# Patient Record
Sex: Male | Born: 1940
Health system: Southern US, Community
[De-identification: ages and names within clinical notes are randomized; demographics above are authoritative.]

## PROBLEM LIST (undated history)

## (undated) DIAGNOSIS — C679 Malignant neoplasm of bladder, unspecified: Secondary | ICD-10-CM

## (undated) DIAGNOSIS — I10 Essential (primary) hypertension: Secondary | ICD-10-CM

## (undated) HISTORY — PX: CATARACT EXTRACTION: SUR2

---

## 2020-07-24 DIAGNOSIS — Z23 Encounter for immunization: Secondary | ICD-10-CM | POA: Diagnosis not present

## 2020-12-02 ENCOUNTER — Ambulatory Visit (INDEPENDENT_AMBULATORY_CARE_PROVIDER_SITE_OTHER): Payer: Medicare Other | Admitting: Physician Assistant

## 2020-12-02 ENCOUNTER — Other Ambulatory Visit: Payer: Self-pay

## 2020-12-02 ENCOUNTER — Encounter: Payer: Self-pay | Admitting: Physician Assistant

## 2020-12-02 VITALS — BP 140/83 | HR 91 | Temp 97.5°F | Ht 67.0 in | Wt 155.8 lb

## 2020-12-02 DIAGNOSIS — R5383 Other fatigue: Secondary | ICD-10-CM | POA: Diagnosis not present

## 2020-12-02 DIAGNOSIS — K219 Gastro-esophageal reflux disease without esophagitis: Secondary | ICD-10-CM

## 2020-12-02 NOTE — Progress Notes (Signed)
  Subjective:     Patient ID: Dean Randolph, male   DOB: 06/01/1941, 80 y.o.   MRN: [id number]  HPI Pt here today after not seeing a provider in 20+ years Sates that around 1 month ago pt noted some stomach sensitivity Certain foods would cause him abd pain and discomfort This got to the point one night that he had sig worsening with assoc fever/chills with N/V/D Those sx resolved the next day and have improved since Now not having any GI sx at all Able to eat all of his foods without any of the prev GI sx at all Only concern is that he is still having some fatigue issues No smoking hx Caff use is 2 cups per day No OTC med use  Review of Systems  Constitutional: Positive for fatigue. Negative for activity change, appetite change and fever.  HENT: Negative.   Respiratory: Negative.   Cardiovascular: Negative.   Gastrointestinal: Negative.   Genitourinary: Negative.        Objective:   Physical Exam Vitals and nursing note reviewed.  Constitutional:      General: He is not in acute distress.    Appearance: He is normal weight. He is not ill-appearing or toxic-appearing.  HENT:     Mouth/Throat:     Mouth: Mucous membranes are moist.     Pharynx: Oropharynx is clear.  Cardiovascular:     Rate and Rhythm: Normal rate and regular rhythm.     Pulses: Normal pulses.     Heart sounds: Normal heart sounds.  Pulmonary:     Effort: Pulmonary effort is normal.     Breath sounds: Normal breath sounds.  Abdominal:     General: Abdomen is flat. There is no distension.     Palpations: Abdomen is soft. There is no mass.     Tenderness: There is no abdominal tenderness. There is no guarding or rebound.  Neurological:     Mental Status: He is alert.    CBC/CMP pending    Assessment:     1. GERD without esophagitis   2. Other fatigue        Plan:     Avoid GI irritants Since still with some fatigue labs ordered today Hydrate well F/U pending labs

## 2020-12-02 NOTE — Patient Instructions (Signed)
Gastroesophageal Reflux Disease, Adult  Gastroesophageal reflux (GER) happens when acid from the stomach flows up into the tube that connects the mouth and the stomach (esophagus). Normally, food travels down the esophagus and stays in the stomach to be digested. With GER, food and stomach acid sometimes move back up into the esophagus. You may have a disease called gastroesophageal reflux disease (GERD) if the reflux:  Happens often.  Causes frequent or very bad symptoms.  Causes problems such as damage to the esophagus. When this happens, the esophagus becomes sore and swollen. Over time, GERD can make small holes (ulcers) in the lining of the esophagus. What are the causes? This condition is caused by a problem with the muscle between the esophagus and the stomach. When this muscle is weak or not normal, it does not close properly to keep food and acid from coming back up from the stomach. The muscle can be weak because of:  Tobacco use.  Pregnancy.  Having a certain type of hernia (hiatal hernia).  Alcohol use.  Certain foods and drinks, such as coffee, chocolate, onions, and peppermint. What increases the risk?  Being overweight.  Having a disease that affects your connective tissue.  Taking NSAIDs, such a ibuprofen. What are the signs or symptoms?  Heartburn.  Difficult or painful swallowing.  The feeling of having a lump in the throat.  A bitter taste in the mouth.  Bad breath.  Having a lot of saliva.  Having an upset or bloated stomach.  Burping.  Chest pain. Different conditions can cause chest pain. Make sure you see your doctor if you have chest pain.  Shortness of breath or wheezing.  A long-term cough or a cough at night.  Wearing away of the surface of teeth (tooth enamel).  Weight loss. How is this treated?  Making changes to your diet.  Taking medicine.  Having surgery. Treatment will depend on how bad your symptoms are. Follow these  instructions at home: Eating and drinking  Follow a diet as told by your doctor. You may need to avoid foods and drinks such as: ? Coffee and tea, with or without caffeine. ? Drinks that contain alcohol. ? Energy drinks and sports drinks. ? Bubbly (carbonated) drinks or sodas. ? Chocolate and cocoa. ? Peppermint and mint flavorings. ? Garlic and onions. ? Horseradish. ? Spicy and acidic foods. These include peppers, chili powder, curry powder, vinegar, hot sauces, and BBQ sauce. ? Citrus fruit juices and citrus fruits, such as oranges, lemons, and limes. ? Tomato-based foods. These include red sauce, chili, salsa, and pizza with red sauce. ? Fried and fatty foods. These include donuts, french fries, potato chips, and high-fat dressings. ? High-fat meats. These include hot dogs, rib eye steak, sausage, ham, and bacon. ? High-fat dairy items, such as whole milk, butter, and cream cheese.  Eat small meals often. Avoid eating large meals.  Avoid drinking large amounts of liquid with your meals.  Avoid eating meals during the 2-3 hours before bedtime.  Avoid lying down right after you eat.  Do not exercise right after you eat.   Lifestyle  Do not smoke or use any products that contain nicotine or tobacco. If you need help quitting, ask your doctor.  Try to lower your stress. If you need help doing this, ask your doctor.  If you are overweight, lose an amount of weight that is healthy for you. Ask your doctor about a safe weight loss goal.   General instructions    Pay attention to any changes in your symptoms.  Take over-the-counter and prescription medicines only as told by your doctor.  Do not take aspirin, ibuprofen, or other NSAIDs unless your doctor says it is okay.  Wear loose clothes. Do not wear anything tight around your waist.  Raise (elevate) the head of your bed about 6 inches (15 cm). You may need to use a wedge to do this.  Avoid bending over if this makes your  symptoms worse.  Keep all follow-up visits. Contact a doctor if:  You have new symptoms.  You lose weight and you do not know why.  You have trouble swallowing or it hurts to swallow.  You have wheezing or a cough that keeps happening.  You have a hoarse voice.  Your symptoms do not get better with treatment. Get help right away if:  You have sudden pain in your arms, neck, jaw, teeth, or back.  You suddenly feel sweaty, dizzy, or light-headed.  You have chest pain or shortness of breath.  You vomit and the vomit is green, yellow, or black, or it looks like blood or coffee grounds.  You faint.  Your poop (stool) is red, bloody, or black.  You cannot swallow, drink, or eat. These symptoms may represent a serious problem that is an emergency. Do not wait to see if the symptoms will go away. Get medical help right away. Call your local emergency services (911 in the [place] Do not drive yourself to the hospital. Summary  If a person has gastroesophageal reflux disease (GERD), food and stomach acid move back up into the esophagus and cause symptoms or problems such as damage to the esophagus.  Treatment will depend on how bad your symptoms are.  Follow a diet as told by your doctor.  Take all medicines only as told by your doctor. This information is not intended to replace advice given to you by your health care provider. Make sure you discuss any questions you have with your health care provider. Document Revised: 12/25/2019 Document Reviewed: 12/25/2019 Elsevier Patient Education  2021 [employer]  

## 2020-12-03 LAB — CBC WITH DIFFERENTIAL/PLATELET
Basophils Absolute: 0.1 10*3/uL (ref 0.0–0.2)
Basos: 1 %
EOS (ABSOLUTE): 0 10*3/uL (ref 0.0–0.4)
Eos: 1 %
Hematocrit: 31 % — ABNORMAL LOW (ref 37.5–51.0)
Hemoglobin: 10.6 g/dL — ABNORMAL LOW (ref 13.0–17.7)
Immature Grans (Abs): 0 10*3/uL (ref 0.0–0.1)
Immature Granulocytes: 0 %
Lymphocytes Absolute: 1.1 10*3/uL (ref 0.7–3.1)
Lymphs: 17 %
MCH: 30.1 pg (ref 26.6–33.0)
MCHC: 34.2 g/dL (ref 31.5–35.7)
MCV: 88 fL (ref 79–97)
Monocytes Absolute: 0.5 10*3/uL (ref 0.1–0.9)
Monocytes: 7 %
NRBC: 2 % — ABNORMAL HIGH (ref 0–0)
Neutrophils Absolute: 4.8 10*3/uL (ref 1.4–7.0)
Neutrophils: 74 %
Platelets: 356 10*3/uL (ref 150–450)
RBC: 3.52 x10E6/uL — ABNORMAL LOW (ref 4.14–5.80)
RDW: 12 % (ref 11.6–15.4)
WBC: 6.5 10*3/uL (ref 3.4–10.8)

## 2020-12-03 LAB — CMP14+EGFR
ALT: 117 IU/L — ABNORMAL HIGH (ref 0–44)
AST: 82 IU/L — ABNORMAL HIGH (ref 0–40)
Albumin/Globulin Ratio: 0.7 — ABNORMAL LOW (ref 1.2–2.2)
Albumin: 3 g/dL — ABNORMAL LOW (ref 3.7–4.7)
Alkaline Phosphatase: 885 IU/L — ABNORMAL HIGH (ref 44–121)
BUN/Creatinine Ratio: 19 (ref 10–24)
BUN: 20 mg/dL (ref 8–27)
Bilirubin Total: 3.7 mg/dL — ABNORMAL HIGH (ref 0.0–1.2)
CO2: 20 mmol/L (ref 20–29)
Calcium: 8.8 mg/dL (ref 8.6–10.2)
Chloride: 99 mmol/L (ref 96–106)
Creatinine, Ser: 1.03 mg/dL (ref 0.76–1.27)
Globulin, Total: 4.6 g/dL — ABNORMAL HIGH (ref 1.5–4.5)
Glucose: 143 mg/dL — ABNORMAL HIGH (ref 65–99)
Potassium: 3.9 mmol/L (ref 3.5–5.2)
Sodium: 135 mmol/L (ref 134–144)
Total Protein: 7.6 g/dL (ref 6.0–8.5)
eGFR: 74 mL/min/{1.73_m2} (ref >59)

## 2020-12-04 ENCOUNTER — Telehealth: Payer: Self-pay | Admitting: Physician Assistant

## 2020-12-04 ENCOUNTER — Other Ambulatory Visit: Payer: Self-pay | Admitting: Family Medicine

## 2020-12-04 DIAGNOSIS — D649 Anemia, unspecified: Secondary | ICD-10-CM

## 2020-12-04 DIAGNOSIS — R7989 Other specified abnormal findings of blood chemistry: Secondary | ICD-10-CM

## 2020-12-04 DIAGNOSIS — R739 Hyperglycemia, unspecified: Secondary | ICD-10-CM

## 2020-12-04 NOTE — Progress Notes (Signed)
Patient's blood sugar is elevated at 143, his liver function is also elevated across the board, I do not know if this is chronic or new because I do not have any labs to compare it to.  Patient's hemoglobin is also slightly down at 10.6 but not significantly, again I do not have any labs to compare to previously.  Placed orders for patient to come back in sometime either at the end of this week or early next week to recheck liver function A1c and hemoglobin Arville Care, MD [facility] 12/04/2020, 12:53 PM

## 2020-12-04 NOTE — Telephone Encounter (Signed)
Please review lab results for patient.  Patient seen Dean Randolph.

## 2020-12-11 ENCOUNTER — Other Ambulatory Visit: Payer: Self-pay

## 2020-12-11 ENCOUNTER — Other Ambulatory Visit: Payer: Medicare Other

## 2020-12-11 DIAGNOSIS — R739 Hyperglycemia, unspecified: Secondary | ICD-10-CM

## 2020-12-11 DIAGNOSIS — D649 Anemia, unspecified: Secondary | ICD-10-CM

## 2020-12-11 DIAGNOSIS — R7989 Other specified abnormal findings of blood chemistry: Secondary | ICD-10-CM | POA: Diagnosis not present

## 2020-12-11 LAB — BAYER DCA HB A1C WAIVED: HB A1C (BAYER DCA - WAIVED): 5.3 % (ref <7.0)

## 2020-12-12 LAB — HEPATIC FUNCTION PANEL
ALT: 101 IU/L — ABNORMAL HIGH (ref 0–44)
AST: 107 IU/L — ABNORMAL HIGH (ref 0–40)
Albumin: 3.7 g/dL (ref 3.7–4.7)
Alkaline Phosphatase: 674 IU/L — ABNORMAL HIGH (ref 44–121)
Bilirubin Total: 1.6 mg/dL — ABNORMAL HIGH (ref 0.0–1.2)
Bilirubin, Direct: 1.1 mg/dL — ABNORMAL HIGH (ref 0.00–0.40)
Total Protein: 7.7 g/dL (ref 6.0–8.5)

## 2020-12-12 LAB — CBC WITH DIFFERENTIAL/PLATELET
Basophils Absolute: 0.1 10*3/uL (ref 0.0–0.2)
Basos: 1 %
EOS (ABSOLUTE): 0.2 10*3/uL (ref 0.0–0.4)
Eos: 2 %
Hematocrit: 36.7 % — ABNORMAL LOW (ref 37.5–51.0)
Hemoglobin: 11.9 g/dL — ABNORMAL LOW (ref 13.0–17.7)
Immature Grans (Abs): 0 10*3/uL (ref 0.0–0.1)
Immature Granulocytes: 0 %
Lymphocytes Absolute: 1.8 10*3/uL (ref 0.7–3.1)
Lymphs: 26 %
MCH: 30.5 pg (ref 26.6–33.0)
MCHC: 32.4 g/dL (ref 31.5–35.7)
MCV: 94 fL (ref 79–97)
Monocytes Absolute: 0.5 10*3/uL (ref 0.1–0.9)
Monocytes: 7 %
Neutrophils Absolute: 4.4 10*3/uL (ref 1.4–7.0)
Neutrophils: 64 %
Platelets: 335 10*3/uL (ref 150–450)
RBC: 3.9 x10E6/uL — ABNORMAL LOW (ref 4.14–5.80)
RDW: 13.6 % (ref 11.6–15.4)
WBC: 7 10*3/uL (ref 3.4–10.8)

## 2020-12-19 ENCOUNTER — Other Ambulatory Visit: Payer: Self-pay

## 2020-12-19 DIAGNOSIS — R7989 Other specified abnormal findings of blood chemistry: Secondary | ICD-10-CM

## 2020-12-31 ENCOUNTER — Ambulatory Visit (HOSPITAL_COMMUNITY)
Admission: RE | Admit: 2020-12-31 | Discharge: 2020-12-31 | Disposition: A | Discharge status: Home / Self Care | Payer: Medicare Other | Source: Ambulatory Visit | Attending: Family Medicine | Admitting: Family Medicine

## 2020-12-31 DIAGNOSIS — K760 Fatty (change of) liver, not elsewhere classified: Secondary | ICD-10-CM | POA: Diagnosis not present

## 2020-12-31 DIAGNOSIS — R7989 Other specified abnormal findings of blood chemistry: Secondary | ICD-10-CM | POA: Insufficient documentation

## 2021-01-27 ENCOUNTER — Telehealth: Payer: Self-pay | Admitting: Family Medicine

## 2021-01-27 NOTE — Telephone Encounter (Signed)
Spoke with patient's daughter and explained to her that I could not go over any medical details of her dad's chart because she was not on his HIPPA form.  Daughter explains that she has spoken with her father and he is wanting to go to GI doctor or whatever he needs to do.  She feels that he is getting worse and needs to be seen as quickly as possible.  Daughter would like referral placed to whatever he needs STAT.

## 2021-01-28 ENCOUNTER — Other Ambulatory Visit: Payer: Self-pay | Admitting: Family Medicine

## 2021-01-28 DIAGNOSIS — R7989 Other specified abnormal findings of blood chemistry: Secondary | ICD-10-CM

## 2021-01-28 NOTE — Telephone Encounter (Signed)
Patient came in to the office to discuss his referral with gastroenterologist.  I explained to patient that he would be contacted by the gastroenterologist office with an appointment.  Patient states he would like to go to [place].    Patient also updated his DPR to include his daughter, Dorian Heckle.

## 2021-01-29 ENCOUNTER — Encounter: Payer: Self-pay | Admitting: Internal Medicine

## 2021-01-29 NOTE — Telephone Encounter (Signed)
Referral placed, as requested WS 

## 2021-02-02 NOTE — Addendum Note (Signed)
Encounter addended by: Novella Olive on: 02/02/2021 12:26 PM  Actions taken: Letter saved

## 2021-02-04 ENCOUNTER — Encounter (HOSPITAL_COMMUNITY): Payer: Self-pay

## 2021-02-04 ENCOUNTER — Emergency Department (HOSPITAL_COMMUNITY): Payer: Medicare Other

## 2021-02-04 ENCOUNTER — Other Ambulatory Visit: Payer: Self-pay

## 2021-02-04 ENCOUNTER — Inpatient Hospital Stay (HOSPITAL_COMMUNITY)
Admission: EM | Admit: 2021-02-04 | Discharge: 2021-02-07 | DRG: 445 | Disposition: A | Discharge status: Home / Self Care | Payer: Medicare Other | Attending: Internal Medicine | Admitting: Internal Medicine

## 2021-02-04 DIAGNOSIS — K8031 Calculus of bile duct with cholangitis, unspecified, with obstruction: Secondary | ICD-10-CM | POA: Diagnosis not present

## 2021-02-04 DIAGNOSIS — R634 Abnormal weight loss: Secondary | ICD-10-CM | POA: Diagnosis not present

## 2021-02-04 DIAGNOSIS — R531 Weakness: Secondary | ICD-10-CM | POA: Diagnosis not present

## 2021-02-04 DIAGNOSIS — K760 Fatty (change of) liver, not elsewhere classified: Secondary | ICD-10-CM | POA: Diagnosis not present

## 2021-02-04 DIAGNOSIS — K838 Other specified diseases of biliary tract: Secondary | ICD-10-CM | POA: Diagnosis not present

## 2021-02-04 DIAGNOSIS — D539 Nutritional anemia, unspecified: Secondary | ICD-10-CM | POA: Diagnosis present

## 2021-02-04 DIAGNOSIS — N281 Cyst of kidney, acquired: Secondary | ICD-10-CM | POA: Diagnosis not present

## 2021-02-04 DIAGNOSIS — K8309 Other cholangitis: Secondary | ICD-10-CM

## 2021-02-04 DIAGNOSIS — K8051 Calculus of bile duct without cholangitis or cholecystitis with obstruction: Secondary | ICD-10-CM | POA: Diagnosis not present

## 2021-02-04 DIAGNOSIS — K8071 Calculus of gallbladder and bile duct without cholecystitis with obstruction: Principal | ICD-10-CM | POA: Diagnosis present

## 2021-02-04 DIAGNOSIS — R109 Unspecified abdominal pain: Secondary | ICD-10-CM | POA: Diagnosis not present

## 2021-02-04 DIAGNOSIS — K920 Hematemesis: Secondary | ICD-10-CM | POA: Diagnosis not present

## 2021-02-04 DIAGNOSIS — R748 Abnormal levels of other serum enzymes: Secondary | ICD-10-CM | POA: Diagnosis not present

## 2021-02-04 DIAGNOSIS — R6881 Early satiety: Secondary | ICD-10-CM | POA: Diagnosis present

## 2021-02-04 DIAGNOSIS — Z20822 Contact with and (suspected) exposure to covid-19: Secondary | ICD-10-CM | POA: Diagnosis not present

## 2021-02-04 DIAGNOSIS — D509 Iron deficiency anemia, unspecified: Secondary | ICD-10-CM | POA: Diagnosis not present

## 2021-02-04 DIAGNOSIS — R7401 Elevation of levels of liver transaminase levels: Secondary | ICD-10-CM | POA: Insufficient documentation

## 2021-02-04 DIAGNOSIS — I10 Essential (primary) hypertension: Secondary | ICD-10-CM

## 2021-02-04 DIAGNOSIS — K8050 Calculus of bile duct without cholangitis or cholecystitis without obstruction: Secondary | ICD-10-CM | POA: Diagnosis not present

## 2021-02-04 DIAGNOSIS — R739 Hyperglycemia, unspecified: Secondary | ICD-10-CM | POA: Diagnosis not present

## 2021-02-04 DIAGNOSIS — K8020 Calculus of gallbladder without cholecystitis without obstruction: Secondary | ICD-10-CM

## 2021-02-04 DIAGNOSIS — R52 Pain, unspecified: Secondary | ICD-10-CM

## 2021-02-04 DIAGNOSIS — R17 Unspecified jaundice: Secondary | ICD-10-CM | POA: Diagnosis not present

## 2021-02-04 LAB — CBC WITH DIFFERENTIAL/PLATELET
Abs Immature Granulocytes: 0.03 10*3/uL (ref 0.00–0.07)
Basophils Absolute: 0 10*3/uL (ref 0.0–0.1)
Basophils Relative: 0 %
Eosinophils Absolute: 0 10*3/uL (ref 0.0–0.5)
Eosinophils Relative: 0 %
HCT: 33.6 % — ABNORMAL LOW (ref 39.0–52.0)
Hemoglobin: 12.4 g/dL — ABNORMAL LOW (ref 13.0–17.0)
Immature Granulocytes: 0 %
Lymphocytes Relative: 8 %
Lymphs Abs: 0.8 10*3/uL (ref 0.7–4.0)
MCH: 39.5 pg — ABNORMAL HIGH (ref 26.0–34.0)
MCHC: 36.9 g/dL — ABNORMAL HIGH (ref 30.0–36.0)
MCV: 107 fL — ABNORMAL HIGH (ref 80.0–100.0)
Monocytes Absolute: 0.7 10*3/uL (ref 0.1–1.0)
Monocytes Relative: 7 %
Neutro Abs: 8.2 10*3/uL — ABNORMAL HIGH (ref 1.7–7.7)
Neutrophils Relative %: 85 %
Platelets: 223 10*3/uL (ref 150–400)
RBC: 3.14 MIL/uL — ABNORMAL LOW (ref 4.22–5.81)
RDW: 18.6 % — ABNORMAL HIGH (ref 11.5–15.5)
WBC: 9.7 10*3/uL (ref 4.0–10.5)
nRBC: 0 % (ref 0.0–0.2)

## 2021-02-04 LAB — IRON AND TIBC
Iron: 15 ug/dL — ABNORMAL LOW (ref 45–182)
Saturation Ratios: 6 % — ABNORMAL LOW (ref 17.9–39.5)
TIBC: 264 ug/dL (ref 250–450)
UIBC: 249 ug/dL

## 2021-02-04 LAB — COMPREHENSIVE METABOLIC PANEL
ALT: 192 U/L — ABNORMAL HIGH (ref 0–44)
AST: 273 U/L — ABNORMAL HIGH (ref 15–41)
Albumin: 2.9 g/dL — ABNORMAL LOW (ref 3.5–5.0)
Alkaline Phosphatase: 740 U/L — ABNORMAL HIGH (ref 38–126)
Anion gap: 7 (ref 5–15)
BUN: 20 mg/dL (ref 8–23)
CO2: 27 mmol/L (ref 22–32)
Calcium: 8.8 mg/dL — ABNORMAL LOW (ref 8.9–10.3)
Chloride: 101 mmol/L (ref 98–111)
Creatinine, Ser: 0.82 mg/dL (ref 0.61–1.24)
GFR, Estimated: >60 mL/min (ref >60)
Glucose, Bld: 138 mg/dL — ABNORMAL HIGH (ref 70–99)
Potassium: 3.6 mmol/L (ref 3.5–5.1)
Sodium: 135 mmol/L (ref 135–145)
Total Bilirubin: 7.4 mg/dL — ABNORMAL HIGH (ref 0.3–1.2)
Total Protein: 7.9 g/dL (ref 6.5–8.1)

## 2021-02-04 LAB — RESP PANEL BY RT-PCR (FLU A&B, COVID) ARPGX2
Influenza A by PCR: NEGATIVE
Influenza B by PCR: NEGATIVE
SARS Coronavirus 2 by RT PCR: NEGATIVE

## 2021-02-04 LAB — FERRITIN: Ferritin: 275 ng/mL (ref 24–336)

## 2021-02-04 LAB — PROTIME-INR
INR: 1 (ref 0.8–1.2)
Prothrombin Time: 13.5 seconds (ref 11.4–15.2)

## 2021-02-04 LAB — FOLATE: Folate: 17.4 ng/mL (ref >5.9)

## 2021-02-04 LAB — LIPASE, BLOOD: Lipase: 24 U/L (ref 11–51)

## 2021-02-04 LAB — RETICULOCYTES
Immature Retic Fract: 10.6 % (ref 2.3–15.9)
RBC.: 3.73 MIL/uL — ABNORMAL LOW (ref 4.22–5.81)
Retic Count, Absolute: 71.6 10*3/uL (ref 19.0–186.0)
Retic Ct Pct: 1.9 % (ref 0.4–3.1)

## 2021-02-04 LAB — VITAMIN B12: Vitamin B-12: 356 pg/mL (ref 180–914)

## 2021-02-04 MED ORDER — ONDANSETRON HCL 4 MG/2ML IJ SOLN: 4.0000 mg | Freq: Four times a day (QID) | INTRAMUSCULAR | Status: DC | PRN

## 2021-02-04 MED ORDER — GADOBUTROL 1 MMOL/ML IV SOLN
7.0000 mL | Freq: Once | INTRAVENOUS | Status: AC | PRN
  Administered 2021-02-04: 7 mL via INTRAVENOUS

## 2021-02-04 MED ORDER — PANTOPRAZOLE SODIUM 40 MG IV SOLR
40.0000 mg | INTRAVENOUS | Status: DC
  Administered 2021-02-04: 40 mg via INTRAVENOUS
  Filled 2021-02-04: qty 40

## 2021-02-04 MED ORDER — ONDANSETRON HCL 4 MG PO TABS: 4.0000 mg | ORAL_TABLET | Freq: Four times a day (QID) | ORAL | Status: DC | PRN

## 2021-02-04 MED ORDER — BOOST / RESOURCE BREEZE PO LIQD CUSTOM
1.0000 | Freq: Three times a day (TID) | ORAL | Status: DC
  Administered 2021-02-04 – 2021-02-07 (×5): 1 via ORAL

## 2021-02-04 MED ORDER — ACETAMINOPHEN 325 MG PO TABS: 650.0000 mg | ORAL_TABLET | Freq: Four times a day (QID) | ORAL | Status: DC | PRN

## 2021-02-04 MED ORDER — SODIUM CHLORIDE 0.9 % IV SOLN: INTRAVENOUS | Status: DC

## 2021-02-04 MED ORDER — FENTANYL CITRATE PF 50 MCG/ML IJ SOSY: 12.5000 ug | PREFILLED_SYRINGE | INTRAMUSCULAR | Status: DC | PRN

## 2021-02-04 MED ORDER — ACETAMINOPHEN 650 MG RE SUPP: 650.0000 mg | Freq: Four times a day (QID) | RECTAL | Status: DC | PRN

## 2021-02-04 NOTE — ED Provider Notes (Signed)
[facility] EMERGENCY DEPARTMENT Provider Note  CSN: [id number] Arrival date & time: 02/04/21 1884    History Chief Complaint  Patient presents with   Hematemesis    FARAH BENISH is a 80 y.o. male with no known medical problems went to see a PCP for the first time in 20+ years on June 6 this year for intermittent abdominal pain and general weakness. He had labs done showing mild anemia and elevated liver enzymes. Labs were rechecked a few weeks later with minimal change and he was sent for an [place] on July 5 showing dilated CBD. He was referred to GI but his appointment is not until November. He presents today for evaluation because he vomited again once last night with small amount of blood mixed with food. He reports poor appetite and weight loss. He denies any hematochezia or melena. Does not take medications. Never used EtOH or tobacco.    History reviewed. No pertinent past medical history.  History reviewed. No pertinent surgical history.  History reviewed. No pertinent family history.  Social History   Tobacco Use   Smoking status: Never    Passive exposure: Past   Smokeless tobacco: Never  Substance Use Topics   Alcohol use: Never   Drug use: Never     Home Medications Prior to Admission medications   Medication Sig Start Date End Date Taking? Authorizing Provider  acetaminophen (TYLENOL) 325 MG tablet Take 650 mg by mouth every 6 (six) hours as needed for headache or mild pain.   Yes [provider]     Allergies    Patient has no known allergies.   Review of Systems   Review of Systems A comprehensive review of systems was completed and negative except as noted in HPI.    Physical Exam BP 116/66   Pulse 76   Temp 97.7 F (36.5 C) (Oral)   Resp 18   Ht 5\' 7"  (1.702 m)   Wt 74.8 kg   SpO2 98%   BMI 25.84 kg/m   Physical Exam Vitals and nursing note reviewed.  Constitutional:      Appearance: Normal appearance.  HENT:     Head:  Normocephalic and atraumatic.     Nose: Nose normal.     Mouth/Throat:     Mouth: Mucous membranes are moist.  Eyes:     General: Scleral icterus (mild) present.     Extraocular Movements: Extraocular movements intact.     Conjunctiva/sclera: Conjunctivae normal.  Cardiovascular:     Rate and Rhythm: Normal rate.  Pulmonary:     Effort: Pulmonary effort is normal.     Breath sounds: Normal breath sounds.  Abdominal:     General: Abdomen is flat.     Palpations: Abdomen is soft.     Tenderness: There is no abdominal tenderness.  Musculoskeletal:        General: No swelling. Normal range of motion.     Cervical back: Neck supple.  Skin:    General: Skin is warm and dry.  Neurological:     General: No focal deficit present.     Mental Status: He is alert.  Psychiatric:        Mood and Affect: Mood normal.     ED Results / Procedures / Treatments   Labs (all labs ordered are listed, but only abnormal results are displayed) Labs Reviewed  COMPREHENSIVE METABOLIC PANEL - Abnormal; Notable for the following components:      Result Value  Glucose, Bld 138 (*)    Calcium 8.8 (*)    Albumin 2.9 (*)    AST 273 (*)    ALT 192 (*)    Alkaline Phosphatase 740 (*)    Total Bilirubin 7.4 (*)    All other components within normal limits  CBC WITH DIFFERENTIAL/PLATELET - Abnormal; Notable for the following components:   RBC 3.14 (*)    Hemoglobin 12.4 (*)    HCT 33.6 (*)    MCV 107.0 (*)    MCH 39.5 (*)    MCHC 36.9 (*)    RDW 18.6 (*)    Neutro Abs 8.2 (*)    All other components within normal limits  IRON AND TIBC - Abnormal; Notable for the following components:   Iron 15 (*)    Saturation Ratios 6 (*)    All other components within normal limits  RETICULOCYTES - Abnormal; Notable for the following components:   RBC. 3.73 (*)    All other components within normal limits  RESP PANEL BY RT-PCR (FLU A&B, COVID) ARPGX2  LIPASE, BLOOD  PROTIME-INR  VITAMIN B12  FOLATE   FERRITIN  TYPE AND SCREEN    EKG None  Radiology MR 3D Recon At Scanner  Result Date: 02/04/2021 CLINICAL DATA:  Jaundice, upper abdominal pain, abnormal ultrasound, malignancy suspected EXAM: MRI ABDOMEN WITHOUT AND WITH CONTRAST (INCLUDING MRCP) TECHNIQUE: Multiplanar multisequence MR imaging of the abdomen was performed both before and after the administration of intravenous contrast. Heavily T2-weighted images of the biliary and pancreatic ducts were obtained, and three-dimensional MRCP images were rendered by post processing. CONTRAST:  7mL GADAVIST GADOBUTROL 1 MMOL/ML IV SOLN COMPARISON:  Right upper quadrant ultrasound, 12/31/2020 FINDINGS: Examination is generally somewhat limited by breath motion artifact, particularly on contrast enhanced sequences. Lower chest: No acute findings. Hepatobiliary: No mass or other parenchymal abnormality identified. Relatively contracted gallbladder containing multiple gallstones. There is gross intra and extrahepatic biliary ductal dilatation, the common bile duct measuring up to 2.1 cm centrally (series 3, image 16) with numerous gallstones (>10) of varying sizes present in the pancreatic portion of the common bile duct, the largest near the ampulla measuring at least 1.3 cm (series 3, image 16). Pancreas: No mass, inflammatory changes, or other parenchymal abnormality identified. No pancreatic ductal dilatation. Spleen:  Within normal limits in size and appearance. Adrenals/Urinary Tract: No masses identified. Simple, benign bilateral renal cortical and parapelvic cysts. No evidence of hydronephrosis. Stomach/Bowel: Visualized portions within the abdomen are unremarkable. Vascular/Lymphatic: No pathologically enlarged lymph nodes identified. No abdominal aortic aneurysm demonstrated. Other:  None. Musculoskeletal: No suspicious bone lesions identified. IMPRESSION: 1. There is gross intra and extrahepatic biliary ductal dilatation, the common bile duct  measuring up to 2.1 cm centrally with numerous gallstones (>10) present in the pancreatic portion of the common bile duct, the largest near the ampulla measuring at least 1.3 cm. Findings are consistent with choledocholithiasis. 2. Relatively contracted gallbladder containing multiple additional gallstones. Electronically Signed   By: Lauralyn PrimesAlex  Bibbey M.D.   On: 02/04/2021 11:35   MR ABDOMEN MRCP W WO CONTAST  Result Date: 02/04/2021 CLINICAL DATA:  Jaundice, upper abdominal pain, abnormal ultrasound, malignancy suspected EXAM: MRI ABDOMEN WITHOUT AND WITH CONTRAST (INCLUDING MRCP) TECHNIQUE: Multiplanar multisequence MR imaging of the abdomen was performed both before and after the administration of intravenous contrast. Heavily T2-weighted images of the biliary and pancreatic ducts were obtained, and three-dimensional MRCP images were rendered by post processing. CONTRAST:  7mL GADAVIST GADOBUTROL 1 MMOL/ML IV  SOLN COMPARISON:  Right upper quadrant ultrasound, 12/31/2020 FINDINGS: Examination is generally somewhat limited by breath motion artifact, particularly on contrast enhanced sequences. Lower chest: No acute findings. Hepatobiliary: No mass or other parenchymal abnormality identified. Relatively contracted gallbladder containing multiple gallstones. There is gross intra and extrahepatic biliary ductal dilatation, the common bile duct measuring up to 2.1 cm centrally (series 3, image 16) with numerous gallstones (>10) of varying sizes present in the pancreatic portion of the common bile duct, the largest near the ampulla measuring at least 1.3 cm (series 3, image 16). Pancreas: No mass, inflammatory changes, or other parenchymal abnormality identified. No pancreatic ductal dilatation. Spleen:  Within normal limits in size and appearance. Adrenals/Urinary Tract: No masses identified. Simple, benign bilateral renal cortical and parapelvic cysts. No evidence of hydronephrosis. Stomach/Bowel: Visualized portions  within the abdomen are unremarkable. Vascular/Lymphatic: No pathologically enlarged lymph nodes identified. No abdominal aortic aneurysm demonstrated. Other:  None. Musculoskeletal: No suspicious bone lesions identified. IMPRESSION: 1. There is gross intra and extrahepatic biliary ductal dilatation, the common bile duct measuring up to 2.1 cm centrally with numerous gallstones (>10) present in the pancreatic portion of the common bile duct, the largest near the ampulla measuring at least 1.3 cm. Findings are consistent with choledocholithiasis. 2. Relatively contracted gallbladder containing multiple additional gallstones. Electronically Signed   By: Lauralyn Primes M.D.   On: 02/04/2021 11:35    Procedures Procedures  Medications Ordered in the ED Medications  0.9 %  sodium chloride infusion (has no administration in time range)  acetaminophen (TYLENOL) tablet 650 mg (has no administration in time range)    Or  acetaminophen (TYLENOL) suppository 650 mg (has no administration in time range)  fentaNYL (SUBLIMAZE) injection 12.5 mcg (has no administration in time range)  ondansetron (ZOFRAN) tablet 4 mg (has no administration in time range)    Or  ondansetron (ZOFRAN) injection 4 mg (has no administration in time range)  pantoprazole (PROTONIX) injection 40 mg (has no administration in time range)  gadobutrol (GADAVIST) 1 MMOL/ML injection 7 mL (7 mLs Intravenous Contrast Given 02/04/21 1107)     MDM Rules/Calculators/A&P MDM Patient with occasional vomiting over the last 2 months, has had abnormal [place] but not yet been able to see GI. Will check labs today to compare to previous. Exam is benign at present.   ED Course  I have reviewed the triage vital signs and the nursing notes.  Pertinent labs & imaging results that were available during my care of the patient were reviewed by me and considered in my medical decision making (see chart for details).  Clinical Course as of 02/04/21 1441  Tue  Feb 04, 2021  0923 CBC with mild anemia, improved from previous.  [CS]  0936 INR is normal, lipase is normal. CMP with worsening liver function tests including transaminases, ALP and TBili. Will discuss with GI.  [CS]  1014 Spoke with Dr. Levon Hedger, GI, who requests MRCP and disposition will be based on those results.  [CS]  1140 MRCP results reviewed, several large CBD stones. Will discuss with Dr. Levon Hedger.  [CS]  1216 Spoke again with Dr. Levon Hedger, who recommends the patient be admitted for ERCP. Hospitalist paged.  [CS]  1231 Spoke with Dr. Maryln Manuel, Hospitalist, who will evaluate for admission.  [CS]    Clinical Course User Index [CS] Pollyann Savoy, MD    Final Clinical Impression(s) / ED Diagnoses Final diagnoses:  Choledocholithiasis    Rx / DC Orders ED Discharge Orders  None        Pollyann Savoy, MD 02/04/21 [phone]

## 2021-02-04 NOTE — Consult Note (Signed)
_0 @   Referring Provider: Calvert Cantor, MD Primary Care Physician:  Claretta Fraise, MD Primary Gastroenterologist: None. Pending first office visit with [facility] gastroenterology.   Date of Admission: 02/04/2021 Date of Consultation: 02/04/2021  Reason for Consultation: Elevated LFTs, elevated bilirubin, choledocholithiasis  HPI:  Dean Randolph is a 80 y.o. year old male with no significant past medical history who presented to the emergency room this morning due to recurrent nausea with vomiting with emesis containing a couple streaks of blood last night.  ED course: Hemodynamically stable and afebrile. Labs remarkable for hemoglobin 12.4 with microcytic indices, WBC 9.7, platelets 233, sodium and potassium within normal limits, glucose elevated at 138, albumin 2.9, AST 273, ALT 192, alk phos 740, total bilirubin 7.4.  Lipase within normal limits.  INR normal. Vitamin B-12 356, folate 17.4, ferritin 275, iron low at 15, saturation low at 6%. COVID-negative.  MRI/MRCP with gross intra and extrahepatic biliary ductal dilation, CBD measuring up to 2.1 cm with numerous gallstones (>10) in the pancreatic portion of CBD, largest near ampulla measuring 1.3 cm.  Relatively contracted gallbladder containing multiple additional gallstones.  Abdominal ultrasound 12/31/2020: CBD dilated at 12.5 mm, nodular asymmetric wall thickening of gallbladder with scattered echogenic foci throughout the wall and some areas of possible comet tail artifact, findings nonspecific and could reflect adenomyomatosis or neoplasm.  Diffusely increased parenchymal echogenicity of the liver.  Today:  Patient reports 4-6 week history of intermittent postprandial nausea and vomiting, commonly triggered by fatty/greasy meals.  No longer able to eat ice cream.  Yesterday, he had pork chopping corn which resulted in recurrent nausea and vomiting.  Emesis contained a few streaks of red blood which was new for him.  This is  what prompted him to come to the emergency room.  Denies nausea or vomiting today.  He is tolerating clear liquids well at this time.  Denies any associated abdominal pain aside from mild abdominal discomfort after vomiting.  Denies BRBPR or melena.  Occasional reflux symptoms, but nothing significant.  Denies dysphagia.  Denies constipation or diarrhea.  Thought he may have had a fever last night, but he did not check his temperature.  This resolved on its own without Tylenol or ibuprofen.  He has had some associated weakness and possibly some weight loss, but unable to quantify.  No prior EGD or colonoscopy.  States he is not interested in ever having a colonoscopy as he has had to close friends who had very poor outcomes following colonoscopies due to complications.  Denies NSAID use, alcohol, drug use, or tobacco use.  Denies any outpatient medications aside from eyedrops.  History reviewed. No pertinent past medical history.  History reviewed. No pertinent surgical history.  Prior to Admission medications   Medication Sig Start Date End Date Taking? Authorizing Provider  acetaminophen (TYLENOL) 325 MG tablet Take 650 mg by mouth every 6 (six) hours as needed for headache or mild pain.   Yes [provider]    Current Facility-Administered Medications  Medication Dose Route Frequency Provider Last Rate Last Admin   0.9 %  sodium chloride infusion   Intravenous Continuous Johnson, Clanford L, MD       acetaminophen (TYLENOL) tablet 650 mg  650 mg Oral Q6H PRN Johnson, Clanford L, MD       Or   acetaminophen (TYLENOL) suppository 650 mg  650 mg Rectal Q6H PRN Johnson, Clanford L, MD       fentaNYL (SUBLIMAZE) injection 12.5 mcg  12.5 mcg  Intravenous Q2H PRN Johnson, Clanford L, MD       ondansetron (ZOFRAN) tablet 4 mg  4 mg Oral Q6H PRN Johnson, Clanford L, MD       Or   ondansetron (ZOFRAN) injection 4 mg  4 mg Intravenous Q6H PRN Johnson, Clanford L, MD       pantoprazole  (PROTONIX) injection 40 mg  40 mg Intravenous Q24H Johnson, Clanford L, MD       Current Outpatient Medications  Medication Sig Dispense Refill   acetaminophen (TYLENOL) 325 MG tablet Take 650 mg by mouth every 6 (six) hours as needed for headache or mild pain.      Allergies as of 02/04/2021   (No Known Allergies)    History reviewed. No pertinent family history.  Social History   Socioeconomic History   Marital status: Married    Spouse name: Not on file   Number of children: Not on file   Years of education: Not on file   Highest education level: Not on file  Occupational History   Occupation: electrician    Comment: retired  Tobacco Use   Smoking status: Never    Passive exposure: Past   Smokeless tobacco: Never  Substance and Sexual Activity   Alcohol use: Never   Drug use: Never   Sexual activity: Not on file  Other Topics Concern   Not on file  Social History Narrative   Not on file   Social Determinants of Health   Financial Resource Strain: Not on file  Food Insecurity: Not on file  Transportation Needs: Not on file  Physical Activity: Not on file  Stress: Not on file  Social Connections: Not on file  Intimate Partner Violence: Not on file    Review of Systems: Gen: Denies cold or flulike symptoms, presyncope, syncope. CV: Denies chest pain or heart palpitations. Resp: Denies shortness of breath or cough. GI: See HPI GU : Denies urinary burning, urinary frequency, urinary incontinence.  MS: Denies joint pain Derm: Denies rash Psych: Denies depression, anxiety Heme: See HPI  Physical Exam: Vital signs in last 24 hours: Temp:  [97.7 F (36.5 C)] 97.7 F (36.5 C) (08/09 0851) Pulse Rate:  [72-80] 76 (08/09 1100) Resp:  [16-23] 18 (08/09 1100) BP: (113-142)/(63-98) 116/66 (08/09 1100) SpO2:  [96 %-100 %] 98 % (08/09 1100) Weight:  [74.8 kg] 74.8 kg (08/09 0851)   General:   Alert,  Well-developed, well-nourished, pleasant and cooperative in  NAD Head:  Normocephalic and atraumatic. Eyes:  + Scleral icterus. Conjunctiva pink. Ears:  Normal auditory acuity. Lungs:  Clear throughout to auscultation.   Minimal wheeze in the left upper lung field, no crackles, or rhonchi. No acute distress. Heart:  Regular rate and rhythm; no murmurs, clicks, rubs,  or gallops. Abdomen:  Soft, nontender and nondistended. No masses, hepatosplenomegaly or hernias noted. Normal bowel sounds, without guarding, and without rebound.   Rectal:  Deferred Msk:  Symmetrical without gross deformities. Normal posture. Extremities:  Without edema. Neurologic:  Alert and  oriented x4;  grossly normal neurologically. Skin:  Intact without significant lesions or rashes. Psych: Normal mood and affect.  Intake/Output from previous day: No intake/output data recorded. Intake/Output this shift: No intake/output data recorded.  Lab Results: Recent Labs    02/04/21 0912  WBC 9.7  HGB 12.4*  HCT 33.6*  PLT 223   BMET Recent Labs    02/04/21 0912  NA 135  K 3.6  CL 101  CO2 27  GLUCOSE  138*  BUN 20  CREATININE 0.82  CALCIUM 8.8*   LFT Recent Labs    02/04/21 0912  PROT 7.9  ALBUMIN 2.9*  AST 273*  ALT 192*  ALKPHOS 740*  BILITOT 7.4*   PT/INR Recent Labs    02/04/21 0912  LABPROT 13.5  INR 1.0    Studies/Results: MR 3D Recon At Scanner  Result Date: 02/04/2021 CLINICAL DATA:  Jaundice, upper abdominal pain, abnormal ultrasound, malignancy suspected EXAM: MRI ABDOMEN WITHOUT AND WITH CONTRAST (INCLUDING MRCP) TECHNIQUE: Multiplanar multisequence MR imaging of the abdomen was performed both before and after the administration of intravenous contrast. Heavily T2-weighted images of the biliary and pancreatic ducts were obtained, and three-dimensional MRCP images were rendered by post processing. CONTRAST:  33m GADAVIST GADOBUTROL 1 MMOL/ML IV SOLN COMPARISON:  Right upper quadrant ultrasound, 12/31/2020 FINDINGS: Examination is generally  somewhat limited by breath motion artifact, particularly on contrast enhanced sequences. Lower chest: No acute findings. Hepatobiliary: No mass or other parenchymal abnormality identified. Relatively contracted gallbladder containing multiple gallstones. There is gross intra and extrahepatic biliary ductal dilatation, the common bile duct measuring up to 2.1 cm centrally (series 3, image 16) with numerous gallstones (>10) of varying sizes present in the pancreatic portion of the common bile duct, the largest near the ampulla measuring at least 1.3 cm (series 3, image 16). Pancreas: No mass, inflammatory changes, or other parenchymal abnormality identified. No pancreatic ductal dilatation. Spleen:  Within normal limits in size and appearance. Adrenals/Urinary Tract: No masses identified. Simple, benign bilateral renal cortical and parapelvic cysts. No evidence of hydronephrosis. Stomach/Bowel: Visualized portions within the abdomen are unremarkable. Vascular/Lymphatic: No pathologically enlarged lymph nodes identified. No abdominal aortic aneurysm demonstrated. Other:  None. Musculoskeletal: No suspicious bone lesions identified. IMPRESSION: 1. There is gross intra and extrahepatic biliary ductal dilatation, the common bile duct measuring up to 2.1 cm centrally with numerous gallstones (>10) present in the pancreatic portion of the common bile duct, the largest near the ampulla measuring at least 1.3 cm. Findings are consistent with choledocholithiasis. 2. Relatively contracted gallbladder containing multiple additional gallstones. Electronically Signed   By: AEddie CandleM.D.   On: 02/04/2021 11:35   MR ABDOMEN MRCP W WO CONTAST  Result Date: 02/04/2021 CLINICAL DATA:  Jaundice, upper abdominal pain, abnormal ultrasound, malignancy suspected EXAM: MRI ABDOMEN WITHOUT AND WITH CONTRAST (INCLUDING MRCP) TECHNIQUE: Multiplanar multisequence MR imaging of the abdomen was performed both before and after the  administration of intravenous contrast. Heavily T2-weighted images of the biliary and pancreatic ducts were obtained, and three-dimensional MRCP images were rendered by post processing. CONTRAST:  761mGADAVIST GADOBUTROL 1 MMOL/ML IV SOLN COMPARISON:  Right upper quadrant ultrasound, 12/31/2020 FINDINGS: Examination is generally somewhat limited by breath motion artifact, particularly on contrast enhanced sequences. Lower chest: No acute findings. Hepatobiliary: No mass or other parenchymal abnormality identified. Relatively contracted gallbladder containing multiple gallstones. There is gross intra and extrahepatic biliary ductal dilatation, the common bile duct measuring up to 2.1 cm centrally (series 3, image 16) with numerous gallstones (>10) of varying sizes present in the pancreatic portion of the common bile duct, the largest near the ampulla measuring at least 1.3 cm (series 3, image 16). Pancreas: No mass, inflammatory changes, or other parenchymal abnormality identified. No pancreatic ductal dilatation. Spleen:  Within normal limits in size and appearance. Adrenals/Urinary Tract: No masses identified. Simple, benign bilateral renal cortical and parapelvic cysts. No evidence of hydronephrosis. Stomach/Bowel: Visualized portions within the abdomen are unremarkable. Vascular/Lymphatic: No pathologically enlarged  lymph nodes identified. No abdominal aortic aneurysm demonstrated. Other:  None. Musculoskeletal: No suspicious bone lesions identified. IMPRESSION: 1. There is gross intra and extrahepatic biliary ductal dilatation, the common bile duct measuring up to 2.1 cm centrally with numerous gallstones (>10) present in the pancreatic portion of the common bile duct, the largest near the ampulla measuring at least 1.3 cm. Findings are consistent with choledocholithiasis. 2. Relatively contracted gallbladder containing multiple additional gallstones. Electronically Signed   By: Eddie Candle M.D.   On:  02/04/2021 11:35    Impression: 80 year old male with no significant past medical history who presented to the emergency room with reports of 4 to 6 weeks of intermittent postprandial nausea and vomiting without associated abdominal pain, single episode of low-volume hematemesis yesterday.  Associated un-quantified weight loss. He denies any other significant GI symptoms. No BRBPR or melena.  He was previously evaluated by PCP in June and found to have elevated LFTs/bilirubin with AST 82, ALT 117, alk phos 885, total bilirubin 3.7.  Ultrasound in July with dilated CBD, nodular asymmetric wall thickening of gallbladder with scattered echogenic foci throughout the wall that was nonspecific, but could reflect adenomyomatosis or neoplasm.  Also fatty liver.  On evaluation today, AST 273, ALT 192, alk phos 740, T bili 7.4.  Lipase normal.  MRI/MRCP with intra and extrahepatic biliary ductal dilation, CBD up to 2.1 cm with numerous gallstones (>10) in the pancreatic portion of CBD, largest near ampulla measuring 1.3 cm.  Relatively contracted gallbladder containing multiple additional gallstones.  Clinical picture consistent with choledocholithiasis.  No symptoms concerning for cholangitis.  White count is normal.  He is afebrile.  No mental status changes.  Though there was some question of possible neoplasm on ultrasound, MRI did not suggest this.  He will need ERCP tomorrow.  Discussed this with the patient along with risk and benefits and he is agreeable to proceed.  Anemia: Hemoglobin 12.4 on admission which is actually improved from 10.6, 2 months ago.  No prior baseline hemoglobin to compare to.  Ferritin 275, iron 15 (L), saturation 6% (L), folate and B12 within normal limits.  Patient denies BRBPR or melena.  Single episode of low-volume hematemesis yesterday.  Aside from 4 to 6 weeks of intermittent nausea/vomiting discussed above likely secondary to choledocholithiasis, he denies any other significant GI  symptoms.  Denies NSAIDs.  No prior EGD or colonoscopy.  States he is not interested in ever having a colonoscopy due to 2 friends having poor outcomes.  We are planning to proceed with ERCP tomorrow.  This will also help to evaluate for upper GI source contributing to anemia.  We have recommended at least 1 colonoscopy for patient which can be completed outpatient if he becomes agreeable.  Plan: ERCP with Dr. Laural Golden tomorrow. The risks, benefits, and alternatives have been discussed with the patient in detail. The patient states understanding and desires to proceed.  Clear liquid diet today.  N.p.o. at midnight. Monitor for increasing white count, fever, or change in mental status.  If this occurs, would need to start antibiotics. Would recommend patient having at least 1 colonoscopy to be completed outpatient if he becomes agreeable. Continue to follow H&H and for overt GI bleeding. Follow HFP. Continue PPI daily for now.    LOS: 0 days    02/04/2021, 3:19 PM   Aliene Altes, [facility]

## 2021-02-04 NOTE — H&P (Signed)
History and Physical  Dean Randolph  [name] IEP:[id number] DOB: 1940/09/03 DOA: 02/04/2021  PCP: Mechele Claude, MD  Patient coming from: Home  Level of care: Med-Surg  I have personally briefly reviewed patient's old medical records in [facility]  Chief Complaint: abdominal pain   HPI: Dean Randolph is a 80 y.o. male with no significant known past medical history who had not seen a physician in over 20 years up until 2 months ago when he presented to a primary care physician with new complaints of abdominal pain that was noted to be intermittent and associated with eating meals.  He was sent for labs that revealed elevated liver enzymes and mildly elevated bilirubin.  He was subsequently sent for abdominal ultrasound.  He was being referred to GI however the appointment was due for November 2022.  He continued to have intermittent bouts of severe abdominal pain and early satiety associated with nausea and occasional emesis.  He reports that in the last 24 hours she started having severe epigastric abdominal pain and vomiting with tinges of blood seen.  That made him come to the emergency department for further evaluation.  He denied having any fever or chills.  He denies having diarrhea.  He denied chest pain and shortness of breath constipation. ED Course: Labs in the ED revealed markedly elevated LFTs with an AST of 273 and ALT of 192.  His total bilirubin was 7.4.  His glucose was 138.  His WBC was 9.7 and hemoglobin 12.4.  He was sent for an MRCP with findings of choledocholithiasis and CBD dilatation.  GI was consulted and recommended admission for ERCP consideration and supportive measures.  Review of Systems: Review of Systems  Constitutional:  Positive for weight loss. Negative for chills, diaphoresis, fever and malaise/fatigue.  HENT: Negative.    Eyes: Negative.   Respiratory: Negative.    Cardiovascular: Negative.   Gastrointestinal:  Positive for abdominal pain,  nausea and vomiting. Negative for blood in stool, constipation, diarrhea and melena.  Genitourinary: Negative.   Musculoskeletal: Negative.   Skin:  Positive for itching.  Neurological: Negative.   Endo/Heme/Allergies: Negative.   Psychiatric/Behavioral: Negative.    All other systems reviewed and are negative.   History reviewed. No pertinent past medical history.  History reviewed. No pertinent surgical history.   reports that he has never smoked. He has been exposed to tobacco smoke. He has never used smokeless tobacco. He reports that he does not drink alcohol and does not use drugs.  No Known Allergies  History reviewed. No pertinent family history.  Prior to Admission medications   Medication Sig Start Date End Date Taking? Authorizing Provider  acetaminophen (TYLENOL) 325 MG tablet Take 650 mg by mouth every 6 (six) hours as needed for headache or mild pain.   Yes [provider]    Physical Exam: Vitals:   02/04/21 0900 02/04/21 0930 02/04/21 1000 02/04/21 1100  BP: (!) 114/98 120/69 113/63 116/66  Pulse: 80 72 72 76  Resp: 20 16 16 18   Temp:      TempSrc:      SpO2: 96% 98% 99% 98%  Weight:      Height:        Constitutional: elderly male, lying in bed, NAD, calm, comfortable Eyes: PERRL, lids and conjunctivae normal ENMT: Mucous membranes are moist. Posterior pharynx clear of any exudate or lesions.Normal dentition.  Neck: normal, supple, no masses, no thyromegaly Respiratory: clear to auscultation bilaterally, no wheezing,  no crackles. Normal respiratory effort. No accessory muscle use.  Cardiovascular: normal s1, s2 sounds, no murmurs / rubs / gallops. No extremity edema. 2+ pedal pulses. No carotid bruits.  Abdomen: very mild RUQ tenderness, no masses palpated. No hepatosplenomegaly. Bowel sounds positive.  Musculoskeletal: no clubbing / cyanosis. No joint deformity upper and lower extremities. Good ROM, no contractures. Normal muscle tone.  Skin: no  rashes, lesions, ulcers. No induration Neurologic: CN 2-12 grossly intact. Sensation intact, DTR normal. Strength 5/5 in all 4.  Psychiatric: Normal judgment and insight. Alert and oriented x 3. Normal mood.   Labs on Admission: I have personally reviewed following labs and imaging studies  CBC: Recent Labs  Lab 02/04/21 0912  WBC 9.7  NEUTROABS 8.2*  HGB 12.4*  HCT 33.6*  MCV 107.0*  PLT 223   Basic Metabolic Panel: Recent Labs  Lab 02/04/21 0912  NA 135  K 3.6  CL 101  CO2 27  GLUCOSE 138*  BUN 20  CREATININE 0.82  CALCIUM 8.8*   GFR: Estimated Creatinine Clearance: 69.4 mL/min (by C-G formula based on SCr of 0.82 mg/dL). Liver Function Tests: Recent Labs  Lab 02/04/21 0912  AST 273*  ALT 192*  ALKPHOS 740*  BILITOT 7.4*  PROT 7.9  ALBUMIN 2.9*   Recent Labs  Lab 02/04/21 0912  LIPASE 24   No results for input(s): AMMONIA in the last 168 hours. Coagulation Profile: Recent Labs  Lab 02/04/21 0912  INR 1.0   Cardiac Enzymes: No results for input(s): CKTOTAL, CKMB, CKMBINDEX, TROPONINI in the last 168 hours. BNP (last 3 results) No results for input(s): PROBNP in the last 8760 hours. HbA1C: No results for input(s): HGBA1C in the last 72 hours. CBG: No results for input(s): GLUCAP in the last 168 hours. Lipid Profile: No results for input(s): CHOL, HDL, LDLCALC, TRIG, CHOLHDL, LDLDIRECT in the last 72 hours. Thyroid Function Tests: No results for input(s): TSH, T4TOTAL, FREET4, T3FREE, THYROIDAB in the last 72 hours. Anemia Panel: No results for input(s): VITAMINB12, FOLATE, FERRITIN, TIBC, IRON, RETICCTPCT in the last 72 hours. Urine analysis: No results found for: COLORURINE, APPEARANCEUR, LABSPEC, PHURINE, GLUCOSEU, HGBUR, BILIRUBINUR, KETONESUR, PROTEINUR, UROBILINOGEN, NITRITE, LEUKOCYTESUR  Radiological Exams on Admission: MR 3D Recon At Scanner  Result Date: 02/04/2021 CLINICAL DATA:  Jaundice, upper abdominal pain, abnormal ultrasound,  malignancy suspected EXAM: MRI ABDOMEN WITHOUT AND WITH CONTRAST (INCLUDING MRCP) TECHNIQUE: Multiplanar multisequence MR imaging of the abdomen was performed both before and after the administration of intravenous contrast. Heavily T2-weighted images of the biliary and pancreatic ducts were obtained, and three-dimensional MRCP images were rendered by post processing. CONTRAST:  7mL GADAVIST GADOBUTROL 1 MMOL/ML IV SOLN COMPARISON:  Right upper quadrant ultrasound, 12/31/2020 FINDINGS: Examination is generally somewhat limited by breath motion artifact, particularly on contrast enhanced sequences. Lower chest: No acute findings. Hepatobiliary: No mass or other parenchymal abnormality identified. Relatively contracted gallbladder containing multiple gallstones. There is gross intra and extrahepatic biliary ductal dilatation, the common bile duct measuring up to 2.1 cm centrally (series 3, image 16) with numerous gallstones (>10) of varying sizes present in the pancreatic portion of the common bile duct, the largest near the ampulla measuring at least 1.3 cm (series 3, image 16). Pancreas: No mass, inflammatory changes, or other parenchymal abnormality identified. No pancreatic ductal dilatation. Spleen:  Within normal limits in size and appearance. Adrenals/Urinary Tract: No masses identified. Simple, benign bilateral renal cortical and parapelvic cysts. No evidence of hydronephrosis. Stomach/Bowel: Visualized portions within the abdomen are  unremarkable. Vascular/Lymphatic: No pathologically enlarged lymph nodes identified. No abdominal aortic aneurysm demonstrated. Other:  None. Musculoskeletal: No suspicious bone lesions identified. IMPRESSION: 1. There is gross intra and extrahepatic biliary ductal dilatation, the common bile duct measuring up to 2.1 cm centrally with numerous gallstones (>10) present in the pancreatic portion of the common bile duct, the largest near the ampulla measuring at least 1.3 cm.  Findings are consistent with choledocholithiasis. 2. Relatively contracted gallbladder containing multiple additional gallstones. Electronically Signed   By: Lauralyn Primes M.D.   On: 02/04/2021 11:35   MR ABDOMEN MRCP W WO CONTAST  Result Date: 02/04/2021 CLINICAL DATA:  Jaundice, upper abdominal pain, abnormal ultrasound, malignancy suspected EXAM: MRI ABDOMEN WITHOUT AND WITH CONTRAST (INCLUDING MRCP) TECHNIQUE: Multiplanar multisequence MR imaging of the abdomen was performed both before and after the administration of intravenous contrast. Heavily T2-weighted images of the biliary and pancreatic ducts were obtained, and three-dimensional MRCP images were rendered by post processing. CONTRAST:  35mL GADAVIST GADOBUTROL 1 MMOL/ML IV SOLN COMPARISON:  Right upper quadrant ultrasound, 12/31/2020 FINDINGS: Examination is generally somewhat limited by breath motion artifact, particularly on contrast enhanced sequences. Lower chest: No acute findings. Hepatobiliary: No mass or other parenchymal abnormality identified. Relatively contracted gallbladder containing multiple gallstones. There is gross intra and extrahepatic biliary ductal dilatation, the common bile duct measuring up to 2.1 cm centrally (series 3, image 16) with numerous gallstones (>10) of varying sizes present in the pancreatic portion of the common bile duct, the largest near the ampulla measuring at least 1.3 cm (series 3, image 16). Pancreas: No mass, inflammatory changes, or other parenchymal abnormality identified. No pancreatic ductal dilatation. Spleen:  Within normal limits in size and appearance. Adrenals/Urinary Tract: No masses identified. Simple, benign bilateral renal cortical and parapelvic cysts. No evidence of hydronephrosis. Stomach/Bowel: Visualized portions within the abdomen are unremarkable. Vascular/Lymphatic: No pathologically enlarged lymph nodes identified. No abdominal aortic aneurysm demonstrated. Other:  None.  Musculoskeletal: No suspicious bone lesions identified. IMPRESSION: 1. There is gross intra and extrahepatic biliary ductal dilatation, the common bile duct measuring up to 2.1 cm centrally with numerous gallstones (>10) present in the pancreatic portion of the common bile duct, the largest near the ampulla measuring at least 1.3 cm. Findings are consistent with choledocholithiasis. 2. Relatively contracted gallbladder containing multiple additional gallstones. Electronically Signed   By: Lauralyn Primes M.D.   On: 02/04/2021 11:35    EKG: Independently reviewed.   Assessment/Plan Principal Problem:   Choledocholithiasis Active Problems:   Hyperglycemia   Intermittent abdominal pain   Early satiety   Abnormal weight loss   Hyperbilirubinemia   Elevated liver enzymes   Hematemesis   Choledocholithiasis - Pt presented with dilated CBD.  He has signs of cholestasis.  He will need ERCP. GI consultation requested and will follow their recommendations.  Clear liquid diet for now.  IV fluids ordered and IV pain medication ordered. No S/S of infection at this time.   Hyperglycemia - check A1c, likely reactive.    Hyperbilirubinemia - fortunately patient is asymptomatic, management per GI.    Elevated LFTs - secondary to above, follow CMP.    Nausea and vomiting - IV meds as needed.   Hematemesis - mild, IV protonix ordered.      DVT prophylaxis: SCDs  Code Status: Full   Family Communication: daughter at bedside   Disposition Plan: Home   Consults called: GI   Admission status: INP   Level of care: Med-Surg Standley Dakins MD [facility] How to contact the [facility] Attending or Consulting provider 7A - 7P or covering provider during after hours 7P -7A, for this patient?  Check the care team in [facility] and look for a) attending/consulting [facility] provider listed and b) the [facility] team listed Log into [email or web address] and use [facility]'s universal password to access. If you do not have the  password, please contact the [facility] operator. Locate the [facility] provider you are looking for under [facility] and page to a number that you can be directly reached. If you still have difficulty reaching the provider, please page the [facility] (Director on Call) for the Hospitalists listed on amion for assistance.   If 7PM-7AM, please contact night-coverage [email or web address] Password [facility]  02/04/2021, 12:36 PM

## 2021-02-04 NOTE — ED Triage Notes (Signed)
Pt arrived to ED c/o some blood noted with vomiting after  he ate dinner. No clumps of blood noted. No more episodes of blood noted.

## 2021-02-05 DIAGNOSIS — R17 Unspecified jaundice: Secondary | ICD-10-CM

## 2021-02-05 LAB — COMPREHENSIVE METABOLIC PANEL
ALT: 174 U/L — ABNORMAL HIGH (ref 0–44)
AST: 218 U/L — ABNORMAL HIGH (ref 15–41)
Albumin: 2.6 g/dL — ABNORMAL LOW (ref 3.5–5.0)
Alkaline Phosphatase: 633 U/L — ABNORMAL HIGH (ref 38–126)
Anion gap: 6 (ref 5–15)
BUN: 14 mg/dL (ref 8–23)
CO2: 27 mmol/L (ref 22–32)
Calcium: 8.8 mg/dL — ABNORMAL LOW (ref 8.9–10.3)
Chloride: 104 mmol/L (ref 98–111)
Creatinine, Ser: 0.64 mg/dL (ref 0.61–1.24)
GFR, Estimated: >60 mL/min (ref >60)
Glucose, Bld: 87 mg/dL (ref 70–99)
Potassium: 3.6 mmol/L (ref 3.5–5.1)
Sodium: 137 mmol/L (ref 135–145)
Total Bilirubin: 5.8 mg/dL — ABNORMAL HIGH (ref 0.3–1.2)
Total Protein: 7 g/dL (ref 6.5–8.1)

## 2021-02-05 LAB — CBC WITH DIFFERENTIAL/PLATELET
Abs Immature Granulocytes: 0.02 10*3/uL (ref 0.00–0.07)
Basophils Absolute: 0 10*3/uL (ref 0.0–0.1)
Basophils Relative: 1 %
Eosinophils Absolute: 0.1 10*3/uL (ref 0.0–0.5)
Eosinophils Relative: 2 %
HCT: 37 % — ABNORMAL LOW (ref 39.0–52.0)
Hemoglobin: 11.8 g/dL — ABNORMAL LOW (ref 13.0–17.0)
Immature Granulocytes: 0 %
Lymphocytes Relative: 13 %
Lymphs Abs: 1.1 10*3/uL (ref 0.7–4.0)
MCH: 32.8 pg (ref 26.0–34.0)
MCHC: 31.9 g/dL (ref 30.0–36.0)
MCV: 102.8 fL — ABNORMAL HIGH (ref 80.0–100.0)
Monocytes Absolute: 0.8 10*3/uL (ref 0.1–1.0)
Monocytes Relative: 10 %
Neutro Abs: 6 10*3/uL (ref 1.7–7.7)
Neutrophils Relative %: 74 %
Platelets: 192 10*3/uL (ref 150–400)
RBC: 3.6 MIL/uL — ABNORMAL LOW (ref 4.22–5.81)
RDW: 15.1 % (ref 11.5–15.5)
WBC: 8 10*3/uL (ref 4.0–10.5)
nRBC: 0 % (ref 0.0–0.2)

## 2021-02-05 LAB — TYPE AND SCREEN
ABO/RH(D): A POS
Antibody Screen: POSITIVE
DAT, IgG: NEGATIVE
DAT, complement: NEGATIVE

## 2021-02-05 LAB — MAGNESIUM: Magnesium: 1.9 mg/dL (ref 1.7–2.4)

## 2021-02-05 MED ORDER — SODIUM CHLORIDE 0.9 % IV SOLN: INTRAVENOUS | Status: DC

## 2021-02-05 MED ORDER — SODIUM CHLORIDE 0.9 % IV SOLN
2.0000 g | INTRAVENOUS | Status: DC
  Administered 2021-02-05 – 2021-02-06 (×2): 2 g via INTRAVENOUS
  Filled 2021-02-05 (×2): qty 20

## 2021-02-05 NOTE — Plan of Care (Signed)
  Problem: Education: Goal: Knowledge of General Education information will improve Description Including pain rating scale, medication(s)/side effects and non-pharmacologic comfort measures Outcome: Progressing   Problem: Health Behavior/Discharge Planning: Goal: Ability to manage health-related needs will improve Outcome: Progressing   

## 2021-02-05 NOTE — Consult Note (Signed)
Aware of consult.  Awaiting results of ERCP.

## 2021-02-05 NOTE — Progress Notes (Signed)
Initial Nutrition Assessment  DOCUMENTATION CODES:      INTERVENTION:  Boost Breeze po TID, each supplement provides 250 kcal and 9 grams of protein    NUTRITION DIAGNOSIS:     related to   as evidenced by  .    GOAL:        MONITOR:      REASON FOR ASSESSMENT:   Malnutrition Screening Tool    ASSESSMENT: Patient is a 80 yo male who presents with severe epigastric abdominal pain and intermittent nausea and vomiting the past 4-6 weeks per GI. ERCP scheduled today.   MRI/MRCP- choledocholithiasis and common bile duct dilation.   Patient NPO for procedure but that has been delayed until tomorrow. Patient expressing frustration about having to wait. His diet advanced to full liquids and he is hungry. RD will follow for tolerance of diet advancement and adequacy.   Patient active when feeling well and usually has a good appetite and eats 3 meals daily.   Patient reports weight loss of around 15 lb the past month due to poor oral intake. Based on hospital records pt weight is down 5% x 2 months. Not significant for time.  Medications reviewed and include: Boost and Protonix   Labs: BMP Latest Ref Rng & Units 02/05/2021 02/04/2021 12/02/2020  Glucose 70 - 99 mg/dL 87 144(Q) 584(K)  BUN 8 - 23 mg/dL 14 20 20   Creatinine 0.61 - 1.24 mg/dL 3.50 7.57  BUN/Creat Ratio 10 - 24 - - 19  Sodium 135 - 145 mmol/L 137 135 135  Potassium 3.5 - 5.1 mmol/L 3.6 3.6 3.9  Chloride 98 - 111 mmol/L 104 101 99  CO2 22 - 32 mmol/L 27 27 20   Calcium 8.9 - 10.3 mg/dL 3.22) ) 8.8     NUTRITION - FOCUSED PHYSICAL EXAM: Nutrition-Focused physical exam completed. Findings are moderate buccal, mild orbital fat depletion, mild clavicle, acromion/deltoid muscle depletion, and no edema.     Diet Order:   Diet Order             Diet NPO time specified  Diet effective midnight           Diet full liquid Room service appropriate? Yes; Fluid consistency: Thin  Diet effective now                    EDUCATION NEEDS:     Skin:  Skin Assessment: Reviewed RN Assessment (jaundice)  Last BM:  8/9  Height:   Ht Readings from Last 1 Encounters:  02/04/21 5\' 6"  (1.676 m)    Weight:   Wt Readings from Last 1 Encounters:  02/04/21 67.4 kg    Ideal Body Weight:   65 kg  BMI:  Body mass index is 23.98 kg/m.  Estimated Nutritional Needs:   Kcal:  2022-2144  Protein:  87-93 gr  Fluid:  2 liters daily   MS,RD,CSG,LDN Contact: 04/06/21

## 2021-02-05 NOTE — Progress Notes (Signed)
Subjective:  Patient states he has had a good day today.  He does not have abdominal pain nausea or vomiting.  He is hungry.  He says he has been having intermittent symptoms for about 3 months.  His daughter Malachy Mood who is at bedside tells me that his liver tests were abnormal 2 months ago but there was no follow-up.  Patient denies melena or rectal bleeding or hematuria.  He denies pruritus.  Current Medications:  Current Facility-Administered Medications:    0.9 %  sodium chloride infusion, , Intravenous, Continuous, Johnson, Clanford L, MD, Last Rate: 65 mL/hr at 02/05/21 1103, New Bag at 02/05/21 1103   0.9 %  sodium chloride infusion, , Intravenous, Continuous, Harper, Kristen S, PA-C   acetaminophen (TYLENOL) tablet 650 mg, 650 mg, Oral, Q6H PRN **OR** acetaminophen (TYLENOL) suppository 650 mg, 650 mg, Rectal, Q6H PRN, Johnson, Clanford L, MD   feeding supplement (BOOST / RESOURCE BREEZE) liquid 1 Container, 1 Container, Oral, TID BM, Johnson, Clanford L, MD, 1 Container at 02/04/21 2238   fentaNYL (SUBLIMAZE) injection 12.5 mcg, 12.5 mcg, Intravenous, Q2H PRN, Johnson, Clanford L, MD   ondansetron (ZOFRAN) tablet 4 mg, 4 mg, Oral, Q6H PRN **OR** ondansetron (ZOFRAN) injection 4 mg, 4 mg, Intravenous, Q6H PRN, Johnson, Clanford L, MD   pantoprazole (PROTONIX) injection 40 mg, 40 mg, Intravenous, Q24H, Johnson, Clanford L, MD, 40 mg at 02/04/21 1741   Objective: Blood pressure (!) 144/56, pulse 64, temperature 98.2 F (36.8 C), temperature source Oral, resp. rate 16, height 5' 6"  (1.676 m), weight 67.4 kg, SpO2 100 %. Patient is alert and in no acute distress. Conjunctiva is pink. Sclera is icteric Oropharyngeal mucosa is normal. No neck masses or thyromegaly noted. Cardiac exam with regular rhythm normal S1 and S2. No murmur or gallop noted. Lungs are clear to auscultation. Abdomen is soft and nontender with organomegaly or masses. No LE edema or clubbing noted.  Labs/studies  Results:   CBC Latest Ref Rng & Units 02/05/2021 02/04/2021 12/11/2020  WBC 4.0 - 10.5 K/uL 8.0 9.7 7.0  Hemoglobin 13.0 - 17.0 g/dL 11.8(L) 12.4(L) 11.9(L)  Hematocrit 39.0 - 52.0 % 37.0(L) 33.6(L) 36.7(L)  Platelets 150 - 400 K/uL 192 223 335    CMP Latest Ref Rng & Units 02/05/2021 02/04/2021 12/11/2020  Glucose 70 - 99 mg/dL 87 138(H) -  BUN 8 - 23 mg/dL 14 20 -  Creatinine 0.61 - 1.24 mg/dL 0.64 0.82 -  Sodium 135 - 145 mmol/L 137 135 -  Potassium 3.5 - 5.1 mmol/L 3.6 3.6 -  Chloride 98 - 111 mmol/L 104 101 -  CO2 22 - 32 mmol/L 27 27 -  Calcium 8.9 - 10.3 mg/dL 8.8(L) 8.8(L) -  Total Protein 6.5 - 8.1 g/dL 7.0 7.9 7.7  Total Bilirubin 0.3 - 1.2 mg/dL 5.8(H) 7.4(H) 1.6(H)  Alkaline Phos 38 - 126 U/L 633(H) 740(H) 674(H)  AST 15 - 41 U/L 218(H) 273(H) 107(H)  ALT 0 - 44 U/L 174(H) 192(H) 101(H)    Hepatic Function Latest Ref Rng & Units 02/05/2021 02/04/2021 12/11/2020  Total Protein 6.5 - 8.1 g/dL 7.0 7.9 7.7  Albumin 3.5 - 5.0 g/dL 2.6(L) 2.9(L) 3.7  AST 15 - 41 U/L 218(H) 273(H) 107(H)  ALT 0 - 44 U/L 174(H) 192(H) 101(H)  Alk Phosphatase 38 - 126 U/L 633(H) 740(H) 674(H)  Total Bilirubin 0.3 - 1.2 mg/dL 5.8(H) 7.4(H) 1.6(H)  Bilirubin, Direct 0.00 - 0.40 mg/dL - - 1.10(H)    MRCP images reviewed with patient's  daughter.  Serum iron 15, TIBC 264 and saturation 4%. Ferritin 275. B12 356 Serum folate 17.4.  Assessment:  #1.  Choledocholithiasis.  Patient does not appear to be acutely ill but he could have cholangitis and therefore needs to be on an antibiotic.  Bilirubin and transaminases are trending down.  It remains to be seen if he has distal bile duct stricture in addition to stones.  #2.  Iron deficiency anemia.  Serum ferritin is normal at and it may be function of acute phase reaction and therefore not accurate.  He should consider colonoscopy at a later date.  Plan:  Patient begun on full liquids. N.p.o. after midnight. ERCP with sphincterotomy and stone extraction  and possible biliary stenting. Ceftriaxone 2 g IV every 24 hours.

## 2021-02-05 NOTE — Progress Notes (Signed)
PROGRESS NOTE  Dean Randolph ZOX:[id number] DOB: 27-Jun-1941 DOA: 02/04/2021 PCP: Mechele ClaudeStacks, [place], MD  Brief History:  80 y.o. male with no significant known past medical history who had not seen a physician in over 20 years up until 2 months ago when he presented to a primary care physician with new complaints of abdominal pain that was noted to be intermittent and associated with eating meals.  He was sent for labs that revealed elevated liver enzymes and mildly elevated bilirubin.  He was subsequently sent for abdominal ultrasound.  He was being referred to GI however the appointment was due for November 2022.  He continued to have intermittent bouts of severe abdominal pain and early satiety associated with nausea and occasional emesis.  He reports that in the last 24 hours she started having severe epigastric abdominal pain and vomiting with tinges of blood seen.  That made him come to the emergency department for further evaluation.  He denied having any fever or chills.  He denies having diarrhea.  He denied chest pain and shortness of breath constipation. ED Course: Labs in the ED revealed markedly elevated LFTs with an AST of 273 and ALT of 192.  His total bilirubin was 7.4.  His glucose was 138.  His WBC was 9.7 and hemoglobin 12.4.  He was sent for an MRCP with findings of choledocholithiasis and CBD dilatation.  GI was consulted and recommended admission for ERCP consideration and supportive measures  Assessment/Plan: Choledocholithiasis -  -Pt presented with dilated CBD.   -GI consultation requested and will follow their recommendations.  Clear liquid diet for now.  IV fluids ordered and IV pain medication ordered. No S/S of infection at this time. -8/9 MRCP--gross intra and extrahepatic biliary ductal dilatation with CBD up to 2.1cm with numerous gallstones; contracted GB with numerous gall stones -general surgery consult -ERCP planned 02/06/21   Hyperglycemia -  -12/11/20  A1C--5.3 -likely reactive   Hyperbilirubinemia/Transaminasemia -fortunately patient is asymptomatic, management per GI.     Hematemesis  -mild, IV protonix ordered.       Status is: Inpatient  Remains inpatient appropriate because:Inpatient level of care appropriate due to severity of illness  Dispo: The patient is from: Home              Anticipated d/c is to: Home              Patient currently is not medically stable to d/c.   Difficult to place patient No        Family Communication:   Daughter at bedside 8/10  Consultants:  GI, general surgery  Code Status:  FULL  DVT Prophylaxis:  SCDs   Procedures: As Listed in Progress Note Above  Antibiotics: None       Subjective: Patient denies fevers, chills, headache, chest pain, dyspnea, nausea, vomiting, diarrhea, abdominal pain, dysuria, hematuria, hematochezia, and melena.   Objective: Vitals:   02/04/21 1834 02/04/21 1959 02/04/21 2356 02/05/21 0401  BP: (!) 176/89 139/63 (!) 123/97 (!) 144/56  Pulse: 69 67 70 64  Resp: 18 16 16 16   Temp: 98 F (36.7 C) 98.3 F (36.8 C) 98.3 F (36.8 C) 98.2 F (36.8 C)  TempSrc: Oral Oral Oral Oral  SpO2: 100% 100% 100% 100%  Weight: 67.4 kg     Height: 5\' 6"  (1.676 m)       Intake/Output Summary (Last 24 hours) at 02/05/2021 1816 Last data filed at 02/05/2021  0800 Gross per 24 hour  Intake --  Output 1350 ml  Net -1350 ml   Weight change:  Exam:  General:  Pt is alert, follows commands appropriately, not in acute distress HEENT: No icterus, No thrush, No neck mass, [place]/AT Cardiovascular: RRR, S1/S2, no rubs, no gallops Respiratory: CTA bilaterally, no wheezing, no crackles, no rhonchi Abdomen: Soft/+BS, non tender, non distended, no guarding Extremities: No edema, No lymphangitis, No petechiae, No rashes, no synovitis   Data Reviewed: I have personally reviewed following labs and imaging studies Basic Metabolic Panel: Recent Labs  Lab  02/04/21 0912 02/05/21 0340  NA 135 137  K 3.6 3.6  CL 101 104  CO2 27 27  GLUCOSE 138* 87  BUN 20 14  CREATININE 0.82 0.64  CALCIUM 8.8* 8.8*  MG  --  1.9   Liver Function Tests: Recent Labs  Lab 02/04/21 0912 02/05/21 0340  AST 273* 218*  ALT 192* 174*  ALKPHOS 740* 633*  BILITOT 7.4* 5.8*  PROT 7.9 7.0  ALBUMIN 2.9* 2.6*   Recent Labs  Lab 02/04/21 0912  LIPASE 24   No results for input(s): AMMONIA in the last 168 hours. Coagulation Profile: Recent Labs  Lab 02/04/21 0912  INR 1.0   CBC: Recent Labs  Lab 02/04/21 0912 02/05/21 0340  WBC 9.7 8.0  NEUTROABS 8.2* 6.0  HGB 12.4* 11.8*  HCT 33.6* 37.0*  MCV 107.0* 102.8*  PLT 223 192   Cardiac Enzymes: No results for input(s): CKTOTAL, CKMB, CKMBINDEX, TROPONINI in the last 168 hours. BNP: Invalid input(s): POCBNP CBG: No results for input(s): GLUCAP in the last 168 hours. HbA1C: No results for input(s): HGBA1C in the last 72 hours. Urine analysis: No results found for: COLORURINE, APPEARANCEUR, LABSPEC, PHURINE, GLUCOSEU, HGBUR, BILIRUBINUR, KETONESUR, PROTEINUR, UROBILINOGEN, NITRITE, LEUKOCYTESUR Sepsis Labs: @LABRCNTIP (procalcitonin:4,lacticidven:4) ) Recent Results (from the past 240 hour(s))  Resp Panel by RT-PCR (Flu A&B, Covid) Nasopharyngeal Swab     Status: None   Collection Time: 02/04/21  1:10 PM   Specimen: Nasopharyngeal Swab; Nasopharyngeal(NP) swabs in vial transport medium  Result Value Ref Range Status   SARS Coronavirus 2 by RT PCR NEGATIVE NEGATIVE Final    Comment: (NOTE) SARS-CoV-2 target nucleic acids are NOT DETECTED.  The SARS-CoV-2 RNA is generally detectable in upper respiratory specimens during the acute phase of infection. The lowest concentration of SARS-CoV-2 viral copies this assay can detect is 138 copies/mL. A negative result does not preclude SARS-Cov-2 infection and should not be used as the sole basis for treatment or other patient management decisions. A  negative result may occur with  improper specimen collection/handling, submission of specimen other than nasopharyngeal swab, presence of viral mutation(s) within the areas targeted by this assay, and inadequate number of viral copies(<138 copies/mL). A negative result must be combined with clinical observations, patient history, and epidemiological information. The expected result is Negative.  Fact Sheet for Patients:  04/06/21  Fact Sheet for Healthcare Providers:  BloggerCourse.com  This test is no t yet approved or cleared by the [place] FDA and  has been authorized for detection and/or diagnosis of SARS-CoV-2 by FDA under an Emergency Use Authorization (EUA). This EUA will remain  in effect (meaning this test can be used) for the duration of the COVID-19 declaration under Section 564(b)(1) of the Act, 21 U.S.C.section 360bbb-3(b)(1), unless the authorization is terminated  or revoked sooner.       Influenza A by PCR NEGATIVE NEGATIVE Final   Influenza B by PCR  NEGATIVE NEGATIVE Final    Comment: (NOTE) The Xpert Xpress SARS-CoV-2/FLU/RSV plus assay is intended as an aid in the diagnosis of influenza from Nasopharyngeal swab specimens and should not be used as a sole basis for treatment. Nasal washings and aspirates are unacceptable for Xpert Xpress SARS-CoV-2/FLU/RSV testing.  Fact Sheet for Patients: BloggerCourse.com  Fact Sheet for Healthcare Providers: [employer]ious[employer]ker[employer]it  This test is not yet approved or cleared by the [place] FDA and has been authorized for detection and/or diagnosis of SARS-CoV-2 by FDA under an Emergency Use Authorization (EUA). This EUA will remain in effect (meaning this test can be used) for the duration of the COVID-19 declaration under Section 564(b)(1) of the Act, 21 U.S.C. section 360bbb-3(b)(1), unless the authorization  is terminated or revoked.  Performed at [facility], [address], [place], [place] [address]      Scheduled Meds:  feeding supplement  1 Container Oral TID BM   pantoprazole (PROTONIX) IV  40 mg Intravenous Q24H   Continuous Infusions:  sodium chloride 65 mL/hr at 02/05/21 1103   sodium chloride      Procedures/Studies: MR 3D Recon At Scanner  Result Date: 02/04/2021 CLINICAL DATA:  Jaundice, upper abdominal pain, abnormal ultrasound, malignancy suspected EXAM: MRI ABDOMEN WITHOUT AND WITH CONTRAST (INCLUDING MRCP) TECHNIQUE: Multiplanar multisequence MR imaging of the abdomen was performed both before and after the administration of intravenous contrast. Heavily T2-weighted images of the biliary and pancreatic ducts were obtained, and three-dimensional MRCP images were rendered by post processing. CONTRAST:  48mL GADAVIST GADOBUTROL 1 MMOL/ML IV SOLN COMPARISON:  Right upper quadrant ultrasound, 12/31/2020 FINDINGS: Examination is generally somewhat limited by breath motion artifact, particularly on contrast enhanced sequences. Lower chest: No acute findings. Hepatobiliary: No mass or other parenchymal abnormality identified. Relatively contracted gallbladder containing multiple gallstones. There is gross intra and extrahepatic biliary ductal dilatation, the common bile duct measuring up to 2.1 cm centrally (series 3, image 16) with numerous gallstones (>10) of varying sizes present in the pancreatic portion of the common bile duct, the largest near the ampulla measuring at least 1.3 cm (series 3, image 16). Pancreas: No mass, inflammatory changes, or other parenchymal abnormality identified. No pancreatic ductal dilatation. Spleen:  Within normal limits in size and appearance. Adrenals/Urinary Tract: No masses identified. Simple, benign bilateral renal cortical and parapelvic cysts. No evidence of hydronephrosis. Stomach/Bowel: Visualized portions within the abdomen are unremarkable.  Vascular/Lymphatic: No pathologically enlarged lymph nodes identified. No abdominal aortic aneurysm demonstrated. Other:  None. Musculoskeletal: No suspicious bone lesions identified. IMPRESSION: 1. There is gross intra and extrahepatic biliary ductal dilatation, the common bile duct measuring up to 2.1 cm centrally with numerous gallstones (>10) present in the pancreatic portion of the common bile duct, the largest near the ampulla measuring at least 1.3 cm. Findings are consistent with choledocholithiasis. 2. Relatively contracted gallbladder containing multiple additional gallstones. Electronically Signed   By: Lauralyn Primes M.D.   On: 02/04/2021 11:35   MR ABDOMEN MRCP W WO CONTAST  Result Date: 02/04/2021 CLINICAL DATA:  Jaundice, upper abdominal pain, abnormal ultrasound, malignancy suspected EXAM: MRI ABDOMEN WITHOUT AND WITH CONTRAST (INCLUDING MRCP) TECHNIQUE: Multiplanar multisequence MR imaging of the abdomen was performed both before and after the administration of intravenous contrast. Heavily T2-weighted images of the biliary and pancreatic ducts were obtained, and three-dimensional MRCP images were rendered by post processing. CONTRAST:  58mL GADAVIST GADOBUTROL 1 MMOL/ML IV SOLN COMPARISON:  Right upper quadrant ultrasound, 12/31/2020 FINDINGS: Examination is generally somewhat limited by  breath motion artifact, particularly on contrast enhanced sequences. Lower chest: No acute findings. Hepatobiliary: No mass or other parenchymal abnormality identified. Relatively contracted gallbladder containing multiple gallstones. There is gross intra and extrahepatic biliary ductal dilatation, the common bile duct measuring up to 2.1 cm centrally (series 3, image 16) with numerous gallstones (>10) of varying sizes present in the pancreatic portion of the common bile duct, the largest near the ampulla measuring at least 1.3 cm (series 3, image 16). Pancreas: No mass, inflammatory changes, or other parenchymal  abnormality identified. No pancreatic ductal dilatation. Spleen:  Within normal limits in size and appearance. Adrenals/Urinary Tract: No masses identified. Simple, benign bilateral renal cortical and parapelvic cysts. No evidence of hydronephrosis. Stomach/Bowel: Visualized portions within the abdomen are unremarkable. Vascular/Lymphatic: No pathologically enlarged lymph nodes identified. No abdominal aortic aneurysm demonstrated. Other:  None. Musculoskeletal: No suspicious bone lesions identified. IMPRESSION: 1. There is gross intra and extrahepatic biliary ductal dilatation, the common bile duct measuring up to 2.1 cm centrally with numerous gallstones (>10) present in the pancreatic portion of the common bile duct, the largest near the ampulla measuring at least 1.3 cm. Findings are consistent with choledocholithiasis. 2. Relatively contracted gallbladder containing multiple additional gallstones. Electronically Signed   By: Lauralyn Primes M.D.   On: 02/04/2021 11:35    Catarina Hartshorn, DO  [facility]  If 7PM-7AM, please contact night-coverage [email or web address] Password [email or web address] 02/05/2021, 6:16 PM   LOS: 1 day

## 2021-02-06 ENCOUNTER — Inpatient Hospital Stay (HOSPITAL_COMMUNITY): Payer: Medicare Other | Admitting: Certified Registered"

## 2021-02-06 ENCOUNTER — Encounter (HOSPITAL_COMMUNITY): Payer: Self-pay | Admitting: Family Medicine

## 2021-02-06 ENCOUNTER — Encounter (HOSPITAL_COMMUNITY)
Admission: EM | Disposition: A | Discharge status: Home / Self Care | Payer: Self-pay | Source: Home / Self Care | Attending: Internal Medicine

## 2021-02-06 ENCOUNTER — Inpatient Hospital Stay (HOSPITAL_COMMUNITY): Payer: Medicare Other

## 2021-02-06 ENCOUNTER — Other Ambulatory Visit: Payer: Self-pay

## 2021-02-06 DIAGNOSIS — K8050 Calculus of bile duct without cholangitis or cholecystitis without obstruction: Secondary | ICD-10-CM

## 2021-02-06 DIAGNOSIS — K8309 Other cholangitis: Secondary | ICD-10-CM

## 2021-02-06 DIAGNOSIS — K8031 Calculus of bile duct with cholangitis, unspecified, with obstruction: Secondary | ICD-10-CM

## 2021-02-06 DIAGNOSIS — K8051 Calculus of bile duct without cholangitis or cholecystitis with obstruction: Secondary | ICD-10-CM

## 2021-02-06 HISTORY — PX: SPHINCTEROTOMY: SHX5279

## 2021-02-06 HISTORY — PX: BILIARY STENT PLACEMENT: SHX5538

## 2021-02-06 HISTORY — PX: ERCP: SHX5425

## 2021-02-06 LAB — COMPREHENSIVE METABOLIC PANEL
ALT: 165 U/L — ABNORMAL HIGH (ref 0–44)
AST: 188 U/L — ABNORMAL HIGH (ref 15–41)
Albumin: 2.6 g/dL — ABNORMAL LOW (ref 3.5–5.0)
Alkaline Phosphatase: 644 U/L — ABNORMAL HIGH (ref 38–126)
Anion gap: 7 (ref 5–15)
BUN: 13 mg/dL (ref 8–23)
CO2: 25 mmol/L (ref 22–32)
Calcium: 8.9 mg/dL (ref 8.9–10.3)
Chloride: 104 mmol/L (ref 98–111)
Creatinine, Ser: 0.72 mg/dL (ref 0.61–1.24)
GFR, Estimated: >60 mL/min (ref >60)
Glucose, Bld: 90 mg/dL (ref 70–99)
Potassium: 3.4 mmol/L — ABNORMAL LOW (ref 3.5–5.1)
Sodium: 136 mmol/L (ref 135–145)
Total Bilirubin: 4.6 mg/dL — ABNORMAL HIGH (ref 0.3–1.2)
Total Protein: 7.1 g/dL (ref 6.5–8.1)

## 2021-02-06 LAB — CBC WITH DIFFERENTIAL/PLATELET
Abs Immature Granulocytes: 0.02 10*3/uL (ref 0.00–0.07)
Basophils Absolute: 0 10*3/uL (ref 0.0–0.1)
Basophils Relative: 0 %
Eosinophils Absolute: 0.1 10*3/uL (ref 0.0–0.5)
Eosinophils Relative: 2 %
HCT: 37.8 % — ABNORMAL LOW (ref 39.0–52.0)
Hemoglobin: 12 g/dL — ABNORMAL LOW (ref 13.0–17.0)
Immature Granulocytes: 0 %
Lymphocytes Relative: 18 %
Lymphs Abs: 1.4 10*3/uL (ref 0.7–4.0)
MCH: 33 pg (ref 26.0–34.0)
MCHC: 31.7 g/dL (ref 30.0–36.0)
MCV: 103.8 fL — ABNORMAL HIGH (ref 80.0–100.0)
Monocytes Absolute: 0.6 10*3/uL (ref 0.1–1.0)
Monocytes Relative: 8 %
Neutro Abs: 5.4 10*3/uL (ref 1.7–7.7)
Neutrophils Relative %: 72 %
Platelets: 214 10*3/uL (ref 150–400)
RBC: 3.64 MIL/uL — ABNORMAL LOW (ref 4.22–5.81)
RDW: 15.4 % (ref 11.5–15.5)
WBC: 7.6 10*3/uL (ref 4.0–10.5)
nRBC: 0 % (ref 0.0–0.2)

## 2021-02-06 LAB — MAGNESIUM: Magnesium: 1.9 mg/dL (ref 1.7–2.4)

## 2021-02-06 SURGERY — ERCP, WITH INTERVENTION IF INDICATED
Anesthesia: General

## 2021-02-06 MED ORDER — MEPERIDINE HCL 50 MG/ML IJ SOLN: 6.2500 mg | INTRAMUSCULAR | Status: DC | PRN

## 2021-02-06 MED ORDER — STERILE WATER FOR IRRIGATION IR SOLN
Status: DC | PRN
  Administered 2021-02-06: 1000 mL

## 2021-02-06 MED ORDER — ONDANSETRON HCL 4 MG/2ML IJ SOLN
INTRAMUSCULAR | Status: AC
  Filled 2021-02-06: qty 2

## 2021-02-06 MED ORDER — SUCCINYLCHOLINE CHLORIDE 200 MG/10ML IV SOSY
PREFILLED_SYRINGE | INTRAVENOUS | Status: AC
  Filled 2021-02-06: qty 10

## 2021-02-06 MED ORDER — HYDROMORPHONE HCL 1 MG/ML IJ SOLN: 0.2500 mg | INTRAMUSCULAR | Status: DC | PRN

## 2021-02-06 MED ORDER — LACTATED RINGERS IV SOLN: INTRAVENOUS | Status: DC

## 2021-02-06 MED ORDER — ONDANSETRON HCL 4 MG/2ML IJ SOLN
INTRAMUSCULAR | Status: DC | PRN
  Administered 2021-02-06: 4 mg via INTRAVENOUS

## 2021-02-06 MED ORDER — PHENYLEPHRINE 40 MCG/ML (10ML) SYRINGE FOR IV PUSH (FOR BLOOD PRESSURE SUPPORT)
PREFILLED_SYRINGE | INTRAVENOUS | Status: AC
  Filled 2021-02-06: qty 10

## 2021-02-06 MED ORDER — ONDANSETRON HCL 4 MG/2ML IJ SOLN: 4.0000 mg | Freq: Once | INTRAMUSCULAR | Status: DC | PRN

## 2021-02-06 MED ORDER — GLUCAGON HCL RDNA (DIAGNOSTIC) 1 MG IJ SOLR
INTRAMUSCULAR | Status: DC | PRN
  Administered 2021-02-06 (×3): .25 mg via INTRAVENOUS

## 2021-02-06 MED ORDER — LIDOCAINE HCL (PF) 2 % IJ SOLN
INTRAMUSCULAR | Status: AC
  Filled 2021-02-06: qty 5

## 2021-02-06 MED ORDER — PHENYLEPHRINE HCL (PRESSORS) 10 MG/ML IV SOLN
INTRAVENOUS | Status: DC | PRN
  Administered 2021-02-06 (×3): 80 ug via INTRAVENOUS
  Administered 2021-02-06: 40 ug via INTRAVENOUS

## 2021-02-06 MED ORDER — PROPOFOL 10 MG/ML IV BOLUS
INTRAVENOUS | Status: DC | PRN
  Administered 2021-02-06: 150 mg via INTRAVENOUS
  Administered 2021-02-06: 30 mg via INTRAVENOUS

## 2021-02-06 MED ORDER — SEVOFLURANE IN SOLN
RESPIRATORY_TRACT | Status: AC
  Filled 2021-02-06: qty 250

## 2021-02-06 MED ORDER — DEXAMETHASONE SODIUM PHOSPHATE 4 MG/ML IJ SOLN
INTRAMUSCULAR | Status: DC | PRN
  Administered 2021-02-06: 4 mg via INTRAVENOUS

## 2021-02-06 MED ORDER — SUCCINYLCHOLINE CHLORIDE 200 MG/10ML IV SOSY
PREFILLED_SYRINGE | INTRAVENOUS | Status: DC | PRN
  Administered 2021-02-06: 120 mg via INTRAVENOUS

## 2021-02-06 MED ORDER — SODIUM CHLORIDE 0.9 % IV SOLN
INTRAVENOUS | Status: DC | PRN
  Administered 2021-02-06: 20 mL

## 2021-02-06 MED ORDER — HYDROCODONE-ACETAMINOPHEN 7.5-325 MG PO TABS: 1.0000 | ORAL_TABLET | Freq: Once | ORAL | Status: DC | PRN

## 2021-02-06 MED ORDER — FENTANYL CITRATE (PF) 100 MCG/2ML IJ SOLN
INTRAMUSCULAR | Status: AC
  Filled 2021-02-06: qty 2

## 2021-02-06 MED ORDER — ROCURONIUM BROMIDE 10 MG/ML (PF) SYRINGE
PREFILLED_SYRINGE | INTRAVENOUS | Status: AC
  Filled 2021-02-06: qty 10

## 2021-02-06 MED ORDER — FENTANYL CITRATE (PF) 100 MCG/2ML IJ SOLN
INTRAMUSCULAR | Status: DC | PRN
  Administered 2021-02-06 (×2): 50 ug via INTRAVENOUS

## 2021-02-06 MED ORDER — PROPOFOL 10 MG/ML IV BOLUS
INTRAVENOUS | Status: AC
  Filled 2021-02-06: qty 20

## 2021-02-06 MED ORDER — KETOROLAC TROMETHAMINE 30 MG/ML IJ SOLN: 30.0000 mg | Freq: Once | INTRAMUSCULAR | Status: DC | PRN

## 2021-02-06 MED ORDER — EPHEDRINE SULFATE 50 MG/ML IJ SOLN
INTRAMUSCULAR | Status: DC | PRN
  Administered 2021-02-06: 5 mg via INTRAVENOUS

## 2021-02-06 MED ORDER — EPHEDRINE 5 MG/ML INJ
INTRAVENOUS | Status: AC
  Filled 2021-02-06: qty 5

## 2021-02-06 MED ORDER — ROCURONIUM BROMIDE 10 MG/ML (PF) SYRINGE
PREFILLED_SYRINGE | INTRAVENOUS | Status: DC | PRN
  Administered 2021-02-06: 40 mg via INTRAVENOUS

## 2021-02-06 MED ORDER — SODIUM CHLORIDE 0.9 % IV SOLN
INTRAVENOUS | Status: AC
  Filled 2021-02-06: qty 100

## 2021-02-06 SURGICAL SUPPLY — 25 items
BALLN RETRIEVAL 12X15 (BALLOONS) IMPLANT
BALN RTRVL 200 6-7FR 12-15 (BALLOONS)
BASKET TRAPEZOID 3X6 (MISCELLANEOUS) IMPLANT
BASKET TRAPEZOID LITHO 2.0X5 (MISCELLANEOUS) ×1 IMPLANT
BSKT STON RTRVL TRAPEZOID 2X5 (MISCELLANEOUS)
BSKT STON RTRVL TRAPEZOID 3X6 (MISCELLANEOUS)
DEVICE INFLATION ENCORE 26 (MISCELLANEOUS) IMPLANT
DEVICE LOCKING W-BIOPSY CAP (MISCELLANEOUS) ×2 IMPLANT
GUIDEWIRE HYDRA JAGWIRE .35 (WIRE) IMPLANT
GUIDEWIRE JAG HINI 025X260CM (WIRE) IMPLANT
KIT ENDO PROCEDURE PEN (KITS) ×2 IMPLANT
KIT TURNOVER KIT A (KITS) ×2 IMPLANT
LUBRICANT JELLY 4.5OZ STERILE (MISCELLANEOUS) IMPLANT
PAD ARMBOARD 7.5X6 YLW CONV (MISCELLANEOUS) ×2 IMPLANT
POSITIONER HEAD 8X9X4 ADT (SOFTGOODS) IMPLANT
SCOPE SPY DS DISPOSABLE (MISCELLANEOUS) ×1 IMPLANT
SNARE ROTATE MED OVAL 20MM (MISCELLANEOUS) IMPLANT
SNARE SHORT THROW 13M SML OVAL (MISCELLANEOUS) IMPLANT
SPHINCTEROTOME AUTOTOME .25 (MISCELLANEOUS) IMPLANT
SPHINCTEROTOME HYDRATOME 44 (MISCELLANEOUS) ×1 IMPLANT
SYSTEM CONTINUOUS INJECTION (MISCELLANEOUS) ×1 IMPLANT
TUBING INSUFFLATOR CO2MPACT (TUBING) ×2 IMPLANT
WALLSTENT METAL COVERED 10X60 (STENTS) IMPLANT
WALLSTENT METAL COVERED 10X80 (STENTS) IMPLANT
WATER STERILE IRR 1000ML POUR (IV SOLUTION) ×2 IMPLANT

## 2021-02-06 NOTE — Plan of Care (Signed)
  Problem: Education: Goal: Knowledge of General Education information will improve Description Including pain rating scale, medication(s)/side effects and non-pharmacologic comfort measures Outcome: Progressing   Problem: Health Behavior/Discharge Planning: Goal: Ability to manage health-related needs will improve Outcome: Progressing   

## 2021-02-06 NOTE — Consult Note (Signed)
Reason for Consult: Cholelithiasis, choledocholithiasis Referring Physician: Dr. Harrell Randolph is an 80 y.o. male.  HPI: Patient is a 80 year old white male who presented to the emergency room with worsening abdominal pain, nausea, and generalized malaise.  He was found to have significant transaminitis with jaundice.  He does have choledocholithiasis as well as cholelithiasis.  He otherwise feels fine.  He is undergoing an ERCP today by Dr. Karilyn Randolph.  History reviewed. No pertinent past medical history.  History reviewed. No pertinent surgical history.  History reviewed. No pertinent family history.  Social History:  reports that he has never smoked. He has been exposed to tobacco smoke. He has never used smokeless tobacco. He reports that he does not drink alcohol and does not use drugs.  Allergies: No Known Allergies  Medications: I have reviewed the patient's current medications. Prior to Admission:  Medications Prior to Admission  Medication Sig Dispense Refill Last Dose   acetaminophen (TYLENOL) 325 MG tablet Take 650 mg by mouth every 6 (six) hours as needed for headache or mild pain.   Past Month    Results for orders placed or performed during the hospital encounter of 02/04/21 (from the past 48 hour(s))  Type and screen Dean Randolph     Status: None   Collection Time: 02/04/21  9:09 AM  Result Value Ref Range   ABO/RH(D) A POS    Antibody Screen POS    Sample Expiration 02/07/2021,2359    Antibody Identification NO CLINICALLY SIGNIFICANT ANTIBODY IDENTIFIED    DAT, IgG NEG    DAT, complement      NEG Performed at [facility], [address], [place], [place] [address]   Comprehensive metabolic panel     Status: Abnormal   Collection Time: 02/04/21  9:12 AM  Result Value Ref Range   Sodium 135 135 - 145 mmol/L   Potassium 3.6 3.5 - 5.1 mmol/L   Chloride 101 98 - 111 mmol/L   CO2 27 22 - 32 mmol/L   Glucose, Bld 138 (H) 70 - 99 mg/dL    Comment: Glucose  reference range applies only to samples taken after fasting for at least 8 hours.   BUN 20 8 - 23 mg/dL   Creatinine, Ser 0.96 0.61 - 1.24 mg/dL   Calcium 8.8 (L) 8.9 - 10.3 mg/dL   Total Protein 7.9 6.5 - 8.1 g/dL   Albumin 2.9 (L) 3.5 - 5.0 g/dL   AST 283 (H) 15 - 41 U/L   ALT 192 (H) 0 - 44 U/L   Alkaline Phosphatase 740 (H) 38 - 126 U/L   Total Bilirubin 7.4 (H) 0.3 - 1.2 mg/dL   GFR, Estimated >66 >29 mL/min    Comment: (NOTE) Calculated using the CKD-EPI Creatinine Equation (2021)    Anion gap 7 5 - 15    Comment: Performed at [facility], [address] [place], [place] [address]  Lipase, blood     Status: None   Collection Time: 02/04/21  9:12 AM  Result Value Ref Range   Lipase 24 11 - 51 U/L    Comment: Performed at [facility], [address] [place], [place] [address]  CBC with Differential     Status: Abnormal   Collection Time: 02/04/21  9:12 AM  Result Value Ref Range   WBC 9.7 4.0 - 10.5 K/uL   RBC 3.14 (L) 4.22 - 5.81 MIL/uL   Hemoglobin 12.4 (L) 13.0 - 17.0 g/dL   HCT 46.5 (L) 68.1 -  52.0 %   MCV 107.0 (H) 80.0 - 100.0 fL   MCH 39.5 (H) 26.0 - 34.0 pg   MCHC 36.9 (H) 30.0 - 36.0 g/dL   RDW 40.918.6 (H) 81.111.5 - 91.415.5 %   Platelets 223 150 - 400 K/uL   nRBC 0.0 0.0 - 0.2 %   Neutrophils Relative % 85 %   Neutro Abs 8.2 (H) 1.7 - 7.7 K/uL   Lymphocytes Relative 8 %   Lymphs Abs 0.8 0.7 - 4.0 K/uL   Monocytes Relative 7 %   Monocytes Absolute 0.7 0.1 - 1.0 K/uL   Eosinophils Relative 0 %   Eosinophils Absolute 0.0 0.0 - 0.5 K/uL   Basophils Relative 0 %   Basophils Absolute 0.0 0.0 - 0.1 K/uL   Immature Granulocytes 0 %   Abs Immature Granulocytes 0.03 0.00 - 0.07 K/uL    Comment: Performed at [facility], [address] [place], [place] [address]  Protime-INR     Status: None   Collection Time: 02/04/21  9:12 AM  Result Value Ref Range   Prothrombin Time 13.5 11.4 - 15.2 seconds   INR 1.0 0.8 - 1.2    Comment: (NOTE) INR goal varies based on device and  disease states. Performed at [facility], [address] [place], [place] [address]   Reticulocytes     Status: Abnormal   Collection Time: 02/04/21  9:12 AM  Result Value Ref Range   Retic Ct Pct 1.9 0.4 - 3.1 %   RBC. 3.73 (L) 4.22 - 5.81 MIL/uL   Retic Count, Absolute 71.6 19.0 - 186.0 K/uL   Immature Retic Fract 10.6 2.3 - 15.9 %    Comment: Performed at [facility], [address] [place], [place] [address]  Vitamin B12     Status: None   Collection Time: 02/04/21 11:45 AM  Result Value Ref Range   Vitamin B-12 356 180 - 914 pg/mL    Comment: (NOTE) This assay is not validated for testing neonatal or myeloproliferative syndrome specimens for Vitamin B12 levels. Performed at [facility], [address] [place], [place] [address]   Folate     Status: None   Collection Time: 02/04/21 11:45 AM  Result Value Ref Range   Folate 17.4 >5.9 ng/mL    Comment: Performed at [facility], [address] [place], [place] [address]  Iron and TIBC     Status: Abnormal   Collection Time: 02/04/21 11:45 AM  Result Value Ref Range   Iron 15 (L) 45 - 182 ug/dL   TIBC 132264 440250 - 102450 ug/dL   Saturation Ratios 6 (L) 17.9 - 39.5 %   UIBC 249 ug/dL    Comment: Performed at [facility], [address] [place], [place] [address]  Ferritin     Status: None   Collection Time: 02/04/21 11:45 AM  Result Value Ref Range   Ferritin 275 24 - 336 ng/mL    Comment: Performed at [facility], [address] [place], [place] [address]  Resp Panel by RT-PCR (Flu A&B, Covid) Nasopharyngeal Swab     Status: None   Collection Time: 02/04/21  1:10 PM   Specimen: Nasopharyngeal Swab; Nasopharyngeal(NP) swabs in vial transport medium  Result Value Ref Range   SARS Coronavirus 2 by RT PCR NEGATIVE NEGATIVE    Comment: (NOTE) SARS-CoV-2 target nucleic acids are NOT DETECTED.  The SARS-CoV-2 RNA is generally detectable in upper respiratory specimens during the acute phase of infection. The  lowest concentration  of SARS-CoV-2 viral copies this assay can detect is 138 copies/mL. A negative result does not preclude SARS-Cov-2 infection and should not be used as the sole basis for treatment or other patient management decisions. A negative result may occur with  improper specimen collection/handling, submission of specimen other than nasopharyngeal swab, presence of viral mutation(s) within the areas targeted by this assay, and inadequate number of viral copies(<138 copies/mL). A negative result must be combined with clinical observations, patient history, and epidemiological information. The expected result is Negative.  Fact Sheet for Patients:  BloggerCourse.com  Fact Sheet for Healthcare Providers:  SeriousBroker.it  This test is no t yet approved or cleared by the [place] FDA and  has been authorized for detection and/or diagnosis of SARS-CoV-2 by FDA under an Emergency Use Authorization (EUA). This EUA will remain  in effect (meaning this test can be used) for the duration of the COVID-19 declaration under Section 564(b)(1) of the Act, 21 U.S.C.section 360bbb-3(b)(1), unless the authorization is terminated  or revoked sooner.       Influenza A by PCR NEGATIVE NEGATIVE   Influenza B by PCR NEGATIVE NEGATIVE    Comment: (NOTE) The Xpert Xpress SARS-CoV-2/FLU/RSV plus assay is intended as an aid in the diagnosis of influenza from Nasopharyngeal swab specimens and should not be used as a sole basis for treatment. Nasal washings and aspirates are unacceptable for Xpert Xpress SARS-CoV-2/FLU/RSV testing.  Fact Sheet for Patients: BloggerCourse.com  Fact Sheet for Healthcare Providers: SeriousBroker.it  This test is not yet approved or cleared by the [place] FDA and has been authorized for detection and/or diagnosis of SARS-CoV-2 by FDA under an Emergency  Use Authorization (EUA). This EUA will remain in effect (meaning this test can be used) for the duration of the COVID-19 declaration under Section 564(b)(1) of the Act, 21 U.S.C. section 360bbb-3(b)(1), unless the authorization is terminated or revoked.  Performed at [facility], [address] [place], [place] [address]   Comprehensive metabolic panel     Status: Abnormal   Collection Time: 02/05/21  3:40 AM  Result Value Ref Range   Sodium 137 135 - 145 mmol/L   Potassium 3.6 3.5 - 5.1 mmol/L   Chloride 104 98 - 111 mmol/L   CO2 27 22 - 32 mmol/L   Glucose, Bld 87 70 - 99 mg/dL    Comment: Glucose reference range applies only to samples taken after fasting for at least 8 hours.   BUN 14 8 - 23 mg/dL   Creatinine, Ser 6.04 0.61 - 1.24 mg/dL   Calcium 8.8 (L) 8.9 - 10.3 mg/dL   Total Protein 7.0 6.5 - 8.1 g/dL   Albumin 2.6 (L) 3.5 - 5.0 g/dL   AST 540 (H) 15 - 41 U/L   ALT 174 (H) 0 - 44 U/L   Alkaline Phosphatase 633 (H) 38 - 126 U/L   Total Bilirubin 5.8 (H) 0.3 - 1.2 mg/dL   GFR, Estimated >98 >11 mL/min    Comment: (NOTE) Calculated using the CKD-EPI Creatinine Equation (2021)    Anion gap 6 5 - 15    Comment: Performed at [facility], [address] [place], [place] [address]  Magnesium     Status: None   Collection Time: 02/05/21  3:40 AM  Result Value Ref Range   Magnesium 1.9 1.7 - 2.4 mg/dL    Comment: Performed at [facility], [address], [place], [place] [address]  CBC WITH DIFFERENTIAL     Status: Abnormal  Collection Time: 02/05/21  3:40 AM  Result Value Ref Range   WBC 8.0 4.0 - 10.5 K/uL   RBC 3.60 (L) 4.22 - 5.81 MIL/uL   Hemoglobin 11.8 (L) 13.0 - 17.0 g/dL   HCT 96.2 (L) 95.2 - 84.1 %   MCV 102.8 (H) 80.0 - 100.0 fL   MCH 32.8 26.0 - 34.0 pg   MCHC 31.9 30.0 - 36.0 g/dL   RDW 32.4 40.1 - 02.7 %   Platelets 192 150 - 400 K/uL   nRBC 0.0 0.0 - 0.2 %   Neutrophils Relative % 74 %   Neutro Abs 6.0 1.7 - 7.7 K/uL   Lymphocytes Relative 13 %    Lymphs Abs 1.1 0.7 - 4.0 K/uL   Monocytes Relative 10 %   Monocytes Absolute 0.8 0.1 - 1.0 K/uL   Eosinophils Relative 2 %   Eosinophils Absolute 0.1 0.0 - 0.5 K/uL   Basophils Relative 1 %   Basophils Absolute 0.0 0.0 - 0.1 K/uL   Immature Granulocytes 0 %   Abs Immature Granulocytes 0.02 0.00 - 0.07 K/uL    Comment: Performed at [facility], [address] [place], [place] [address]  Comprehensive metabolic panel     Status: Abnormal   Collection Time: 02/06/21  3:12 AM  Result Value Ref Range   Sodium 136 135 - 145 mmol/L   Potassium 3.4 (L) 3.5 - 5.1 mmol/L   Chloride 104 98 - 111 mmol/L   CO2 25 22 - 32 mmol/L   Glucose, Bld 90 70 - 99 mg/dL    Comment: Glucose reference range applies only to samples taken after fasting for at least 8 hours.   BUN 13 8 - 23 mg/dL   Creatinine, Ser 4.40 0.61 - 1.24 mg/dL   Calcium 8.9 8.9 - 34.7 mg/dL   Total Protein 7.1 6.5 - 8.1 g/dL   Albumin 2.6 (L) 3.5 - 5.0 g/dL   AST 425 (H) 15 - 41 U/L   ALT 165 (H) 0 - 44 U/L   Alkaline Phosphatase 644 (H) 38 - 126 U/L   Total Bilirubin 4.6 (H) 0.3 - 1.2 mg/dL   GFR, Estimated >95 >63 mL/min    Comment: (NOTE) Calculated using the CKD-EPI Creatinine Equation (2021)    Anion gap 7 5 - 15    Comment: Performed at [facility], [address], [place], [place] [address]  Magnesium     Status: None   Collection Time: 02/06/21  3:12 AM  Result Value Ref Range   Magnesium 1.9 1.7 - 2.4 mg/dL    Comment: Performed at [facility], [address], [place], [place] [address]  CBC WITH DIFFERENTIAL     Status: Abnormal   Collection Time: 02/06/21  3:12 AM  Result Value Ref Range   WBC 7.6 4.0 - 10.5 K/uL   RBC 3.64 (L) 4.22 - 5.81 MIL/uL   Hemoglobin 12.0 (L) 13.0 - 17.0 g/dL   HCT 18.8 (L) 41.6 - 60.6 %   MCV 103.8 (H) 80.0 - 100.0 fL   MCH 33.0 26.0 - 34.0 pg   MCHC 31.7 30.0 - 36.0 g/dL   RDW 30.1 60.1 - 09.3 %   Platelets 214 150 - 400 K/uL   nRBC 0.0 0.0 - 0.2 %   Neutrophils Relative % 72 %    Neutro Abs 5.4 1.7 - 7.7 K/uL   Lymphocytes Relative 18 %   Lymphs Abs 1.4 0.7 - 4.0 K/uL   Monocytes Relative 8 %  Monocytes Absolute 0.6 0.1 - 1.0 K/uL   Eosinophils Relative 2 %   Eosinophils Absolute 0.1 0.0 - 0.5 K/uL   Basophils Relative 0 %   Basophils Absolute 0.0 0.0 - 0.1 K/uL   Immature Granulocytes 0 %   Abs Immature Granulocytes 0.02 0.00 - 0.07 K/uL    Comment: Performed at [facility], [address] [place], [place] [address]    MR 3D Recon At Scanner  Result Date: 02/04/2021 CLINICAL DATA:  Jaundice, upper abdominal pain, abnormal ultrasound, malignancy suspected EXAM: MRI ABDOMEN WITHOUT AND WITH CONTRAST (INCLUDING MRCP) TECHNIQUE: Multiplanar multisequence MR imaging of the abdomen was performed both before and after the administration of intravenous contrast. Heavily T2-weighted images of the biliary and pancreatic ducts were obtained, and three-dimensional MRCP images were rendered by post processing. CONTRAST:  7mL GADAVIST GADOBUTROL 1 MMOL/ML IV SOLN COMPARISON:  Right upper quadrant ultrasound, 12/31/2020 FINDINGS: Examination is generally somewhat limited by breath motion artifact, particularly on contrast enhanced sequences. Lower chest: No acute findings. Hepatobiliary: No mass or other parenchymal abnormality identified. Relatively contracted gallbladder containing multiple gallstones. There is gross intra and extrahepatic biliary ductal dilatation, the common bile duct measuring up to 2.1 cm centrally (series 3, image 16) with numerous gallstones (>10) of varying sizes present in the pancreatic portion of the common bile duct, the largest near the ampulla measuring at least 1.3 cm (series 3, image 16). Pancreas: No mass, inflammatory changes, or other parenchymal abnormality identified. No pancreatic ductal dilatation. Spleen:  Within normal limits in size and appearance. Adrenals/Urinary Tract: No masses identified. Simple, benign bilateral renal cortical and  parapelvic cysts. No evidence of hydronephrosis. Stomach/Bowel: Visualized portions within the abdomen are unremarkable. Vascular/Lymphatic: No pathologically enlarged lymph nodes identified. No abdominal aortic aneurysm demonstrated. Other:  None. Musculoskeletal: No suspicious bone lesions identified. IMPRESSION: 1. There is gross intra and extrahepatic biliary ductal dilatation, the common bile duct measuring up to 2.1 cm centrally with numerous gallstones (>10) present in the pancreatic portion of the common bile duct, the largest near the ampulla measuring at least 1.3 cm. Findings are consistent with choledocholithiasis. 2. Relatively contracted gallbladder containing multiple additional gallstones. Electronically Signed   By: Lauralyn Primes M.D.   On: 02/04/2021 11:35   MR ABDOMEN MRCP W WO CONTAST  Result Date: 02/04/2021 CLINICAL DATA:  Jaundice, upper abdominal pain, abnormal ultrasound, malignancy suspected EXAM: MRI ABDOMEN WITHOUT AND WITH CONTRAST (INCLUDING MRCP) TECHNIQUE: Multiplanar multisequence MR imaging of the abdomen was performed both before and after the administration of intravenous contrast. Heavily T2-weighted images of the biliary and pancreatic ducts were obtained, and three-dimensional MRCP images were rendered by post processing. CONTRAST:  7mL GADAVIST GADOBUTROL 1 MMOL/ML IV SOLN COMPARISON:  Right upper quadrant ultrasound, 12/31/2020 FINDINGS: Examination is generally somewhat limited by breath motion artifact, particularly on contrast enhanced sequences. Lower chest: No acute findings. Hepatobiliary: No mass or other parenchymal abnormality identified. Relatively contracted gallbladder containing multiple gallstones. There is gross intra and extrahepatic biliary ductal dilatation, the common bile duct measuring up to 2.1 cm centrally (series 3, image 16) with numerous gallstones (>10) of varying sizes present in the pancreatic portion of the common bile duct, the largest near  the ampulla measuring at least 1.3 cm (series 3, image 16). Pancreas: No mass, inflammatory changes, or other parenchymal abnormality identified. No pancreatic ductal dilatation. Spleen:  Within normal limits in size and appearance. Adrenals/Urinary Tract: No masses identified. Simple, benign bilateral renal cortical and parapelvic cysts. No evidence of  hydronephrosis. Stomach/Bowel: Visualized portions within the abdomen are unremarkable. Vascular/Lymphatic: No pathologically enlarged lymph nodes identified. No abdominal aortic aneurysm demonstrated. Other:  None. Musculoskeletal: No suspicious bone lesions identified. IMPRESSION: 1. There is gross intra and extrahepatic biliary ductal dilatation, the common bile duct measuring up to 2.1 cm centrally with numerous gallstones (>10) present in the pancreatic portion of the common bile duct, the largest near the ampulla measuring at least 1.3 cm. Findings are consistent with choledocholithiasis. 2. Relatively contracted gallbladder containing multiple additional gallstones. Electronically Signed   By: Lauralyn Primes M.D.   On: 02/04/2021 11:35    ROS:  Pertinent items are noted in HPI.  Blood pressure 137/63, pulse 65, temperature 98.1 F (36.7 C), temperature source Oral, resp. rate 18, height 5\' 6"  (1.676 m), weight 67.4 kg, SpO2 100 %. Physical Exam: Pleasant white male no acute distress Eyes are with mild scleral icterus Head is normocephalic, atraumatic Lungs clear to auscultation with your breath sounds bilaterally Heart examination reveals a regular rate and rhythm without S3, S4, murmurs Abdomen is soft and flat.  Nontender, nondistended.  Labs and ultrasound reports reviewed  Assessment/Plan: Impression: Cholelithiasis, choledocholithiasis. Plan: Timing of laparoscopic cholecystectomy does depend on what is found at the time of ERCP.  Given his significant transaminitis, it may be better to do a cholecystectomy as an outpatient.  This was  discussed with the patient and family.  Will discuss with Dr. .  Dean Randolph 02/06/2021, 8:30 AM

## 2021-02-06 NOTE — Anesthesia Postprocedure Evaluation (Signed)
Anesthesia Post Note  Patient: Dean Randolph  Procedure(s) Performed: ENDOSCOPIC RETROGRADE CHOLANGIOPANCREATOGRAPHY (ERCP) BILIARY STENT PLACEMENT SPHINCTEROTOMY  Patient location during evaluation: PACU Anesthesia Type: General Level of consciousness: awake and alert Pain management: pain level controlled Vital Signs Assessment: post-procedure vital signs reviewed and stable Respiratory status: spontaneous breathing, nonlabored ventilation, respiratory function stable and patient connected to nasal cannula oxygen Cardiovascular status: blood pressure returned to baseline and stable Postop Assessment: no apparent nausea or vomiting Anesthetic complications: no   No notable events documented.   Last Vitals:  Vitals:   02/06/21 1115 02/06/21 1130  BP: (!) 145/77 (!) 156/74  Pulse: 62 63  Resp: 18 18  Temp:  (!) 36.4 C  SpO2: 98% 100%    Last Pain:  Vitals:   02/06/21 1115  TempSrc:   PainSc: 0-No pain                 Shona Needles

## 2021-02-06 NOTE — Progress Notes (Signed)
Brief ERCP note.  Normal limited view of esophagus and stomach. Normal ampulla of Vater; periampullary diverticulum. Cholangiogram revealed multiple filling defects in CBD and distal half of CHD. Prominent intrahepatic radicles. Biliary sphincterotomy performed. Unable to advance balloon dilator or Dormate basket deep into the bile duct. This difficulty most likely due to impacted stones in CBD. Able to place 10 French 9 cm long plastic stent for decompression. Patient tolerated the procedure well.

## 2021-02-06 NOTE — Progress Notes (Signed)
Subjective:  Patient has no complaints.  He denies nausea vomiting or abdominal pain.  He also denies chest pain or shortness of breath.  He states only medication that he has taken once in a while is Tylenol but he is not been any prescription medications.    Current Medications:  Current Facility-Administered Medications:    0.9 %  sodium chloride infusion, , Intravenous, Continuous, Johnson, Clanford L, MD, Last Rate: 65 mL/hr at 02/06/21 0456, Infusion Verify at 02/06/21 0456   0.9 %  sodium chloride infusion, , Intravenous, Continuous, Jodi Mourning Kristen S, PA-C   [MAR Hold] acetaminophen (TYLENOL) tablet 650 mg, 650 mg, Oral, Q6H PRN **OR** [MAR Hold] acetaminophen (TYLENOL) suppository 650 mg, 650 mg, Rectal, Q6H PRN, Johnson, Clanford L, MD   [MAR Hold] cefTRIAXone (ROCEPHIN) 2 g in sodium chloride 0.9 % 100 mL IVPB, 2 g, Intravenous, Q24H, Luther Newhouse U, MD, Stopped at 02/05/21 2015   [MAR Hold] feeding supplement (BOOST / RESOURCE BREEZE) liquid 1 Container, 1 Container, Oral, TID BM, Johnson, Clanford L, MD, 1 Container at 02/05/21 1945   [MAR Hold] fentaNYL (SUBLIMAZE) injection 12.5 mcg, 12.5 mcg, Intravenous, Q2H PRN, Wynetta Emery, Clanford L, MD   lactated ringers infusion, , Intravenous, Continuous, Nicanor Alcon, MD, Last Rate: 10 mL/hr at 02/06/21 0918, New Bag at 02/06/21 0918   [MAR Hold] ondansetron (ZOFRAN) tablet 4 mg, 4 mg, Oral, Q6H PRN **OR** [MAR Hold] ondansetron (ZOFRAN) injection 4 mg, 4 mg, Intravenous, Q6H PRN, Johnson, Clanford L, MD   [MAR Hold] pantoprazole (PROTONIX) injection 40 mg, 40 mg, Intravenous, Q24H, Johnson, Clanford L, MD, 40 mg at 02/04/21 1741   sodium chloride 0.9 % infusion, , , ,    Objective: Blood pressure (!) 174/76, pulse 68, temperature 99.9 F (37.7 C), temperature source Oral, resp. rate 20, height 5' 6"  (1.676 m), weight 67.4 kg, SpO2 100 %. Patient is alert and in no acute distress. Conjunctiva is pink. Sclera is icteric.    Oropharyngeal mucosa is normal. No neck masses or thyromegaly noted. Cardiac exam with regular rhythm normal S1 and S2. No murmur or gallop noted. Lungs are clear to auscultation. Abdomen soft and nontender with organomegaly or masses. No LE edema or clubbing noted.  Labs/studies Results:   CBC Latest Ref Rng & Units 02/06/2021 02/05/2021 02/04/2021  WBC 4.0 - 10.5 K/uL 7.6 8.0 9.7  Hemoglobin 13.0 - 17.0 g/dL 12.0(L) 11.8(L) 12.4(L)  Hematocrit 39.0 - 52.0 % 37.8(L) 37.0(L) 33.6(L)  Platelets 150 - 400 K/uL 214 192 223    CMP Latest Ref Rng & Units 02/06/2021 02/05/2021 02/04/2021  Glucose 70 - 99 mg/dL 90 87 138(H)  BUN 8 - 23 mg/dL 13 14 20   Creatinine 0.61 - 1.24 mg/dL 0.72 0.64 0.82  Sodium 135 - 145 mmol/L 136 137 135  Potassium 3.5 - 5.1 mmol/L 3.4(L) 3.6 3.6  Chloride 98 - 111 mmol/L 104 104 101  CO2 22 - 32 mmol/L 25 27 27   Calcium 8.9 - 10.3 mg/dL 8.9 8.8(L) 8.8(L)  Total Protein 6.5 - 8.1 g/dL 7.1 7.0 7.9  Total Bilirubin 0.3 - 1.2 mg/dL 4.6(H) 5.8(H) 7.4(H)  Alkaline Phos 38 - 126 U/L 644(H) 633(H) 740(H)  AST 15 - 41 U/L 188(H) 218(H) 273(H)  ALT 0 - 44 U/L 165(H) 174(H) 192(H)    Hepatic Function Latest Ref Rng & Units 02/06/2021 02/05/2021 02/04/2021  Total Protein 6.5 - 8.1 g/dL 7.1 7.0 7.9  Albumin 3.5 - 5.0 g/dL 2.6(L) 2.6(L) 2.9(L)  AST 15 -  41 U/L 188(H) 218(H) 273(H)  ALT 0 - 44 U/L 165(H) 174(H) 192(H)  Alk Phosphatase 38 - 126 U/L 644(H) 633(H) 740(H)  Total Bilirubin 0.3 - 1.2 mg/dL 4.6(H) 5.8(H) 7.4(H)  Bilirubin, Direct 0.00 - 0.40 mg/dL - - -     Assessment:  #1.  Choledocholithiasis.  MRCP revealed multiple stones in CBD.  Hopefully we not dealing with additional process i.e. distal stricture or neoplasm.  Bilirubin alkaline phosphatase and transaminases are trending down.  Clinically patient does not have cholangitis.  He was begun on ceftriaxone yesterday.  #3.  Cholelithiasis.  Dr. Arnoldo Morale note appreciated.  I agree he will need cholecystectomy once  bile duct is cleared of stones.  #2.  Anemia.  Lab studies suggest iron deficiency anemia.  Plan:  Proceed with ERCP with sphincterotomy and stone extraction and/or stenting. Procedure and risks reviewed with patient and his daughter yesterday in detail. He is agreeable.

## 2021-02-06 NOTE — Anesthesia Procedure Notes (Signed)
Procedure Name: Intubation Date/Time: 02/06/2021 9:45 AM Performed by: Brynda Peon, CRNA Pre-anesthesia Checklist: Patient identified, Emergency Drugs available, Suction available, Patient being monitored and Timeout performed Patient Re-evaluated:Patient Re-evaluated prior to induction Oxygen Delivery Method: Circle system utilized Preoxygenation: Pre-oxygenation with 100% oxygen Induction Type: IV induction Laryngoscope Size: Miller and 2 Grade View: Grade I Tube type: Oral Tube size: 7.5 mm Number of attempts: 1 Airway Equipment and Method: Stylet Placement Confirmation: ETT inserted through vocal cords under direct vision, positive ETCO2, CO2 detector and breath sounds checked- equal and bilateral Secured at: 22 cm Tube secured with: Tape Dental Injury: Teeth and Oropharynx as per pre-operative assessment

## 2021-02-06 NOTE — Progress Notes (Signed)
PROGRESS NOTE  Dean Randolph:[id number] DOB: 06-Apr-1941 DOA: 02/04/2021 PCP: Mechele Claude, MD   Brief History:  80 y.o. male with no significant known past medical history who had not seen a physician in over 20 years up until 2 months ago when he presented to a primary care physician with new complaints of abdominal pain that was noted to be intermittent and associated with eating meals.  He was sent for labs that revealed elevated liver enzymes and mildly elevated bilirubin.  He was subsequently sent for abdominal ultrasound.  He was being referred to GI however the appointment was due for November 2022.  He continued to have intermittent bouts of severe abdominal pain and early satiety associated with nausea and occasional emesis.  He reports that in the last 24 hours she started having severe epigastric abdominal pain and vomiting with tinges of blood seen.  That made him come to the emergency department for further evaluation.  He denied having any fever or chills.  He denies having diarrhea.  He denied chest pain and shortness of breath constipation. ED Course: Labs in the ED revealed markedly elevated LFTs with an AST of 273 and ALT of 192.  His total bilirubin was 7.4.  His glucose was 138.  His WBC was 9.7 and hemoglobin 12.4.  He was sent for an MRCP with findings of choledocholithiasis and CBD dilatation.  GI was consulted and recommended admission for ERCP consideration and supportive measures   Assessment/Plan: Choledocholithiasis -  -Pt presented with dilated CBD.   -GI consultation requested  -IV fluids ordered and IV pain medication ordered. No S/S of infection at this time. -8/9 MRCP--gross intra and extrahepatic biliary ductal dilatation with CBD up to 2.1cm with numerous gallstones; contracted GB with numerous gall stones -general surgery consult -02/06/21 ERCP--common bile duct and common hepatic duct were moderately dilated containing multiple stones. Stones  appeared to be all stacked up and immobile;  sphincterotomy performed and plastic stent placed in CBD -plan repeat ERCP in 4-6 weeks to remove stent and remaining stones -discussed with GI, Dr. Karilyn Cota -lap chole after repeat ERCP   Hyperglycemia -  -12/11/20 A1C--5.3 -likely reactive   Hyperbilirubinemia/Transaminasemia -management per GI.   -due to biliary obstruction -improving   Hematemesis  -mild, IV protonix ordered.   -Hgb stable  Elevated BP -may needs meds at time of d/c         Status is: Inpatient   Remains inpatient appropriate because:Inpatient level of care appropriate due to severity of illness   Dispo: The patient is from: Home              Anticipated d/c is to: Home              Patient currently is not medically stable to d/c.              Difficult to place patient No               Family Communication:   Daughter at bedside 8/11   Consultants:  GI, general surgery   Code Status:  FULL   DVT Prophylaxis:  SCDs     Procedures: As Listed in Progress Note Above   Antibiotics: Ceftriaxone 8/10>>     Subjective: Patient denies fevers, chills, headache, chest pain, dyspnea, nausea, vomiting, diarrhea, abdominal pain, dysuria, hematuria, hematochezia, and melena.   Objective: Vitals:   02/06/21 1100 02/06/21 1115 02/06/21 1130 02/06/21  1300  BP: 131/69 (!) 145/77 (!) 156/74 (!) 160/76  Pulse: 72 62 63 65  Resp: 18 18 18 18   Temp:   (!) 97.5 F (36.4 C) 97.6 F (36.4 C)  TempSrc:    Oral  SpO2: 98% 98% 100% 99%  Weight:      Height:        Intake/Output Summary (Last 24 hours) at 02/06/2021 1554 Last data filed at 02/06/2021 1100 Gross per 24 hour  Intake 2300 ml  Output --  Net 2300 ml   Weight change:  Exam:  General:  Pt is alert, follows commands appropriately, not in acute distress HEENT: No icterus, No thrush, No neck mass, [place]/AT Cardiovascular: RRR, S1/S2, no rubs, no gallops Respiratory: CTA bilaterally, no  wheezing, no crackles, no rhonchi Abdomen: Soft/+BS, non tender, non distended, no guarding Extremities: No edema, No lymphangitis, No petechiae, No rashes, no synovitis   Data Reviewed: I have personally reviewed following labs and imaging studies Basic Metabolic Panel: Recent Labs  Lab 02/04/21 0912 02/05/21 0340 02/06/21 0312  NA 135 137 136  K 3.6 3.6 3.4*  CL 101 104 104  CO2 27 27 25   GLUCOSE 138* 87 90  BUN 20 14 13   CREATININE 0.82 0.64 0.72  CALCIUM 8.8* 8.8* 8.9  MG  --  1.9 1.9   Liver Function Tests: Recent Labs  Lab 02/04/21 0912 02/05/21 0340 02/06/21 0312  AST 273* 218* 188*  ALT 192* 174* 165*  ALKPHOS 740* 633* 644*  BILITOT 7.4* 5.8* 4.6*  PROT 7.9 7.0 7.1  ALBUMIN 2.9* 2.6* 2.6*   Recent Labs  Lab 02/04/21 0912  LIPASE 24   No results for input(s): AMMONIA in the last 168 hours. Coagulation Profile: Recent Labs  Lab 02/04/21 0912  INR 1.0   CBC: Recent Labs  Lab 02/04/21 0912 02/05/21 0340 02/06/21 0312  WBC 9.7 8.0 7.6  NEUTROABS 8.2* 6.0 5.4  HGB 12.4* 11.8* 12.0*  HCT 33.6* 37.0* 37.8*  MCV 107.0* 102.8* 103.8*  PLT 223 192 214   Cardiac Enzymes: No results for input(s): CKTOTAL, CKMB, CKMBINDEX, TROPONINI in the last 168 hours. BNP: Invalid input(s): POCBNP CBG: No results for input(s): GLUCAP in the last 168 hours. HbA1C: No results for input(s): HGBA1C in the last 72 hours. Urine analysis: No results found for: COLORURINE, APPEARANCEUR, LABSPEC, PHURINE, GLUCOSEU, HGBUR, BILIRUBINUR, KETONESUR, PROTEINUR, UROBILINOGEN, NITRITE, LEUKOCYTESUR Sepsis Labs: @LABRCNTIP (procalcitonin:4,lacticidven:4) ) Recent Results (from the past 240 hour(s))  Resp Panel by RT-PCR (Flu A&B, Covid) Nasopharyngeal Swab     Status: None   Collection Time: 02/04/21  1:10 PM   Specimen: Nasopharyngeal Swab; Nasopharyngeal(NP) swabs in vial transport medium  Result Value Ref Range Status   SARS Coronavirus 2 by RT PCR NEGATIVE NEGATIVE  Final    Comment: (NOTE) SARS-CoV-2 target nucleic acids are NOT DETECTED.  The SARS-CoV-2 RNA is generally detectable in upper respiratory specimens during the acute phase of infection. The lowest concentration of SARS-CoV-2 viral copies this assay can detect is 138 copies/mL. A negative result does not preclude SARS-Cov-2 infection and should not be used as the sole basis for treatment or other patient management decisions. A negative result may occur with  improper specimen collection/handling, submission of specimen other than nasopharyngeal swab, presence of viral mutation(s) within the areas targeted by this assay, and inadequate number of viral copies(<138 copies/mL). A negative result must be combined with clinical observations, patient history, and epidemiological information. The expected result is Negative.  Fact Sheet for  Patients:  BloggerCourse.com  Fact Sheet for Healthcare Providers:  SeriousBroker.it  This test is no t yet approved or cleared by the [place] FDA and  has been authorized for detection and/or diagnosis of SARS-CoV-2 by FDA under an Emergency Use Authorization (EUA). This EUA will remain  in effect (meaning this test can be used) for the duration of the COVID-19 declaration under Section 564(b)(1) of the Act, 21 U.S.C.section 360bbb-3(b)(1), unless the authorization is terminated  or revoked sooner.       Influenza A by PCR NEGATIVE NEGATIVE Final   Influenza B by PCR NEGATIVE NEGATIVE Final    Comment: (NOTE) The Xpert Xpress SARS-CoV-2/FLU/RSV plus assay is intended as an aid in the diagnosis of influenza from Nasopharyngeal swab specimens and should not be used as a sole basis for treatment. Nasal washings and aspirates are unacceptable for Xpert Xpress SARS-CoV-2/FLU/RSV testing.  Fact Sheet for Patients: BloggerCourse.com  Fact Sheet for Healthcare  Providers: SeriousBroker.it  This test is not yet approved or cleared by the [place] FDA and has been authorized for detection and/or diagnosis of SARS-CoV-2 by FDA under an Emergency Use Authorization (EUA). This EUA will remain in effect (meaning this test can be used) for the duration of the COVID-19 declaration under Section 564(b)(1) of the Act, 21 U.S.C. section 360bbb-3(b)(1), unless the authorization is terminated or revoked.  Performed at [facility], [address] [place], [place] [address]      Scheduled Meds:  feeding supplement  1 Container Oral TID BM   pantoprazole (PROTONIX) IV  40 mg Intravenous Q24H   Continuous Infusions:  sodium chloride 65 mL/hr at 02/06/21 0456   cefTRIAXone (ROCEPHIN)  IV Stopped (02/05/21 2015)   sodium chloride      Procedures/Studies: MR 3D Recon At Scanner  Result Date: 02/04/2021 CLINICAL DATA:  Jaundice, upper abdominal pain, abnormal ultrasound, malignancy suspected EXAM: MRI ABDOMEN WITHOUT AND WITH CONTRAST (INCLUDING MRCP) TECHNIQUE: Multiplanar multisequence MR imaging of the abdomen was performed both before and after the administration of intravenous contrast. Heavily T2-weighted images of the biliary and pancreatic ducts were obtained, and three-dimensional MRCP images were rendered by post processing. CONTRAST:  58mL GADAVIST GADOBUTROL 1 MMOL/ML IV SOLN COMPARISON:  Right upper quadrant ultrasound, 12/31/2020 FINDINGS: Examination is generally somewhat limited by breath motion artifact, particularly on contrast enhanced sequences. Lower chest: No acute findings. Hepatobiliary: No mass or other parenchymal abnormality identified. Relatively contracted gallbladder containing multiple gallstones. There is gross intra and extrahepatic biliary ductal dilatation, the common bile duct measuring up to 2.1 cm centrally (series 3, image 16) with numerous gallstones (>10) of varying sizes present in the pancreatic  portion of the common bile duct, the largest near the ampulla measuring at least 1.3 cm (series 3, image 16). Pancreas: No mass, inflammatory changes, or other parenchymal abnormality identified. No pancreatic ductal dilatation. Spleen:  Within normal limits in size and appearance. Adrenals/Urinary Tract: No masses identified. Simple, benign bilateral renal cortical and parapelvic cysts. No evidence of hydronephrosis. Stomach/Bowel: Visualized portions within the abdomen are unremarkable. Vascular/Lymphatic: No pathologically enlarged lymph nodes identified. No abdominal aortic aneurysm demonstrated. Other:  None. Musculoskeletal: No suspicious bone lesions identified. IMPRESSION: 1. There is gross intra and extrahepatic biliary ductal dilatation, the common bile duct measuring up to 2.1 cm centrally with numerous gallstones (>10) present in the pancreatic portion of the common bile duct, the largest near the ampulla measuring at least 1.3 cm. Findings are consistent with choledocholithiasis. 2. Relatively contracted gallbladder containing  multiple additional gallstones. Electronically Signed   By: Lauralyn PrimesAlex  Bibbey M.D.   On: 02/04/2021 11:35   DG ERCP  Result Date: 02/06/2021 CLINICAL DATA:  Choledocholithiasis. EXAM: ERCP TECHNIQUE: Multiple spot images obtained with the fluoroscopic device and submitted for interpretation post-procedure. COMPARISON:  MRI/MRCP on 02/04/2021 FINDINGS: Imaging with a C-arm during the procedure demonstrates cannulation of the common bile duct with contrast injection demonstrating a diffusely dilated common bile duct packed with a multitude calculi. Unclear by submitted imaging whether stone extraction was performed. A biliary stent was placed extending from the confluence of right and left intrahepatic ducts into the duodenum. IMPRESSION: Diffusely dilated common bile duct packed with multiple calculi. During the procedure a biliary stent was placed. These images were submitted for  radiologic interpretation only. Please see the procedural report for the amount of contrast and the fluoroscopy time utilized. Electronically Signed   By: Irish LackGlenn  Yamagata M.D.   On: 02/06/2021 11:14   MR ABDOMEN MRCP W WO CONTAST  Result Date: 02/04/2021 CLINICAL DATA:  Jaundice, upper abdominal pain, abnormal ultrasound, malignancy suspected EXAM: MRI ABDOMEN WITHOUT AND WITH CONTRAST (INCLUDING MRCP) TECHNIQUE: Multiplanar multisequence MR imaging of the abdomen was performed both before and after the administration of intravenous contrast. Heavily T2-weighted images of the biliary and pancreatic ducts were obtained, and three-dimensional MRCP images were rendered by post processing. CONTRAST:  7mL GADAVIST GADOBUTROL 1 MMOL/ML IV SOLN COMPARISON:  Right upper quadrant ultrasound, 12/31/2020 FINDINGS: Examination is generally somewhat limited by breath motion artifact, particularly on contrast enhanced sequences. Lower chest: No acute findings. Hepatobiliary: No mass or other parenchymal abnormality identified. Relatively contracted gallbladder containing multiple gallstones. There is gross intra and extrahepatic biliary ductal dilatation, the common bile duct measuring up to 2.1 cm centrally (series 3, image 16) with numerous gallstones (>10) of varying sizes present in the pancreatic portion of the common bile duct, the largest near the ampulla measuring at least 1.3 cm (series 3, image 16). Pancreas: No mass, inflammatory changes, or other parenchymal abnormality identified. No pancreatic ductal dilatation. Spleen:  Within normal limits in size and appearance. Adrenals/Urinary Tract: No masses identified. Simple, benign bilateral renal cortical and parapelvic cysts. No evidence of hydronephrosis. Stomach/Bowel: Visualized portions within the abdomen are unremarkable. Vascular/Lymphatic: No pathologically enlarged lymph nodes identified. No abdominal aortic aneurysm demonstrated. Other:  None.  Musculoskeletal: No suspicious bone lesions identified. IMPRESSION: 1. There is gross intra and extrahepatic biliary ductal dilatation, the common bile duct measuring up to 2.1 cm centrally with numerous gallstones (>10) present in the pancreatic portion of the common bile duct, the largest near the ampulla measuring at least 1.3 cm. Findings are consistent with choledocholithiasis. 2. Relatively contracted gallbladder containing multiple additional gallstones. Electronically Signed   By: Lauralyn PrimesAlex  Bibbey M.D.   On: 02/04/2021 11:35    Catarina Hartshornavid Kema Santaella, DO  [facility]  If 7PM-7AM, please contact night-coverage [email or web address] Password [email or web address] 02/06/2021, 3:54 PM   LOS: 2 days

## 2021-02-06 NOTE — Op Note (Signed)
[facility] Patient Name: Dean KnappGeorge Carlino Procedure Date: 02/06/2021 7:48 AM MRN: [id number] Date of Birth: 1941/04/27 Attending MD: Lionel DecemberNajeeb Cornellius Kropp , MD CSN: [id number] Age: 80 Admit Type: Inpatient Procedure:                ERCP Indications:              Evaluation and possible treatment of bile duct                            stone(s), Jaundice Providers:                Lionel DecemberNajeeb Matea Stanard, MD, [place] Scrapehristine C. Strader RN, RN,                            Burke Keelsrisann Tilley, Technician Referring MD:             Catarina Hartshornavid Tat, DO Medicines:                General Anesthesia Complications:            No immediate complications. Estimated Blood Loss:     Estimated blood loss: none. Procedure:                Pre-Anesthesia Assessment:                           - Prior to the procedure, a History and Physical                            was performed, and patient medications and                            allergies were reviewed. The patient's tolerance of                            previous anesthesia was also reviewed. The risks                            and benefits of the procedure and the sedation                            options and risks were discussed with the patient.                            All questions were answered, and informed consent                            was obtained. Prior Anticoagulants: The patient has                            taken no previous anticoagulant or antiplatelet                            agents. ASA Grade Assessment: II - A patient with  mild systemic disease. After reviewing the risks                            and benefits, the patient was deemed in                            satisfactory condition to undergo the procedure.                           After obtaining informed consent, the scope was                            passed under direct vision. Throughout the                            procedure, the patient's blood pressure,  pulse, and                            oxygen saturations were monitored continuously. The                            [employer] D single use                            duodenoscope was introduced through the mouth, and                            used to inject contrast into and used to inject                            contrast into the bile duct. The ERCP was                            accomplished without difficulty. The patient                            tolerated the procedure well. Scope In: 9:57:43 AM Scope Out: 10:40:44 AM Total Procedure Duration: 0 hours 43 minutes 1 second  Findings:      The scout film was normal. The esophagus was successfully intubated       under direct vision. The scope was advanced to a normal major papilla in       the descending duodenum without detailed examination of the pharynx,       larynx and associated structures, and upper GI tract. The upper GI tract       was grossly normal. The bile duct was deeply cannulated without any       difficulty with the Hydratome sphincterotome. Contrast was injected. I       personally interpreted the bile duct images. Ductal flow of contrast was       adequate. Image quality was adequate. Contrast extended to the entire       biliary tree. The common bile duct and common hepatic duct were       moderately dilated containing multiple stones of varying sizes.. An 8 mm  biliary sphincterotomy was made with a braided Hydratome sphincterotome       using ERBE electrocautery. There was no post-sphincterotomy bleeding.       Unable to advance Damia basket or TTS balloon dilator. Therefore one 10       Fr by 9 cm plastic stent with a single external flap and a single       internal flap was placed 8.5 cm into the common bile duct. Bile and       clear fluid flowed through the stent. The stent was in good position. Impression:               - The common bile duct and common hepatic duct were                             moderately dilated containing multiple stones.                            Stones appeared to be all stacked up and immobile.                           - A biliary sphincterotomy was performed.                           - Unable to advance Domini basket to remove the                            stones.                           - One plastic stent was placed into the common bile                            duct. Moderate Sedation:      Per Anesthesia Care Recommendation:           - Avoid aspirin and anticoagulation medicines for 3                            days.                           - Clear liquid diet today and heart healthy diet                            starting tomorrow.                           - Continue present medications.                           - Repeat ERCP in 4 to 6 weeks to remove stent and                            stones. Procedure Code(s):        --- Professional ---  61950, Endoscopic retrograde                            cholangiopancreatography (ERCP); with placement of                            endoscopic stent into biliary or pancreatic duct,                            including pre- and post-dilation and guide wire                            passage, when performed, including sphincterotomy,                            when performed, each stent Diagnosis Code(s):        --- Professional ---                           K80.51, Calculus of bile duct without cholangitis                            or cholecystitis with obstruction                           R17, Unspecified jaundice CPT copyright 2019 American Medical Association. All rights reserved. The codes documented in this report are preliminary and upon coder review may  be revised to meet current compliance requirements. [name], MD [name], MD 02/06/2021 11:25:29 AM This report has been signed electronically. Number of Addenda: 0

## 2021-02-06 NOTE — Anesthesia Preprocedure Evaluation (Addendum)
Anesthesia Evaluation  Patient identified by MRN, date of birth, ID band Patient awake    Reviewed: Allergy & Precautions, NPO status , Patient's Chart, lab work & pertinent test results  Airway Mallampati: II  TM Distance: >3 FB Neck ROM: Full    Dental no notable dental hx.    Pulmonary neg pulmonary ROS,    Pulmonary exam normal breath sounds clear to auscultation       Cardiovascular negative cardio ROS Normal cardiovascular exam Rhythm:Regular Rate:Normal     Neuro/Psych negative neurological ROS  negative psych ROS   GI/Hepatic negative GI ROS, Neg liver ROS,   Endo/Other  negative endocrine ROS  Renal/GU negative Renal ROS  negative genitourinary   Musculoskeletal negative musculoskeletal ROS (+)   Abdominal   Peds negative pediatric ROS (+)  Hematology negative hematology ROS (+)   Anesthesia Other Findings Choledocholithiasis - Pt presented with dilated CBD.  He has signs of cholestasis Elevated liver enzymes  Reproductive/Obstetrics negative OB ROS                            Anesthesia Physical Anesthesia Plan  ASA: 2  Anesthesia Plan: General   Post-op Pain Management:    Induction: Intravenous  PONV Risk Score and Plan:   Airway Management Planned: Oral ETT  Additional Equipment:   Intra-op Plan:   Post-operative Plan: Extubation in OR  Informed Consent: I have reviewed the patients History and Physical, chart, labs and discussed the procedure including the risks, benefits and alternatives for the proposed anesthesia with the patient or authorized representative who has indicated his/her understanding and acceptance.       Plan Discussed with: CRNA  Anesthesia Plan Comments:         Anesthesia Quick Evaluation

## 2021-02-06 NOTE — Transfer of Care (Signed)
Immediate Anesthesia Transfer of Care Note  Patient: Dean Randolph  Procedure(s) Performed: ENDOSCOPIC RETROGRADE CHOLANGIOPANCREATOGRAPHY (ERCP) BILIARY STENT PLACEMENT SPHINCTEROTOMY  Patient Location: PACU  Anesthesia Type:General  Level of Consciousness: awake, alert , oriented and patient cooperative  Airway & Oxygen Therapy: Patient Spontanous Breathing  Post-op Assessment: Report given to RN, Post -op Vital signs reviewed and stable and Patient moving all extremities X 4  Post vital signs: Reviewed and stable  Last Vitals:  Vitals Value Taken Time  BP    Temp    Pulse    Resp    SpO2      Last Pain:  Vitals:   02/06/21 0905  TempSrc: Oral  PainSc: 0-No pain         Complications: No notable events documented.

## 2021-02-07 ENCOUNTER — Telehealth: Payer: Self-pay | Admitting: *Deleted

## 2021-02-07 ENCOUNTER — Encounter (HOSPITAL_COMMUNITY): Payer: Self-pay | Admitting: Internal Medicine

## 2021-02-07 DIAGNOSIS — I10 Essential (primary) hypertension: Secondary | ICD-10-CM

## 2021-02-07 LAB — COMPREHENSIVE METABOLIC PANEL
ALT: 128 U/L — ABNORMAL HIGH (ref 0–44)
AST: 111 U/L — ABNORMAL HIGH (ref 15–41)
Albumin: 2.5 g/dL — ABNORMAL LOW (ref 3.5–5.0)
Alkaline Phosphatase: 596 U/L — ABNORMAL HIGH (ref 38–126)
Anion gap: 6 (ref 5–15)
BUN: 15 mg/dL (ref 8–23)
CO2: 24 mmol/L (ref 22–32)
Calcium: 9.1 mg/dL (ref 8.9–10.3)
Chloride: 105 mmol/L (ref 98–111)
Creatinine, Ser: 0.68 mg/dL (ref 0.61–1.24)
GFR, Estimated: >60 mL/min (ref >60)
Glucose, Bld: 131 mg/dL — ABNORMAL HIGH (ref 70–99)
Potassium: 4 mmol/L (ref 3.5–5.1)
Sodium: 135 mmol/L (ref 135–145)
Total Bilirubin: 2.8 mg/dL — ABNORMAL HIGH (ref 0.3–1.2)
Total Protein: 7 g/dL (ref 6.5–8.1)

## 2021-02-07 LAB — CBC WITH DIFFERENTIAL/PLATELET
Abs Immature Granulocytes: 0.04 10*3/uL (ref 0.00–0.07)
Basophils Absolute: 0 10*3/uL (ref 0.0–0.1)
Basophils Relative: 0 %
Eosinophils Absolute: 0 10*3/uL (ref 0.0–0.5)
Eosinophils Relative: 0 %
HCT: 37.7 % — ABNORMAL LOW (ref 39.0–52.0)
Hemoglobin: 12.1 g/dL — ABNORMAL LOW (ref 13.0–17.0)
Immature Granulocytes: 1 %
Lymphocytes Relative: 10 %
Lymphs Abs: 1 10*3/uL (ref 0.7–4.0)
MCH: 32.4 pg (ref 26.0–34.0)
MCHC: 32.1 g/dL (ref 30.0–36.0)
MCV: 101.1 fL — ABNORMAL HIGH (ref 80.0–100.0)
Monocytes Absolute: 0.4 10*3/uL (ref 0.1–1.0)
Monocytes Relative: 4 %
Neutro Abs: 8 10*3/uL — ABNORMAL HIGH (ref 1.7–7.7)
Neutrophils Relative %: 85 %
Platelets: 243 10*3/uL (ref 150–400)
RBC: 3.73 MIL/uL — ABNORMAL LOW (ref 4.22–5.81)
RDW: 15.2 % (ref 11.5–15.5)
WBC: 9.4 10*3/uL (ref 4.0–10.5)
nRBC: 0 % (ref 0.0–0.2)

## 2021-02-07 LAB — MAGNESIUM: Magnesium: 1.9 mg/dL (ref 1.7–2.4)

## 2021-02-07 MED ORDER — AMLODIPINE BESYLATE 5 MG PO TABS
5.0000 mg | ORAL_TABLET | Freq: Every day | ORAL | 1 refills | Status: DC
Start: 2021-02-07 — End: 2021-04-15

## 2021-02-07 MED ORDER — AMLODIPINE BESYLATE 5 MG PO TABS
5.0000 mg | ORAL_TABLET | Freq: Every day | ORAL | Status: DC
  Administered 2021-02-07: 5 mg via ORAL
  Filled 2021-02-07: qty 1

## 2021-02-07 NOTE — Discharge Summary (Signed)
Physician Discharge Summary  Dean Randolph BZJ:[id number] DOB: June 09, 1941 DOA: 02/04/2021  PCP: Mechele Claude, MD  Admit date: 02/04/2021 Discharge date: 02/07/2021  Admitted From: Home Disposition:  Home  Recommendations for Outpatient Follow-up:  Follow up with PCP in 1-2 weeks Please obtain BMP/CBC in one week     Discharge Condition: Stable CODE STATUS:FULL Diet recommendation: Heart Healthy    Brief/Interim Summary: 80 y.o. male with no significant known past medical history who had not seen a physician in over 20 years up until 2 months ago when he presented to a primary care physician with new complaints of abdominal pain that was noted to be intermittent and associated with eating meals.  He was sent for labs that revealed elevated liver enzymes and mildly elevated bilirubin.  He was subsequently sent for abdominal ultrasound.  He was being referred to GI however the appointment was due for November 2022.  He continued to have intermittent bouts of severe abdominal pain and early satiety associated with nausea and occasional emesis.  He reports that in the last 24 hours she started having severe epigastric abdominal pain and vomiting with tinges of blood seen.  That made him come to the emergency department for further evaluation.  He denied having any fever or chills.  He denies having diarrhea.  He denied chest pain and shortness of breath constipation. ED Course: Labs in the ED revealed markedly elevated LFTs with an AST of 273 and ALT of 192.  His total bilirubin was 7.4.  His glucose was 138.  His WBC was 9.7 and hemoglobin 12.4.  He was sent for an MRCP with findings of choledocholithiasis and CBD dilatation.  GI was consulted and recommended admission for ERCP consideration and supportive measures.  He underwent ERCP with sphincterotomy on 02/06/21 with continued clinical improvement.  His diet was advanced which he tolerated.  Discharge Diagnoses:   Choledocholithiasis -   -Pt presented with dilated CBD.   -GI consultation requested  -IV fluids ordered and IV pain medication ordered. No S/S of infection at this time. -8/9 MRCP--gross intra and extrahepatic biliary ductal dilatation with CBD up to 2.1cm with numerous gallstones; contracted GB with numerous gall stones -general surgery consult -02/06/21 ERCP--common bile duct and common hepatic duct were moderately dilated containing multiple stones. Stones appeared to be all stacked up and immobile;  sphincterotomy performed and plastic stent placed in CBD -plan repeat ERCP in 4-6 weeks to remove stent and remaining stones -discussed with GI, Dr. Karilyn Cota -lap chole after repeat ERCP in 4-6 weeks -diet advanced on 8/12 which he tolerated   Hyperglycemia -  -12/11/20 A1C--5.3 -likely reactive   Hyperbilirubinemia/Transaminasemia -due to biliary obstruction -improving   Hematemesis  -mild, IV protonix ordered.   -Hgb stable   Hypertension -SBP consistent 160-170 range with DBP 90-100 -amlodipine started  Macrocytic Anemia -B12 356 -folate 17.4 -Hgb stable     Discharge Instructions   Allergies as of 02/07/2021   No Known Allergies      Medication List     TAKE these medications    acetaminophen 325 MG tablet Commonly known as: TYLENOL Take 650 mg by mouth every 6 (six) hours as needed for headache or mild pain.   amLODipine 5 MG tablet Commonly known as: NORVASC Take 1 tablet (5 mg total) by mouth daily.        No Known Allergies  Consultations: GI   Procedures/Studies: MR 3D Recon At Scanner  Result Date: 02/04/2021 CLINICAL DATA:  Jaundice, upper  abdominal pain, abnormal ultrasound, malignancy suspected EXAM: MRI ABDOMEN WITHOUT AND WITH CONTRAST (INCLUDING MRCP) TECHNIQUE: Multiplanar multisequence MR imaging of the abdomen was performed both before and after the administration of intravenous contrast. Heavily T2-weighted images of the biliary and pancreatic ducts were  obtained, and three-dimensional MRCP images were rendered by post processing. CONTRAST:  69mL GADAVIST GADOBUTROL 1 MMOL/ML IV SOLN COMPARISON:  Right upper quadrant ultrasound, 12/31/2020 FINDINGS: Examination is generally somewhat limited by breath motion artifact, particularly on contrast enhanced sequences. Lower chest: No acute findings. Hepatobiliary: No mass or other parenchymal abnormality identified. Relatively contracted gallbladder containing multiple gallstones. There is gross intra and extrahepatic biliary ductal dilatation, the common bile duct measuring up to 2.1 cm centrally (series 3, image 16) with numerous gallstones (>10) of varying sizes present in the pancreatic portion of the common bile duct, the largest near the ampulla measuring at least 1.3 cm (series 3, image 16). Pancreas: No mass, inflammatory changes, or other parenchymal abnormality identified. No pancreatic ductal dilatation. Spleen:  Within normal limits in size and appearance. Adrenals/Urinary Tract: No masses identified. Simple, benign bilateral renal cortical and parapelvic cysts. No evidence of hydronephrosis. Stomach/Bowel: Visualized portions within the abdomen are unremarkable. Vascular/Lymphatic: No pathologically enlarged lymph nodes identified. No abdominal aortic aneurysm demonstrated. Other:  None. Musculoskeletal: No suspicious bone lesions identified. IMPRESSION: 1. There is gross intra and extrahepatic biliary ductal dilatation, the common bile duct measuring up to 2.1 cm centrally with numerous gallstones (>10) present in the pancreatic portion of the common bile duct, the largest near the ampulla measuring at least 1.3 cm. Findings are consistent with choledocholithiasis. 2. Relatively contracted gallbladder containing multiple additional gallstones. Electronically Signed   By: Lauralyn Primes M.D.   On: 02/04/2021 11:35   DG ERCP  Result Date: 02/06/2021 CLINICAL DATA:  Choledocholithiasis. EXAM: ERCP TECHNIQUE:  Multiple spot images obtained with the fluoroscopic device and submitted for interpretation post-procedure. COMPARISON:  MRI/MRCP on 02/04/2021 FINDINGS: Imaging with a C-arm during the procedure demonstrates cannulation of the common bile duct with contrast injection demonstrating a diffusely dilated common bile duct packed with a multitude calculi. Unclear by submitted imaging whether stone extraction was performed. A biliary stent was placed extending from the confluence of right and left intrahepatic ducts into the duodenum. IMPRESSION: Diffusely dilated common bile duct packed with multiple calculi. During the procedure a biliary stent was placed. These images were submitted for radiologic interpretation only. Please see the procedural report for the amount of contrast and the fluoroscopy time utilized. Electronically Signed   By: Irish Lack M.D.   On: 02/06/2021 11:14   MR ABDOMEN MRCP W WO CONTAST  Result Date: 02/04/2021 CLINICAL DATA:  Jaundice, upper abdominal pain, abnormal ultrasound, malignancy suspected EXAM: MRI ABDOMEN WITHOUT AND WITH CONTRAST (INCLUDING MRCP) TECHNIQUE: Multiplanar multisequence MR imaging of the abdomen was performed both before and after the administration of intravenous contrast. Heavily T2-weighted images of the biliary and pancreatic ducts were obtained, and three-dimensional MRCP images were rendered by post processing. CONTRAST:  34mL GADAVIST GADOBUTROL 1 MMOL/ML IV SOLN COMPARISON:  Right upper quadrant ultrasound, 12/31/2020 FINDINGS: Examination is generally somewhat limited by breath motion artifact, particularly on contrast enhanced sequences. Lower chest: No acute findings. Hepatobiliary: No mass or other parenchymal abnormality identified. Relatively contracted gallbladder containing multiple gallstones. There is gross intra and extrahepatic biliary ductal dilatation, the common bile duct measuring up to 2.1 cm centrally (series 3, image 16) with numerous  gallstones (>10) of varying sizes present in  the pancreatic portion of the common bile duct, the largest near the ampulla measuring at least 1.3 cm (series 3, image 16). Pancreas: No mass, inflammatory changes, or other parenchymal abnormality identified. No pancreatic ductal dilatation. Spleen:  Within normal limits in size and appearance. Adrenals/Urinary Tract: No masses identified. Simple, benign bilateral renal cortical and parapelvic cysts. No evidence of hydronephrosis. Stomach/Bowel: Visualized portions within the abdomen are unremarkable. Vascular/Lymphatic: No pathologically enlarged lymph nodes identified. No abdominal aortic aneurysm demonstrated. Other:  None. Musculoskeletal: No suspicious bone lesions identified. IMPRESSION: 1. There is gross intra and extrahepatic biliary ductal dilatation, the common bile duct measuring up to 2.1 cm centrally with numerous gallstones (>10) present in the pancreatic portion of the common bile duct, the largest near the ampulla measuring at least 1.3 cm. Findings are consistent with choledocholithiasis. 2. Relatively contracted gallbladder containing multiple additional gallstones. Electronically Signed   By: Lauralyn Primes M.D.   On: 02/04/2021 11:35        Discharge Exam: Vitals:   02/06/21 1942 02/07/21 0508  BP: (!) 159/96 (!) 166/82  Pulse: 76 61  Resp: 18 16  Temp: 98.1 F (36.7 C) 97.7 F (36.5 C)  SpO2: 100% 100%   Vitals:   02/06/21 1130 02/06/21 1300 02/06/21 1942 02/07/21 0508  BP: (!) 156/74 (!) 160/76 (!) 159/96 (!) 166/82  Pulse: 63 65 76 61  Resp: Temp: (!) 97.5 F (36.4 C) 97.6 F (36.4 C) 98.1 F (36.7 C) 97.7 F (36.5 C)  TempSrc:  Oral Oral Oral  SpO2: 100% 99% 100% 100%  Weight:    69.8 kg  Height:        General: Pt is alert, awake, not in acute distress Cardiovascular: RRR, S1/S2 +, no rubs, no gallops Respiratory: CTA bilaterally, no wheezing, no rhonchi Abdominal: Soft, NT, ND, bowel sounds  + Extremities: no edema, no cyanosis   The results of significant diagnostics from this hospitalization (including imaging, microbiology, ancillary and laboratory) are listed below for reference.    Significant Diagnostic Studies: MR 3D Recon At Scanner  Result Date: 02/04/2021 CLINICAL DATA:  Jaundice, upper abdominal pain, abnormal ultrasound, malignancy suspected EXAM: MRI ABDOMEN WITHOUT AND WITH CONTRAST (INCLUDING MRCP) TECHNIQUE: Multiplanar multisequence MR imaging of the abdomen was performed both before and after the administration of intravenous contrast. Heavily T2-weighted images of the biliary and pancreatic ducts were obtained, and three-dimensional MRCP images were rendered by post processing. CONTRAST:  7mL GADAVIST GADOBUTROL 1 MMOL/ML IV SOLN COMPARISON:  Right upper quadrant ultrasound, 12/31/2020 FINDINGS: Examination is generally somewhat limited by breath motion artifact, particularly on contrast enhanced sequences. Lower chest: No acute findings. Hepatobiliary: No mass or other parenchymal abnormality identified. Relatively contracted gallbladder containing multiple gallstones. There is gross intra and extrahepatic biliary ductal dilatation, the common bile duct measuring up to 2.1 cm centrally (series 3, image 16) with numerous gallstones (>10) of varying sizes present in the pancreatic portion of the common bile duct, the largest near the ampulla measuring at least 1.3 cm (series 3, image 16). Pancreas: No mass, inflammatory changes, or other parenchymal abnormality identified. No pancreatic ductal dilatation. Spleen:  Within normal limits in size and appearance. Adrenals/Urinary Tract: No masses identified. Simple, benign bilateral renal cortical and parapelvic cysts. No evidence of hydronephrosis. Stomach/Bowel: Visualized portions within the abdomen are unremarkable. Vascular/Lymphatic: No pathologically enlarged lymph nodes identified. No abdominal aortic aneurysm demonstrated.  Other:  None. Musculoskeletal: No suspicious bone lesions identified. IMPRESSION: 1. There is gross  intra and extrahepatic biliary ductal dilatation, the common bile duct measuring up to 2.1 cm centrally with numerous gallstones (>10) present in the pancreatic portion of the common bile duct, the largest near the ampulla measuring at least 1.3 cm. Findings are consistent with choledocholithiasis. 2. Relatively contracted gallbladder containing multiple additional gallstones. Electronically Signed   By: Lauralyn Primes M.D.   On: 02/04/2021 11:35   DG ERCP  Result Date: 02/06/2021 CLINICAL DATA:  Choledocholithiasis. EXAM: ERCP TECHNIQUE: Multiple spot images obtained with the fluoroscopic device and submitted for interpretation post-procedure. COMPARISON:  MRI/MRCP on 02/04/2021 FINDINGS: Imaging with a C-arm during the procedure demonstrates cannulation of the common bile duct with contrast injection demonstrating a diffusely dilated common bile duct packed with a multitude calculi. Unclear by submitted imaging whether stone extraction was performed. A biliary stent was placed extending from the confluence of right and left intrahepatic ducts into the duodenum. IMPRESSION: Diffusely dilated common bile duct packed with multiple calculi. During the procedure a biliary stent was placed. These images were submitted for radiologic interpretation only. Please see the procedural report for the amount of contrast and the fluoroscopy time utilized. Electronically Signed   By: Irish Lack M.D.   On: 02/06/2021 11:14   MR ABDOMEN MRCP W WO CONTAST  Result Date: 02/04/2021 CLINICAL DATA:  Jaundice, upper abdominal pain, abnormal ultrasound, malignancy suspected EXAM: MRI ABDOMEN WITHOUT AND WITH CONTRAST (INCLUDING MRCP) TECHNIQUE: Multiplanar multisequence MR imaging of the abdomen was performed both before and after the administration of intravenous contrast. Heavily T2-weighted images of the biliary and pancreatic  ducts were obtained, and three-dimensional MRCP images were rendered by post processing. CONTRAST:  59mL GADAVIST GADOBUTROL 1 MMOL/ML IV SOLN COMPARISON:  Right upper quadrant ultrasound, 12/31/2020 FINDINGS: Examination is generally somewhat limited by breath motion artifact, particularly on contrast enhanced sequences. Lower chest: No acute findings. Hepatobiliary: No mass or other parenchymal abnormality identified. Relatively contracted gallbladder containing multiple gallstones. There is gross intra and extrahepatic biliary ductal dilatation, the common bile duct measuring up to 2.1 cm centrally (series 3, image 16) with numerous gallstones (>10) of varying sizes present in the pancreatic portion of the common bile duct, the largest near the ampulla measuring at least 1.3 cm (series 3, image 16). Pancreas: No mass, inflammatory changes, or other parenchymal abnormality identified. No pancreatic ductal dilatation. Spleen:  Within normal limits in size and appearance. Adrenals/Urinary Tract: No masses identified. Simple, benign bilateral renal cortical and parapelvic cysts. No evidence of hydronephrosis. Stomach/Bowel: Visualized portions within the abdomen are unremarkable. Vascular/Lymphatic: No pathologically enlarged lymph nodes identified. No abdominal aortic aneurysm demonstrated. Other:  None. Musculoskeletal: No suspicious bone lesions identified. IMPRESSION: 1. There is gross intra and extrahepatic biliary ductal dilatation, the common bile duct measuring up to 2.1 cm centrally with numerous gallstones (>10) present in the pancreatic portion of the common bile duct, the largest near the ampulla measuring at least 1.3 cm. Findings are consistent with choledocholithiasis. 2. Relatively contracted gallbladder containing multiple additional gallstones. Electronically Signed   By: Lauralyn Primes M.D.   On: 02/04/2021 11:35    Microbiology: Recent Results (from the past 240 hour(s))  Resp Panel by RT-PCR  (Flu A&B, Covid) Nasopharyngeal Swab     Status: None   Collection Time: 02/04/21  1:10 PM   Specimen: Nasopharyngeal Swab; Nasopharyngeal(NP) swabs in vial transport medium  Result Value Ref Range Status   SARS Coronavirus 2 by RT PCR NEGATIVE NEGATIVE Final    Comment: (NOTE) SARS-CoV-2 target  nucleic acids are NOT DETECTED.  The SARS-CoV-2 RNA is generally detectable in upper respiratory specimens during the acute phase of infection. The lowest concentration of SARS-CoV-2 viral copies this assay can detect is 138 copies/mL. A negative result does not preclude SARS-Cov-2 infection and should not be used as the sole basis for treatment or other patient management decisions. A negative result may occur with  improper specimen collection/handling, submission of specimen other than nasopharyngeal swab, presence of viral mutation(s) within the areas targeted by this assay, and inadequate number of viral copies(<138 copies/mL). A negative result must be combined with clinical observations, patient history, and epidemiological information. The expected result is Negative.  Fact Sheet for Patients:  BloggerCourse.comhttps://www.fda.gov/media/152166/download  Fact Sheet for Healthcare Providers:  SeriousBroker.ithttps://www.fda.gov/media/152162/download  This test is no t yet approved or cleared by the [place] FDA and  has been authorized for detection and/or diagnosis of SARS-CoV-2 by FDA under an Emergency Use Authorization (EUA). This EUA will remain  in effect (meaning this test can be used) for the duration of the COVID-19 declaration under Section 564(b)(1) of the Act, 21 U.S.C.section 360bbb-3(b)(1), unless the authorization is terminated  or revoked sooner.       Influenza A by PCR NEGATIVE NEGATIVE Final   Influenza B by PCR NEGATIVE NEGATIVE Final    Comment: (NOTE) The Xpert Xpress SARS-CoV-2/FLU/RSV plus assay is intended as an aid in the diagnosis of influenza from Nasopharyngeal swab specimens  and should not be used as a sole basis for treatment. Nasal washings and aspirates are unacceptable for Xpert Xpress SARS-CoV-2/FLU/RSV testing.  Fact Sheet for Patients: BloggerCourse.comhttps://www.fda.gov/media/152166/download  Fact Sheet for Healthcare Providers: SeriousBroker.ithttps://www.fda.gov/media/152162/download  This test is not yet approved or cleared by the [place] FDA and has been authorized for detection and/or diagnosis of SARS-CoV-2 by FDA under an Emergency Use Authorization (EUA). This EUA will remain in effect (meaning this test can be used) for the duration of the COVID-19 declaration under Section 564(b)(1) of the Act, 21 U.S.C. section 360bbb-3(b)(1), unless the authorization is terminated or revoked.  Performed at [facility], [address] [place], [place] [id number]      Labs: Basic Metabolic Panel: Recent Labs  Lab 02/04/21 0912 02/05/21 0340 02/06/21 0312 02/07/21 0402  NA 135 137 136 135  K 3.6 3.6 3.4* 4.0  CL 101 104 104 105  CO2 27 27 25 24   GLUCOSE 138* 87 90 131*  BUN 20 14 13 15   CREATININE 0.82 0.64 0.72 0.68  CALCIUM 8.8* 8.8* 8.9 9.1  MG  --  1.9 1.9 1.9   Liver Function Tests: Recent Labs  Lab 02/04/21 0912 02/05/21 0340 02/06/21 0312 02/07/21 0402  AST 273* 218* 188* 111*  ALT 192* 174* 165* 128*  ALKPHOS 740* 633* 644* 596*  BILITOT 7.4* 5.8* 4.6* 2.8*  PROT 7.9 7.0 7.1 7.0  ALBUMIN 2.9* 2.6* 2.6* 2.5*   Recent Labs  Lab 02/04/21 0912  LIPASE 24   No results for input(s): AMMONIA in the last 168 hours. CBC: Recent Labs  Lab 02/04/21 0912 02/05/21 0340 02/06/21 0312 02/07/21 0402  WBC 9.7 8.0 7.6 9.4  NEUTROABS 8.2* 6.0 5.4 8.0*  HGB 12.4* 11.8* 12.0* 12.1*  HCT 33.6* 37.0* 37.8* 37.7*  MCV 107.0* 102.8* 103.8* 101.1*  PLT 223 192 214 243   Cardiac Enzymes: No results for input(s): CKTOTAL, CKMB, CKMBINDEX, TROPONINI in the last 168 hours. BNP: Invalid input(s): POCBNP CBG: No results for input(s): GLUCAP in the last 168  hours.  Time coordinating discharge:  36 minutes  Signed:  Catarina Hartshorn, DO [facility] Pager: [phone] 02/07/2021, 7:28 AM

## 2021-02-07 NOTE — Telephone Encounter (Signed)
Received call from [facility]. Dr. Samuel Jester wants patient to follow up with is in 3 weeks. Had ERCP/sten placement yesterday. Patient scheduled for new patient appt 05/02/2021 with Tana Coast but requests it be moved up. Nothing available. Please add patient to cancellation list. FYI to Ermalinda Memos as well as she saw him while inpatient

## 2021-02-07 NOTE — Telephone Encounter (Addendum)
He will now technically be an established patient as we have seen him in the hospital. Can be seen by APP or Physician. We need to get him moved up to be seen in 3-4 weeks as he will need repeat ERCP.   Dr. Karilyn Cota, does patient need follow-up in the office with [place] prior to arranging repeat ERCP. I am not sure we will have any openings to get this arranged.

## 2021-02-09 ENCOUNTER — Inpatient Hospital Stay (HOSPITAL_COMMUNITY)
Admission: EM | Admit: 2021-02-09 | Discharge: 2021-02-12 | DRG: 871 | Disposition: A | Discharge status: Home / Self Care | Payer: Medicare Other | Attending: Family Medicine | Admitting: Family Medicine

## 2021-02-09 ENCOUNTER — Emergency Department (HOSPITAL_COMMUNITY): Payer: Medicare Other

## 2021-02-09 ENCOUNTER — Encounter (HOSPITAL_COMMUNITY): Payer: Self-pay

## 2021-02-09 ENCOUNTER — Other Ambulatory Visit: Payer: Self-pay

## 2021-02-09 DIAGNOSIS — R1110 Vomiting, unspecified: Secondary | ICD-10-CM | POA: Diagnosis not present

## 2021-02-09 DIAGNOSIS — Z79899 Other long term (current) drug therapy: Secondary | ICD-10-CM | POA: Diagnosis not present

## 2021-02-09 DIAGNOSIS — K851 Biliary acute pancreatitis without necrosis or infection: Secondary | ICD-10-CM | POA: Diagnosis not present

## 2021-02-09 DIAGNOSIS — K8309 Other cholangitis: Secondary | ICD-10-CM | POA: Diagnosis not present

## 2021-02-09 DIAGNOSIS — D509 Iron deficiency anemia, unspecified: Secondary | ICD-10-CM | POA: Diagnosis not present

## 2021-02-09 DIAGNOSIS — Z8249 Family history of ischemic heart disease and other diseases of the circulatory system: Secondary | ICD-10-CM

## 2021-02-09 DIAGNOSIS — K8050 Calculus of bile duct without cholangitis or cholecystitis without obstruction: Secondary | ICD-10-CM | POA: Diagnosis present

## 2021-02-09 DIAGNOSIS — K831 Obstruction of bile duct: Secondary | ICD-10-CM | POA: Diagnosis present

## 2021-02-09 DIAGNOSIS — Z20822 Contact with and (suspected) exposure to covid-19: Secondary | ICD-10-CM | POA: Diagnosis present

## 2021-02-09 DIAGNOSIS — K8031 Calculus of bile duct with cholangitis, unspecified, with obstruction: Secondary | ICD-10-CM

## 2021-02-09 DIAGNOSIS — A419 Sepsis, unspecified organism: Principal | ICD-10-CM | POA: Diagnosis present

## 2021-02-09 DIAGNOSIS — R7401 Elevation of levels of liver transaminase levels: Secondary | ICD-10-CM | POA: Diagnosis not present

## 2021-02-09 DIAGNOSIS — I10 Essential (primary) hypertension: Secondary | ICD-10-CM | POA: Diagnosis present

## 2021-02-09 DIAGNOSIS — E871 Hypo-osmolality and hyponatremia: Secondary | ICD-10-CM | POA: Diagnosis present

## 2021-02-09 HISTORY — DX: Essential (primary) hypertension: I10

## 2021-02-09 LAB — CBC WITH DIFFERENTIAL/PLATELET
Abs Immature Granulocytes: 0.16 10*3/uL — ABNORMAL HIGH (ref 0.00–0.07)
Basophils Absolute: 0.1 10*3/uL (ref 0.0–0.1)
Basophils Relative: 0 %
Eosinophils Absolute: 0 10*3/uL (ref 0.0–0.5)
Eosinophils Relative: 0 %
HCT: 37.3 % — ABNORMAL LOW (ref 39.0–52.0)
Hemoglobin: 12.4 g/dL — ABNORMAL LOW (ref 13.0–17.0)
Immature Granulocytes: 1 %
Lymphocytes Relative: 5 %
Lymphs Abs: 1 10*3/uL (ref 0.7–4.0)
MCH: 34.1 pg — ABNORMAL HIGH (ref 26.0–34.0)
MCHC: 33.2 g/dL (ref 30.0–36.0)
MCV: 102.5 fL — ABNORMAL HIGH (ref 80.0–100.0)
Monocytes Absolute: 1.5 10*3/uL — ABNORMAL HIGH (ref 0.1–1.0)
Monocytes Relative: 8 %
Neutro Abs: 16.2 10*3/uL — ABNORMAL HIGH (ref 1.7–7.7)
Neutrophils Relative %: 86 %
Platelets: 199 10*3/uL (ref 150–400)
RBC: 3.64 MIL/uL — ABNORMAL LOW (ref 4.22–5.81)
RDW: 14.8 % (ref 11.5–15.5)
WBC: 18.8 10*3/uL — ABNORMAL HIGH (ref 4.0–10.5)
nRBC: 0 % (ref 0.0–0.2)

## 2021-02-09 LAB — COMPREHENSIVE METABOLIC PANEL
ALT: 83 U/L — ABNORMAL HIGH (ref 0–44)
AST: 53 U/L — ABNORMAL HIGH (ref 15–41)
Albumin: 2.6 g/dL — ABNORMAL LOW (ref 3.5–5.0)
Alkaline Phosphatase: 470 U/L — ABNORMAL HIGH (ref 38–126)
Anion gap: 6 (ref 5–15)
BUN: 21 mg/dL (ref 8–23)
CO2: 24 mmol/L (ref 22–32)
Calcium: 8.8 mg/dL — ABNORMAL LOW (ref 8.9–10.3)
Chloride: 101 mmol/L (ref 98–111)
Creatinine, Ser: 0.72 mg/dL (ref 0.61–1.24)
GFR, Estimated: >60 mL/min (ref >60)
Glucose, Bld: 94 mg/dL (ref 70–99)
Potassium: 3.6 mmol/L (ref 3.5–5.1)
Sodium: 131 mmol/L — ABNORMAL LOW (ref 135–145)
Total Bilirubin: 3.3 mg/dL — ABNORMAL HIGH (ref 0.3–1.2)
Total Protein: 7.5 g/dL (ref 6.5–8.1)

## 2021-02-09 LAB — LACTIC ACID, PLASMA
Lactic Acid, Venous: 1 mmol/L (ref 0.5–1.9)
Lactic Acid, Venous: 0.9 mmol/L (ref 0.5–1.9)

## 2021-02-09 LAB — URINALYSIS, ROUTINE W REFLEX MICROSCOPIC
Bacteria, UA: NONE SEEN
Bilirubin Urine: NEGATIVE
Glucose, UA: NEGATIVE mg/dL
Ketones, ur: NEGATIVE mg/dL
Leukocytes,Ua: NEGATIVE
Nitrite: NEGATIVE
Protein, ur: 30 mg/dL — AB
RBC / HPF: >50 RBC/hpf — ABNORMAL HIGH (ref 0–5)
Specific Gravity, Urine: 1.021 (ref 1.005–1.030)
pH: 5 (ref 5.0–8.0)

## 2021-02-09 LAB — LIPASE, BLOOD: Lipase: 63 U/L — ABNORMAL HIGH (ref 11–51)

## 2021-02-09 MED ORDER — SODIUM CHLORIDE 0.9% FLUSH: 3.0000 mL | INTRAVENOUS | Status: DC | PRN

## 2021-02-09 MED ORDER — ONDANSETRON HCL 4 MG/2ML IJ SOLN: 4.0000 mg | Freq: Four times a day (QID) | INTRAMUSCULAR | Status: DC | PRN

## 2021-02-09 MED ORDER — TRAZODONE HCL 50 MG PO TABS
50.0000 mg | ORAL_TABLET | Freq: Every evening | ORAL | Status: DC | PRN
  Administered 2021-02-10 – 2021-02-11 (×2): 50 mg via ORAL
  Filled 2021-02-09 (×3): qty 1

## 2021-02-09 MED ORDER — SODIUM CHLORIDE 0.9 % IV SOLN
2.0000 g | Freq: Once | INTRAVENOUS | Status: AC
  Administered 2021-02-09: 2 g via INTRAVENOUS
  Filled 2021-02-09: qty 2

## 2021-02-09 MED ORDER — SODIUM CHLORIDE 0.9% FLUSH
3.0000 mL | Freq: Two times a day (BID) | INTRAVENOUS | Status: DC
  Administered 2021-02-11 – 2021-02-12 (×2): 3 mL via INTRAVENOUS

## 2021-02-09 MED ORDER — METRONIDAZOLE 500 MG/100ML IV SOLN
500.0000 mg | Freq: Three times a day (TID) | INTRAVENOUS | Status: DC
  Administered 2021-02-09 – 2021-02-12 (×8): 500 mg via INTRAVENOUS
  Filled 2021-02-09 (×8): qty 100

## 2021-02-09 MED ORDER — SODIUM CHLORIDE 0.9 % IV SOLN: 250.0000 mL | INTRAVENOUS | Status: DC | PRN

## 2021-02-09 MED ORDER — AMLODIPINE BESYLATE 5 MG PO TABS
5.0000 mg | ORAL_TABLET | Freq: Every day | ORAL | Status: DC
  Administered 2021-02-09 – 2021-02-12 (×4): 5 mg via ORAL
  Filled 2021-02-09 (×4): qty 1

## 2021-02-09 MED ORDER — DEXTROSE-NACL 5-0.9 % IV SOLN: INTRAVENOUS | Status: DC

## 2021-02-09 MED ORDER — OXYCODONE HCL 5 MG PO TABS: 5.0000 mg | ORAL_TABLET | ORAL | Status: DC | PRN

## 2021-02-09 MED ORDER — METRONIDAZOLE 500 MG/100ML IV SOLN
500.0000 mg | Freq: Once | INTRAVENOUS | Status: AC
  Administered 2021-02-09: 500 mg via INTRAVENOUS
  Filled 2021-02-09: qty 100

## 2021-02-09 MED ORDER — BISACODYL 10 MG RE SUPP: 10.0000 mg | Freq: Every day | RECTAL | Status: DC | PRN

## 2021-02-09 MED ORDER — SODIUM CHLORIDE 0.9% FLUSH
3.0000 mL | Freq: Two times a day (BID) | INTRAVENOUS | Status: DC
  Administered 2021-02-09 – 2021-02-12 (×3): 3 mL via INTRAVENOUS

## 2021-02-09 MED ORDER — ACETAMINOPHEN 650 MG RE SUPP: 650.0000 mg | Freq: Four times a day (QID) | RECTAL | Status: DC | PRN

## 2021-02-09 MED ORDER — POLYETHYLENE GLYCOL 3350 17 G PO PACK
17.0000 g | PACK | Freq: Every day | ORAL | Status: DC | PRN
  Administered 2021-02-12: 17 g via ORAL
  Filled 2021-02-09: qty 1

## 2021-02-09 MED ORDER — CEFEPIME HCL 2 G IJ SOLR
2.0000 g | Freq: Three times a day (TID) | INTRAMUSCULAR | Status: DC
  Administered 2021-02-09 – 2021-02-12 (×9): 2 g via INTRAVENOUS
  Filled 2021-02-09 (×8): qty 2

## 2021-02-09 MED ORDER — HEPARIN SODIUM (PORCINE) 5000 UNIT/ML IJ SOLN
5000.0000 [IU] | Freq: Three times a day (TID) | INTRAMUSCULAR | Status: DC
  Administered 2021-02-09 – 2021-02-12 (×8): 5000 [IU] via SUBCUTANEOUS
  Filled 2021-02-09 (×9): qty 1

## 2021-02-09 MED ORDER — IOHEXOL 350 MG/ML SOLN
80.0000 mL | Freq: Once | INTRAVENOUS | Status: AC | PRN
  Administered 2021-02-09: 80 mL via INTRAVENOUS

## 2021-02-09 MED ORDER — SODIUM CHLORIDE 0.9 % IV BOLUS (SEPSIS)
1000.0000 mL | Freq: Once | INTRAVENOUS | Status: AC
  Administered 2021-02-09: 1000 mL via INTRAVENOUS

## 2021-02-09 MED ORDER — LACTATED RINGERS IV SOLN: INTRAVENOUS | Status: DC

## 2021-02-09 MED ORDER — ONDANSETRON HCL 4 MG PO TABS: 4.0000 mg | ORAL_TABLET | Freq: Four times a day (QID) | ORAL | Status: DC | PRN

## 2021-02-09 MED ORDER — ACETAMINOPHEN 325 MG PO TABS: 650.0000 mg | ORAL_TABLET | Freq: Four times a day (QID) | ORAL | Status: DC | PRN

## 2021-02-09 MED ORDER — FENTANYL CITRATE PF 50 MCG/ML IJ SOSY: 25.0000 ug | PREFILLED_SYRINGE | INTRAMUSCULAR | Status: DC | PRN

## 2021-02-09 MED ORDER — SODIUM CHLORIDE 0.9 % IV SOLN: INTRAVENOUS | Status: DC

## 2021-02-09 NOTE — Consult Note (Addendum)
Consulting  Provider: Dr. Rhunette Croft Primary Care Physician:  Mechele Claude, MD Primary Gastroenterologist:  Dr.Rehman  Reason for Consultation:  Post ERCP pancreatitis  HPI:  Dean Randolph is a 80 y.o. male with minimal past medical history besides hypertension, recent admission to our facility for choledocholithiasis status post ERCP 02/06/2021.  ERCP revealed numerous stones in the common bile duct some of which were unable to be removed with balloon sweep after sphincterotomy.  Plastic stent was subsequently placed.    Presented back to [facility], ER early this a.m. with worsening abdominal pain, reported fevers and night sweats, nausea without vomiting. Abdominal pain moderate, does not radiate. Work-up in the ER revealed leukocytosis of 18.8, tachycardia.  LFTs are actually slightly improved compared to discharge.  Lipase very mildly elevated at 63.  Underwent CT abdomen pelvis which showed fat stranding around the pancreas concerning for acute pancreatitis.  Patient has been started on IV fluids and empiric antibiotics.  No alcohol use or drug use.  No previous history of pancreatitis.  No tobacco use.  No new medications.  Past Medical History:  Diagnosis Date   Hypertension     Past Surgical History:  Procedure Laterality Date   BILIARY STENT PLACEMENT N/A 02/06/2021   Procedure: BILIARY STENT PLACEMENT;  Surgeon: Malissa Hippo, MD;  Location: [facility];  Service: Gastroenterology;  Laterality: N/A;   ERCP N/A 02/06/2021   Procedure: ENDOSCOPIC RETROGRADE CHOLANGIOPANCREATOGRAPHY (ERCP);  Surgeon: Malissa Hippo, MD;  Location: [facility];  Service: Gastroenterology;  Laterality: N/A;   SPHINCTEROTOMY N/A 02/06/2021   Procedure: SPHINCTEROTOMY;  Surgeon: Malissa Hippo, MD;  Location: [facility];  Service: Gastroenterology;  Laterality: N/A;    Prior to Admission medications   Medication Sig Start Date End Date Taking? Authorizing Provider  acetaminophen (TYLENOL) 325 MG tablet  Take 650 mg by mouth every 6 (six) hours as needed for headache or mild pain.   Yes [provider]  amLODipine (NORVASC) 5 MG tablet Take 1 tablet (5 mg total) by mouth daily. 02/07/21  Yes TatOnalee Hua, MD    Current Facility-Administered Medications  Medication Dose Route Frequency Provider Last Rate Last Admin   0.9 %  sodium chloride infusion  250 mL Intravenous PRN Mariea Clonts, Courage, MD       acetaminophen (TYLENOL) tablet 650 mg  650 mg Oral Q6H PRN Emokpae, Courage, MD       Or   acetaminophen (TYLENOL) suppository 650 mg  650 mg Rectal Q6H PRN Emokpae, Courage, MD       amLODipine (NORVASC) tablet 5 mg  5 mg Oral Daily Emokpae, Courage, MD   5 mg at 02/09/21 1804   bisacodyl (DULCOLAX) suppository 10 mg  10 mg Rectal Daily PRN Emokpae, Courage, MD       ceFEPIme (MAXIPIME) 2 g in sodium chloride 0.9 % 100 mL IVPB  2 g Intravenous Q8H Emokpae, Courage, MD   Stopped at 02/09/21 1841   dextrose 5 %-0.9 % sodium chloride infusion   Intravenous Continuous Emokpae, Courage, MD 100 mL/hr at 02/09/21 1804 New Bag at 02/09/21 1804   fentaNYL (SUBLIMAZE) injection 25 mcg  25 mcg Intravenous Q3H PRN Emokpae, Courage, MD       heparin injection 5,000 Units  5,000 Units Subcutaneous Q8H Emokpae, Courage, MD   5,000 Units at 02/09/21 1805   metroNIDAZOLE (FLAGYL) IVPB 500 mg  500 mg Intravenous Q8H Shon Hale, MD   Stopped at 02/09/21 1933   ondansetron (ZOFRAN) tablet 4 mg  4 mg Oral Q6H PRN Emokpae, Courage, MD       Or   ondansetron (ZOFRAN) injection 4 mg  4 mg Intravenous Q6H PRN Emokpae, Courage, MD       oxyCODONE (Oxy IR/ROXICODONE) immediate release tablet 5 mg  5 mg Oral Q4H PRN Emokpae, Courage, MD       polyethylene glycol (MIRALAX / GLYCOLAX) packet 17 g  17 g Oral Daily PRN Emokpae, Courage, MD       sodium chloride flush (NS) 0.9 % injection 3 mL  3 mL Intravenous Q12H Emokpae, Courage, MD       sodium chloride flush (NS) 0.9 % injection 3 mL  3 mL Intravenous Q12H  Emokpae, Courage, MD       sodium chloride flush (NS) 0.9 % injection 3 mL  3 mL Intravenous PRN Emokpae, Courage, MD       traZODone (DESYREL) tablet 50 mg  50 mg Oral QHS PRN Emokpae, Courage, MD       Current Outpatient Medications  Medication Sig Dispense Refill   acetaminophen (TYLENOL) 325 MG tablet Take 650 mg by mouth every 6 (six) hours as needed for headache or mild pain.     amLODipine (NORVASC) 5 MG tablet Take 1 tablet (5 mg total) by mouth daily. 30 tablet 1    Allergies as of 02/09/2021   (No Known Allergies)    No family history on file.  Social History   Socioeconomic History   Marital status: Married    Spouse name: Not on file   Number of children: Not on file   Years of education: Not on file   Highest education level: Not on file  Occupational History   Occupation: electrician    Comment: retired  Tobacco Use   Smoking status: Never    Passive exposure: Past   Smokeless tobacco: Never  Substance and Sexual Activity   Alcohol use: Never   Drug use: Never   Sexual activity: Not on file  Other Topics Concern   Not on file  Social History Narrative   Not on file   Social Determinants of Health   Financial Resource Strain: Not on file  Food Insecurity: Not on file  Transportation Needs: Not on file  Physical Activity: Not on file  Stress: Not on file  Social Connections: Not on file  Intimate Partner Violence: Not on file    Review of Systems: General: Negative for anorexia, weight loss, fever, chills, fatigue, weakness. Eyes: Negative for vision changes.  ENT: Negative for hoarseness, difficulty swallowing , nasal congestion. CV: Negative for chest pain, angina, palpitations, dyspnea on exertion, peripheral edema.  Respiratory: Negative for dyspnea at rest, dyspnea on exertion, cough, sputum, wheezing.  GI: See history of present illness. GU:  Negative for dysuria, hematuria, urinary incontinence, urinary frequency, nocturnal urination.  MS:  Negative for joint pain, low back pain.  Derm: Negative for rash or itching.  Neuro: Negative for weakness, abnormal sensation, seizure, frequent headaches, memory loss, confusion.  Psych: Negative for anxiety, depression Endo: Negative for unusual weight change.  Heme: Negative for bruising or bleeding. Allergy: Negative for rash or hives.  Physical Exam: Vital signs in last 24 hours: Temp:  [97.7 F (36.5 C)-98.2 F (36.8 C)] 98.2 F (36.8 C) (08/14 1931) Pulse Rate:  [78-101] 85 (08/14 1930) Resp:  [17-23] 21 (08/14 1933) BP: (113-146)/(61-75) 138/71 (08/14 1933) SpO2:  [97 %-100 %] 100 % (08/14 1933) Weight:  [69.8 kg] 69.8 kg (08/14 0634)  General:   Alert,  Well-developed, well-nourished, pleasant and cooperative in NAD Head:  Normocephalic and atraumatic. Eyes:  Sclera clear, no icterus.   Conjunctiva pink. Ears:  Normal auditory acuity. Nose:  No deformity, discharge,  or lesions. Mouth:  No deformity or lesions, dentition normal. Neck:  Supple; no masses or thyromegaly. Lungs:  Clear throughout to auscultation.   No wheezes, crackles, or rhonchi. No acute distress. Heart:  Regular rate and rhythm; no murmurs, clicks, rubs,  or gallops. Abdomen:  Soft, tender to palpation epigastric and RUQ, nondistended. No masses, hepatosplenomegaly or hernias noted. Normal bowel sounds, without guarding, and without rebound.   Msk:  Symmetrical without gross deformities. Normal posture. Pulses:  Normal pulses noted. Extremities:  Without clubbing or edema. Neurologic:  Alert and  oriented x4;  grossly normal neurologically. Skin:  Intact without significant lesions or rashes. Cervical Nodes:  No significant cervical adenopathy. Psych:  Alert and cooperative. Normal mood and affect.  Intake/Output from previous day: No intake/output data recorded. Intake/Output this shift: Total I/O In: 100 [IV Piggyback:100] Out: 250 [Urine:250]  Lab Results: Recent Labs    02/07/21 0402  02/09/21 0743  WBC 9.4 18.8*  HGB 12.1* 12.4*  HCT 37.7* 37.3*  PLT 243 199   BMET Recent Labs    02/07/21 0402 02/09/21 0743  NA 135 131*  K 4.0 3.6  CL 105 101  CO2 24 24  GLUCOSE 131* 94  BUN 15 21  CREATININE 0.68 0.72  CALCIUM 9.1 8.8*   LFT Recent Labs    02/07/21 0402 02/09/21 0743  PROT 7.0 7.5  ALBUMIN 2.5* 2.6*  AST 111* 53*  ALT 128* 83*  ALKPHOS 596* 470*  BILITOT 2.8* 3.3*   PT/INR No results for input(s): LABPROT, INR in the last 72 hours. Hepatitis Panel No results for input(s): HEPBSAG, HCVAB, HEPAIGM, HEPBIGM in the last 72 hours. C-Diff No results for input(s): CDIFFTOX in the last 72 hours.  Studies/Results: CT ABDOMEN PELVIS W CONTRAST  Result Date: 02/09/2021 CLINICAL DATA:  Nausea and vomiting. Rising bilirubin. Known choledocholithiasis. EXAM: CT ABDOMEN AND PELVIS WITH CONTRAST TECHNIQUE: Multidetector CT imaging of the abdomen and pelvis was performed using the standard protocol following bolus administration of intravenous contrast. CONTRAST:  41mL OMNIPAQUE IOHEXOL 350 MG/ML SOLN COMPARISON:  MRI February 04, 2021.  Ultrasound December 31, 2020. FINDINGS: Lower chest: There is a small hiatal hernia. No other abnormalities in the lower lungs or chest. Hepatobiliary: The gallbladder is relatively contracted with mucosal enhancement towards the fundus. There is a focus of air in the gallbladder. A biliary stent is seen in the common bile duct. Pneumobilia is identified consistent with the stent and recent ERCP. Mild intrahepatic ductal dilatation remains. No choledocholithiasis identified on this study. A few scattered low-attenuation lesions in the liver too small to characterize but likely tiny cysts. The largest cyst is seen in the right hepatic lobe on axial image 27. No suspicious liver masses. Pancreas: There is fat stranding surrounding the length of the pancreas without fluid collection or pancreatic necrosis identified. Spleen: Normal in size  without focal abnormality. Adrenals/Urinary Tract: Adrenal glands are normal. Bilateral renal cysts are identified. Parapelvic cysts are associated with both kidneys. No hydronephrosis. No ureteral stones are noted. The bladder is unremarkable. Stomach/Bowel: Other than the hiatal hernia, the stomach is normal. The small bowel is normal. The colon and appendix are unremarkable. Vascular/Lymphatic: Calcified atherosclerosis is seen in the nonaneurysmal abdominal aorta, extending into the iliac and femoral vessels. A  few reactive nodes are seen in the porta hepatis. No other abnormal nodes identified. Reproductive: Prostate is unremarkable. Other: No abdominal wall hernia or abnormality. No abdominopelvic ascites. Musculoskeletal: No acute or significant osseous findings. IMPRESSION: 1. Diffuse fat stranding around the pancreas most consistent with pancreatitis. Recommend correlation with lipase. 2. A common bile duct stent is identified without identified choledocholithiasis. Mild intrahepatic ductal dilatation remains. Air in the gallbladder and pneumobilia are consistent with recent ERCP and the common bile duct stent. 3. Mild enhancement in the gallbladder mucosa towards the fundus suggests hyperemia and probably some inflammation. 4. Small hiatal hernia. 5. Calcified atherosclerosis in the abdominal aorta, iliac vessels, and femoral vessels. Electronically Signed   By: Gerome Sam III M.D.   On: 02/09/2021 12:04    Impression: *Acute pancreatitis *Sepsis *Abdominal pain due to above *Abnormal liver function tests  Plan: Patient presenting with what appears to be acute pancreatitis.  Possible gallstone pancreatitis versus post ERCP induced.    Agree with IV fluid resuscitation.    Continue on antiemetics and analgesia as needed.  Leukocytosis could be explained by pancreatitis itself though agree with antibiotics for now until infectious work-up completed.  Continue to monitor daily CMP.  GI  to continue to follow.  Hennie Duos. Marletta Lor, D.O. Gastroenterology and Hepatology [facility]    LOS: 0 days     02/09/2021, 8:15 PM

## 2021-02-09 NOTE — ED Provider Notes (Signed)
[facility] EMERGENCY DEPARTMENT Provider Note   CSN: [id number] Arrival date & time: 02/09/21  9163     History Chief Complaint  Patient presents with   Abdominal Pain    Dean Randolph is a 80 y.o. male.  HPI     80 year old male comes in with chief complaint of abdominal pain and sweats.  He has recent history of cholangitis, choledocholithiasis status post ERCP.  Patient woke up this morning with some sweats, malaise and chills.  He was advised to come to the ER if there is any changes like this therefore he is here.  No fevers noted in the ER.  Patient reports that he has no significant abdominal pain.  Past Medical History:  Diagnosis Date   Hypertension     Patient Active Problem List   Diagnosis Date Noted   Acute cholangitis 02/09/2021   Essential hypertension 02/07/2021   Calculus of bile duct with cholangitis and obstruction    Choledocholithiasis 02/04/2021   Hyperglycemia 02/04/2021   Intermittent abdominal pain 02/04/2021   Early satiety 02/04/2021   Abnormal weight loss 02/04/2021   Hyperbilirubinemia 02/04/2021   Elevated liver enzymes 02/04/2021   Hematemesis 02/04/2021   Iron deficiency anemia    Transaminitis     Past Surgical History:  Procedure Laterality Date   BILIARY STENT PLACEMENT N/A 02/06/2021   Procedure: BILIARY STENT PLACEMENT;  Surgeon: Malissa Hippo, MD;  Location: [facility];  Service: Gastroenterology;  Laterality: N/A;   ERCP N/A 02/06/2021   Procedure: ENDOSCOPIC RETROGRADE CHOLANGIOPANCREATOGRAPHY (ERCP);  Surgeon: Malissa Hippo, MD;  Location: [facility];  Service: Gastroenterology;  Laterality: N/A;   SPHINCTEROTOMY N/A 02/06/2021   Procedure: SPHINCTEROTOMY;  Surgeon: Malissa Hippo, MD;  Location: [facility];  Service: Gastroenterology;  Laterality: N/A;       No family history on file.  Social History   Tobacco Use   Smoking status: Never    Passive exposure: Past   Smokeless tobacco: Never  Substance Use Topics    Alcohol use: Never   Drug use: Never    Home Medications Prior to Admission medications   Medication Sig Start Date End Date Taking? Authorizing Provider  acetaminophen (TYLENOL) 325 MG tablet Take 650 mg by mouth every 6 (six) hours as needed for headache or mild pain.   Yes [provider]  amLODipine (NORVASC) 5 MG tablet Take 1 tablet (5 mg total) by mouth daily. 02/07/21  Yes TatOnalee Hua, MD    Allergies    Patient has no known allergies.  Review of Systems   Review of Systems  Constitutional:  Positive for activity change, chills, diaphoresis and fatigue.  Respiratory:  Negative for shortness of breath.   Cardiovascular:  Negative for chest pain.  Gastrointestinal:  Negative for nausea and vomiting.  Genitourinary:  Negative for dysuria.  Allergic/Immunologic: Negative for immunocompromised state.  All other systems reviewed and are negative.  Physical Exam Updated Vital Signs BP 133/63   Pulse 80   Temp 98.2 F (36.8 C) (Oral)   Resp (!) 23   Ht 5\' 6"  (1.676 m)   Wt 69.8 kg   SpO2 97%   BMI 24.84 kg/m   Physical Exam Vitals and nursing note reviewed.  Constitutional:      Appearance: He is well-developed.  HENT:     Head: Atraumatic.  Cardiovascular:     Rate and Rhythm: Normal rate.  Pulmonary:     Effort: Pulmonary effort is normal.  Abdominal:  Tenderness: There is abdominal tenderness in the right upper quadrant and epigastric area. There is no guarding or rebound.  Musculoskeletal:     Cervical back: Neck supple.  Skin:    General: Skin is warm.  Neurological:     Mental Status: He is alert and oriented to person, place, and time.    ED Results / Procedures / Treatments   Labs (all labs ordered are listed, but only abnormal results are displayed) Labs Reviewed  COMPREHENSIVE METABOLIC PANEL - Abnormal; Notable for the following components:      Result Value   Sodium 131 (*)    Calcium 8.8 (*)    Albumin 2.6 (*)    AST 53 (*)     ALT 83 (*)    Alkaline Phosphatase 470 (*)    Total Bilirubin 3.3 (*)    All other components within normal limits  CBC WITH DIFFERENTIAL/PLATELET - Abnormal; Notable for the following components:   WBC 18.8 (*)    RBC 3.64 (*)    Hemoglobin 12.4 (*)    HCT 37.3 (*)    MCV 102.5 (*)    MCH 34.1 (*)    Neutro Abs 16.2 (*)    Monocytes Absolute 1.5 (*)    Abs Immature Granulocytes 0.16 (*)    All other components within normal limits  URINALYSIS, ROUTINE W REFLEX MICROSCOPIC - Abnormal; Notable for the following components:   Color, Urine AMBER (*)    APPearance HAZY (*)    Hgb urine dipstick MODERATE (*)    Protein, ur 30 (*)    RBC / HPF >50 (*)    All other components within normal limits  LIPASE, BLOOD - Abnormal; Notable for the following components:   Lipase 63 (*)    All other components within normal limits  CULTURE, BLOOD (ROUTINE X 2)  CULTURE, BLOOD (ROUTINE X 2)  SARS CORONAVIRUS 2 (TAT 6-24 HRS)  LACTIC ACID, PLASMA  LACTIC ACID, PLASMA    EKG None  Radiology CT ABDOMEN PELVIS W CONTRAST  Result Date: 02/09/2021 CLINICAL DATA:  Nausea and vomiting. Rising bilirubin. Known choledocholithiasis. EXAM: CT ABDOMEN AND PELVIS WITH CONTRAST TECHNIQUE: Multidetector CT imaging of the abdomen and pelvis was performed using the standard protocol following bolus administration of intravenous contrast. CONTRAST:  80mL OMNIPAQUE IOHEXOL 350 MG/ML SOLN COMPARISON:  MRI February 04, 2021.  Ultrasound December 31, 2020. FINDINGS: Lower chest: There is a small hiatal hernia. No other abnormalities in the lower lungs or chest. Hepatobiliary: The gallbladder is relatively contracted with mucosal enhancement towards the fundus. There is a focus of air in the gallbladder. A biliary stent is seen in the common bile duct. Pneumobilia is identified consistent with the stent and recent ERCP. Mild intrahepatic ductal dilatation remains. No choledocholithiasis identified on this study. A few scattered  low-attenuation lesions in the liver too small to characterize but likely tiny cysts. The largest cyst is seen in the right hepatic lobe on axial image 27. No suspicious liver masses. Pancreas: There is fat stranding surrounding the length of the pancreas without fluid collection or pancreatic necrosis identified. Spleen: Normal in size without focal abnormality. Adrenals/Urinary Tract: Adrenal glands are normal. Bilateral renal cysts are identified. Parapelvic cysts are associated with both kidneys. No hydronephrosis. No ureteral stones are noted. The bladder is unremarkable. Stomach/Bowel: Other than the hiatal hernia, the stomach is normal. The small bowel is normal. The colon and appendix are unremarkable. Vascular/Lymphatic: Calcified atherosclerosis is seen in the nonaneurysmal abdominal aorta,  extending into the iliac and femoral vessels. A few reactive nodes are seen in the porta hepatis. No other abnormal nodes identified. Reproductive: Prostate is unremarkable. Other: No abdominal wall hernia or abnormality. No abdominopelvic ascites. Musculoskeletal: No acute or significant osseous findings. IMPRESSION: 1. Diffuse fat stranding around the pancreas most consistent with pancreatitis. Recommend correlation with lipase. 2. A common bile duct stent is identified without identified choledocholithiasis. Mild intrahepatic ductal dilatation remains. Air in the gallbladder and pneumobilia are consistent with recent ERCP and the common bile duct stent. 3. Mild enhancement in the gallbladder mucosa towards the fundus suggests hyperemia and probably some inflammation. 4. Small hiatal hernia. 5. Calcified atherosclerosis in the abdominal aorta, iliac vessels, and femoral vessels. Electronically Signed   By: Gerome Sam III M.D.   On: 02/09/2021 12:04    Procedures .Critical Care  Date/Time: 02/09/2021 3:50 PM Performed by: Derwood Kaplan, MD Authorized by: Derwood Kaplan, MD   Critical care provider  statement:    Critical care time (minutes):  52   Critical care was necessary to treat or prevent imminent or life-threatening deterioration of the following conditions:  Hepatic failure   Critical care was time spent personally by me on the following activities:  Discussions with consultants, evaluation of patient's response to treatment, examination of patient, ordering and performing treatments and interventions, ordering and review of laboratory studies, ordering and review of radiographic studies, pulse oximetry, re-evaluation of patient's condition, obtaining history from patient or surrogate and review of old charts   Medications Ordered in ED Medications  ceFEPIme (MAXIPIME) 2 g in sodium chloride 0.9 % 100 mL IVPB (has no administration in time range)  metroNIDAZOLE (FLAGYL) IVPB 500 mg (has no administration in time range)  0.9 %  sodium chloride infusion ( Intravenous New Bag/Given 02/09/21 1355)  sodium chloride 0.9 % bolus 1,000 mL (0 mLs Intravenous Stopped 02/09/21 1032)  ceFEPIme (MAXIPIME) 2 g in sodium chloride 0.9 % 100 mL IVPB (0 g Intravenous Stopped 02/09/21 1032)  metroNIDAZOLE (FLAGYL) IVPB 500 mg (0 mg Intravenous Stopped 02/09/21 1139)  iohexol (OMNIPAQUE) 350 MG/ML injection 80 mL (80 mLs Intravenous Contrast Given 02/09/21 1110)    ED Course  I have reviewed the triage vital signs and the nursing notes.  Pertinent labs & imaging results that were available during my care of the patient were reviewed by me and considered in my medical decision making (see chart for details).    MDM Rules/Calculators/A&P                           80 year old male comes in with chief complaint of sweats, chills and weakness. Status post ERCP from 2 or 3 days ago.  It appears that he still has gallstones and common bile duct obstruction.  On exam patient has no significant abdominal tenderness or peritoneal findings.  Differential diagnosis includes migrated stent,  cholangitis.  Appropriate labs ordered.  Reassessment: Spoke with Dr. Dionicia Abler after the lab results came back.  Bilirubin is slightly elevated, LFTs are stable and elevated.  White count is higher than at the time of discharge.  Dr. Dionicia Abler recommends getting a CT scan.  Given the elevated white count, we have started patient on antibiotics with the presumption that he might have cholangitis.  Lipase is also been added.  Reassessment: Evidence of pancreatitis on the CT scan.  Otherwise it appears stable.  Stable for admission as well.   Final Clinical Impression(s) / ED  Diagnoses Final diagnoses:  Cholangitis  Acute gallstone pancreatitis    Rx / DC Orders ED Discharge Orders     None        Derwood Kaplan, MD 02/09/21 1553

## 2021-02-09 NOTE — ED Triage Notes (Signed)
Pt presents to ED from home. Reports he was discharged 2 days ago for gallstones. Pt says they took some of the stones out, but couldn't get them all and the Dr told him to come back if he started running a fever. Pt says he woke up sweating last night and felt as if he had a fever, so he came back. Pt did not check temp at home. Pt afebrile in triage. Pt reports mild abd pain.

## 2021-02-09 NOTE — ED Notes (Signed)
Marletta Lor (on call GI) paged to [phone]

## 2021-02-09 NOTE — H&P (Signed)
Patient Demographics:    Dean Randolph, is a 80 y.o. male  MRN: [id number]   DOB - 13-May-1941  Admit Date - 02/09/2021  Outpatient Primary MD for the patient is Claretta Fraise, MD   Assessment & Plan:    Principal Problem:   Pancreatitis due to biliary obstruction/S/p ERCP with stent Placement on 02/06/21 Active Problems:   Calculus of bile duct with cholangitis and obstruction   Acute cholangitis   Choledocholithiasis/Cholelithiasis--S/p ERCP with Plastic Stent Placement on 02/06/21   Transaminitis   Hyperbilirubinemia   Iron deficiency anemia   Essential hypertension    1)Acute Pancreatitis due to Biliary Obstruction--- cannot rule out possible post ERCP pancreatitis -But patient still has choledocholithiasis stones were not retrieved during recent ERCP on 02/06/21 -Lipase is up to 63 it was 24 on 02/04/2021 -CT abdomen and pelvis on admission consistent with acute pancreatitis -Bowel rest, -IV fluids -IV antiemetics and pain medications  2)Choledocholithiasis/Cholelithiasis--S/p ERCP with Plastic Stent Placement on 02/06/21----clinical presentation this admission is suspicious for possible ascending cholangitis--patient with chills, night sweats, abdominal pain and nausea as well as new leukocytosis with WBC of 18.8, WBC was 9.4 on 02/07/2021 -Discussed with GI service -We will treat empirically with IV cefepime and Flagyl pending culture data  3)Transaminitis--- secondary to biliary obstruction as above --LFTs are actually trending down compared to recent values -AST is down to 53 from a recent peak of 273 on 02/04/2021 -ALT is down to 83 from a recent peak of 192 on 02/04/2021 -Alk phos is down to 470 from a recent peak of 740 on 02/04/2021 -T bili is down to 3.3 from a recent peak of 7.4 on  02/04/2021  4)Sepsis secondary to presumed ascending cholangitis-- POA -Lactic acid not elevated -Patient met sepsis criteria on admission due to cholangitis with tachycardia, tachypnea and leukocytosis -Continue IV fluids -Cefepime and Flagyl as above pending further culture data  5) iron deficiency anemia anemia--- hemoglobin is 12.4, no ongoing bleeding concerns, anticipate drop in Hgb with IV fluids  6) hyponatremia--sodium is 131, suspect due to dehydration in setting of poor oral intake and persistent nausea IV fluids as ordered  7)HTN--- amlodipine 5 mg as prescribed  Disposition/Need for in-Hospital Stay- patient unable to be discharged at this time due to -acute pancreatitis and possible ascending cholangitis requiring IV antibiotics and IV fluids  Status is: Inpatient  Remains inpatient appropriate because: Please see disposition above  Dispo: The patient is from: Home              Anticipated d/c is to: Home              Anticipated d/c date is: 2 days              Patient currently is not medically stable to d/c. Barriers: Not Clinically Stable-    With History of - Reviewed by me  Past Medical History:  Diagnosis Date   Hypertension  Past Surgical History:  Procedure Laterality Date   BILIARY STENT PLACEMENT N/A 02/06/2021   Procedure: BILIARY STENT PLACEMENT;  Surgeon: Rogene Houston, MD;  Location: [facility];  Service: Gastroenterology;  Laterality: N/A;   ERCP N/A 02/06/2021   Procedure: ENDOSCOPIC RETROGRADE CHOLANGIOPANCREATOGRAPHY (ERCP);  Surgeon: Rogene Houston, MD;  Location: [facility];  Service: Gastroenterology;  Laterality: N/A;   SPHINCTEROTOMY N/A 02/06/2021   Procedure: SPHINCTEROTOMY;  Surgeon: Rogene Houston, MD;  Location: [facility];  Service: Gastroenterology;  Laterality: N/A;      Chief Complaint  Patient presents with   Abdominal Pain      HPI:    Dean Randolph  is a 80 y.o. male with past medical history relevant for HTN who was  recently admitted on 02/04/2021 through 02/07/2021 for choledocholithiasis and cholelithiasis--underwent  ERCP  on 02/06/21--common bile duct and common hepatic duct were moderately dilated containing multiple stones. Stones appeared to be all stacked up and immobile;  sphincterotomy performed and plastic stent placed in CBD - -Patient returns on 02/09/2021 with 24-hour history of nausea ,abdominal pain chills and night sweats -In the ED is found to have new leukocytosis with WBC of 18.8, WBC was 9.4 on 02/07/2021 -He is also tachycardic and tachypneic -LFTs appears to be trending down compared to recent values -Sodium is down to 131 --Lipase is up to 63 it was 24 on 02/04/2021 -CT abdomen and pelvis consistent with acute pancreatitis -EDP discussed case with on-call GI attending who recommends hospitalization for possible IV pancreatitis in the setting of biliary obstruction and status post ERCP, cannot rule out ascending cholangitis -Empiric cefepime and Flagyl were given pending further culture data -UA noted with WBC but no bacteria no leukocyte esterase and nitrite negative - CT abdomen and pelvis on 02/09/2021 shows:- IMPRESSION: 1. Diffuse fat stranding around the pancreas most consistent with pancreatitis. Recommend correlation with lipase. 2. A common bile duct stent is identified without identified choledocholithiasis. Mild intrahepatic ductal dilatation remains. Air in the gallbladder and pneumobilia are consistent with recent ERCP and the common bile duct stent. 3. Mild enhancement in the gallbladder mucosa towards the fundus suggests hyperemia and probably some inflammation. 4. Small hiatal hernia. 5. Calcified atherosclerosis in the abdominal aorta, iliac vessels, and femoral vessels.   Additional history obtained from daughter at bedside Ms [place] of systems:    In addition to the HPI above,   A full Review of  Systems was done, all other systems reviewed are  negative except as noted above in HPI , .    Social History:  Reviewed by me    Social History   Tobacco Use   Smoking status: Never    Passive exposure: Past   Smokeless tobacco: Never  Substance Use Topics   Alcohol use: Never       Family History :  Reviewed by me HTN   Home Medications:   Prior to Admission medications   Medication Sig Start Date End Date Taking? Authorizing Provider  acetaminophen (TYLENOL) 325 MG tablet Take 650 mg by mouth every 6 (six) hours as needed for headache or mild pain.   Yes [provider]  amLODipine (NORVASC) 5 MG tablet Take 1 tablet (5 mg total) by mouth daily. 02/07/21  Yes TatShanon Brow, MD     Allergies:    No Known Allergies   Physical Exam:   Vitals  Blood pressure 134/65, pulse 81, temperature 98.2 F (36.8 C), temperature source  Oral, resp. rate (!) 23, height 5' 6" (1.676 m), weight 69.8 kg, SpO2 97 %.  Physical Examination: General appearance - alert, well appearing, and in no distress  Mental status - alert, oriented to person, place, and time,  Eyes - sclera icterus Neck - supple, no JVD elevation , Chest - clear  to auscultation bilaterally, symmetrical air movement,  Heart - S1 and S2 normal, regular  Abdomen - soft, epigastric left upper quadrant and right upper quadrant area discomfort on palpation without rebound or guarding  neurological - screening mental status exam normal, neck supple without rigidity, cranial nerves II through XII intact, DTR's normal and symmetric Extremities - no pedal edema noted, intact peripheral pulses  Skin - warm, dry     Data Review:    CBC Recent Labs  Lab 02/04/21 0912 02/05/21 0340 02/06/21 0312 02/07/21 0402 02/09/21 0743  WBC 9.7 8.0 7.6 9.4 18.8*  HGB 12.4* 11.8* 12.0* 12.1* 12.4*  HCT 33.6* 37.0* 37.8* 37.7* 37.3*  PLT 223 192 214 243 199  MCV 107.0* 102.8* 103.8* 101.1* 102.5*  MCH 39.5* 32.8 33.0 32.4 34.1*  MCHC 36.9* 31.9 31.7 32.1 33.2  RDW  18.6* 15.1 15.4 15.2 14.8  LYMPHSABS 0.8 1.1 1.4 1.0 1.0  MONOABS 0.7 0.8 0.6 0.4 1.5*  EOSABS 0.0 0.1 0.1 0.0 0.0  BASOSABS 0.0 0.0 0.0 0.0 0.1   ------------------------------------------------------------------------------------------------------------------  Chemistries  Recent Labs  Lab 02/04/21 0912 02/05/21 0340 02/06/21 0312 02/07/21 0402 02/09/21 0743  NA 135 137 136 135 131*  K 3.6 3.6 3.4* 4.0 3.6  CL 101 104 104 105 101  CO2 _0 GLUCOSE 138* 87 90 131* 94  BUN _1 CREATININE 0.82 0.64 0.72 0.68 0.72  CALCIUM 8.8* 8.8* 8.9 9.1 8.8*  MG  --  1.9 1.9 1.9  --   AST 273* 218* 188* 111* 53*  ALT 192* 174* 165* 128* 83*  ALKPHOS 740* 633* 644* 596* 470*  BILITOT 7.4* 5.8* 4.6* 2.8* 3.3*   ------------------------------------------------------------------------------------------------------------------ estimated creatinine clearance is 68.7 mL/min (by C-G formula based on SCr of 0.72 mg/dL). ------------------------------------------------------------------------------------------------------------------ No results for input(s): TSH, T4TOTAL, T3FREE, THYROIDAB in the last 72 hours.  Invalid input(s): FREET3   Coagulation profile Recent Labs  Lab 02/04/21 0912  INR 1.0   ------------------------------------------------------------------------------------------------------------------- No results for input(s): DDIMER in the last 72 hours. -------------------------------------------------------------------------------------------------------------------  Cardiac Enzymes No results for input(s): CKMB, TROPONINI, MYOGLOBIN in the last 168 hours.  Invalid input(s): CK ------------------------------------------------------------------------------------------------------------------ No results found for: BNP   ---------------------------------------------------------------------------------------------------------------  Urinalysis     Component Value Date/Time   COLORURINE AMBER (A) 02/09/2021 0821   APPEARANCEUR HAZY (A) 02/09/2021 0821   LABSPEC 1.021 02/09/2021 0821   PHURINE 5.0 02/09/2021 0821   GLUCOSEU NEGATIVE 02/09/2021 0821   HGBUR MODERATE (A) 02/09/2021 0821   BILIRUBINUR NEGATIVE 02/09/2021 0821   KETONESUR NEGATIVE 02/09/2021 0821   PROTEINUR 30 (A) 02/09/2021 0821   NITRITE NEGATIVE 02/09/2021 0821   LEUKOCYTESUR NEGATIVE 02/09/2021 0821    ----------------------------------------------------------------------------------------------------------------   Imaging Results:    CT ABDOMEN PELVIS W CONTRAST  Result Date: 02/09/2021 CLINICAL DATA:  Nausea and vomiting. Rising bilirubin. Known choledocholithiasis. EXAM: CT ABDOMEN AND PELVIS WITH CONTRAST TECHNIQUE: Multidetector CT imaging of the abdomen and pelvis was performed using the standard protocol following bolus administration of intravenous contrast. CONTRAST:  33m OMNIPAQUE IOHEXOL 350 MG/ML SOLN COMPARISON:  MRI February 04, 2021.  Ultrasound December 31, 2020. FINDINGS: Lower chest:  There is a small hiatal hernia. No other abnormalities in the lower lungs or chest. Hepatobiliary: The gallbladder is relatively contracted with mucosal enhancement towards the fundus. There is a focus of air in the gallbladder. A biliary stent is seen in the common bile duct. Pneumobilia is identified consistent with the stent and recent ERCP. Mild intrahepatic ductal dilatation remains. No choledocholithiasis identified on this study. A few scattered low-attenuation lesions in the liver too small to characterize but likely tiny cysts. The largest cyst is seen in the right hepatic lobe on axial image 27. No suspicious liver masses. Pancreas: There is fat stranding surrounding the length of the pancreas without fluid collection or pancreatic necrosis identified. Spleen: Normal in size without focal abnormality. Adrenals/Urinary Tract: Adrenal glands are normal. Bilateral renal  cysts are identified. Parapelvic cysts are associated with both kidneys. No hydronephrosis. No ureteral stones are noted. The bladder is unremarkable. Stomach/Bowel: Other than the hiatal hernia, the stomach is normal. The small bowel is normal. The colon and appendix are unremarkable. Vascular/Lymphatic: Calcified atherosclerosis is seen in the nonaneurysmal abdominal aorta, extending into the iliac and femoral vessels. A few reactive nodes are seen in the porta hepatis. No other abnormal nodes identified. Reproductive: Prostate is unremarkable. Other: No abdominal wall hernia or abnormality. No abdominopelvic ascites. Musculoskeletal: No acute or significant osseous findings. IMPRESSION: 1. Diffuse fat stranding around the pancreas most consistent with pancreatitis. Recommend correlation with lipase. 2. A common bile duct stent is identified without identified choledocholithiasis. Mild intrahepatic ductal dilatation remains. Air in the gallbladder and pneumobilia are consistent with recent ERCP and the common bile duct stent. 3. Mild enhancement in the gallbladder mucosa towards the fundus suggests hyperemia and probably some inflammation. 4. Small hiatal hernia. 5. Calcified atherosclerosis in the abdominal aorta, iliac vessels, and femoral vessels. Electronically Signed   By: Dorise Bullion III M.D.   On: 02/09/2021 12:04    Radiological Exams on Admission: CT ABDOMEN PELVIS W CONTRAST  Result Date: 02/09/2021 CLINICAL DATA:  Nausea and vomiting. Rising bilirubin. Known choledocholithiasis. EXAM: CT ABDOMEN AND PELVIS WITH CONTRAST TECHNIQUE: Multidetector CT imaging of the abdomen and pelvis was performed using the standard protocol following bolus administration of intravenous contrast. CONTRAST:  49m OMNIPAQUE IOHEXOL 350 MG/ML SOLN COMPARISON:  MRI February 04, 2021.  Ultrasound December 31, 2020. FINDINGS: Lower chest: There is a small hiatal hernia. No other abnormalities in the lower lungs or chest.  Hepatobiliary: The gallbladder is relatively contracted with mucosal enhancement towards the fundus. There is a focus of air in the gallbladder. A biliary stent is seen in the common bile duct. Pneumobilia is identified consistent with the stent and recent ERCP. Mild intrahepatic ductal dilatation remains. No choledocholithiasis identified on this study. A few scattered low-attenuation lesions in the liver too small to characterize but likely tiny cysts. The largest cyst is seen in the right hepatic lobe on axial image 27. No suspicious liver masses. Pancreas: There is fat stranding surrounding the length of the pancreas without fluid collection or pancreatic necrosis identified. Spleen: Normal in size without focal abnormality. Adrenals/Urinary Tract: Adrenal glands are normal. Bilateral renal cysts are identified. Parapelvic cysts are associated with both kidneys. No hydronephrosis. No ureteral stones are noted. The bladder is unremarkable. Stomach/Bowel: Other than the hiatal hernia, the stomach is normal. The small bowel is normal. The colon and appendix are unremarkable. Vascular/Lymphatic: Calcified atherosclerosis is seen in the nonaneurysmal abdominal aorta, extending into the iliac and femoral vessels. A few reactive nodes  are seen in the porta hepatis. No other abnormal nodes identified. Reproductive: Prostate is unremarkable. Other: No abdominal wall hernia or abnormality. No abdominopelvic ascites. Musculoskeletal: No acute or significant osseous findings. IMPRESSION: 1. Diffuse fat stranding around the pancreas most consistent with pancreatitis. Recommend correlation with lipase. 2. A common bile duct stent is identified without identified choledocholithiasis. Mild intrahepatic ductal dilatation remains. Air in the gallbladder and pneumobilia are consistent with recent ERCP and the common bile duct stent. 3. Mild enhancement in the gallbladder mucosa towards the fundus suggests hyperemia and probably  some inflammation. 4. Small hiatal hernia. 5. Calcified atherosclerosis in the abdominal aorta, iliac vessels, and femoral vessels. Electronically Signed   By: Dorise Bullion III M.D.   On: 02/09/2021 12:04    DVT Prophylaxis -SCD/Heparin AM Labs Ordered, also please review Full Orders  Family Communication: Admission, patients condition and plan of care including tests being ordered have been discussed with the patient and daughter-daughter at bedside Ms Christian Mate who indicate understanding and agree with the plan   Code Status - Full Code  Likely DC to  home  Condition   -stable  Roxan Hockey M.D on 02/09/2021 at 5:30 PM Go to [email or web address] -  for contact info  [facility] - Office  [phone]

## 2021-02-09 NOTE — Progress Notes (Signed)
[facility] following sepsis 

## 2021-02-09 NOTE — Progress Notes (Signed)
Pharmacy Antibiotic Note  Dean Randolph is a 80 y.o. male admitted on 02/09/2021 with sepsis.  Pharmacy has been consulted for Cefepime dosing.  Pt had recent gall bladder surgery  Plan: Cefepime 2gm IV q8hrs  Height: 5\' 6"  (167.6 cm) Weight: 69.8 kg (153 lb 14.1 oz) IBW/kg (Calculated) : 63.8  Temp (24hrs), Avg:98 F (36.7 C), Min:97.7 F (36.5 C), Max:98.2 F (36.8 C)  Recent Labs  Lab 02/04/21 0912 02/05/21 0340 02/06/21 0312 02/07/21 0402 02/09/21 0743  WBC 9.7 8.0 7.6 9.4 18.8*  CREATININE 0.82 0.64 0.72 0.68 0.72  LATICACIDVEN  --   --   --   --  1.0    Estimated Creatinine Clearance: 68.7 mL/min (by C-G formula based on SCr of 0.72 mg/dL).    No Known Allergies  Antimicrobials this admission: Cefepime 8/14 >>    >>   Dose adjustments this admission:   Microbiology results:  BCx: pending  UCx: pending   Sputum: pending   MRSA PCR: pending  Thank you for allowing pharmacy to be a part of this patient's care.  9/14 A 02/09/2021 9:27 AM

## 2021-02-09 NOTE — ED Notes (Signed)
Dr C. Emokpae at bedside.   

## 2021-02-10 DIAGNOSIS — K851 Biliary acute pancreatitis without necrosis or infection: Secondary | ICD-10-CM | POA: Diagnosis not present

## 2021-02-10 DIAGNOSIS — K8309 Other cholangitis: Secondary | ICD-10-CM | POA: Diagnosis not present

## 2021-02-10 LAB — COMPREHENSIVE METABOLIC PANEL
ALT: 57 U/L — ABNORMAL HIGH (ref 0–44)
AST: 52 U/L — ABNORMAL HIGH (ref 15–41)
Albumin: 2.2 g/dL — ABNORMAL LOW (ref 3.5–5.0)
Alkaline Phosphatase: 396 U/L — ABNORMAL HIGH (ref 38–126)
Anion gap: 9 (ref 5–15)
BUN: 16 mg/dL (ref 8–23)
CO2: 24 mmol/L (ref 22–32)
Calcium: 8.6 mg/dL — ABNORMAL LOW (ref 8.9–10.3)
Chloride: 104 mmol/L (ref 98–111)
Creatinine, Ser: 0.68 mg/dL (ref 0.61–1.24)
GFR, Estimated: >60 mL/min (ref >60)
Glucose, Bld: 109 mg/dL — ABNORMAL HIGH (ref 70–99)
Potassium: 4.2 mmol/L (ref 3.5–5.1)
Sodium: 137 mmol/L (ref 135–145)
Total Bilirubin: 2.5 mg/dL — ABNORMAL HIGH (ref 0.3–1.2)
Total Protein: 6.5 g/dL (ref 6.5–8.1)

## 2021-02-10 LAB — GLUCOSE, CAPILLARY
Glucose-Capillary: 110 mg/dL — ABNORMAL HIGH (ref 70–99)
Glucose-Capillary: 112 mg/dL — ABNORMAL HIGH (ref 70–99)
Glucose-Capillary: 115 mg/dL — ABNORMAL HIGH (ref 70–99)
Glucose-Capillary: 119 mg/dL — ABNORMAL HIGH (ref 70–99)
Glucose-Capillary: 135 mg/dL — ABNORMAL HIGH (ref 70–99)

## 2021-02-10 LAB — CBC
HCT: 32.8 % — ABNORMAL LOW (ref 39.0–52.0)
Hemoglobin: 10.8 g/dL — ABNORMAL LOW (ref 13.0–17.0)
MCH: 34.2 pg — ABNORMAL HIGH (ref 26.0–34.0)
MCHC: 32.9 g/dL (ref 30.0–36.0)
MCV: 103.8 fL — ABNORMAL HIGH (ref 80.0–100.0)
Platelets: 202 10*3/uL (ref 150–400)
RBC: 3.16 MIL/uL — ABNORMAL LOW (ref 4.22–5.81)
RDW: 15 % (ref 11.5–15.5)
WBC: 12.8 10*3/uL — ABNORMAL HIGH (ref 4.0–10.5)
nRBC: 0 % (ref 0.0–0.2)

## 2021-02-10 LAB — LIPASE, BLOOD: Lipase: 43 U/L (ref 11–51)

## 2021-02-10 LAB — PROTIME-INR
INR: 1.2 (ref 0.8–1.2)
Prothrombin Time: 14.7 seconds (ref 11.4–15.2)

## 2021-02-10 LAB — SARS CORONAVIRUS 2 (TAT 6-24 HRS): SARS Coronavirus 2: NEGATIVE

## 2021-02-10 NOTE — Progress Notes (Signed)
Subjective: No pain, N/V. Feels tired and "crummy". Eager to try clear liquids. Family member at bedside.   Objective: Vital signs in last 24 hours: Temp:  [98 F (36.7 C)-99.1 F (37.3 C)] 98.3 F (36.8 C) (08/15 0536) Pulse Rate:  [73-85] 73 (08/15 0536) Resp:  [16-27] 16 (08/15 0536) BP: (125-146)/(60-77) 125/60 (08/15 0915) SpO2:  [97 %-100 %] 98 % (08/15 0536) Weight:  [68.2 kg] 68.2 kg (08/14 2030) Last BM Date: 02/08/21 General:   Alert and oriented, pleasant, chronically ill-appearing Abdomen:  Bowel sounds present, soft, non-tender, non-distended. No HSM or hernias noted. No rebound or guarding. No masses appreciated  Neurologic:  Alert and  oriented x4 Psych:  Alert and cooperative. Normal mood and affect.  Intake/Output from previous day: 08/14 0701 - 08/15 0700 In: 3537.4 [I.V.:1937.4; IV Piggyback:1600] Out: 1100 [Urine:1100] Intake/Output this shift: No intake/output data recorded.  Lab Results: Recent Labs    02/09/21 0743 02/10/21 0711  WBC 18.8* 12.8*  HGB 12.4* 10.8*  HCT 37.3* 32.8*  PLT 199 202   BMET Recent Labs    02/09/21 0743 02/10/21 0711  NA 131* 137  K 3.6 4.2  CL 101 104  CO2 24 24  GLUCOSE 94 109*  BUN 21 16  CREATININE 0.72 0.68  CALCIUM 8.8* 8.6*   LFT Recent Labs    02/09/21 0743 02/10/21 0711  PROT 7.5 6.5  ALBUMIN 2.6* 2.2*  AST 53* 52*  ALT 83* 57*  ALKPHOS 470* 396*  BILITOT 3.3* 2.5*   PT/INR Recent Labs    02/10/21 0711  LABPROT 14.7  INR 1.2     Studies/Results: CT ABDOMEN PELVIS W CONTRAST  Result Date: 02/09/2021 CLINICAL DATA:  Nausea and vomiting. Rising bilirubin. Known choledocholithiasis. EXAM: CT ABDOMEN AND PELVIS WITH CONTRAST TECHNIQUE: Multidetector CT imaging of the abdomen and pelvis was performed using the standard protocol following bolus administration of intravenous contrast. CONTRAST:  45mL OMNIPAQUE IOHEXOL 350 MG/ML SOLN COMPARISON:  MRI February 04, 2021.  Ultrasound December 31, 2020. FINDINGS: Lower chest: There is a small hiatal hernia. No other abnormalities in the lower lungs or chest. Hepatobiliary: The gallbladder is relatively contracted with mucosal enhancement towards the fundus. There is a focus of air in the gallbladder. A biliary stent is seen in the common bile duct. Pneumobilia is identified consistent with the stent and recent ERCP. Mild intrahepatic ductal dilatation remains. No choledocholithiasis identified on this study. A few scattered low-attenuation lesions in the liver too small to characterize but likely tiny cysts. The largest cyst is seen in the right hepatic lobe on axial image 27. No suspicious liver masses. Pancreas: There is fat stranding surrounding the length of the pancreas without fluid collection or pancreatic necrosis identified. Spleen: Normal in size without focal abnormality. Adrenals/Urinary Tract: Adrenal glands are normal. Bilateral renal cysts are identified. Parapelvic cysts are associated with both kidneys. No hydronephrosis. No ureteral stones are noted. The bladder is unremarkable. Stomach/Bowel: Other than the hiatal hernia, the stomach is normal. The small bowel is normal. The colon and appendix are unremarkable. Vascular/Lymphatic: Calcified atherosclerosis is seen in the nonaneurysmal abdominal aorta, extending into the iliac and femoral vessels. A few reactive nodes are seen in the porta hepatis. No other abnormal nodes identified. Reproductive: Prostate is unremarkable. Other: No abdominal wall hernia or abnormality. No abdominopelvic ascites. Musculoskeletal: No acute or significant osseous findings. IMPRESSION: 1. Diffuse fat stranding around the pancreas most consistent with pancreatitis. Recommend correlation with lipase. 2. A common  bile duct stent is identified without identified choledocholithiasis. Mild intrahepatic ductal dilatation remains. Air in the gallbladder and pneumobilia are consistent with recent ERCP and the common  bile duct stent. 3. Mild enhancement in the gallbladder mucosa towards the fundus suggests hyperemia and probably some inflammation. 4. Small hiatal hernia. 5. Calcified atherosclerosis in the abdominal aorta, iliac vessels, and femoral vessels. Electronically Signed   By: Gerome Sam III M.D.   On: 02/09/2021 12:04    Assessment: 80 year old male with history of choledocholithiasis last week undergoing ERCP with sphincterotomy and plastic stent placement by Dr. Karilyn Cota on 8/11; at that time, stones were immobile and stacked. Presenting yesterday with acute abdominal pain, sweats, chills, and found to have leukocytosis with WBC count 18.8, elevated LFTs although improved from recent discharge, and lipase mildly elevated at 63 but with CT findings of pancreatitis. CBD stent in place without identified choledocholithiasis. Admitted with acute pancreatitis likely secondary to gallstones and unable to exclude post-ERCP pancreatitis.   Clinically, he has improved with resolution of abdominal pain. Desires to start clear liquid diet. Leukocytosis improved from 18.8 to 12.8 today. Agree with empiric antibiotics. May want to consider surgical consult while here; plans had been for outpatient cholecystectomy initially when inpatient last week.   Will start on clear liquid diet today.     Plan: Clear liquids Follow HFP, lipase Consider Surgical consult while inpatient Continue empiric antibiotics Outpatient ERCP for stent removal in 4-6 weeks   Gelene Mink, PhD, ANP-BC [facility]    LOS: 1 day    02/10/2021, 10:38 AM

## 2021-02-10 NOTE — Progress Notes (Signed)
Patient Demographics:    Dean Randolph, is a 80 y.o. male, DOB - 24-Jul-1940, OAC:[id number]  Admit date - 02/09/2021   Admitting Physician Demont Linford Denton Brick, MD  Outpatient Primary MD for the patient is Claretta Fraise, MD  LOS - 1   Chief Complaint  Patient presents with   Abdominal Pain        Subjective:    Dean Randolph today has no further fevers, no emesis,  No chest pain,   -No further chills, no further night sweats -Requesting to be fed   Assessment  & Plan :    Principal Problem:   Pancreatitis due to biliary obstruction/S/p ERCP with stent Placement on 02/06/21 Active Problems:   Calculus of bile duct with cholangitis and obstruction   Acute cholangitis   Choledocholithiasis/Cholelithiasis--S/p ERCP with Plastic Stent Placement on 02/06/21   Transaminitis   Hyperbilirubinemia   Iron deficiency anemia   Essential hypertension  Brief Summary:- 80 y.o. male with past medical history relevant for HTN who was recently admitted on 02/04/2021 through 02/07/2021 for choledocholithiasis and cholelithiasis--underwent  ERCP  on 02/06/21--common bile duct and common hepatic duct were moderately dilated containing multiple stones. Stones appeared to be all stacked up and immobile;  sphincterotomy performed and plastic stent placed in CBD readmitted on 02/10/2020 with concerns for acute pancreatitis and possible ascending cholangitis in view of recent ERCP  A/p 1)Acute Pancreatitis due to Biliary Obstruction--- cannot rule out possible post ERCP pancreatitis -But patient still has choledocholithiasis as stones were not retrieved during recent ERCP on 02/06/21 -Lipase is up to 63 it was 24 on 02/04/2021 -Lipase down to 43 -CT abdomen and pelvis on admission consistent with acute pancreatitis - GI consult appreciated recommends clear liquid diet -IV fluids -IV antiemetics and pain medications    2)Choledocholithiasis/Cholelithiasis--S/p ERCP with Plastic Stent Placement on 02/06/21----clinical presentation this admission is suspicious for possible ascending cholangitis--patient was admitted with chills, night sweats, abdominal pain and nausea as well as new leukocytosis with WBC of 18.8, WBC was 9.4 on 02/07/2021 -WBC down to 12.8 from 18.8 on admission -Continue empiric IV cefepime and Flagyl pending culture data   3)Transaminitis--- secondary to biliary obstruction as above --LFTs are actually trending down compared to recent values Hepatic Function Latest Ref Rng & Units 02/10/2021 02/09/2021 02/07/2021  Total Protein 6.5 - 8.1 g/dL 6.5 7.5 7.0  Albumin 3.5 - 5.0 g/dL 2.2(L) 2.6(L) 2.5(L)  AST 15 - 41 U/L 52(H) 53(H) 111(H)  ALT 0 - 44 U/L 57(H) 83(H) 128(H)  Alk Phosphatase 38 - 126 U/L 396(H) 470(H) 596(H)  Total Bilirubin 0.3 - 1.2 mg/dL 2.5(H) 3.3(H) 2.8(H)  Bilirubin, Direct 0.00 - 0.40 mg/dL - - -     4)Sepsis secondary to presumed ascending cholangitis-- POA -Lactic acid not elevated -Patient met sepsis criteria on admission due to cholangitis with tachycardia, tachypnea and leukocytosis -Continue IV fluids -Cefepime and Flagyl as above pending further culture data   5) iron deficiency anemia anemia--- hemoglobin is 12.4, no ongoing bleeding concerns, anticipate drop in Hgb with IV fluids   6) hyponatremia--sodium is 131, suspect due to dehydration in setting of poor oral intake and persistent nausea IV fluids as ordered -Sodium has normalized with IV fluids   7)HTN--- amlodipine 5 mg  as prescribed   Disposition/Need for in-Hospital Stay- patient unable to be discharged at this time due to -acute pancreatitis and possible ascending cholangitis requiring IV antibiotics and IV fluids   Status is: Inpatient   Remains inpatient appropriate because: Please see disposition above   Dispo: The patient is from: Home              Anticipated d/c is to: Home               Anticipated d/c date is: 2 days              Patient currently is not medically stable to d/c. Barriers: Not Clinically Stable-   Code Status :  -  Code Status: Full Code   Family Communication:    (patient is alert, awake and coherent)  Discussed with Daughter at bedside Consults  :  Gi   DVT Prophylaxis  :   - SCDs   heparin injection 5,000 Units Start: 02/09/21 1730 SCDs Start: 02/09/21 1721 Place TED hose Start: 02/09/21 1721    Lab Results  Component Value Date   PLT 202 02/10/2021    Inpatient Medications  Scheduled Meds:  amLODipine  5 mg Oral Daily   heparin  5,000 Units Subcutaneous Q8H   sodium chloride flush  3 mL Intravenous Q12H   sodium chloride flush  3 mL Intravenous Q12H   Continuous Infusions:  sodium chloride     ceFEPime (MAXIPIME) IV 2 g (02/10/21 1749)   dextrose 5 % and 0.9% NaCl 100 mL/hr at 02/10/21 1730   metronidazole 500 mg (02/10/21 1832)   PRN Meds:.sodium chloride, acetaminophen **OR** acetaminophen, bisacodyl, fentaNYL (SUBLIMAZE) injection, ondansetron **OR** ondansetron (ZOFRAN) IV, oxyCODONE, polyethylene glycol, sodium chloride flush, traZODone    Anti-infectives (From admission, onward)    Start     Dose/Rate Route Frequency Ordered Stop   02/09/21 1800  ceFEPIme (MAXIPIME) 2 g in sodium chloride 0.9 % 100 mL IVPB        2 g 200 mL/hr over 30 Minutes Intravenous Every 8 hours 02/09/21 0926     02/09/21 1800  metroNIDAZOLE (FLAGYL) IVPB 500 mg        500 mg 100 mL/hr over 60 Minutes Intravenous Every 8 hours 02/09/21 1215     02/09/21 0915  ceFEPIme (MAXIPIME) 2 g in sodium chloride 0.9 % 100 mL IVPB        2 g 200 mL/hr over 30 Minutes Intravenous  Once 02/09/21 0913 02/09/21 1032   02/09/21 0915  metroNIDAZOLE (FLAGYL) IVPB 500 mg        500 mg 100 mL/hr over 60 Minutes Intravenous  Once 02/09/21 0913 02/09/21 1139         Objective:   Vitals:   02/10/21 0433 02/10/21 0536 02/10/21 0915 02/10/21 1427  BP: 126/68  135/67 125/60 (!) 130/59  Pulse: 76 73  73  Resp: _0 Temp: 98.1 F (36.7 C) 98.3 F (36.8 C)  98.4 F (36.9 C)  TempSrc: Oral Oral  Oral  SpO2: 99% 98%  100%  Weight:      Height:        Wt Readings from Last 3 Encounters:  02/09/21 68.2 kg  02/07/21 69.8 kg  12/02/20 70.7 kg     Intake/Output Summary (Last 24 hours) at 02/10/2021 1937 Last data filed at 02/10/2021 1700 Gross per 24 hour  Intake 3076.6 ml  Output 550 ml  Net 2526.6 ml  Physical Exam  Gen:- Awake Alert,  in no apparent distress  HEENT:- Tangent.AT,  +ve sclera icterus Neck-Supple Neck,No JVD,.  Lungs-  CTAB , fair symmetrical air movement CV- S1, S2 normal, regular  Abd-  +ve B.Sounds, Abd Soft, No tenderness,    Extremity/Skin:- No  edema, pedal pulses present  Psych-affect is appropriate, oriented x3 Neuro-no new focal deficits, no tremors   Data Review:   Micro Results Recent Results (from the past 240 hour(s))  Resp Panel by RT-PCR (Flu A&B, Covid) Nasopharyngeal Swab     Status: None   Collection Time: 02/04/21  1:10 PM   Specimen: Nasopharyngeal Swab; Nasopharyngeal(NP) swabs in vial transport medium  Result Value Ref Range Status   SARS Coronavirus 2 by RT PCR NEGATIVE NEGATIVE Final    Comment: (NOTE) SARS-CoV-2 target nucleic acids are NOT DETECTED.  The SARS-CoV-2 RNA is generally detectable in upper respiratory specimens during the acute phase of infection. The lowest concentration of SARS-CoV-2 viral copies this assay can detect is 138 copies/mL. A negative result does not preclude SARS-Cov-2 infection and should not be used as the sole basis for treatment or other patient management decisions. A negative result may occur with  improper specimen collection/handling, submission of specimen other than nasopharyngeal swab, presence of viral mutation(s) within the areas targeted by this assay, and inadequate number of viral copies(<138 copies/mL). A negative result must be  combined with clinical observations, patient history, and epidemiological information. The expected result is Negative.  Fact Sheet for Patients:  EntrepreneurPulse.com.au  Fact Sheet for Healthcare Providers:  IncredibleEmployment.be  This test is no t yet approved or cleared by the [place] FDA and  has been authorized for detection and/or diagnosis of SARS-CoV-2 by FDA under an Emergency Use Authorization (EUA). This EUA will remain  in effect (meaning this test can be used) for the duration of the COVID-19 declaration under Section 564(b)(1) of the Act, 21 U.S.C.section 360bbb-3(b)(1), unless the authorization is terminated  or revoked sooner.       Influenza A by PCR NEGATIVE NEGATIVE Final   Influenza B by PCR NEGATIVE NEGATIVE Final    Comment: (NOTE) The Xpert Xpress SARS-CoV-2/FLU/RSV plus assay is intended as an aid in the diagnosis of influenza from Nasopharyngeal swab specimens and should not be used as a sole basis for treatment. Nasal washings and aspirates are unacceptable for Xpert Xpress SARS-CoV-2/FLU/RSV testing.  Fact Sheet for Patients: EntrepreneurPulse.com.au  Fact Sheet for Healthcare Providers: IncredibleEmployment.be  This test is not yet approved or cleared by the [place] FDA and has been authorized for detection and/or diagnosis of SARS-CoV-2 by FDA under an Emergency Use Authorization (EUA). This EUA will remain in effect (meaning this test can be used) for the duration of the COVID-19 declaration under Section 564(b)(1) of the Act, 21 U.S.C. section 360bbb-3(b)(1), unless the authorization is terminated or revoked.  Performed at [facility], [address] [place], [place] [address]   Blood culture (routine x 2)     Status: None (Preliminary result)   Collection Time: 02/09/21  9:29 AM   Specimen: BLOOD  Result Value Ref Range Status   Specimen Description  BLOOD  Final   Special Requests NONE  Final   Culture   Final    NO GROWTH 1 DAY Performed at [facility], [address], [place], [place] [address]    Report Status PENDING  Incomplete  Blood culture (routine x 2)     Status: None (Preliminary result)  Collection Time: 02/09/21  9:45 AM   Specimen: BLOOD  Result Value Ref Range Status   Specimen Description BLOOD  Final   Special Requests NONE  Final   Culture   Final    NO GROWTH 1 DAY Performed at [facility] [address] [place], [place] [address]    Report Status PENDING  Incomplete  SARS CORONAVIRUS 2 (TAT 6-24 HRS) Nasopharyngeal Nasopharyngeal Swab     Status: None   Collection Time: 02/09/21  5:43 PM   Specimen: Nasopharyngeal Swab  Result Value Ref Range Status   SARS Coronavirus 2 NEGATIVE NEGATIVE Final    Comment: (NOTE) SARS-CoV-2 target nucleic acids are NOT DETECTED.  The SARS-CoV-2 RNA is generally detectable in upper and lower respiratory specimens during the acute phase of infection. Negative results do not preclude SARS-CoV-2 infection, do not rule out co-infections with other pathogens, and should not be used as the sole basis for treatment or other patient management decisions. Negative results must be combined with clinical observations, patient history, and epidemiological information. The expected result is Negative.  Fact Sheet for Patients: SugarRoll.be  Fact Sheet for Healthcare Providers: https://www.woods-mathews.com/  This test is not yet approved or cleared by the [place] FDA and  has been authorized for detection and/or diagnosis of SARS-CoV-2 by FDA under an Emergency Use Authorization (EUA). This EUA will remain  in effect (meaning this test can be used) for the duration of the COVID-19 declaration under Se ction 564(b)(1) of the Act, 21 U.S.C. section 360bbb-3(b)(1), unless the authorization is terminated or revoked  sooner.  Performed at [facility], [place] [address], [place], [place] [address]     Radiology Reports CT ABDOMEN PELVIS W CONTRAST  Result Date: 02/09/2021 CLINICAL DATA:  Nausea and vomiting. Rising bilirubin. Known choledocholithiasis. EXAM: CT ABDOMEN AND PELVIS WITH CONTRAST TECHNIQUE: Multidetector CT imaging of the abdomen and pelvis was performed using the standard protocol following bolus administration of intravenous contrast. CONTRAST:  4m OMNIPAQUE IOHEXOL 350 MG/ML SOLN COMPARISON:  MRI February 04, 2021.  Ultrasound December 31, 2020. FINDINGS: Lower chest: There is a small hiatal hernia. No other abnormalities in the lower lungs or chest. Hepatobiliary: The gallbladder is relatively contracted with mucosal enhancement towards the fundus. There is a focus of air in the gallbladder. A biliary stent is seen in the common bile duct. Pneumobilia is identified consistent with the stent and recent ERCP. Mild intrahepatic ductal dilatation remains. No choledocholithiasis identified on this study. A few scattered low-attenuation lesions in the liver too small to characterize but likely tiny cysts. The largest cyst is seen in the right hepatic lobe on axial image 27. No suspicious liver masses. Pancreas: There is fat stranding surrounding the length of the pancreas without fluid collection or pancreatic necrosis identified. Spleen: Normal in size without focal abnormality. Adrenals/Urinary Tract: Adrenal glands are normal. Bilateral renal cysts are identified. Parapelvic cysts are associated with both kidneys. No hydronephrosis. No ureteral stones are noted. The bladder is unremarkable. Stomach/Bowel: Other than the hiatal hernia, the stomach is normal. The small bowel is normal. The colon and appendix are unremarkable. Vascular/Lymphatic: Calcified atherosclerosis is seen in the nonaneurysmal abdominal aorta, extending into the iliac and femoral vessels. A few reactive nodes are seen in the porta  hepatis. No other abnormal nodes identified. Reproductive: Prostate is unremarkable. Other: No abdominal wall hernia or abnormality. No abdominopelvic ascites. Musculoskeletal: No acute or significant osseous findings. IMPRESSION: 1. Diffuse fat stranding around the pancreas most consistent  with pancreatitis. Recommend correlation with lipase. 2. A common bile duct stent is identified without identified choledocholithiasis. Mild intrahepatic ductal dilatation remains. Air in the gallbladder and pneumobilia are consistent with recent ERCP and the common bile duct stent. 3. Mild enhancement in the gallbladder mucosa towards the fundus suggests hyperemia and probably some inflammation. 4. Small hiatal hernia. 5. Calcified atherosclerosis in the abdominal aorta, iliac vessels, and femoral vessels. Electronically Signed   By: Dorise Bullion III M.D.   On: 02/09/2021 12:04   MR 3D Recon At Scanner  Result Date: 02/04/2021 CLINICAL DATA:  Jaundice, upper abdominal pain, abnormal ultrasound, malignancy suspected EXAM: MRI ABDOMEN WITHOUT AND WITH CONTRAST (INCLUDING MRCP) TECHNIQUE: Multiplanar multisequence MR imaging of the abdomen was performed both before and after the administration of intravenous contrast. Heavily T2-weighted images of the biliary and pancreatic ducts were obtained, and three-dimensional MRCP images were rendered by post processing. CONTRAST:  49m GADAVIST GADOBUTROL 1 MMOL/ML IV SOLN COMPARISON:  Right upper quadrant ultrasound, 12/31/2020 FINDINGS: Examination is generally somewhat limited by breath motion artifact, particularly on contrast enhanced sequences. Lower chest: No acute findings. Hepatobiliary: No mass or other parenchymal abnormality identified. Relatively contracted gallbladder containing multiple gallstones. There is gross intra and extrahepatic biliary ductal dilatation, the common bile duct measuring up to 2.1 cm centrally (series 3, image 16) with numerous gallstones (>10) of  varying sizes present in the pancreatic portion of the common bile duct, the largest near the ampulla measuring at least 1.3 cm (series 3, image 16). Pancreas: No mass, inflammatory changes, or other parenchymal abnormality identified. No pancreatic ductal dilatation. Spleen:  Within normal limits in size and appearance. Adrenals/Urinary Tract: No masses identified. Simple, benign bilateral renal cortical and parapelvic cysts. No evidence of hydronephrosis. Stomach/Bowel: Visualized portions within the abdomen are unremarkable. Vascular/Lymphatic: No pathologically enlarged lymph nodes identified. No abdominal aortic aneurysm demonstrated. Other:  None. Musculoskeletal: No suspicious bone lesions identified. IMPRESSION: 1. There is gross intra and extrahepatic biliary ductal dilatation, the common bile duct measuring up to 2.1 cm centrally with numerous gallstones (>10) present in the pancreatic portion of the common bile duct, the largest near the ampulla measuring at least 1.3 cm. Findings are consistent with choledocholithiasis. 2. Relatively contracted gallbladder containing multiple additional gallstones. Electronically Signed   By: AEddie CandleM.D.   On: 02/04/2021 11:35   DG ERCP  Result Date: 02/06/2021 CLINICAL DATA:  Choledocholithiasis. EXAM: ERCP TECHNIQUE: Multiple spot images obtained with the fluoroscopic device and submitted for interpretation post-procedure. COMPARISON:  MRI/MRCP on 02/04/2021 FINDINGS: Imaging with a C-arm during the procedure demonstrates cannulation of the common bile duct with contrast injection demonstrating a diffusely dilated common bile duct packed with a multitude calculi. Unclear by submitted imaging whether stone extraction was performed. A biliary stent was placed extending from the confluence of right and left intrahepatic ducts into the duodenum. IMPRESSION: Diffusely dilated common bile duct packed with multiple calculi. During the procedure a biliary stent was  placed. These images were submitted for radiologic interpretation only. Please see the procedural report for the amount of contrast and the fluoroscopy time utilized. Electronically Signed   By: GAletta EdouardM.D.   On: 02/06/2021 11:14   MR ABDOMEN MRCP W WO CONTAST  Result Date: 02/04/2021 CLINICAL DATA:  Jaundice, upper abdominal pain, abnormal ultrasound, malignancy suspected EXAM: MRI ABDOMEN WITHOUT AND WITH CONTRAST (INCLUDING MRCP) TECHNIQUE: Multiplanar multisequence MR imaging of the abdomen was performed both before and after the administration of intravenous contrast. Heavily  T2-weighted images of the biliary and pancreatic ducts were obtained, and three-dimensional MRCP images were rendered by post processing. CONTRAST:  70m GADAVIST GADOBUTROL 1 MMOL/ML IV SOLN COMPARISON:  Right upper quadrant ultrasound, 12/31/2020 FINDINGS: Examination is generally somewhat limited by breath motion artifact, particularly on contrast enhanced sequences. Lower chest: No acute findings. Hepatobiliary: No mass or other parenchymal abnormality identified. Relatively contracted gallbladder containing multiple gallstones. There is gross intra and extrahepatic biliary ductal dilatation, the common bile duct measuring up to 2.1 cm centrally (series 3, image 16) with numerous gallstones (>10) of varying sizes present in the pancreatic portion of the common bile duct, the largest near the ampulla measuring at least 1.3 cm (series 3, image 16). Pancreas: No mass, inflammatory changes, or other parenchymal abnormality identified. No pancreatic ductal dilatation. Spleen:  Within normal limits in size and appearance. Adrenals/Urinary Tract: No masses identified. Simple, benign bilateral renal cortical and parapelvic cysts. No evidence of hydronephrosis. Stomach/Bowel: Visualized portions within the abdomen are unremarkable. Vascular/Lymphatic: No pathologically enlarged lymph nodes identified. No abdominal aortic aneurysm  demonstrated. Other:  None. Musculoskeletal: No suspicious bone lesions identified. IMPRESSION: 1. There is gross intra and extrahepatic biliary ductal dilatation, the common bile duct measuring up to 2.1 cm centrally with numerous gallstones (>10) present in the pancreatic portion of the common bile duct, the largest near the ampulla measuring at least 1.3 cm. Findings are consistent with choledocholithiasis. 2. Relatively contracted gallbladder containing multiple additional gallstones. Electronically Signed   By: AEddie CandleM.D.   On: 02/04/2021 11:35     CBC Recent Labs  Lab 02/04/21 0912 02/05/21 0340 02/06/21 0312 02/07/21 0402 02/09/21 0743 02/10/21 0711  WBC 9.7 8.0 7.6 9.4 18.8* 12.8*  HGB 12.4* 11.8* 12.0* 12.1* 12.4* 10.8*  HCT 33.6* 37.0* 37.8* 37.7* 37.3* 32.8*  PLT 223 192 214 243 199 202  MCV 107.0* 102.8* 103.8* 101.1* 102.5* 103.8*  MCH 39.5* 32.8 33.0 32.4 34.1* 34.2*  MCHC 36.9* 31.9 31.7 32.1 33.2 32.9  RDW 18.6* 15.1 15.4 15.2 14.8 15.0  LYMPHSABS 0.8 1.1 1.4 1.0 1.0  --   MONOABS 0.7 0.8 0.6 0.4 1.5*  --   EOSABS 0.0 0.1 0.1 0.0 0.0  --   BASOSABS 0.0 0.0 0.0 0.0 0.1  --     Chemistries  Recent Labs  Lab 02/05/21 0340 02/06/21 0312 02/07/21 0402 02/09/21 0743 02/10/21 0711  NA 137 136 135 131* 137  K 3.6 3.4* 4.0 3.6 4.2  CL 104 104 105 101 104  CO2 _0 GLUCOSE 87 90 131* 94 109*  BUN _1 CREATININE 0.64 0.72 0.68 0.72 0.68  CALCIUM 8.8* 8.9 9.1 8.8* 8.6*  MG 1.9 1.9 1.9  --   --   AST 218* 188* 111* 53* 52*  ALT 174* 165* 128* 83* 57*  ALKPHOS 633* 644* 596* 470* 396*  BILITOT 5.8* 4.6* 2.8* 3.3* 2.5*   ------------------------------------------------------------------------------------------------------------------ No results for input(s): CHOL, HDL, LDLCALC, TRIG, CHOLHDL, LDLDIRECT in the last 72 hours.  Lab Results  Component Value Date   HGBA1C 5.3 12/11/2020    ------------------------------------------------------------------------------------------------------------------ No results for input(s): TSH, T4TOTAL, T3FREE, THYROIDAB in the last 72 hours.  Invalid input(s): FREET3 ------------------------------------------------------------------------------------------------------------------ No results for input(s): VITAMINB12, FOLATE, FERRITIN, TIBC, IRON, RETICCTPCT in the last 72 hours.  Coagulation profile Recent Labs  Lab 02/04/21 0912 02/10/21 0711  INR 1.0 1.2    No results for input(s): DDIMER in the last 72 hours.  Cardiac Enzymes No results for input(s): CKMB, TROPONINI, MYOGLOBIN in the last 168 hours.  Invalid input(s): CK ------------------------------------------------------------------------------------------------------------------ No results found for: BNP   Roxan Hockey M.D on 02/10/2021 at 7:37 PM  Go to [email or web address] - for contact info  [facility] - Office  [phone]

## 2021-02-11 LAB — COMPREHENSIVE METABOLIC PANEL
ALT: 51 U/L — ABNORMAL HIGH (ref 0–44)
AST: 49 U/L — ABNORMAL HIGH (ref 15–41)
Albumin: 2.1 g/dL — ABNORMAL LOW (ref 3.5–5.0)
Alkaline Phosphatase: 430 U/L — ABNORMAL HIGH (ref 38–126)
Anion gap: 4 — ABNORMAL LOW (ref 5–15)
BUN: 12 mg/dL (ref 8–23)
CO2: 23 mmol/L (ref 22–32)
Calcium: 8 mg/dL — ABNORMAL LOW (ref 8.9–10.3)
Chloride: 108 mmol/L (ref 98–111)
Creatinine, Ser: 0.7 mg/dL (ref 0.61–1.24)
GFR, Estimated: >60 mL/min (ref >60)
Glucose, Bld: 112 mg/dL — ABNORMAL HIGH (ref 70–99)
Potassium: 3.4 mmol/L — ABNORMAL LOW (ref 3.5–5.1)
Sodium: 135 mmol/L (ref 135–145)
Total Bilirubin: 1.9 mg/dL — ABNORMAL HIGH (ref 0.3–1.2)
Total Protein: 6.1 g/dL — ABNORMAL LOW (ref 6.5–8.1)

## 2021-02-11 LAB — CBC WITH DIFFERENTIAL/PLATELET
Abs Immature Granulocytes: 0.05 10*3/uL (ref 0.00–0.07)
Basophils Absolute: 0.1 10*3/uL (ref 0.0–0.1)
Basophils Relative: 1 %
Eosinophils Absolute: 0.2 10*3/uL (ref 0.0–0.5)
Eosinophils Relative: 2 %
HCT: 32 % — ABNORMAL LOW (ref 39.0–52.0)
Hemoglobin: 10.3 g/dL — ABNORMAL LOW (ref 13.0–17.0)
Immature Granulocytes: 1 %
Lymphocytes Relative: 13 %
Lymphs Abs: 1.1 10*3/uL (ref 0.7–4.0)
MCH: 33.8 pg (ref 26.0–34.0)
MCHC: 32.2 g/dL (ref 30.0–36.0)
MCV: 104.9 fL — ABNORMAL HIGH (ref 80.0–100.0)
Monocytes Absolute: 0.6 10*3/uL (ref 0.1–1.0)
Monocytes Relative: 7 %
Neutro Abs: 6.6 10*3/uL (ref 1.7–7.7)
Neutrophils Relative %: 76 %
Platelets: 201 10*3/uL (ref 150–400)
RBC: 3.05 MIL/uL — ABNORMAL LOW (ref 4.22–5.81)
RDW: 14.6 % (ref 11.5–15.5)
WBC: 8.6 10*3/uL (ref 4.0–10.5)
nRBC: 0 % (ref 0.0–0.2)

## 2021-02-11 LAB — GLUCOSE, CAPILLARY
Glucose-Capillary: 110 mg/dL — ABNORMAL HIGH (ref 70–99)
Glucose-Capillary: 84 mg/dL (ref 70–99)
Glucose-Capillary: 85 mg/dL (ref 70–99)
Glucose-Capillary: 98 mg/dL (ref 70–99)

## 2021-02-11 NOTE — Progress Notes (Signed)
Patient Demographics:    Dean Randolph, is a 80 y.o. male, DOB - 1940-10-29, XLK:[id number]  Admit date - 02/09/2021   Admitting Physician Asheton Scheffler Denton Brick, MD  Outpatient Primary MD for the patient is Claretta Fraise, MD  LOS - 2   Chief Complaint  Patient presents with   Abdominal Pain        Subjective:    Dean Randolph today has no further fevers, no emesis,  No chest pain,   -Daughter at bedside, tolerating clear liquid diet well, -Requesting to have diet advanced   Assessment  & Plan :    Principal Problem:   Pancreatitis due to biliary obstruction/S/p ERCP with stent Placement on 02/06/21 Active Problems:   Cholangitis   Acute cholangitis   Choledocholithiasis/Cholelithiasis--S/p ERCP with Plastic Stent Placement on 02/06/21   Transaminitis   Hyperbilirubinemia   Iron deficiency anemia   Essential hypertension  Brief Summary:- 80 y.o. male with past medical history relevant for HTN who was recently admitted on 02/04/2021 through 02/07/2021 for choledocholithiasis and cholelithiasis--underwent  ERCP  on 02/06/21--common bile duct and common hepatic duct were moderately dilated containing multiple stones. Stones appeared to be all stacked up and immobile;  sphincterotomy performed and plastic stent placed in CBD readmitted on 02/10/2020 with concerns for acute pancreatitis and possible ascending cholangitis in view of recent ERCP  A/p 1)Acute Pancreatitis due to Biliary Obstruction--- cannot rule out possible post ERCP pancreatitis -But patient still has choledocholithiasis as stones were not retrieved during recent ERCP on 02/06/21 -Lipase is up to 63 it was 24 on 02/04/2021 -Lipase down to 43 -CT abdomen and pelvis on admission consistent with acute pancreatitis - GI consult appreciated  -Okay to advance diet to full liquids -Decrease IVF rate -c/n IV antiemetics and pain  medications -Possible discharge home on Wednesday, 02/12/2021 if tolerating oral intake well without recurrence of abdominal symptoms   2)Choledocholithiasis/Cholelithiasis/possible ascending cholangitis--S/p ERCP with Plastic Stent Placement on 02/06/21----clinical presentation this admission is suspicious for possible ascending cholangitis--patient was admitted with chills, night sweats, abdominal pain and nausea as well as new leukocytosis with WBC of 18.8, WBC was 9.4 on 02/07/2021 -WBC 18.8 >>12.8 >>8.6 -Continue empiric IV cefepime and Flagyl pending culture data- -If cultures remain negative on 02/12/2021 may switch to oral antibiotics to complete 7 days of antibiotic therapy   3)Transaminitis--- secondary to biliary obstruction as above --LFTs are actually trending down compared to recent values Hepatic Function Latest Ref Rng & Units 02/11/2021 02/10/2021 02/09/2021  Total Protein 6.5 - 8.1 g/dL 6.1(L) 6.5 7.5  Albumin 3.5 - 5.0 g/dL 2.1(L) 2.2(L) 2.6(L)  AST 15 - 41 U/L 49(H) 52(H) 53(H)  ALT 0 - 44 U/L 51(H) 57(H) 83(H)  Alk Phosphatase 38 - 126 U/L 430(H) 396(H) 470(H)  Total Bilirubin 0.3 - 1.2 mg/dL 1.9(H) 2.5(H) 3.3(H)  Bilirubin, Direct 0.00 - 0.40 mg/dL - - -     4)Sepsis secondary to presumed ascending cholangitis-- POA -Lactic acid not elevated -Patient met sepsis criteria on admission due to cholangitis with tachycardia, tachypnea and leukocytosis -IV antibiotics as above #2   5) iron deficiency anemia anemia---  no ongoing bleeding concerns,  Hgb dropping due to IV fluids/hemodilution -   6) hyponatremia--suspect due to dehydration in setting of  poor oral intake and persistent nausea IV fluids as ordered -Sodium has normalized with IV fluids   7)HTN--- amlodipine 5 mg as prescribed   Disposition/Need for in-Hospital Stay- patient unable to be discharged at this time due to -acute pancreatitis and possible ascending cholangitis requiring IV antibiotics and IV  fluids --Possible discharge home on Wednesday, 02/12/2021 if tolerating oral intake well without recurrence of abdominal symptoms   Status is: Inpatient   Remains inpatient appropriate because: Please see disposition above   Dispo: The patient is from: Home              Anticipated d/c is to: Home              Anticipated d/c date is: 1 day              Patient currently is not medically stable to d/c. Barriers: Not Clinically Stable-   Code Status :  -  Code Status: Full Code   Family Communication:    (patient is alert, awake and coherent)  Discussed with Daughter at bedside Consults  :  Gi   DVT Prophylaxis  :   - SCDs   heparin injection 5,000 Units Start: 02/09/21 1730 SCDs Start: 02/09/21 1721 Place TED hose Start: 02/09/21 1721    Lab Results  Component Value Date   PLT 201 02/11/2021    Inpatient Medications  Scheduled Meds:  amLODipine  5 mg Oral Daily   heparin  5,000 Units Subcutaneous Q8H   sodium chloride flush  3 mL Intravenous Q12H   sodium chloride flush  3 mL Intravenous Q12H   Continuous Infusions:  sodium chloride     ceFEPime (MAXIPIME) IV 2 g (02/11/21 0930)   dextrose 5 % and 0.9% NaCl 100 mL/hr at 02/10/21 1730   metronidazole 500 mg (02/11/21 1039)   PRN Meds:.sodium chloride, acetaminophen **OR** acetaminophen, bisacodyl, fentaNYL (SUBLIMAZE) injection, ondansetron **OR** ondansetron (ZOFRAN) IV, oxyCODONE, polyethylene glycol, sodium chloride flush, traZODone    Anti-infectives (From admission, onward)    Start     Dose/Rate Route Frequency Ordered Stop   02/09/21 1800  ceFEPIme (MAXIPIME) 2 g in sodium chloride 0.9 % 100 mL IVPB        2 g 200 mL/hr over 30 Minutes Intravenous Every 8 hours 02/09/21 0926     02/09/21 1800  metroNIDAZOLE (FLAGYL) IVPB 500 mg        500 mg 100 mL/hr over 60 Minutes Intravenous Every 8 hours 02/09/21 1215     02/09/21 0915  ceFEPIme (MAXIPIME) 2 g in sodium chloride 0.9 % 100 mL IVPB        2 g 200  mL/hr over 30 Minutes Intravenous  Once 02/09/21 0913 02/09/21 1032   02/09/21 0915  metroNIDAZOLE (FLAGYL) IVPB 500 mg        500 mg 100 mL/hr over 60 Minutes Intravenous  Once 02/09/21 0913 02/09/21 1139         Objective:   Vitals:   02/10/21 0915 02/10/21 1427 02/10/21 2058 02/11/21 0522  BP: 125/60 (!) 130/59 139/80 136/72  Pulse:  73 76 72  Resp:  _0 Temp:  98.4 F (36.9 C) 98.3 F (36.8 C) 97.8 F (36.6 C)  TempSrc:  Oral Oral Oral  SpO2:  100% 100% 100%  Weight:      Height:        Wt Readings from Last 3 Encounters:  02/09/21 68.2 kg  02/07/21 69.8 kg  12/02/20 70.7 kg  Intake/Output Summary (Last 24 hours) at 02/11/2021 1238 Last data filed at 02/11/2021 0900 Gross per 24 hour  Intake 2503.31 ml  Output 450 ml  Net 2053.31 ml     Physical Exam  Gen:- Awake Alert,  in no apparent distress  HEENT:- [place].AT,  +ve sclera icterus Neck-Supple Neck,No JVD,.  Lungs-  CTAB , fair symmetrical air movement CV- S1, S2 normal, regular  Abd-  +ve B.Sounds, Abd Soft, No significant tenderness,    Extremity/Skin:- No  edema, pedal pulses present  Psych-affect is appropriate, oriented x3 Neuro-no new focal deficits, no tremors   Data Review:   Micro Results Recent Results (from the past 240 hour(s))  Resp Panel by RT-PCR (Flu A&B, Covid) Nasopharyngeal Swab     Status: None   Collection Time: 02/04/21  1:10 PM   Specimen: Nasopharyngeal Swab; Nasopharyngeal(NP) swabs in vial transport medium  Result Value Ref Range Status   SARS Coronavirus 2 by RT PCR NEGATIVE NEGATIVE Final    Comment: (NOTE) SARS-CoV-2 target nucleic acids are NOT DETECTED.  The SARS-CoV-2 RNA is generally detectable in upper respiratory specimens during the acute phase of infection. The lowest concentration of SARS-CoV-2 viral copies this assay can detect is 138 copies/mL. A negative result does not preclude SARS-Cov-2 infection and should not be used as the sole basis for  treatment or other patient management decisions. A negative result may occur with  improper specimen collection/handling, submission of specimen other than nasopharyngeal swab, presence of viral mutation(s) within the areas targeted by this assay, and inadequate number of viral copies(<138 copies/mL). A negative result must be combined with clinical observations, patient history, and epidemiological information. The expected result is Negative.  Fact Sheet for Patients:  EntrepreneurPulse.com.au  Fact Sheet for Healthcare Providers:  IncredibleEmployment.be  This test is no t yet approved or cleared by the [place] FDA and  has been authorized for detection and/or diagnosis of SARS-CoV-2 by FDA under an Emergency Use Authorization (EUA). This EUA will remain  in effect (meaning this test can be used) for the duration of the COVID-19 declaration under Section 564(b)(1) of the Act, 21 U.S.C.section 360bbb-3(b)(1), unless the authorization is terminated  or revoked sooner.       Influenza A by PCR NEGATIVE NEGATIVE Final   Influenza B by PCR NEGATIVE NEGATIVE Final    Comment: (NOTE) The Xpert Xpress SARS-CoV-2/FLU/RSV plus assay is intended as an aid in the diagnosis of influenza from Nasopharyngeal swab specimens and should not be used as a sole basis for treatment. Nasal washings and aspirates are unacceptable for Xpert Xpress SARS-CoV-2/FLU/RSV testing.  Fact Sheet for Patients: EntrepreneurPulse.com.au  Fact Sheet for Healthcare Providers: IncredibleEmployment.be  This test is not yet approved or cleared by the [place] FDA and has been authorized for detection and/or diagnosis of SARS-CoV-2 by FDA under an Emergency Use Authorization (EUA). This EUA will remain in effect (meaning this test can be used) for the duration of the COVID-19 declaration under Section 564(b)(1) of the Act, 21  U.S.C. section 360bbb-3(b)(1), unless the authorization is terminated or revoked.  Performed at [facility], [address], [place], [place] [address]   Blood culture (routine x 2)     Status: None (Preliminary result)   Collection Time: 02/09/21  9:29 AM   Specimen: BLOOD  Result Value Ref Range Status   Specimen Description BLOOD  Final   Special Requests NONE  Final   Culture   Final    NO GROWTH 2 DAYS  Performed at [facility], [address], [place], [place] [address]    Report Status PENDING  Incomplete  Blood culture (routine x 2)     Status: None (Preliminary result)   Collection Time: 02/09/21  9:45 AM   Specimen: BLOOD  Result Value Ref Range Status   Specimen Description BLOOD  Final   Special Requests NONE  Final   Culture   Final    NO GROWTH 2 DAYS Performed at [facility], [address], [place], [place] [address]    Report Status PENDING  Incomplete  SARS CORONAVIRUS 2 (TAT 6-24 HRS) Nasopharyngeal Nasopharyngeal Swab     Status: None   Collection Time: 02/09/21  5:43 PM   Specimen: Nasopharyngeal Swab  Result Value Ref Range Status   SARS Coronavirus 2 NEGATIVE NEGATIVE Final    Comment: (NOTE) SARS-CoV-2 target nucleic acids are NOT DETECTED.  The SARS-CoV-2 RNA is generally detectable in upper and lower respiratory specimens during the acute phase of infection. Negative results do not preclude SARS-CoV-2 infection, do not rule out co-infections with other pathogens, and should not be used as the sole basis for treatment or other patient management decisions. Negative results must be combined with clinical observations, patient history, and epidemiological information. The expected result is Negative.  Fact Sheet for Patients: SugarRoll.be  Fact Sheet for Healthcare Providers: https://www.woods-mathews.com/  This test is not yet approved or cleared by the [place] FDA and  has been authorized for  detection and/or diagnosis of SARS-CoV-2 by FDA under an Emergency Use Authorization (EUA). This EUA will remain  in effect (meaning this test can be used) for the duration of the COVID-19 declaration under Se ction 564(b)(1) of the Act, 21 U.S.C. section 360bbb-3(b)(1), unless the authorization is terminated or revoked sooner.  Performed at [facility], [place] [address], [place], [place] [address]     Radiology Reports CT ABDOMEN PELVIS W CONTRAST  Result Date: 02/09/2021 CLINICAL DATA:  Nausea and vomiting. Rising bilirubin. Known choledocholithiasis. EXAM: CT ABDOMEN AND PELVIS WITH CONTRAST TECHNIQUE: Multidetector CT imaging of the abdomen and pelvis was performed using the standard protocol following bolus administration of intravenous contrast. CONTRAST:  38m OMNIPAQUE IOHEXOL 350 MG/ML SOLN COMPARISON:  MRI February 04, 2021.  Ultrasound December 31, 2020. FINDINGS: Lower chest: There is a small hiatal hernia. No other abnormalities in the lower lungs or chest. Hepatobiliary: The gallbladder is relatively contracted with mucosal enhancement towards the fundus. There is a focus of air in the gallbladder. A biliary stent is seen in the common bile duct. Pneumobilia is identified consistent with the stent and recent ERCP. Mild intrahepatic ductal dilatation remains. No choledocholithiasis identified on this study. A few scattered low-attenuation lesions in the liver too small to characterize but likely tiny cysts. The largest cyst is seen in the right hepatic lobe on axial image 27. No suspicious liver masses. Pancreas: There is fat stranding surrounding the length of the pancreas without fluid collection or pancreatic necrosis identified. Spleen: Normal in size without focal abnormality. Adrenals/Urinary Tract: Adrenal glands are normal. Bilateral renal cysts are identified. Parapelvic cysts are associated with both kidneys. No hydronephrosis. No ureteral stones are noted. The bladder is  unremarkable. Stomach/Bowel: Other than the hiatal hernia, the stomach is normal. The small bowel is normal. The colon and appendix are unremarkable. Vascular/Lymphatic: Calcified atherosclerosis is seen in the nonaneurysmal abdominal aorta, extending into the iliac and femoral vessels. A few reactive nodes are seen in the porta hepatis. No  other abnormal nodes identified. Reproductive: Prostate is unremarkable. Other: No abdominal wall hernia or abnormality. No abdominopelvic ascites. Musculoskeletal: No acute or significant osseous findings. IMPRESSION: 1. Diffuse fat stranding around the pancreas most consistent with pancreatitis. Recommend correlation with lipase. 2. A common bile duct stent is identified without identified choledocholithiasis. Mild intrahepatic ductal dilatation remains. Air in the gallbladder and pneumobilia are consistent with recent ERCP and the common bile duct stent. 3. Mild enhancement in the gallbladder mucosa towards the fundus suggests hyperemia and probably some inflammation. 4. Small hiatal hernia. 5. Calcified atherosclerosis in the abdominal aorta, iliac vessels, and femoral vessels. Electronically Signed   By: Dorise Bullion III M.D.   On: 02/09/2021 12:04   MR 3D Recon At Scanner  Result Date: 02/04/2021 CLINICAL DATA:  Jaundice, upper abdominal pain, abnormal ultrasound, malignancy suspected EXAM: MRI ABDOMEN WITHOUT AND WITH CONTRAST (INCLUDING MRCP) TECHNIQUE: Multiplanar multisequence MR imaging of the abdomen was performed both before and after the administration of intravenous contrast. Heavily T2-weighted images of the biliary and pancreatic ducts were obtained, and three-dimensional MRCP images were rendered by post processing. CONTRAST:  67m GADAVIST GADOBUTROL 1 MMOL/ML IV SOLN COMPARISON:  Right upper quadrant ultrasound, 12/31/2020 FINDINGS: Examination is generally somewhat limited by breath motion artifact, particularly on contrast enhanced sequences. Lower  chest: No acute findings. Hepatobiliary: No mass or other parenchymal abnormality identified. Relatively contracted gallbladder containing multiple gallstones. There is gross intra and extrahepatic biliary ductal dilatation, the common bile duct measuring up to 2.1 cm centrally (series 3, image 16) with numerous gallstones (>10) of varying sizes present in the pancreatic portion of the common bile duct, the largest near the ampulla measuring at least 1.3 cm (series 3, image 16). Pancreas: No mass, inflammatory changes, or other parenchymal abnormality identified. No pancreatic ductal dilatation. Spleen:  Within normal limits in size and appearance. Adrenals/Urinary Tract: No masses identified. Simple, benign bilateral renal cortical and parapelvic cysts. No evidence of hydronephrosis. Stomach/Bowel: Visualized portions within the abdomen are unremarkable. Vascular/Lymphatic: No pathologically enlarged lymph nodes identified. No abdominal aortic aneurysm demonstrated. Other:  None. Musculoskeletal: No suspicious bone lesions identified. IMPRESSION: 1. There is gross intra and extrahepatic biliary ductal dilatation, the common bile duct measuring up to 2.1 cm centrally with numerous gallstones (>10) present in the pancreatic portion of the common bile duct, the largest near the ampulla measuring at least 1.3 cm. Findings are consistent with choledocholithiasis. 2. Relatively contracted gallbladder containing multiple additional gallstones. Electronically Signed   By: AEddie CandleM.D.   On: 02/04/2021 11:35   DG ERCP  Result Date: 02/06/2021 CLINICAL DATA:  Choledocholithiasis. EXAM: ERCP TECHNIQUE: Multiple spot images obtained with the fluoroscopic device and submitted for interpretation post-procedure. COMPARISON:  MRI/MRCP on 02/04/2021 FINDINGS: Imaging with a C-arm during the procedure demonstrates cannulation of the common bile duct with contrast injection demonstrating a diffusely dilated common bile duct  packed with a multitude calculi. Unclear by submitted imaging whether stone extraction was performed. A biliary stent was placed extending from the confluence of right and left intrahepatic ducts into the duodenum. IMPRESSION: Diffusely dilated common bile duct packed with multiple calculi. During the procedure a biliary stent was placed. These images were submitted for radiologic interpretation only. Please see the procedural report for the amount of contrast and the fluoroscopy time utilized. Electronically Signed   By: GAletta EdouardM.D.   On: 02/06/2021 11:14   MR ABDOMEN MRCP W WO CONTAST  Result Date: 02/04/2021 CLINICAL DATA:  Jaundice, upper  abdominal pain, abnormal ultrasound, malignancy suspected EXAM: MRI ABDOMEN WITHOUT AND WITH CONTRAST (INCLUDING MRCP) TECHNIQUE: Multiplanar multisequence MR imaging of the abdomen was performed both before and after the administration of intravenous contrast. Heavily T2-weighted images of the biliary and pancreatic ducts were obtained, and three-dimensional MRCP images were rendered by post processing. CONTRAST:  33m GADAVIST GADOBUTROL 1 MMOL/ML IV SOLN COMPARISON:  Right upper quadrant ultrasound, 12/31/2020 FINDINGS: Examination is generally somewhat limited by breath motion artifact, particularly on contrast enhanced sequences. Lower chest: No acute findings. Hepatobiliary: No mass or other parenchymal abnormality identified. Relatively contracted gallbladder containing multiple gallstones. There is gross intra and extrahepatic biliary ductal dilatation, the common bile duct measuring up to 2.1 cm centrally (series 3, image 16) with numerous gallstones (>10) of varying sizes present in the pancreatic portion of the common bile duct, the largest near the ampulla measuring at least 1.3 cm (series 3, image 16). Pancreas: No mass, inflammatory changes, or other parenchymal abnormality identified. No pancreatic ductal dilatation. Spleen:  Within normal limits in  size and appearance. Adrenals/Urinary Tract: No masses identified. Simple, benign bilateral renal cortical and parapelvic cysts. No evidence of hydronephrosis. Stomach/Bowel: Visualized portions within the abdomen are unremarkable. Vascular/Lymphatic: No pathologically enlarged lymph nodes identified. No abdominal aortic aneurysm demonstrated. Other:  None. Musculoskeletal: No suspicious bone lesions identified. IMPRESSION: 1. There is gross intra and extrahepatic biliary ductal dilatation, the common bile duct measuring up to 2.1 cm centrally with numerous gallstones (>10) present in the pancreatic portion of the common bile duct, the largest near the ampulla measuring at least 1.3 cm. Findings are consistent with choledocholithiasis. 2. Relatively contracted gallbladder containing multiple additional gallstones. Electronically Signed   By: AEddie CandleM.D.   On: 02/04/2021 11:35     CBC Recent Labs  Lab 02/05/21 0340 02/06/21 0312 02/07/21 0402 02/09/21 0743 02/10/21 0711 02/11/21 0808  WBC 8.0 7.6 9.4 18.8* 12.8* 8.6  HGB 11.8* 12.0* 12.1* 12.4* 10.8* 10.3*  HCT 37.0* 37.8* 37.7* 37.3* 32.8* 32.0*  PLT 192 214 243 199 202 201  MCV 102.8* 103.8* 101.1* 102.5* 103.8* 104.9*  MCH 32.8 33.0 32.4 34.1* 34.2* 33.8  MCHC 31.9 31.7 32.1 33.2 32.9 32.2  RDW 15.1 15.4 15.2 14.8 15.0 14.6  LYMPHSABS 1.1 1.4 1.0 1.0  --  1.1  MONOABS 0.8 0.6 0.4 1.5*  --  0.6  EOSABS 0.1 0.1 0.0 0.0  --  0.2  BASOSABS 0.0 0.0 0.0 0.1  --  0.1    Chemistries  Recent Labs  Lab 02/05/21 0340 02/06/21 0312 02/07/21 0402 02/09/21 0743 02/10/21 0711 02/11/21 0808  NA 137 136 135 131* 137 135  K 3.6 3.4* 4.0 3.6 4.2 3.4*  CL 104 104 105 101 104 108  CO2 _0 GLUCOSE 87 90 131* 94 109* 112*  BUN _1 CREATININE 0.64 0.72 0.68 0.72 0.68 0.70  CALCIUM 8.8* 8.9 9.1 8.8* 8.6* 8.0*  MG 1.9 1.9 1.9  --   --   --   AST 218* 188* 111* 53* 52* 49*  ALT 174* 165* 128* 83* 57* 51*   ALKPHOS 633* 644* 596* 470* 396* 430*  BILITOT 5.8* 4.6* 2.8* 3.3* 2.5* 1.9*   ------------------------------------------------------------------------------------------------------------------ No results for input(s): CHOL, HDL, LDLCALC, TRIG, CHOLHDL, LDLDIRECT in the last 72 hours.  Lab Results  Component Value Date   HGBA1C 5.3 12/11/2020   ------------------------------------------------------------------------------------------------------------------ No results for input(s): TSH, T4TOTAL, T3FREE, THYROIDAB in the  last 72 hours.  Invalid input(s): FREET3 ------------------------------------------------------------------------------------------------------------------ No results for input(s): VITAMINB12, FOLATE, FERRITIN, TIBC, IRON, RETICCTPCT in the last 72 hours.  Coagulation profile Recent Labs  Lab 02/10/21 0711  INR 1.2    No results for input(s): DDIMER in the last 72 hours.  Cardiac Enzymes No results for input(s): CKMB, TROPONINI, MYOGLOBIN in the last 168 hours.  Invalid input(s): CK ------------------------------------------------------------------------------------------------------------------ No results found for: BNP   Roxan Hockey M.D on 02/11/2021 at 12:38 PM  Go to [email or web address] - for contact info  [facility] - Office  [phone]

## 2021-02-11 NOTE — Progress Notes (Signed)
Subjective: Doing well. Denies abdominal pain, nausea, vomiting.  Tolerated full liquid diet at lunch today.  No BM in a few days.  Passing plenty of gas.  Not interested in trying MiraLAX today.  Wants to see what happens with advancing his diet.  Objective: Vital signs in last 24 hours: Temp:  [97.8 F (36.6 C)-98.4 F (36.9 C)] 97.8 F (36.6 C) (08/16 0522) Pulse Rate:  [72-76] 72 (08/16 0522) Resp:  [16-20] 20 (08/16 0522) BP: (130-139)/(59-80) 136/72 (08/16 0522) SpO2:  [100 %] 100 % (08/16 0522) Last BM Date: 02/08/21 General:   Alert and oriented, pleasant, NAD. Head:  Normocephalic and atraumatic. Eyes:  No icterus, sclera clear. Conjuctiva pink.  Abdomen:  Bowel sounds present, soft, non-tender, non-distended. No HSM or hernias noted. No rebound or guarding. No masses appreciated  Msk:  Symmetrical without gross deformities. Normal posture. Extremities:  Without edema. Neurologic:  Alert and  oriented x4;  grossly normal neurologically. Skin:  Warm and dry, intact without significant lesions.  Psych:  Normal mood and affect.  Intake/Output from previous day: 08/15 0701 - 08/16 0700 In: 2263.3 [P.O.:840; I.V.:799.2; IV Piggyback:624.1] Out: 550 [Urine:550] Intake/Output this shift: Total I/O In: 240 [P.O.:240] Out: -   Lab Results: Recent Labs    02/09/21 0743 02/10/21 0711 02/11/21 0808  WBC 18.8* 12.8* 8.6  HGB 12.4* 10.8* 10.3*  HCT 37.3* 32.8* 32.0*  PLT 199 202 201   BMET Recent Labs    02/09/21 0743 02/10/21 0711 02/11/21 0808  NA 131* 137 135  K 3.6 4.2 3.4*  CL 101 104 108  CO2 24 24 23   GLUCOSE 94 109* 112*  BUN 21 16 12   CREATININE 0.72 0.68 0.70  CALCIUM 8.8* 8.6* 8.0*   LFT Recent Labs    02/09/21 0743 02/10/21 0711 02/11/21 0808  PROT 7.5 6.5 6.1*  ALBUMIN 2.6* 2.2* 2.1*  AST 53* 52* 49*  ALT 83* 57* 51*  ALKPHOS 470* 396* 430*  BILITOT 3.3* 2.5* 1.9*   PT/INR Recent Labs    02/10/21 0711  LABPROT 14.7  INR 1.2     Assessment: 80 year old male with history of choledocholithiasis last week undergoing ERCP with sphincterotomy and plastic stent placement by Dr. Laural Golden on 8/11; at that time, stones were immobile and stacked. Presenting 8/14 with acute abdominal pain, sweats, chills, and found to have leukocytosis with WBC count 18.8, elevated LFTs although improved from recent discharge, and lipase mildly elevated at 63 but with CT findings of pancreatitis. CBD stent in place without identified choledocholithiasis. Admitted with acute pancreatitis likely secondary to gallstones and unable to exclude post-ERCP pancreatitis as well as suspected cholangitis.   Clinically, he has improved with resolution of abdominal pain. Tolerating full liquid diet at lunch today. Leukocytosis improved from 18.8 to 8.6 today.  Blood cultures without growth x2 days. Slight bump in alk phos to 430 today from 396 yesterday, but AST, ALT, and total bilirubin continue to improve with AST 49, ALT 51, T bili 1.9.  Recommend completing a 7-day course of antibiotics.  He will need repeat ERCP in 4 to 6 weeks with stent removal.  He will also need cholecystectomy which will likely follow-up ERCP though timing will be deferred to surgical team.  Plan: Continue full liquids today.  If tolerated well, advance to soft diet tomorrow. Complete 7-day course of antibiotics. Continue to trend HFP. Outpatient ERCP for stent removal in 4 to 6 weeks. Patient needs cholecystectomy, likely following ERCP, but timing to  be determined per surgical team.   LOS: 2 days    02/11/2021, 2:02 PM   Aliene Altes, [facility]

## 2021-02-11 NOTE — Plan of Care (Signed)

## 2021-02-12 ENCOUNTER — Other Ambulatory Visit (INDEPENDENT_AMBULATORY_CARE_PROVIDER_SITE_OTHER): Payer: Self-pay

## 2021-02-12 ENCOUNTER — Encounter (INDEPENDENT_AMBULATORY_CARE_PROVIDER_SITE_OTHER): Payer: Self-pay

## 2021-02-12 DIAGNOSIS — K8050 Calculus of bile duct without cholangitis or cholecystitis without obstruction: Secondary | ICD-10-CM

## 2021-02-12 LAB — COMPREHENSIVE METABOLIC PANEL
ALT: 55 U/L — ABNORMAL HIGH (ref 0–44)
AST: 68 U/L — ABNORMAL HIGH (ref 15–41)
Albumin: 2.1 g/dL — ABNORMAL LOW (ref 3.5–5.0)
Alkaline Phosphatase: 562 U/L — ABNORMAL HIGH (ref 38–126)
Anion gap: 7 (ref 5–15)
BUN: 11 mg/dL (ref 8–23)
CO2: 26 mmol/L (ref 22–32)
Calcium: 8.5 mg/dL — ABNORMAL LOW (ref 8.9–10.3)
Chloride: 106 mmol/L (ref 98–111)
Creatinine, Ser: 0.73 mg/dL (ref 0.61–1.24)
GFR, Estimated: >60 mL/min (ref >60)
Glucose, Bld: 87 mg/dL (ref 70–99)
Potassium: 3.3 mmol/L — ABNORMAL LOW (ref 3.5–5.1)
Sodium: 139 mmol/L (ref 135–145)
Total Bilirubin: 1.7 mg/dL — ABNORMAL HIGH (ref 0.3–1.2)
Total Protein: 6.4 g/dL — ABNORMAL LOW (ref 6.5–8.1)

## 2021-02-12 LAB — CBC
HCT: 32.9 % — ABNORMAL LOW (ref 39.0–52.0)
Hemoglobin: 10.5 g/dL — ABNORMAL LOW (ref 13.0–17.0)
MCH: 32.8 pg (ref 26.0–34.0)
MCHC: 31.9 g/dL (ref 30.0–36.0)
MCV: 102.8 fL — ABNORMAL HIGH (ref 80.0–100.0)
Platelets: 213 10*3/uL (ref 150–400)
RBC: 3.2 MIL/uL — ABNORMAL LOW (ref 4.22–5.81)
RDW: 14.4 % (ref 11.5–15.5)
WBC: 7.4 10*3/uL (ref 4.0–10.5)
nRBC: 0 % (ref 0.0–0.2)

## 2021-02-12 LAB — GLUCOSE, CAPILLARY: Glucose-Capillary: 101 mg/dL — ABNORMAL HIGH (ref 70–99)

## 2021-02-12 MED ORDER — METRONIDAZOLE 500 MG PO TABS
500.0000 mg | ORAL_TABLET | Freq: Three times a day (TID) | ORAL | Status: DC
  Administered 2021-02-12: 500 mg via ORAL
  Filled 2021-02-12: qty 1

## 2021-02-12 MED ORDER — METRONIDAZOLE 500 MG PO TABS: 500.0000 mg | ORAL_TABLET | Freq: Three times a day (TID) | ORAL | 0 refills | Status: AC

## 2021-02-12 MED ORDER — OXYCODONE HCL 5 MG PO TABS: 5.0000 mg | ORAL_TABLET | ORAL | 0 refills | Status: AC | PRN

## 2021-02-12 MED ORDER — POLYETHYLENE GLYCOL 3350 17 G PO PACK: 17.0000 g | PACK | Freq: Every day | ORAL | 0 refills | Status: AC | PRN

## 2021-02-12 MED ORDER — CIPROFLOXACIN HCL 500 MG PO TABS: 500.0000 mg | ORAL_TABLET | Freq: Two times a day (BID) | ORAL | 0 refills | Status: AC

## 2021-02-12 MED ORDER — POTASSIUM CHLORIDE CRYS ER 20 MEQ PO TBCR
40.0000 meq | EXTENDED_RELEASE_TABLET | Freq: Once | ORAL | Status: AC
  Administered 2021-02-12: 40 meq via ORAL
  Filled 2021-02-12: qty 2

## 2021-02-12 NOTE — Discharge Summary (Signed)
Physician Discharge Summary [facility]    Patient: Dean Randolph                   Admit date: 02/09/2021   DOB: 1940-08-06             Discharge date:02/12/2021/9:19 AM [id number]                          PCP: Claretta Fraise, MD  Disposition: HOME  Recommendations for Outpatient Follow-up:   Follow up: GI in 4-6 weeks   Discharge Condition: Stable   Code Status:   Code Status: Full Code  Diet recommendation: Regular healthy diet advance as tolerated    Discharge Diagnoses:    Principal Problem:   Pancreatitis due to biliary obstruction/S/p ERCP with stent Placement on 02/06/21 Active Problems:   Choledocholithiasis/Cholelithiasis--S/p ERCP with Plastic Stent Placement on 02/06/21   Hyperbilirubinemia   Iron deficiency anemia   Transaminitis   Cholangitis   Essential hypertension   Acute cholangitis   History of Present Illness/ Hospital Course Kathleen Argue Summary:    Brief Summary:- 80 y.o. male with past medical history relevant for HTN who was recently admitted on 02/04/2021 through 02/07/2021 for choledocholithiasis and cholelithiasis--underwent  ERCP  on 02/06/21--common bile duct and common hepatic duct were moderately dilated containing multiple stones. Stones appeared to be all stacked up and immobile;  sphincterotomy performed and plastic stent placed in CBD readmitted on 02/10/2020 with concerns for acute pancreatitis and possible ascending cholangitis in view of recent ERCP    1)Acute Pancreatitis due to Biliary Obstruction--- cannot rule out possible post ERCP pancreatitis -But patient still has choledocholithiasis as stones were not retrieved during recent ERCP on 02/06/21 -Lipase is up to 63  >>> 43 today  -Lipase down to 43 -CT abdomen and pelvis on admission was consistent with acute pancreatitis - GI consulted was following  - tolerating OP now  -D/Ced IVF rate -c/n IV antiemetics and pain medications -discharge home tolerating oral intake   Improving  abdominal symptoms   2)Choledocholithiasis/Cholelithiasis/possible ascending cholangitis--S/p ERCP with Plastic Stent Placement on 02/06/21----clinical presentation this admission is suspicious for possible ascending cholangitis--patient was admitted with chills, night sweats, abdominal pain and nausea as well as new leukocytosis with WBC of 18.8, WBC was 9.4 on 02/07/2021 -WBC 18.8 >>12.8 >>8.6 >>7.4 - was on  IV cefepime and Flagyl pending culture data- NGT to date, changed to PO cipro and Flagyl   Per GI:  He will need to have repeat ERCP as outpatient to evaluate if the stone decreased in size and will be able to remove them.  This will need to be performed as outpatient, will repeat endoscopic retrograde cholangiopancreatography in 6 weeks.     3)Transaminitis--- secondary to biliary obstruction as above --LFTs are actually trending down compared to recent values Hepatic Function Latest Ref Rng & Units 02/11/2021 02/10/2021 02/09/2021  Total Protein 6.5 - 8.1 g/dL 6.1(L) 6.5 7.5  Albumin 3.5 - 5.0 g/dL 2.1(L) 2.2(L) 2.6(L)  AST 15 - 41 U/L 49(H) 52(H) 53(H)  ALT 0 - 44 U/L 51(H) 57(H) 83(H)  Alk Phosphatase 38 - 126 U/L 430(H) 396(H) 470(H)  Total Bilirubin 0.3 - 1.2 mg/dL 1.9(H) 2.5(H) 3.3(H)  Bilirubin, Direct 0.00 - 0.40 mg/dL - - -      4)Sepsis secondary to presumed ascending cholangitis-- POA -Lactic acid not elevated -Patient met sepsis criteria on admission due to cholangitis with tachycardia, tachypnea and leukocytosis -IV  antibiotics as above #2>>> changes to PO Cipro and Flagyl    5) iron deficiency anemia anemia---  no ongoing bleeding concerns,  Hgb dropping due to IV fluids/hemodilution... HH stable now  -   6) hyponatremia--suspect due to dehydration in setting of poor oral intake and persistent nausea IV fluids as ordered -Sodium has normalized with IV fluids... D/Ced now    7)HTN--- amlodipine 5 mg as prescribed      Status is: Inpatient    Remains inpatient appropriate because: Please see disposition above   Dispo: The patient is from: Home              Anticipated d/c is to: Home       Code Status :  -  Code Status: Full Code    Family Communication:    (patient is alert, awake and coherent)  Discussed with Daughter at bedside Consults  :  Gi       Discharge Instructions:   Discharge Instructions     Activity as tolerated - No restrictions   Complete by: As directed    Call MD for:  difficulty breathing, headache or visual disturbances   Complete by: As directed    Call MD for:  persistant dizziness or light-headedness   Complete by: As directed    Call MD for:  persistant nausea and vomiting   Complete by: As directed    Call MD for:  redness, tenderness, or signs of infection (pain, swelling, redness, odor or green/yellow discharge around incision site)   Complete by: As directed    Diet - low sodium heart healthy   Complete by: As directed    Discharge instructions   Complete by: As directed    F/up with GI in 4-6 wks   Increase activity slowly   Complete by: As directed         Medication List     TAKE these medications    acetaminophen 325 MG tablet Commonly known as: TYLENOL Take 650 mg by mouth every 6 (six) hours as needed for headache or mild pain.   amLODipine 5 MG tablet Commonly known as: NORVASC Take 1 tablet (5 mg total) by mouth daily.   ciprofloxacin 500 MG tablet Commonly known as: Cipro Take 1 tablet (500 mg total) by mouth 2 (two) times daily for 5 days.   metroNIDAZOLE 500 MG tablet Commonly known as: FLAGYL Take 1 tablet (500 mg total) by mouth every 8 (eight) hours for 5 days.   oxyCODONE 5 MG immediate release tablet Commonly known as: Oxy IR/ROXICODONE Take 1 tablet (5 mg total) by mouth every 4 (four) hours as needed for up to 3 days for moderate pain.   polyethylene glycol 17 g packet Commonly known as: MIRALAX / GLYCOLAX Take 17 g by mouth daily as  needed for up to 20 days for mild constipation.        No Known Allergies   Procedures /Studies:   CT ABDOMEN PELVIS W CONTRAST  Result Date: 02/09/2021 CLINICAL DATA:  Nausea and vomiting. Rising bilirubin. Known choledocholithiasis. EXAM: CT ABDOMEN AND PELVIS WITH CONTRAST TECHNIQUE: Multidetector CT imaging of the abdomen and pelvis was performed using the standard protocol following bolus administration of intravenous contrast. CONTRAST:  26m OMNIPAQUE IOHEXOL 350 MG/ML SOLN COMPARISON:  MRI February 04, 2021.  Ultrasound December 31, 2020. FINDINGS: Lower chest: There is a small hiatal hernia. No other abnormalities in the lower lungs or chest. Hepatobiliary: The gallbladder is relatively contracted with mucosal  enhancement towards the fundus. There is a focus of air in the gallbladder. A biliary stent is seen in the common bile duct. Pneumobilia is identified consistent with the stent and recent ERCP. Mild intrahepatic ductal dilatation remains. No choledocholithiasis identified on this study. A few scattered low-attenuation lesions in the liver too small to characterize but likely tiny cysts. The largest cyst is seen in the right hepatic lobe on axial image 27. No suspicious liver masses. Pancreas: There is fat stranding surrounding the length of the pancreas without fluid collection or pancreatic necrosis identified. Spleen: Normal in size without focal abnormality. Adrenals/Urinary Tract: Adrenal glands are normal. Bilateral renal cysts are identified. Parapelvic cysts are associated with both kidneys. No hydronephrosis. No ureteral stones are noted. The bladder is unremarkable. Stomach/Bowel: Other than the hiatal hernia, the stomach is normal. The small bowel is normal. The colon and appendix are unremarkable. Vascular/Lymphatic: Calcified atherosclerosis is seen in the nonaneurysmal abdominal aorta, extending into the iliac and femoral vessels. A few reactive nodes are seen in the porta hepatis.  No other abnormal nodes identified. Reproductive: Prostate is unremarkable. Other: No abdominal wall hernia or abnormality. No abdominopelvic ascites. Musculoskeletal: No acute or significant osseous findings. IMPRESSION: 1. Diffuse fat stranding around the pancreas most consistent with pancreatitis. Recommend correlation with lipase. 2. A common bile duct stent is identified without identified choledocholithiasis. Mild intrahepatic ductal dilatation remains. Air in the gallbladder and pneumobilia are consistent with recent ERCP and the common bile duct stent. 3. Mild enhancement in the gallbladder mucosa towards the fundus suggests hyperemia and probably some inflammation. 4. Small hiatal hernia. 5. Calcified atherosclerosis in the abdominal aorta, iliac vessels, and femoral vessels. Electronically Signed   By: Dorise Bullion III M.D.   On: 02/09/2021 12:04   MR 3D Recon At Scanner  Result Date: 02/04/2021 CLINICAL DATA:  Jaundice, upper abdominal pain, abnormal ultrasound, malignancy suspected EXAM: MRI ABDOMEN WITHOUT AND WITH CONTRAST (INCLUDING MRCP) TECHNIQUE: Multiplanar multisequence MR imaging of the abdomen was performed both before and after the administration of intravenous contrast. Heavily T2-weighted images of the biliary and pancreatic ducts were obtained, and three-dimensional MRCP images were rendered by post processing. CONTRAST:  83m GADAVIST GADOBUTROL 1 MMOL/ML IV SOLN COMPARISON:  Right upper quadrant ultrasound, 12/31/2020 FINDINGS: Examination is generally somewhat limited by breath motion artifact, particularly on contrast enhanced sequences. Lower chest: No acute findings. Hepatobiliary: No mass or other parenchymal abnormality identified. Relatively contracted gallbladder containing multiple gallstones. There is gross intra and extrahepatic biliary ductal dilatation, the common bile duct measuring up to 2.1 cm centrally (series 3, image 16) with numerous gallstones (>10) of varying  sizes present in the pancreatic portion of the common bile duct, the largest near the ampulla measuring at least 1.3 cm (series 3, image 16). Pancreas: No mass, inflammatory changes, or other parenchymal abnormality identified. No pancreatic ductal dilatation. Spleen:  Within normal limits in size and appearance. Adrenals/Urinary Tract: No masses identified. Simple, benign bilateral renal cortical and parapelvic cysts. No evidence of hydronephrosis. Stomach/Bowel: Visualized portions within the abdomen are unremarkable. Vascular/Lymphatic: No pathologically enlarged lymph nodes identified. No abdominal aortic aneurysm demonstrated. Other:  None. Musculoskeletal: No suspicious bone lesions identified. IMPRESSION: 1. There is gross intra and extrahepatic biliary ductal dilatation, the common bile duct measuring up to 2.1 cm centrally with numerous gallstones (>10) present in the pancreatic portion of the common bile duct, the largest near the ampulla measuring at least 1.3 cm. Findings are consistent with choledocholithiasis. 2. Relatively contracted  gallbladder containing multiple additional gallstones. Electronically Signed   By: Eddie Candle M.D.   On: 02/04/2021 11:35   DG ERCP  Result Date: 02/06/2021 CLINICAL DATA:  Choledocholithiasis. EXAM: ERCP TECHNIQUE: Multiple spot images obtained with the fluoroscopic device and submitted for interpretation post-procedure. COMPARISON:  MRI/MRCP on 02/04/2021 FINDINGS: Imaging with a C-arm during the procedure demonstrates cannulation of the common bile duct with contrast injection demonstrating a diffusely dilated common bile duct packed with a multitude calculi. Unclear by submitted imaging whether stone extraction was performed. A biliary stent was placed extending from the confluence of right and left intrahepatic ducts into the duodenum. IMPRESSION: Diffusely dilated common bile duct packed with multiple calculi. During the procedure a biliary stent was placed.  These images were submitted for radiologic interpretation only. Please see the procedural report for the amount of contrast and the fluoroscopy time utilized. Electronically Signed   By: Aletta Edouard M.D.   On: 02/06/2021 11:14   MR ABDOMEN MRCP W WO CONTAST  Result Date: 02/04/2021 CLINICAL DATA:  Jaundice, upper abdominal pain, abnormal ultrasound, malignancy suspected EXAM: MRI ABDOMEN WITHOUT AND WITH CONTRAST (INCLUDING MRCP) TECHNIQUE: Multiplanar multisequence MR imaging of the abdomen was performed both before and after the administration of intravenous contrast. Heavily T2-weighted images of the biliary and pancreatic ducts were obtained, and three-dimensional MRCP images were rendered by post processing. CONTRAST:  61m GADAVIST GADOBUTROL 1 MMOL/ML IV SOLN COMPARISON:  Right upper quadrant ultrasound, 12/31/2020 FINDINGS: Examination is generally somewhat limited by breath motion artifact, particularly on contrast enhanced sequences. Lower chest: No acute findings. Hepatobiliary: No mass or other parenchymal abnormality identified. Relatively contracted gallbladder containing multiple gallstones. There is gross intra and extrahepatic biliary ductal dilatation, the common bile duct measuring up to 2.1 cm centrally (series 3, image 16) with numerous gallstones (>10) of varying sizes present in the pancreatic portion of the common bile duct, the largest near the ampulla measuring at least 1.3 cm (series 3, image 16). Pancreas: No mass, inflammatory changes, or other parenchymal abnormality identified. No pancreatic ductal dilatation. Spleen:  Within normal limits in size and appearance. Adrenals/Urinary Tract: No masses identified. Simple, benign bilateral renal cortical and parapelvic cysts. No evidence of hydronephrosis. Stomach/Bowel: Visualized portions within the abdomen are unremarkable. Vascular/Lymphatic: No pathologically enlarged lymph nodes identified. No abdominal aortic aneurysm  demonstrated. Other:  None. Musculoskeletal: No suspicious bone lesions identified. IMPRESSION: 1. There is gross intra and extrahepatic biliary ductal dilatation, the common bile duct measuring up to 2.1 cm centrally with numerous gallstones (>10) present in the pancreatic portion of the common bile duct, the largest near the ampulla measuring at least 1.3 cm. Findings are consistent with choledocholithiasis. 2. Relatively contracted gallbladder containing multiple additional gallstones. Electronically Signed   By: AEddie CandleM.D.   On: 02/04/2021 11:35    Subjective:   Patient was seen and examined 02/12/2021, 9:19 AM Patient stable today. No acute distress.  No issues overnight Stable for discharge.  Discharge Exam:    Vitals:   02/11/21 0522 02/11/21 2144 02/12/21 0544 02/12/21 0816  BP: 136/72 136/73 123/73 122/72  Pulse: 72 72 63 63  Resp: 20 20 14    Temp: 97.8 F (36.6 C) 98.3 F (36.8 C) 98.3 F (36.8 C)   TempSrc: Oral Oral Oral   SpO2: 100% 100% 100% 100%  Weight:      Height:        General: Pt lying comfortably in bed & appears in no obvious distress. Cardiovascular: S1 &  S2 heard, RRR, S1/S2 +. No murmurs, rubs, gallops or clicks. No JVD or pedal edema. Respiratory: Clear to auscultation without wheezing, rhonchi or crackles. No increased work of breathing. Abdominal:  Non-distended, non-tender & soft. No organomegaly or masses appreciated. Normal bowel sounds heard. CNS: Alert and oriented. No focal deficits. Extremities: no edema, no cyanosis      The results of significant diagnostics from this hospitalization (including imaging, microbiology, ancillary and laboratory) are listed below for reference.      Microbiology:   Recent Results (from the past 240 hour(s))  Resp Panel by RT-PCR (Flu A&B, Covid) Nasopharyngeal Swab     Status: None   Collection Time: 02/04/21  1:10 PM   Specimen: Nasopharyngeal Swab; Nasopharyngeal(NP) swabs in vial transport medium   Result Value Ref Range Status   SARS Coronavirus 2 by RT PCR NEGATIVE NEGATIVE Final    Comment: (NOTE) SARS-CoV-2 target nucleic acids are NOT DETECTED.  The SARS-CoV-2 RNA is generally detectable in upper respiratory specimens during the acute phase of infection. The lowest concentration of SARS-CoV-2 viral copies this assay can detect is 138 copies/mL. A negative result does not preclude SARS-Cov-2 infection and should not be used as the sole basis for treatment or other patient management decisions. A negative result may occur with  improper specimen collection/handling, submission of specimen other than nasopharyngeal swab, presence of viral mutation(s) within the areas targeted by this assay, and inadequate number of viral copies(<138 copies/mL). A negative result must be combined with clinical observations, patient history, and epidemiological information. The expected result is Negative.  Fact Sheet for Patients:  EntrepreneurPulse.com.au  Fact Sheet for Healthcare Providers:  IncredibleEmployment.be  This test is no t yet approved or cleared by the [place] FDA and  has been authorized for detection and/or diagnosis of SARS-CoV-2 by FDA under an Emergency Use Authorization (EUA). This EUA will remain  in effect (meaning this test can be used) for the duration of the COVID-19 declaration under Section 564(b)(1) of the Act, 21 U.S.C.section 360bbb-3(b)(1), unless the authorization is terminated  or revoked sooner.       Influenza A by PCR NEGATIVE NEGATIVE Final   Influenza B by PCR NEGATIVE NEGATIVE Final    Comment: (NOTE) The Xpert Xpress SARS-CoV-2/FLU/RSV plus assay is intended as an aid in the diagnosis of influenza from Nasopharyngeal swab specimens and should not be used as a sole basis for treatment. Nasal washings and aspirates are unacceptable for Xpert Xpress SARS-CoV-2/FLU/RSV testing.  Fact Sheet for  Patients: EntrepreneurPulse.com.au  Fact Sheet for Healthcare Providers: IncredibleEmployment.be  This test is not yet approved or cleared by the [place] FDA and has been authorized for detection and/or diagnosis of SARS-CoV-2 by FDA under an Emergency Use Authorization (EUA). This EUA will remain in effect (meaning this test can be used) for the duration of the COVID-19 declaration under Section 564(b)(1) of the Act, 21 U.S.C. section 360bbb-3(b)(1), unless the authorization is terminated or revoked.  Performed at [facility], [address] [place], [place] [address]   Blood culture (routine x 2)     Status: None (Preliminary result)   Collection Time: 02/09/21  9:29 AM   Specimen: BLOOD  Result Value Ref Range Status   Specimen Description BLOOD  Final   Special Requests NONE  Final   Culture   Final    NO GROWTH 3 DAYS Performed at [facility], [address], [place], [place] [address]    Report Status PENDING  Incomplete  Blood culture (routine x 2)     Status: None (Preliminary result)   Collection Time: 02/09/21  9:45 AM   Specimen: BLOOD  Result Value Ref Range Status   Specimen Description BLOOD  Final   Special Requests NONE  Final   Culture   Final    NO GROWTH 3 DAYS Performed at [facility], [address] [place], [place] [address]    Report Status PENDING  Incomplete  SARS CORONAVIRUS 2 (TAT 6-24 HRS) Nasopharyngeal Nasopharyngeal Swab     Status: None   Collection Time: 02/09/21  5:43 PM   Specimen: Nasopharyngeal Swab  Result Value Ref Range Status   SARS Coronavirus 2 NEGATIVE NEGATIVE Final    Comment: (NOTE) SARS-CoV-2 target nucleic acids are NOT DETECTED.  The SARS-CoV-2 RNA is generally detectable in upper and lower respiratory specimens during the acute phase of infection. Negative results do not preclude SARS-CoV-2 infection, do not rule out co-infections with other pathogens, and should not be used  as the sole basis for treatment or other patient management decisions. Negative results must be combined with clinical observations, patient history, and epidemiological information. The expected result is Negative.  Fact Sheet for Patients: SugarRoll.be  Fact Sheet for Healthcare Providers: https://www.woods-mathews.com/  This test is not yet approved or cleared by the [place] FDA and  has been authorized for detection and/or diagnosis of SARS-CoV-2 by FDA under an Emergency Use Authorization (EUA). This EUA will remain  in effect (meaning this test can be used) for the duration of the COVID-19 declaration under Se ction 564(b)(1) of the Act, 21 U.S.C. section 360bbb-3(b)(1), unless the authorization is terminated or revoked sooner.  Performed at [facility], [place] [address], [place],  [address]      Labs:   CBC: Recent Labs  Lab 02/06/21 [phone] 02/07/21 0402 02/09/21 0743 02/10/21 0711 02/11/21 0808 02/12/21 0415  WBC 7.6 9.4 18.8* 12.8* 8.6 7.4  NEUTROABS 5.4 8.0* 16.2*  --  6.6  --   HGB 12.0* 12.1* 12.4* 10.8* 10.3* 10.5*  HCT 37.8* 37.7* 37.3* 32.8* 32.0* 32.9*  MCV 103.8* 101.1* 102.5* 103.8* 104.9* 102.8*  PLT 214 243 199 202 201 734   Basic Metabolic Panel: Recent Labs  Lab 02/06/21 0312 02/07/21 0402 02/09/21 0743 02/10/21 0711 02/11/21 0808 02/12/21 0415  NA 136 135 131* 137 135 139  K 3.4* 4.0 3.6 4.2 3.4* 3.3*  CL 104 105 101 104 108 106  CO2 25 24 24 24 23 26   GLUCOSE 90 131* 94 109* 112* 87  BUN 13 15 21 16 12 11   CREATININE 0.72 0.68 0.72 0.68 0.70 0.73  CALCIUM 8.9 9.1 8.8* 8.6* 8.0* 8.5*  MG 1.9 1.9  --   --   --   --    Liver Function Tests: Recent Labs  Lab 02/07/21 0402 02/09/21 0743 02/10/21 0711 02/11/21 0808 02/12/21 0415  AST 111* 53* 52* 49* 68*  ALT 128* 83* 57* 51* 55*  ALKPHOS 596* 470* 396* 430* 562*  BILITOT 2.8* 3.3* 2.5* 1.9* 1.7*  PROT 7.0 7.5 6.5 6.1*  6.4*  ALBUMIN 2.5* 2.6* 2.2* 2.1* 2.1*   BNP (last 3 results) No results for input(s): BNP in the last 8760 hours. Cardiac Enzymes: No results for input(s): CKTOTAL, CKMB, CKMBINDEX, TROPONINI in the last 168 hours. CBG: Recent Labs  Lab 02/11/21 0525 02/11/21 1148 02/11/21 1712 02/11/21 2347 02/12/21 0547  GLUCAP 110* 85 84 98 101*   Hgb A1c No results for  input(s): HGBA1C in the last 72 hours. Lipid Profile No results for input(s): CHOL, HDL, LDLCALC, TRIG, CHOLHDL, LDLDIRECT in the last 72 hours. Thyroid function studies No results for input(s): TSH, T4TOTAL, T3FREE, THYROIDAB in the last 72 hours.  Invalid input(s): FREET3 Anemia work up No results for input(s): VITAMINB12, FOLATE, FERRITIN, TIBC, IRON, RETICCTPCT in the last 72 hours. Urinalysis    Component Value Date/Time   COLORURINE AMBER (A) 02/09/2021 0821   APPEARANCEUR HAZY (A) 02/09/2021 0821   LABSPEC 1.021 02/09/2021 0821   PHURINE 5.0 02/09/2021 0821   GLUCOSEU NEGATIVE 02/09/2021 0821   HGBUR MODERATE (A) 02/09/2021 0821   BILIRUBINUR NEGATIVE 02/09/2021 0821   KETONESUR NEGATIVE 02/09/2021 0821   PROTEINUR 30 (A) 02/09/2021 0821   NITRITE NEGATIVE 02/09/2021 0821   LEUKOCYTESUR NEGATIVE 02/09/2021 0821         Time coordinating discharge: Over 45 minutes  SIGNED: Deatra James, MD, FACP, FHM. [facility],  Please use [email or web address] to Page If 7PM-7AM, please contact night-coverage [email or web address] 02/12/2021, 9:19 AM

## 2021-02-13 ENCOUNTER — Telehealth: Payer: Self-pay | Admitting: Gastroenterology

## 2021-02-13 NOTE — Telephone Encounter (Signed)
Patient was hospitalized again over the weekend, discharged on 8/17. He is already scheduled for repeat ERCP with Dr. Karilyn Cota in September.

## 2021-02-13 NOTE — Telephone Encounter (Signed)
error 

## 2021-02-14 LAB — CULTURE, BLOOD (ROUTINE X 2)
Culture: NO GROWTH
Culture: NO GROWTH

## 2021-03-19 NOTE — Patient Instructions (Signed)
Dean Randolph  03/19/2021     @PREFPERIOPPHARMACY @   Your procedure is scheduled on  03/26/2021.   Report to 03/28/2021 at  1045 A.M.   Call this number if you have problems the morning of surgery:  [phone]   Remember:  Do not eat or drink after midnight.      Take these medicines the morning of surgery with A SIP OF WATER                                  amlodipine.     Do not wear jewelry, make-up or nail polish.  Do not wear lotions, powders, or perfumes, or deodorant.  Do not shave 48 hours prior to surgery.  Men may shave face and neck.  Do not bring valuables to the hospital.  [facility] is not responsible for any belongings or valuables.  Contacts, dentures or bridgework may not be worn into surgery.  Leave your suitcase in the car.  After surgery it may be brought to your room.  For patients admitted to the hospital, discharge time will be determined by your treatment team.  Patients discharged the day of surgery will not be allowed to drive home and must have someone with them for 24 hours.    Special instructions:   DO NOT smoke tobacco or vape for 24 hours before your procedure.  Please read over the following fact sheets that you were given. Anesthesia Post-op Instructions and Care and Recovery After Surgery      Endoscopic Retrograde Cholangiopancreatogram, Care After This sheet gives you information about how to care for yourself after your procedure. Your health care provider may also give you more specific instructions. If you have problems or questions, contact your health care provider. What can I expect after the procedure? After the procedure, it is common to have: Soreness in your throat. Nausea. Bloating. Dizziness. Tiredness (fatigue). Follow these instructions at home:  Take over-the-counter and prescription medicines only as told by your health care provider. If you were prescribed an antibiotic medicine, take it as told  by your health care provider. Do not stop using the antibiotic even if you start to feel better. If you were given a sedative during the procedure, it can affect you for several hours. Do not drive or operate machinery until your health care provider says that it is safe. Have someone stay with you for 24 hours after the procedure. Return to your normal activities as told by your health care provider. Ask your health care provider what activities are safe for you. Return to eating what you normally do as soon as you feel well enough or as told by your health care provider. Keep all follow-up visits as told by your health care provider. This is important. Contact a health care provider if you: Have pain in your abdomen that does not get better with medicine. Develop signs of infection, such as: Chills or fever. Feeling unwell. Get help right away if you: Have difficulty swallowing. Have worsening pain in your throat, chest, or abdomen. Vomit bright red blood or a substance that looks like coffee grounds. Have bloody or black, tarry stools. Have a fever. Have a sudden increase in swelling (bloating) in your abdomen. Summary After the procedure, it is common to feel tired, and to have some discomfort in your throat or some bloating of your abdomen.  If you were given a sedative during the procedure, it can affect you for several hours. Do not drive or operate machinery until your health care provider says that it is safe. Have someone stay with you for 24 hours after the procedure. Contact your health care provider if you have signs of infection, such as chills, fever, feeling unwell, or if you have pain that does not improve with medicine. Get help right away if you have trouble swallowing, worsening pain, bloody or black vomit, bloody or black stools, a fever, or increased swelling in your abdomen. This information is not intended to replace advice given to you by your health care provider. Make  sure you discuss any questions you have with your health care provider. Document Revised: 09/25/2020 Document Reviewed: 02/28/2019 Elsevier Patient Education  2022 [employer] General Anesthesia, Adult, Care After This sheet gives you information about how to care for yourself after your procedure. Your health care provider may also give you more specific instructions. If you have problems or questions, contact your health care provider. What can I expect after the procedure? After the procedure, the following side effects are common: Pain or discomfort at the IV site. Nausea. Vomiting. Sore throat. Trouble concentrating. Feeling cold or chills. Feeling weak or tired. Sleepiness and fatigue. Soreness and body aches. These side effects can affect parts of the body that were not involved in surgery. Follow these instructions at home: For the time period you were told by your health care provider:  Rest. Do not participate in activities where you could fall or become injured. Do not drive or use machinery. Do not drink alcohol. Do not take sleeping pills or medicines that cause drowsiness. Do not make important decisions or sign legal documents. Do not take care of children on your own. Eating and drinking Follow any instructions from your health care provider about eating or drinking restrictions. When you feel hungry, start by eating small amounts of foods that are soft and easy to digest (bland), such as toast. Gradually return to your regular diet. Drink enough fluid to keep your urine pale yellow. If you vomit, rehydrate by drinking water, juice, or clear broth. General instructions If you have sleep apnea, surgery and certain medicines can increase your risk for breathing problems. Follow instructions from your health care provider about wearing your sleep device: Anytime you are sleeping, including during daytime naps. While taking prescription pain medicines, sleeping  medicines, or medicines that make you drowsy. Have a responsible adult stay with you for the time you are told. It is important to have someone help care for you until you are awake and alert. Return to your normal activities as told by your health care provider. Ask your health care provider what activities are safe for you. Take over-the-counter and prescription medicines only as told by your health care provider. If you smoke, do not smoke without supervision. Keep all follow-up visits as told by your health care provider. This is important. Contact a health care provider if: You have nausea or vomiting that does not get better with medicine. You cannot eat or drink without vomiting. You have pain that does not get better with medicine. You are unable to pass urine. You develop a skin rash. You have a fever. You have redness around your IV site that gets worse. Get help right away if: You have difficulty breathing. You have chest pain. You have blood in your urine or stool, or you vomit blood. Summary After  the procedure, it is common to have a sore throat or nausea. It is also common to feel tired. Have a responsible adult stay with you for the time you are told. It is important to have someone help care for you until you are awake and alert. When you feel hungry, start by eating small amounts of foods that are soft and easy to digest (bland), such as toast. Gradually return to your regular diet. Drink enough fluid to keep your urine pale yellow. Return to your normal activities as told by your health care provider. Ask your health care provider what activities are safe for you. This information is not intended to replace advice given to you by your health care provider. Make sure you discuss any questions you have with your health care provider. Document Revised: 02/29/2020 Document Reviewed: 09/28/2019 Elsevier Patient Education  2022 [employer].

## 2021-03-24 ENCOUNTER — Other Ambulatory Visit: Payer: Self-pay

## 2021-03-24 ENCOUNTER — Encounter (HOSPITAL_COMMUNITY): Payer: Self-pay

## 2021-03-24 ENCOUNTER — Encounter (HOSPITAL_COMMUNITY)
Admit: 2021-03-24 | Discharge: 2021-03-24 | Disposition: A | Discharge status: Home / Self Care | Payer: Medicare Other | Attending: Internal Medicine | Admitting: Internal Medicine

## 2021-03-26 ENCOUNTER — Ambulatory Visit (HOSPITAL_COMMUNITY): Payer: Medicare Other

## 2021-03-26 ENCOUNTER — Encounter (HOSPITAL_COMMUNITY): Payer: Self-pay | Admitting: Internal Medicine

## 2021-03-26 ENCOUNTER — Other Ambulatory Visit: Payer: Self-pay

## 2021-03-26 ENCOUNTER — Ambulatory Visit (HOSPITAL_COMMUNITY): Payer: Medicare Other | Admitting: Certified Registered Nurse Anesthetist

## 2021-03-26 ENCOUNTER — Encounter (HOSPITAL_COMMUNITY)
Admission: RE | Disposition: A | Discharge status: Home / Self Care | Payer: Self-pay | Source: Home / Self Care | Attending: Internal Medicine

## 2021-03-26 ENCOUNTER — Ambulatory Visit (HOSPITAL_COMMUNITY)
Admission: RE | Admit: 2021-03-26 | Discharge: 2021-03-26 | Disposition: A | Discharge status: Home / Self Care | Payer: Medicare Other | Attending: Internal Medicine | Admitting: Internal Medicine

## 2021-03-26 DIAGNOSIS — D649 Anemia, unspecified: Secondary | ICD-10-CM | POA: Diagnosis not present

## 2021-03-26 DIAGNOSIS — K8050 Calculus of bile duct without cholangitis or cholecystitis without obstruction: Secondary | ICD-10-CM | POA: Diagnosis not present

## 2021-03-26 DIAGNOSIS — I10 Essential (primary) hypertension: Secondary | ICD-10-CM | POA: Insufficient documentation

## 2021-03-26 DIAGNOSIS — K8051 Calculus of bile duct without cholangitis or cholecystitis with obstruction: Secondary | ICD-10-CM | POA: Diagnosis not present

## 2021-03-26 DIAGNOSIS — K8071 Calculus of gallbladder and bile duct without cholecystitis with obstruction: Secondary | ICD-10-CM | POA: Insufficient documentation

## 2021-03-26 HISTORY — PX: ERCP: SHX5425

## 2021-03-26 HISTORY — PX: BILIARY STENT PLACEMENT: SHX5538

## 2021-03-26 HISTORY — PX: REMOVAL OF STONES: SHX5545

## 2021-03-26 HISTORY — PX: GASTROINTESTINAL STENT REMOVAL: SHX6384

## 2021-03-26 LAB — COMPREHENSIVE METABOLIC PANEL
ALT: 38 U/L (ref 0–44)
AST: 44 U/L — ABNORMAL HIGH (ref 15–41)
Albumin: 3.4 g/dL — ABNORMAL LOW (ref 3.5–5.0)
Alkaline Phosphatase: 255 U/L — ABNORMAL HIGH (ref 38–126)
Anion gap: 7 (ref 5–15)
BUN: 15 mg/dL (ref 8–23)
CO2: 24 mmol/L (ref 22–32)
Calcium: 9 mg/dL (ref 8.9–10.3)
Chloride: 106 mmol/L (ref 98–111)
Creatinine, Ser: 0.67 mg/dL (ref 0.61–1.24)
GFR, Estimated: >60 mL/min (ref >60)
Glucose, Bld: 99 mg/dL (ref 70–99)
Potassium: 3.9 mmol/L (ref 3.5–5.1)
Sodium: 137 mmol/L (ref 135–145)
Total Bilirubin: 0.9 mg/dL (ref 0.3–1.2)
Total Protein: 7.5 g/dL (ref 6.5–8.1)

## 2021-03-26 LAB — CBC
HCT: 37.1 % — ABNORMAL LOW (ref 39.0–52.0)
Hemoglobin: 12.5 g/dL — ABNORMAL LOW (ref 13.0–17.0)
MCH: 34.9 pg — ABNORMAL HIGH (ref 26.0–34.0)
MCHC: 33.7 g/dL (ref 30.0–36.0)
MCV: 103.6 fL — ABNORMAL HIGH (ref 80.0–100.0)
Platelets: 183 10*3/uL (ref 150–400)
RBC: 3.58 MIL/uL — ABNORMAL LOW (ref 4.22–5.81)
RDW: 15.6 % — ABNORMAL HIGH (ref 11.5–15.5)
WBC: 6.2 10*3/uL (ref 4.0–10.5)
nRBC: 0 % (ref 0.0–0.2)

## 2021-03-26 LAB — GLUCOSE, CAPILLARY: Glucose-Capillary: 123 mg/dL — ABNORMAL HIGH (ref 70–99)

## 2021-03-26 SURGERY — ERCP, WITH INTERVENTION IF INDICATED
Anesthesia: General | Site: Abdomen

## 2021-03-26 MED ORDER — GLYCOPYRROLATE PF 0.2 MG/ML IJ SOSY
PREFILLED_SYRINGE | INTRAMUSCULAR | Status: DC | PRN
  Administered 2021-03-26: .1 mg via INTRAVENOUS

## 2021-03-26 MED ORDER — CHLORHEXIDINE GLUCONATE 0.12 % MT SOLN
15.0000 mL | Freq: Once | OROMUCOSAL | Status: AC
  Administered 2021-03-26: 15 mL via OROMUCOSAL
  Filled 2021-03-26: qty 15

## 2021-03-26 MED ORDER — LIDOCAINE 2% (20 MG/ML) 5 ML SYRINGE
INTRAMUSCULAR | Status: DC | PRN
  Administered 2021-03-26: 100 mg via INTRAVENOUS

## 2021-03-26 MED ORDER — PROPOFOL 10 MG/ML IV BOLUS
INTRAVENOUS | Status: DC | PRN
  Administered 2021-03-26: 150 mg via INTRAVENOUS

## 2021-03-26 MED ORDER — CEFAZOLIN SODIUM-DEXTROSE 2-4 GM/100ML-% IV SOLN
2.0000 g | INTRAVENOUS | Status: AC
  Administered 2021-03-26: 2 g via INTRAVENOUS
  Filled 2021-03-26 (×2): qty 100

## 2021-03-26 MED ORDER — ONDANSETRON HCL 4 MG/2ML IJ SOLN
INTRAMUSCULAR | Status: AC
  Filled 2021-03-26: qty 2

## 2021-03-26 MED ORDER — FENTANYL CITRATE (PF) 100 MCG/2ML IJ SOLN
INTRAMUSCULAR | Status: AC
  Filled 2021-03-26: qty 2

## 2021-03-26 MED ORDER — PHENYLEPHRINE 40 MCG/ML (10ML) SYRINGE FOR IV PUSH (FOR BLOOD PRESSURE SUPPORT)
PREFILLED_SYRINGE | INTRAVENOUS | Status: DC | PRN
  Administered 2021-03-26: 80 ug via INTRAVENOUS

## 2021-03-26 MED ORDER — DEXAMETHASONE SODIUM PHOSPHATE 10 MG/ML IJ SOLN
INTRAMUSCULAR | Status: AC
  Filled 2021-03-26: qty 1

## 2021-03-26 MED ORDER — PROPOFOL 10 MG/ML IV BOLUS
INTRAVENOUS | Status: AC
  Filled 2021-03-26: qty 20

## 2021-03-26 MED ORDER — GLUCAGON HCL RDNA (DIAGNOSTIC) 1 MG IJ SOLR
INTRAMUSCULAR | Status: AC
  Filled 2021-03-26: qty 1

## 2021-03-26 MED ORDER — GLYCOPYRROLATE PF 0.2 MG/ML IJ SOSY
PREFILLED_SYRINGE | INTRAMUSCULAR | Status: AC
  Filled 2021-03-26: qty 1

## 2021-03-26 MED ORDER — LACTATED RINGERS IV SOLN: INTRAVENOUS | Status: DC

## 2021-03-26 MED ORDER — LIDOCAINE HCL (PF) 2 % IJ SOLN
INTRAMUSCULAR | Status: AC
  Filled 2021-03-26: qty 5

## 2021-03-26 MED ORDER — ORAL CARE MOUTH RINSE: 15.0000 mL | Freq: Once | OROMUCOSAL | Status: AC

## 2021-03-26 MED ORDER — FENTANYL CITRATE (PF) 100 MCG/2ML IJ SOLN
INTRAMUSCULAR | Status: DC | PRN
  Administered 2021-03-26 (×2): 50 ug via INTRAVENOUS

## 2021-03-26 MED ORDER — DEXAMETHASONE SODIUM PHOSPHATE 10 MG/ML IJ SOLN
INTRAMUSCULAR | Status: DC | PRN
  Administered 2021-03-26: 4 mg via INTRAVENOUS

## 2021-03-26 MED ORDER — ROCURONIUM BROMIDE 10 MG/ML (PF) SYRINGE
PREFILLED_SYRINGE | INTRAVENOUS | Status: DC | PRN
  Administered 2021-03-26: 60 mg via INTRAVENOUS

## 2021-03-26 MED ORDER — SUGAMMADEX SODIUM 200 MG/2ML IV SOLN
INTRAVENOUS | Status: DC | PRN
  Administered 2021-03-26: 200 mg via INTRAVENOUS

## 2021-03-26 MED ORDER — SODIUM CHLORIDE 0.9 % IV SOLN
INTRAVENOUS | Status: DC | PRN
  Administered 2021-03-26: 30 mL

## 2021-03-26 MED ORDER — ONDANSETRON HCL 4 MG/2ML IJ SOLN: 4.0000 mg | Freq: Once | INTRAMUSCULAR | Status: DC | PRN

## 2021-03-26 MED ORDER — GLUCAGON HCL RDNA (DIAGNOSTIC) 1 MG IJ SOLR
INTRAMUSCULAR | Status: DC | PRN
  Administered 2021-03-26 (×2): .25 mg via INTRAVENOUS

## 2021-03-26 MED ORDER — ROCURONIUM BROMIDE 10 MG/ML (PF) SYRINGE
PREFILLED_SYRINGE | INTRAVENOUS | Status: AC
  Filled 2021-03-26: qty 10

## 2021-03-26 MED ORDER — STERILE WATER FOR IRRIGATION IR SOLN
Status: DC | PRN
  Administered 2021-03-26: 5 mL

## 2021-03-26 MED ORDER — FENTANYL CITRATE PF 50 MCG/ML IJ SOSY: 25.0000 ug | PREFILLED_SYRINGE | INTRAMUSCULAR | Status: DC | PRN

## 2021-03-26 MED ORDER — SODIUM CHLORIDE 0.9 % IV SOLN
INTRAVENOUS | Status: AC
  Filled 2021-03-26: qty 50

## 2021-03-26 MED ORDER — ONDANSETRON HCL 4 MG/2ML IJ SOLN
INTRAMUSCULAR | Status: DC | PRN
  Administered 2021-03-26: 4 mg via INTRAVENOUS

## 2021-03-26 SURGICAL SUPPLY — 25 items
BALLN RETRIEVAL 12X15 (BALLOONS) IMPLANT
BALN RTRVL 200 6-7FR 12-15 (BALLOONS)
BASKET TRAPEZOID 3X6 (MISCELLANEOUS) IMPLANT
BASKET TRAPEZOID LITHO 2.0X5 (MISCELLANEOUS) ×1 IMPLANT
BSKT STON RTRVL TRAPEZOID 2X5 (MISCELLANEOUS)
BSKT STON RTRVL TRAPEZOID 3X6 (MISCELLANEOUS)
DEVICE INFLATION ENCORE 26 (MISCELLANEOUS) IMPLANT
DEVICE LOCKING W-BIOPSY CAP (MISCELLANEOUS) ×1 IMPLANT
GUIDEWIRE HYDRA JAGWIRE .35 (WIRE) IMPLANT
GUIDEWIRE JAG HINI 025X260CM (WIRE) IMPLANT
KIT ENDO PROCEDURE PEN (KITS) ×2 IMPLANT
KIT TURNOVER KIT A (KITS) ×2 IMPLANT
LUBRICANT JELLY 4.5OZ STERILE (MISCELLANEOUS) IMPLANT
PAD ARMBOARD 7.5X6 YLW CONV (MISCELLANEOUS) ×2 IMPLANT
POSITIONER HEAD 8X9X4 ADT (SOFTGOODS) IMPLANT
SCOPE SPY DS DISPOSABLE (MISCELLANEOUS) ×1 IMPLANT
SNARE ROTATE MED OVAL 20MM (MISCELLANEOUS) IMPLANT
SNARE SHORT THROW 13M SML OVAL (MISCELLANEOUS) IMPLANT
SPHINCTEROTOME AUTOTOME .25 (MISCELLANEOUS) IMPLANT
SPHINCTEROTOME HYDRATOME 44 (MISCELLANEOUS) ×1 IMPLANT
SYSTEM CONTINUOUS INJECTION (MISCELLANEOUS) ×1 IMPLANT
TUBING INSUFFLATOR CO2MPACT (TUBING) ×2 IMPLANT
WALLSTENT METAL COVERED 10X60 (STENTS) IMPLANT
WALLSTENT METAL COVERED 10X80 (STENTS) IMPLANT
WATER STERILE IRR 1000ML POUR (IV SOLUTION) ×2 IMPLANT

## 2021-03-26 NOTE — Op Note (Addendum)
[facility] Patient Name: Dean Randolph Procedure Date: 03/26/2021 8:20 AM MRN: [id number] Date of Birth: July 12, 1940 Attending MD: [name] , MD CSN: [id number] Age: 80 Admit Type: Outpatient Procedure:                ERCP Indications:              Bile duct stone(s) Providers:                [name], MD, Nena Polio, RN, Burke Keels,                            Technician, Kristine L. Jessee Avers, Technician Referring MD:             Mechele Claude, MD Medicines:                General Anesthesia Complications:            No immediate complications. Estimated Blood Loss:     Estimated blood loss: none. Procedure:                Pre-Anesthesia Assessment:                           - Prior to the procedure, a History and Physical                            was performed, and patient medications and                            allergies were reviewed. The patient's tolerance of                            previous anesthesia was also reviewed. The risks                            and benefits of the procedure and the sedation                            options and risks were discussed with the patient.                            All questions were answered, and informed consent                            was obtained. Prior Anticoagulants: The patient has                            taken no previous anticoagulant or antiplatelet                            agents. ASA Grade Assessment: II - A patient with                            mild systemic disease. After reviewing the risks  and benefits, the patient was deemed in                            satisfactory condition to undergo the procedure.                           After obtaining informed consent, the scope was                            passed under direct vision. Throughout the                            procedure, the patient's blood pressure, pulse, and                            oxygen  saturations were monitored continuously. The                            [employer] D single use                            duodenoscope was introduced through the mouth, and                            used to inject contrast into and used to inject                            contrast into the bile duct. The ERCP was                            accomplished without difficulty. The patient                            tolerated the procedure well. Scope In: 11:26:50 AM Scope Out: 12:12:36 PM Total Procedure Duration: 0 hours 45 minutes 46 seconds  Findings:      A biliary stent was visible on the scout film. The major papilla was       adjacent to a diverticulum. The bile duct was deeply cannulated with the       Hydratome sphincterotome. Contrast was injected. I personally       interpreted the bile duct images. There was brisk flow of contrast       through the ducts. Image quality was excellent. Contrast extended to the       entire biliary tree. Initially cystic duct was cannulated and contrast       injected. Multiple stones noted in gallbladder and cystic duct. The       common bile duct and common hepatic duct were mildly dilated, with a       stone causing an obstruction. The common hepatic duct contained       localized stenoses. The biliary sphincterotomy was extended with a       braided Hydratome sphincterotome using ERBE electrocautery. There was no       post-sphincterotomy bleeding. To discover objects, the biliary tree was       swept with a 9 mm balloon  and basket starting at the bifurcation. Sludge       was swept from the duct. A few stones were removed. One stone remained.       One 10 Fr by 9 cm plastic stent with a single external flap and a single       internal flap was placed 8.5 cm into the common bile duct. Bile and       clear fluid flowed through the stent. The stent was in good position. Impression:               - The major papilla was adjacent  to two                            diverticulum.                           - Localized biliary stricture were found at distal                            common hepatic duct. The strictures were associated                            with Mirizzi syndrome.                           - The common bile duct and common hepatic duct were                            mildly dilated containing multiple stones..                           - Patent cystic duct with multiple gallstones some                            of which appear to be in neck of gallbladder.                           - A biliary sphincterotomy was extended.                           - The biliary tree was swept With Dormate basket                            and stone balloon extractor with removal of                            multiple stones. Only 1 small stone was left behind                            proximal to stricture.                           - One plastic stent was placed into the common bile  duct. Moderate Sedation:      Per Anesthesia Care Recommendation:           - Discharge patient to home (with spouse).                           - Avoid aspirin and nonsteroidal anti-inflammatory                            medicines for 3 days.                           - Clear liquid diet today; usual diet starting                            tomorrow morning.                           - Surgical consultation to be arranged.                           - Repeat ERCP after cholecystectomy along with                            spyglass examination. Procedure Code(s):        --- Professional ---                           [phone], Endoscopic retrograde                            cholangiopancreatography (ERCP); with placement of                            endoscopic stent into biliary or pancreatic duct,                            including pre- and post-dilation and guide wire                            passage,  when performed, including sphincterotomy,                            when performed, each stent                           [id number], Endoscopic retrograde                            cholangiopancreatography (ERCP); with removal of                            calculi/debris from biliary/pancreatic duct(s) Diagnosis Code(s):        --- Professional ---                           K80.51, Calculus of bile duct without cholangitis  or cholecystitis with obstruction CPT copyright 2019 American Medical Association. All rights reserved. The codes documented in this report are preliminary and upon coder review may  be revised to meet current compliance requirements. [name], MD [name], MD 03/26/2021 1:14:38 PM This report has been signed electronically. Number of Addenda: 0

## 2021-03-26 NOTE — Transfer of Care (Signed)
Immediate Anesthesia Transfer of Care Note  Patient: Dean Randolph  Procedure(s) Performed: ENDOSCOPIC RETROGRADE CHOLANGIOPANCREATOGRAPHY (ERCP) with EXTENDED SPHINCTEROTOMY (Abdomen) GASTROINTESTINAL STENT REMOVAL (Abdomen) REMOVAL OF STONES WITH EXTRACTION BASKET (Abdomen) BILIARY STENT PLACEMENT 10 FRENCH BY 9cm (Abdomen)  Patient Location: PACU  Anesthesia Type:General  Level of Consciousness: awake  Airway & Oxygen Therapy: Patient Spontanous Breathing and Patient connected to face mask oxygen  Post-op Assessment: Report given to RN and Post -op Vital signs reviewed and stable  Post vital signs: Reviewed and stable  Last Vitals:  Vitals Value Taken Time  BP    Temp    Pulse 74 03/26/21 1226  Resp    SpO2 100 % 03/26/21 1226  Vitals shown include unvalidated device data.  Last Pain:  Vitals:   03/26/21 0952  TempSrc: Oral         Complications: No notable events documented.

## 2021-03-26 NOTE — Anesthesia Procedure Notes (Signed)
Procedure Name: Intubation Date/Time: 03/26/2021 11:11 AM Performed by: Julian Reil, CRNA Pre-anesthesia Checklist: Patient identified, Emergency Drugs available, Suction available and Patient being monitored Patient Re-evaluated:Patient Re-evaluated prior to induction Oxygen Delivery Method: Circle system utilized Preoxygenation: Pre-oxygenation with 100% oxygen Induction Type: IV induction Ventilation: Mask ventilation without difficulty Laryngoscope Size: Miller and 3 Grade View: Grade II Tube type: Oral Tube size: 7.5 mm Number of attempts: 1 Airway Equipment and Method: Stylet Placement Confirmation: ETT inserted through vocal cords under direct vision, positive ETCO2 and breath sounds checked- equal and bilateral Secured at: 23 cm Tube secured with: Tape Dental Injury: Teeth and Oropharynx as per pre-operative assessment

## 2021-03-26 NOTE — Progress Notes (Signed)
Brief ERCP note   biliary stent in place.  The stent was removed.   Stent was examined and noted to be partially occluded.  periampullary diverticula.    patent cystic duct with multiple stones in the gallbladder and possibly neck of the gallbladder.   Cholangiogram revealed mildly dilated CBD and CHD with few filling defects  and stricture at the junction of CHD and CBD secondary to oblong filling defect felt to be stone in cystic duct i.e. Mirizzi syndrome.   Biliary sphincterotomy was extended.   Multiple  stones were removed with Damia basket and balloon stone extractor.   Small stone left behind and common hepatic duct.   10 French 9 cm plastic biliary stent placed for biliary decompression.   PD was not cannulated or filled with contrast.   Patient tolerated the procedure well.

## 2021-03-26 NOTE — Discharge Instructions (Signed)
No aspirin  for 3 days.   Resume usual medications as before  clear liquids today and usual diet starting tomorrow morning.   No driving for 24 hours.   Surgical consultation with Dr. Lovell Sheehan to be arranged.  Their office will contact you directly.

## 2021-03-26 NOTE — Anesthesia Preprocedure Evaluation (Signed)
Anesthesia Evaluation  Patient identified by MRN, date of birth, ID band Patient awake    Reviewed: Allergy & Precautions, NPO status , Patient's Chart, lab work & pertinent test results  Airway Mallampati: II  TM Distance: >3 FB Neck ROM: Full    Dental  (+) Dental Advisory Given, Chipped, Missing   Pulmonary neg pulmonary ROS,    Pulmonary exam normal breath sounds clear to auscultation       Cardiovascular Exercise Tolerance: Good hypertension, Pt. on medications Normal cardiovascular exam Rhythm:Regular Rate:Normal     Neuro/Psych negative neurological ROS  negative psych ROS   GI/Hepatic negative GI ROS, Neg liver ROS,   Endo/Other  negative endocrine ROS  Renal/GU negative Renal ROS     Musculoskeletal negative musculoskeletal ROS (+)   Abdominal   Peds  Hematology  (+) anemia ,   Anesthesia Other Findings   Reproductive/Obstetrics negative OB ROS                             Anesthesia Physical Anesthesia Plan  ASA: 2  Anesthesia Plan: General   Post-op Pain Management:    Induction: Intravenous  PONV Risk Score and Plan: 3 and Ondansetron and Dexamethasone  Airway Management Planned: Oral ETT  Additional Equipment:   Intra-op Plan:   Post-operative Plan: Extubation in OR  Informed Consent: I have reviewed the patients History and Physical, chart, labs and discussed the procedure including the risks, benefits and alternatives for the proposed anesthesia with the patient or authorized representative who has indicated his/her understanding and acceptance.     Dental advisory given  Plan Discussed with: CRNA and Surgeon  Anesthesia Plan Comments:         Anesthesia Quick Evaluation

## 2021-03-26 NOTE — Anesthesia Postprocedure Evaluation (Signed)
Anesthesia Post Note  Patient: TORRY ISTRE  Procedure(s) Performed: ENDOSCOPIC RETROGRADE CHOLANGIOPANCREATOGRAPHY (ERCP) with EXTENDED SPHINCTEROTOMY (Abdomen) GASTROINTESTINAL STENT REMOVAL (Abdomen) REMOVAL OF STONES WITH EXTRACTION BASKET (Abdomen) BILIARY STENT PLACEMENT 10 FRENCH BY 9cm (Abdomen)  Patient location during evaluation: PACU Anesthesia Type: General Level of consciousness: awake and alert and oriented Pain management: pain level controlled Vital Signs Assessment: post-procedure vital signs reviewed and stable Respiratory status: spontaneous breathing and respiratory function stable Cardiovascular status: blood pressure returned to baseline and stable Postop Assessment: no apparent nausea or vomiting Anesthetic complications: no   No notable events documented.   Last Vitals:  Vitals:   03/26/21 1245 03/26/21 1300  BP: (!) 127/52 123/80  Pulse: 63 66  Resp: 11 11  Temp:    SpO2: 100% 100%    Last Pain:  Vitals:   03/26/21 1305  TempSrc:   PainSc: 0-No pain                 Suleika Donavan C Andrena Margerum

## 2021-03-26 NOTE — H&P (Signed)
Dean Randolph is an 80 y.o. male.   Chief Complaint:  patient is here for ERCP with stent and stone removal. HPI:   Patient is 80 year old Caucasian male who was admitted to this facility about 7 weeks ago with jaundice.  He was found to have choledocholithiasis.  ERCP revealed multiple stones and there were immobile.  They could not be removed.  Therefore stent was left in place.  3 days later he was hospitalized for 3 days for pancreatitis.  He now returns for repeat ERCP to remove stent and stones.  If all the stones cannot be removed biliary stent would be placed again.   Patient says he has been feeling well.  He has had good appetite.  He has not had any abdominal pain nausea vomiting or chest pain.  He has gained 5 or 6 pounds since he was last seen.   He does not take aspirin or NSAIDs.  Past Medical History:  Diagnosis Date   Hypertension     Past Surgical History:  Procedure Laterality Date   BILIARY STENT PLACEMENT N/A 02/06/2021   Procedure: BILIARY STENT PLACEMENT;  Surgeon: Malissa Hippo, MD;  Location: [facility];  Service: Gastroenterology;  Laterality: N/A;   CATARACT EXTRACTION Bilateral    ERCP N/A 02/06/2021   Procedure: ENDOSCOPIC RETROGRADE CHOLANGIOPANCREATOGRAPHY (ERCP);  Surgeon: Malissa Hippo, MD;  Location: [facility];  Service: Gastroenterology;  Laterality: N/A;   SPHINCTEROTOMY N/A 02/06/2021   Procedure: SPHINCTEROTOMY;  Surgeon: Malissa Hippo, MD;  Location: [facility];  Service: Gastroenterology;  Laterality: N/A;    History reviewed. No pertinent family history. Social History:  reports that he has never smoked. He has been exposed to tobacco smoke. He has never used smokeless tobacco. He reports that he does not drink alcohol and does not use drugs.  Allergies: No Known Allergies  Medications Prior to Admission  Medication Sig Dispense Refill   amLODipine (NORVASC) 5 MG tablet Take 1 tablet (5 mg total) by mouth daily. (Patient not taking: Reported on  03/21/2021) 30 tablet 1    No results found for this or any previous visit (from the past 48 hour(s)). No results found.  Review of Systems  Blood pressure (!) 161/72, pulse 70, temperature 97.7 F (36.5 C), temperature source Oral, resp. rate 19, height 5\' 6"  (1.676 m), weight 72.6 kg, SpO2 100 %. Physical Exam HENT:     Mouth/Throat:     Mouth: Mucous membranes are moist.     Pharynx: Oropharynx is clear.  Eyes:     General: No scleral icterus.    Conjunctiva/sclera: Conjunctivae normal.  Cardiovascular:     Rate and Rhythm: Normal rate and regular rhythm.     Heart sounds: Normal heart sounds. No murmur heard. Pulmonary:     Effort: Pulmonary effort is normal.     Breath sounds: Normal breath sounds.  Abdominal:     General: There is no distension.     Palpations: Abdomen is soft. There is no mass.     Tenderness: There is no abdominal tenderness.  Musculoskeletal:        General: No swelling.     Cervical back: Neck supple.  Lymphadenopathy:     Cervical: No cervical adenopathy.  Skin:    General: Skin is warm and dry.  Neurological:     Mental Status: He is alert.     Assessment/Plan    Choledocholithiasis.   Patient also has biliary stent in place.   ERCP with  stent and stone removal.   patient has multiple stones.  If all the stones cannot be removed today he will have to be restented.   Procedure is reviewed with patient and he is agreeable.   Lab from this morning is pending.  [name], MD 03/26/2021, 10:58 AM

## 2021-04-01 ENCOUNTER — Encounter (HOSPITAL_COMMUNITY): Payer: Self-pay | Admitting: Internal Medicine

## 2021-04-15 ENCOUNTER — Encounter: Payer: Self-pay | Admitting: General Surgery

## 2021-04-15 ENCOUNTER — Ambulatory Visit: Payer: Medicare Other | Admitting: General Surgery

## 2021-04-15 ENCOUNTER — Other Ambulatory Visit: Payer: Self-pay

## 2021-04-15 VITALS — BP 152/85 | HR 72 | Temp 98.6°F | Resp 12 | Ht 66.0 in | Wt 153.0 lb

## 2021-04-15 DIAGNOSIS — K8020 Calculus of gallbladder without cholecystitis without obstruction: Secondary | ICD-10-CM | POA: Diagnosis not present

## 2021-04-15 NOTE — H&P (Signed)
Dean Randolph; [id number]; [birth year]   HPI Patient is a 80 year old white male who was referred to my care by Dr. Karilyn Cota of gastroenterology for evaluation and treatment of cholelithiasis.  Patient has had 2 ERCPs with stent placement recently for choledocholithiasis.  He also has cholelithiasis.  He currently has a stent in place as not all the stones could be removed.  In discussion with Dr. Karilyn Cota, the patient needs an elective cholecystectomy to prevent any further stones traveling into the common bile duct.  Patient is currently asymptomatic.  He denies any right upper quadrant abdominal pain, nausea, vomiting.  He has tolerated the ERCPs well. Past Medical History:  Diagnosis Date   Hypertension     Past Surgical History:  Procedure Laterality Date   BILIARY STENT PLACEMENT N/A 02/06/2021   Procedure: BILIARY STENT PLACEMENT;  Surgeon: Malissa Hippo, MD;  Location: [facility];  Service: Gastroenterology;  Laterality: N/A;   BILIARY STENT PLACEMENT N/A 03/26/2021   Procedure: BILIARY STENT PLACEMENT 10 FRENCH BY 9cm;  Surgeon: Malissa Hippo, MD;  Location: [facility];  Service: Endoscopy;  Laterality: N/A;   CATARACT EXTRACTION Bilateral    ERCP N/A 02/06/2021   Procedure: ENDOSCOPIC RETROGRADE CHOLANGIOPANCREATOGRAPHY (ERCP);  Surgeon: Malissa Hippo, MD;  Location: [facility];  Service: Gastroenterology;  Laterality: N/A;   ERCP N/A 03/26/2021   Procedure: ENDOSCOPIC RETROGRADE CHOLANGIOPANCREATOGRAPHY (ERCP) with EXTENDED SPHINCTEROTOMY;  Surgeon: Malissa Hippo, MD;  Location: [facility];  Service: Endoscopy;  Laterality: N/A;   GASTROINTESTINAL STENT REMOVAL N/A 03/26/2021   Procedure: GASTROINTESTINAL STENT REMOVAL;  Surgeon: Malissa Hippo, MD;  Location: [facility];  Service: Endoscopy;  Laterality: N/A;   REMOVAL OF STONES N/A 03/26/2021   Procedure: REMOVAL OF STONES WITH EXTRACTION BASKET;  Surgeon: Malissa Hippo, MD;  Location: [facility];  Service: Endoscopy;  Laterality: N/A;    SPHINCTEROTOMY N/A 02/06/2021   Procedure: SPHINCTEROTOMY;  Surgeon: Malissa Hippo, MD;  Location: [facility];  Service: Gastroenterology;  Laterality: N/A;    History reviewed. No pertinent family history.  No current outpatient medications on file prior to visit.   No current facility-administered medications on file prior to visit.    No Known Allergies  Social History   Substance and Sexual Activity  Alcohol Use Never    Social History   Tobacco Use  Smoking Status Never   Passive exposure: Past  Smokeless Tobacco Never    Review of Systems  Constitutional: Negative.   HENT:  Positive for sinus pain.   Eyes: Negative.   Respiratory: Negative.    Cardiovascular: Negative.   Gastrointestinal: Negative.   Genitourinary: Negative.   Musculoskeletal: Negative.   Skin: Negative.   Neurological: Negative.   Endo/Heme/Allergies: Negative.   Psychiatric/Behavioral: Negative.     Objective   Vitals:   04/15/21 1326  BP: (!) 152/85  Pulse: 72  Resp: 12  Temp: 98.6 F (37 C)  SpO2: 98%    Physical Exam Vitals reviewed.  Constitutional:      Appearance: Normal appearance. He is normal weight. He is not ill-appearing.  HENT:     Head: Normocephalic and atraumatic.  Eyes:     General: No scleral icterus. Cardiovascular:     Rate and Rhythm: Normal rate and regular rhythm.     Heart sounds: Normal heart sounds. No murmur heard.   No friction rub. No gallop.  Pulmonary:     Effort: Pulmonary effort is normal. No respiratory distress.     Breath  sounds: Normal breath sounds. No stridor. No wheezing, rhonchi or rales.  Abdominal:     General: Bowel sounds are normal. There is no distension.     Palpations: Abdomen is soft. There is no mass.     Tenderness: There is no abdominal tenderness. There is no guarding or rebound.     Hernia: No hernia is present.  Skin:    General: Skin is warm and dry.  Neurological:     Mental Status: He is alert and oriented to  person, place, and time.   Dr. Patty Sermons notes were reviewed Assessment  Cholelithiasis, choledocholithiasis, status post ERCP with stent placement x2 Plan  We will proceed with laparoscopic cholecystectomy on 04/25/2021.  The risks and benefits of the procedure including bleeding, infection, hepatobiliary injury, and the possibility of an open procedure were fully explained to the patient, who gave informed consent.

## 2021-04-15 NOTE — Progress Notes (Signed)
Dean Randolph; [id number]; [birth year]   HPI Patient is a 80 year old white male who was referred to my care by Dr. Karilyn Cota of gastroenterology for evaluation and treatment of cholelithiasis.  Patient has had 2 ERCPs with stent placement recently for choledocholithiasis.  He also has cholelithiasis.  He currently has a stent in place as not all the stones could be removed.  In discussion with Dr. Karilyn Cota, the patient needs an elective cholecystectomy to prevent any further stones traveling into the common bile duct.  Patient is currently asymptomatic.  He denies any right upper quadrant abdominal pain, nausea, vomiting.  He has tolerated the ERCPs well. Past Medical History:  Diagnosis Date   Hypertension     Past Surgical History:  Procedure Laterality Date   BILIARY STENT PLACEMENT N/A 02/06/2021   Procedure: BILIARY STENT PLACEMENT;  Surgeon: Malissa Hippo, MD;  Location: [facility];  Service: Gastroenterology;  Laterality: N/A;   BILIARY STENT PLACEMENT N/A 03/26/2021   Procedure: BILIARY STENT PLACEMENT 10 FRENCH BY 9cm;  Surgeon: Malissa Hippo, MD;  Location: [facility];  Service: Endoscopy;  Laterality: N/A;   CATARACT EXTRACTION Bilateral    ERCP N/A 02/06/2021   Procedure: ENDOSCOPIC RETROGRADE CHOLANGIOPANCREATOGRAPHY (ERCP);  Surgeon: Malissa Hippo, MD;  Location: [facility];  Service: Gastroenterology;  Laterality: N/A;   ERCP N/A 03/26/2021   Procedure: ENDOSCOPIC RETROGRADE CHOLANGIOPANCREATOGRAPHY (ERCP) with EXTENDED SPHINCTEROTOMY;  Surgeon: Malissa Hippo, MD;  Location: [facility];  Service: Endoscopy;  Laterality: N/A;   GASTROINTESTINAL STENT REMOVAL N/A 03/26/2021   Procedure: GASTROINTESTINAL STENT REMOVAL;  Surgeon: Malissa Hippo, MD;  Location: [facility];  Service: Endoscopy;  Laterality: N/A;   REMOVAL OF STONES N/A 03/26/2021   Procedure: REMOVAL OF STONES WITH EXTRACTION BASKET;  Surgeon: Malissa Hippo, MD;  Location: [facility];  Service: Endoscopy;  Laterality: N/A;    SPHINCTEROTOMY N/A 02/06/2021   Procedure: SPHINCTEROTOMY;  Surgeon: Malissa Hippo, MD;  Location: [facility];  Service: Gastroenterology;  Laterality: N/A;    History reviewed. No pertinent family history.  No current outpatient medications on file prior to visit.   No current facility-administered medications on file prior to visit.    No Known Allergies  Social History   Substance and Sexual Activity  Alcohol Use Never    Social History   Tobacco Use  Smoking Status Never   Passive exposure: Past  Smokeless Tobacco Never    Review of Systems  Constitutional: Negative.   HENT:  Positive for sinus pain.   Eyes: Negative.   Respiratory: Negative.    Cardiovascular: Negative.   Gastrointestinal: Negative.   Genitourinary: Negative.   Musculoskeletal: Negative.   Skin: Negative.   Neurological: Negative.   Endo/Heme/Allergies: Negative.   Psychiatric/Behavioral: Negative.     Objective   Vitals:   04/15/21 1326  BP: (!) 152/85  Pulse: 72  Resp: 12  Temp: 98.6 F (37 C)  SpO2: 98%    Physical Exam Vitals reviewed.  Constitutional:      Appearance: Normal appearance. He is normal weight. He is not ill-appearing.  HENT:     Head: Normocephalic and atraumatic.  Eyes:     General: No scleral icterus. Cardiovascular:     Rate and Rhythm: Normal rate and regular rhythm.     Heart sounds: Normal heart sounds. No murmur heard.   No friction rub. No gallop.  Pulmonary:     Effort: Pulmonary effort is normal. No respiratory distress.     Breath  sounds: Normal breath sounds. No stridor. No wheezing, rhonchi or rales.  Abdominal:     General: Bowel sounds are normal. There is no distension.     Palpations: Abdomen is soft. There is no mass.     Tenderness: There is no abdominal tenderness. There is no guarding or rebound.     Hernia: No hernia is present.  Skin:    General: Skin is warm and dry.  Neurological:     Mental Status: He is alert and oriented to  person, place, and time.   Dr. Patty Sermons notes were reviewed Assessment  Cholelithiasis, choledocholithiasis, status post ERCP with stent placement x2 Plan  We will proceed with laparoscopic cholecystectomy on 04/25/2021.  The risks and benefits of the procedure including bleeding, infection, hepatobiliary injury, and the possibility of an open procedure were fully explained to the patient, who gave informed consent.

## 2021-04-22 NOTE — Patient Instructions (Signed)
Dean Randolph  04/22/2021     @PREFPERIOPPHARMACY @   Your procedure is scheduled on 04/25/2021.   Report to [facility] at 0900 A.M.   Call this number if you have problems the morning of surgery:  [phone]   Remember:  Do not eat or drink after midnight.      Take these medicines the morning of surgery with A SIP OF WATER                                   None    Do not wear jewelry, make-up or nail polish.  Do not wear lotions, powders, or perfumes, or deodorant.  Do not shave 48 hours prior to surgery.  Men may shave face and neck.  Do not bring valuables to the hospital.  [facility] is not responsible for any belongings or valuables.  Contacts, dentures or bridgework may not be worn into surgery.  Leave your suitcase in the car.  After surgery it may be brought to your room.  For patients admitted to the hospital, discharge time will be determined by your treatment team.  Patients discharged the day of surgery will not be allowed to drive home and must have someone with them for 24 hours.    Special instructions:   DO NOT smoke tobacco or vape for 24 hours before your procedure.  Please read over the following fact sheets that you were given. Anesthesia Post-op Instructions and Care and Recovery After Surgery      Minimally Invasive Cholecystectomy, Care After This sheet gives you information about how to care for yourself after your procedure. Your health care provider may also give you more specific instructions. If you have problems or questions, contact your health care provider. What can I expect after the procedure? After the procedure, it is common to have: Pain at your incision sites. You will be given medicines to control this pain. Mild nausea or vomiting. Bloating and possible shoulder pain from the gas that was used during the procedure. Follow these instructions at home: Medicines Take over-the-counter and prescription medicines  only as told by your health care provider. If you were prescribed an antibiotic medicine, take or use it as told by your health care provider. Do not stop using the antibiotic even if you start to feel better. Ask your health care provider if the medicine prescribed to you: Requires you to avoid driving or using machinery. Can cause constipation. You may need to take these actions to prevent or treat constipation: Drink enough fluid to keep your urine pale yellow. Take over-the-counter or prescription medicines. Eat foods that are high in fiber, such as beans, whole grains, and fresh fruits and vegetables. Limit foods that are high in fat and processed sugars, such as fried or sweet foods. Incision care  Follow instructions from your health care provider about how to take care of your incisions. Make sure you: Wash your hands with soap and water for at least 20 seconds before and after you change your bandage (dressing). If soap and water are not available, use hand sanitizer. Change your dressing as told by your health care provider. Leave stitches (sutures), skin glue, or adhesive strips in place. These skin closures may need to be in place for 2 weeks or longer. If adhesive strip edges start to loosen and curl up, you may trim the  loose edges. Do not remove adhesive strips completely unless your health care provider tells you to do that. Do not take baths, swim, or use a hot tub until your health care provider approves. Ask your health care provider if you may take showers. You may only be allowed to take sponge baths. Check your incision area every day for signs of infection. Check for: More redness, swelling, or pain. Fluid or blood. Warmth. Pus or a bad smell. Activity Rest as told by your health care provider. Avoid sitting for a long time without moving. Get up to take short walks every 1-2 hours. This is important to improve blood flow and breathing. Ask for help if you feel weak or  unsteady. Do not lift anything that is heavier than 10 lb (4.5 kg), or the limit that you are told, until your health care provider says that it is safe. Do not play contact sports until your health care provider approves. Do not return to work or school until your health care provider approves. Return to your normal activities as told by your health care provider. Ask your health care provider what activities are safe for you. General instructions If you were given a sedative during the procedure, it can affect you for several hours. Do not drive or operate machinery until your health care provider says that it is safe. Keep all follow-up visits as told by your health care provider. This is important. Contact a health care provider if: You develop a rash. You have more redness, swelling, or pain around your incisions. You have fluid or blood coming from your incisions. Your incisions feel warm to the touch. You have pus or a bad smell coming from your incisions. You have a fever. One or more of your incisions breaks open. Get help right away if: You have trouble breathing. You have chest pain. You have increasing pain in your shoulders. You faint or feel dizzy when you stand. You have severe pain in your abdomen. You have nausea or vomiting that lasts for more than one day. You have leg pain. Summary After your procedure, it is common to have pain at the incision sites. You may also have nausea or bloating. Follow your health care provider's instructions about medicine, activity restrictions, and caring for your incision areas. Do not do activities that require a lot of effort. Contact a health care provider if you have a fever or other signs of infection, such as more redness, swelling, or pain around the incisions. Get help right away if you have chest pain, increasing pain in the shoulders, or trouble breathing. This information is not intended to replace advice given to you by your  health care provider. Make sure you discuss any questions you have with your health care provider. Document Revised: 03/15/2019 Document Reviewed: 03/15/2019 Elsevier Patient Education  2022 [employer] General Anesthesia, Adult, Care After This sheet gives you information about how to care for yourself after your procedure. Your health care provider may also give you more specific instructions. If you have problems or questions, contact your health care provider. What can I expect after the procedure? After the procedure, the following side effects are common: Pain or discomfort at the IV site. Nausea. Vomiting. Sore throat. Trouble concentrating. Feeling cold or chills. Feeling weak or tired. Sleepiness and fatigue. Soreness and body aches. These side effects can affect parts of the body that were not involved in surgery. Follow these instructions at home: For the time period you  were told by your health care provider:  Rest. Do not participate in activities where you could fall or become injured. Do not drive or use machinery. Do not drink alcohol. Do not take sleeping pills or medicines that cause drowsiness. Do not make important decisions or sign legal documents. Do not take care of children on your own. Eating and drinking Follow any instructions from your health care provider about eating or drinking restrictions. When you feel hungry, start by eating small amounts of foods that are soft and easy to digest (bland), such as toast. Gradually return to your regular diet. Drink enough fluid to keep your urine pale yellow. If you vomit, rehydrate by drinking water, juice, or clear broth. General instructions If you have sleep apnea, surgery and certain medicines can increase your risk for breathing problems. Follow instructions from your health care provider about wearing your sleep device: Anytime you are sleeping, including during daytime naps. While taking prescription pain  medicines, sleeping medicines, or medicines that make you drowsy. Have a responsible adult stay with you for the time you are told. It is important to have someone help care for you until you are awake and alert. Return to your normal activities as told by your health care provider. Ask your health care provider what activities are safe for you. Take over-the-counter and prescription medicines only as told by your health care provider. If you smoke, do not smoke without supervision. Keep all follow-up visits as told by your health care provider. This is important. Contact a health care provider if: You have nausea or vomiting that does not get better with medicine. You cannot eat or drink without vomiting. You have pain that does not get better with medicine. You are unable to pass urine. You develop a skin rash. You have a fever. You have redness around your IV site that gets worse. Get help right away if: You have difficulty breathing. You have chest pain. You have blood in your urine or stool, or you vomit blood. Summary After the procedure, it is common to have a sore throat or nausea. It is also common to feel tired. Have a responsible adult stay with you for the time you are told. It is important to have someone help care for you until you are awake and alert. When you feel hungry, start by eating small amounts of foods that are soft and easy to digest (bland), such as toast. Gradually return to your regular diet. Drink enough fluid to keep your urine pale yellow. Return to your normal activities as told by your health care provider. Ask your health care provider what activities are safe for you. This information is not intended to replace advice given to you by your health care provider. Make sure you discuss any questions you have with your health care provider. Document Revised: 02/29/2020 Document Reviewed: 09/28/2019 Elsevier Patient Education  2022 Else[employer] Inc. How to Use  Chlorhexidine for Bathing Chlorhexidine gluconate (CHG) is a germ-killing (antiseptic) solution that is used to clean the skin. It can get rid of the bacteria that normally live on the skin and can keep them away for about 24 hours. To clean your skin with CHG, you may be given: A CHG solution to use in the shower or as part of a sponge bath. A prepackaged cloth that contains CHG. Cleaning your skin with CHG may help lower the risk for infection: While you are staying in the intensive care unit of the hospital. If you  have a vascular access, such as a central line, to provide short-term or long-term access to your veins. If you have a catheter to drain urine from your bladder. If you are on a ventilator. A ventilator is a machine that helps you breathe by moving air in and out of your lungs. After surgery. What are the risks? Risks of using CHG include: A skin reaction. Hearing loss, if CHG gets in your ears and you have a perforated eardrum. Eye injury, if CHG gets in your eyes and is not rinsed out. The CHG product catching fire. Make sure that you avoid smoking and flames after applying CHG to your skin. Do not use CHG: If you have a chlorhexidine allergy or have previously reacted to chlorhexidine. On babies younger than 64 months of age. How to use CHG solution Use CHG only as told by your health care provider, and follow the instructions on the label. Use the full amount of CHG as directed. Usually, this is one bottle. During a shower Follow these steps when using CHG solution during a shower (unless your health care provider gives you different instructions): Start the shower. Use your normal soap and shampoo to wash your face and hair. Turn off the shower or move out of the shower stream. Pour the CHG onto a clean washcloth. Do not use any type of brush or rough-edged sponge. Starting at your neck, lather your body down to your toes. Make sure you follow these instructions: If  you will be having surgery, pay special attention to the part of your body where you will be having surgery. Scrub this area for at least 1 minute. Do not use CHG on your head or face. If the solution gets into your ears or eyes, rinse them well with water. Avoid your genital area. Avoid any areas of skin that have broken skin, cuts, or scrapes. Scrub your back and under your arms. Make sure to wash skin folds. Let the lather sit on your skin for 1-2 minutes or as long as told by your health care provider. Thoroughly rinse your entire body in the shower. Make sure that all body creases and crevices are rinsed well. Dry off with a clean towel. Do not put any substances on your body afterward--such as powder, lotion, or perfume--unless you are told to do so by your health care provider. Only use lotions that are recommended by the manufacturer. Put on clean clothes or pajamas. If it is the night before your surgery, sleep in clean sheets.  During a sponge bath Follow these steps when using CHG solution during a sponge bath (unless your health care provider gives you different instructions): Use your normal soap and shampoo to wash your face and hair. Pour the CHG onto a clean washcloth. Starting at your neck, lather your body down to your toes. Make sure you follow these instructions: If you will be having surgery, pay special attention to the part of your body where you will be having surgery. Scrub this area for at least 1 minute. Do not use CHG on your head or face. If the solution gets into your ears or eyes, rinse them well with water. Avoid your genital area. Avoid any areas of skin that have broken skin, cuts, or scrapes. Scrub your back and under your arms. Make sure to wash skin folds. Let the lather sit on your skin for 1-2 minutes or as long as told by your health care provider. Using a different clean, wet  washcloth, thoroughly rinse your entire body. Make sure that all body creases  and crevices are rinsed well. Dry off with a clean towel. Do not put any substances on your body afterward--such as powder, lotion, or perfume--unless you are told to do so by your health care provider. Only use lotions that are recommended by the manufacturer. Put on clean clothes or pajamas. If it is the night before your surgery, sleep in clean sheets. How to use CHG prepackaged cloths Only use CHG cloths as told by your health care provider, and follow the instructions on the label. Use the CHG cloth on clean, dry skin. Do not use the CHG cloth on your head or face unless your health care provider tells you to. When washing with the CHG cloth: Avoid your genital area. Avoid any areas of skin that have broken skin, cuts, or scrapes. Before surgery Follow these steps when using a CHG cloth to clean before surgery (unless your health care provider gives you different instructions): Using the CHG cloth, vigorously scrub the part of your body where you will be having surgery. Scrub using a back-and-forth motion for 3 minutes. The area on your body should be completely wet with CHG when you are done scrubbing. Do not rinse. Discard the cloth and let the area air-dry. Do not put any substances on the area afterward, such as powder, lotion, or perfume. Put on clean clothes or pajamas. If it is the night before your surgery, sleep in clean sheets.  For general bathing Follow these steps when using CHG cloths for general bathing (unless your health care provider gives you different instructions). Use a separate CHG cloth for each area of your body. Make sure you wash between any folds of skin and between your fingers and toes. Wash your body in the following order, switching to a new cloth after each step: The front of your neck, shoulders, and chest. Both of your arms, under your arms, and your hands. Your stomach and groin area, avoiding the genitals. Your right leg and foot. Your left leg and  foot. The back of your neck, your back, and your buttocks. Do not rinse. Discard the cloth and let the area air-dry. Do not put any substances on your body afterward--such as powder, lotion, or perfume--unless you are told to do so by your health care provider. Only use lotions that are recommended by the manufacturer. Put on clean clothes or pajamas. Contact a health care provider if: Your skin gets irritated after scrubbing. You have questions about using your solution or cloth. You swallow any chlorhexidine. Call your local poison control center ([phone] in the [place] Get help right away if: Your eyes itch badly, or they become very red or swollen. Your skin itches badly and is red or swollen. Your hearing changes. You have trouble seeing. You have swelling or tingling in your mouth or throat. You have trouble breathing. These symptoms may represent a serious problem that is an emergency. Do not wait to see if the symptoms will go away. Get medical help right away. Call your local emergency services (911 in the [place] Do not drive yourself to the hospital. Summary Chlorhexidine gluconate (CHG) is a germ-killing (antiseptic) solution that is used to clean the skin. Cleaning your skin with CHG may help to lower your risk for infection. You may be given CHG to use for bathing. It may be in a bottle or in a prepackaged cloth to use on your skin. Carefully follow your  health care provider's instructions and the instructions on the product label. Do not use CHG if you have a chlorhexidine allergy. Contact your health care provider if your skin gets irritated after scrubbing. This information is not intended to replace advice given to you by your health care provider. Make sure you discuss any questions you have with your health care provider. Document Revised: 08/26/2020 Document Reviewed: 08/26/2020 Elsevier Patient Education  2022 [employer].

## 2021-04-23 ENCOUNTER — Encounter (HOSPITAL_COMMUNITY)
Admission: RE | Admit: 2021-04-23 | Discharge: 2021-04-23 | Disposition: A | Discharge status: Home / Self Care | Payer: Medicare Other | Source: Ambulatory Visit | Attending: General Surgery | Admitting: General Surgery

## 2021-04-23 ENCOUNTER — Encounter (HOSPITAL_COMMUNITY): Payer: Self-pay

## 2021-04-23 ENCOUNTER — Other Ambulatory Visit: Payer: Self-pay

## 2021-04-23 NOTE — Pre-Procedure Instructions (Signed)
BP of 183/87 reported to Dr Lovell Sheehan. Patient states he hasn't taken hi BP med (amlodipine 5mg ) in over a month but he was discharged on this med from the hospital. "Some doctor told me I didn't need to take it if I don't feel like I need to". He could not remember the name of the doctor who told him this, or his PCP's name. Dr told patient he needs to take med. We discussed importance of this at length. Patient states he will, " go get the medicine right now." Instructed him not to miss any doses prior to surgery. He verbalized understanding.

## 2021-04-25 ENCOUNTER — Ambulatory Visit (HOSPITAL_COMMUNITY): Payer: Medicare Other | Admitting: Certified Registered Nurse Anesthetist

## 2021-04-25 ENCOUNTER — Encounter (HOSPITAL_COMMUNITY)
Admission: RE | Disposition: A | Discharge status: Home / Self Care | Payer: Self-pay | Source: Home / Self Care | Attending: General Surgery

## 2021-04-25 ENCOUNTER — Ambulatory Visit (HOSPITAL_COMMUNITY)
Admission: RE | Admit: 2021-04-25 | Discharge: 2021-04-25 | Disposition: A | Discharge status: Home / Self Care | Payer: Medicare Other | Attending: General Surgery | Admitting: General Surgery

## 2021-04-25 ENCOUNTER — Encounter (HOSPITAL_COMMUNITY): Payer: Self-pay | Admitting: General Surgery

## 2021-04-25 DIAGNOSIS — K8012 Calculus of gallbladder with acute and chronic cholecystitis without obstruction: Secondary | ICD-10-CM | POA: Insufficient documentation

## 2021-04-25 DIAGNOSIS — K8010 Calculus of gallbladder with chronic cholecystitis without obstruction: Secondary | ICD-10-CM | POA: Diagnosis not present

## 2021-04-25 DIAGNOSIS — K8020 Calculus of gallbladder without cholecystitis without obstruction: Secondary | ICD-10-CM | POA: Diagnosis not present

## 2021-04-25 HISTORY — PX: CHOLECYSTECTOMY: SHX55

## 2021-04-25 SURGERY — LAPAROSCOPIC CHOLECYSTECTOMY
Anesthesia: General | Site: Abdomen

## 2021-04-25 MED ORDER — ONDANSETRON HCL 4 MG/2ML IJ SOLN
INTRAMUSCULAR | Status: DC | PRN
  Administered 2021-04-25: 4 mg via INTRAVENOUS

## 2021-04-25 MED ORDER — LIDOCAINE HCL (CARDIAC) PF 100 MG/5ML IV SOSY
PREFILLED_SYRINGE | INTRAVENOUS | Status: DC | PRN
  Administered 2021-04-25: 80 mg via INTRATRACHEAL

## 2021-04-25 MED ORDER — ORAL CARE MOUTH RINSE: 15.0000 mL | Freq: Once | OROMUCOSAL | Status: AC

## 2021-04-25 MED ORDER — CHLORHEXIDINE GLUCONATE CLOTH 2 % EX PADS: 6.0000 | MEDICATED_PAD | Freq: Once | CUTANEOUS | Status: DC

## 2021-04-25 MED ORDER — LACTATED RINGERS IV SOLN: INTRAVENOUS | Status: DC

## 2021-04-25 MED ORDER — EPHEDRINE SULFATE 50 MG/ML IJ SOLN
INTRAMUSCULAR | Status: DC | PRN
  Administered 2021-04-25: 10 mg via INTRAVENOUS

## 2021-04-25 MED ORDER — HYDROMORPHONE HCL 1 MG/ML IJ SOLN: 0.2500 mg | INTRAMUSCULAR | Status: DC | PRN

## 2021-04-25 MED ORDER — HEMOSTATIC AGENTS (NO CHARGE) OPTIME
TOPICAL | Status: DC | PRN
  Administered 2021-04-25 (×2): 1 via TOPICAL

## 2021-04-25 MED ORDER — TRAMADOL HCL 50 MG PO TABS: 50.0000 mg | ORAL_TABLET | Freq: Four times a day (QID) | ORAL | 0 refills | Status: DC | PRN

## 2021-04-25 MED ORDER — SUGAMMADEX SODIUM 200 MG/2ML IV SOLN
INTRAVENOUS | Status: DC | PRN
  Administered 2021-04-25: 200 mg via INTRAVENOUS

## 2021-04-25 MED ORDER — PROPOFOL 10 MG/ML IV BOLUS
INTRAVENOUS | Status: AC
  Filled 2021-04-25: qty 20

## 2021-04-25 MED ORDER — ONDANSETRON HCL 4 MG/2ML IJ SOLN: 4.0000 mg | Freq: Once | INTRAMUSCULAR | Status: DC | PRN

## 2021-04-25 MED ORDER — FENTANYL CITRATE (PF) 250 MCG/5ML IJ SOLN
INTRAMUSCULAR | Status: AC
  Filled 2021-04-25: qty 5

## 2021-04-25 MED ORDER — FENTANYL CITRATE (PF) 250 MCG/5ML IJ SOLN
INTRAMUSCULAR | Status: DC | PRN
  Administered 2021-04-25 (×3): 50 ug via INTRAVENOUS

## 2021-04-25 MED ORDER — PROPOFOL 500 MG/50ML IV EMUL
INTRAVENOUS | Status: DC | PRN
  Administered 2021-04-25: 140 mg via INTRAVENOUS

## 2021-04-25 MED ORDER — PHENYLEPHRINE HCL (PRESSORS) 10 MG/ML IV SOLN
INTRAVENOUS | Status: DC | PRN
  Administered 2021-04-25: 80 ug via INTRAVENOUS
  Administered 2021-04-25: 120 ug via INTRAVENOUS

## 2021-04-25 MED ORDER — LIDOCAINE HCL (PF) 2 % IJ SOLN
INTRAMUSCULAR | Status: AC
  Filled 2021-04-25: qty 5

## 2021-04-25 MED ORDER — DEXAMETHASONE SODIUM PHOSPHATE 10 MG/ML IJ SOLN
INTRAMUSCULAR | Status: AC
  Filled 2021-04-25: qty 1

## 2021-04-25 MED ORDER — ROCURONIUM BROMIDE 100 MG/10ML IV SOLN
INTRAVENOUS | Status: DC | PRN
  Administered 2021-04-25: 50 mg via INTRAVENOUS

## 2021-04-25 MED ORDER — CEFAZOLIN SODIUM-DEXTROSE 2-4 GM/100ML-% IV SOLN
2.0000 g | INTRAVENOUS | Status: DC
  Filled 2021-04-25: qty 100

## 2021-04-25 MED ORDER — ONDANSETRON HCL 4 MG/2ML IJ SOLN
INTRAMUSCULAR | Status: AC
  Filled 2021-04-25: qty 2

## 2021-04-25 MED ORDER — EPHEDRINE 5 MG/ML INJ
INTRAVENOUS | Status: AC
  Filled 2021-04-25: qty 5

## 2021-04-25 MED ORDER — SODIUM CHLORIDE 0.9 % IR SOLN
Status: DC | PRN
  Administered 2021-04-25: 1000 mL
  Administered 2021-04-25: 3000 mL

## 2021-04-25 MED ORDER — CHLORHEXIDINE GLUCONATE 0.12 % MT SOLN
OROMUCOSAL | Status: AC
  Administered 2021-04-25: 15 mL via OROMUCOSAL
  Filled 2021-04-25: qty 15

## 2021-04-25 MED ORDER — DEXAMETHASONE SODIUM PHOSPHATE 10 MG/ML IJ SOLN
INTRAMUSCULAR | Status: DC | PRN
  Administered 2021-04-25: 10 mg via INTRAVENOUS

## 2021-04-25 MED ORDER — BUPIVACAINE LIPOSOME 1.3 % IJ SUSP
INTRAMUSCULAR | Status: DC | PRN
  Administered 2021-04-25: 20 mL

## 2021-04-25 MED ORDER — ROCURONIUM BROMIDE 10 MG/ML (PF) SYRINGE
PREFILLED_SYRINGE | INTRAVENOUS | Status: AC
  Filled 2021-04-25: qty 10

## 2021-04-25 MED ORDER — BUPIVACAINE LIPOSOME 1.3 % IJ SUSP
INTRAMUSCULAR | Status: AC
  Filled 2021-04-25: qty 20

## 2021-04-25 MED ORDER — CHLORHEXIDINE GLUCONATE 0.12 % MT SOLN: 15.0000 mL | Freq: Once | OROMUCOSAL | Status: AC

## 2021-04-25 SURGICAL SUPPLY — 46 items
APPLICATOR ARISTA FLEXITIP XL (MISCELLANEOUS) ×1 IMPLANT
APPLIER CLIP ROT 10 11.4 M/L (STAPLE) ×2
BAG RETRIEVAL 10 (BASKET) ×1
CHLORAPREP W/TINT 26 (MISCELLANEOUS) ×2 IMPLANT
CLIP APPLIE ROT 10 11.4 M/L (STAPLE) ×1 IMPLANT
CLOTH BEACON ORANGE TIMEOUT ST (SAFETY) ×2 IMPLANT
COVER LIGHT HANDLE STERIS (MISCELLANEOUS) ×4 IMPLANT
CUTTER FLEX LINEAR 45M (STAPLE) ×1 IMPLANT
DERMABOND ADVANCED (GAUZE/BANDAGES/DRESSINGS) ×1
DERMABOND ADVANCED .7 DNX12 (GAUZE/BANDAGES/DRESSINGS) ×1 IMPLANT
ELECT REM PT RETURN 9FT ADLT (ELECTROSURGICAL) ×2
ELECTRODE REM PT RTRN 9FT ADLT (ELECTROSURGICAL) ×1 IMPLANT
GAUZE 4X4 16PLY ~~LOC~~+RFID DBL (SPONGE) ×1 IMPLANT
GLOVE SURG LTX SZ7 (GLOVE) ×1 IMPLANT
GLOVE SURG POLYISO LF SZ7.5 (GLOVE) ×2 IMPLANT
GLOVE SURG UNDER POLY LF SZ7 (GLOVE) ×4 IMPLANT
GOWN STRL REUS W/TWL LRG LVL3 (GOWN DISPOSABLE) ×6 IMPLANT
HEMOSTAT ARISTA ABSORB 3G PWDR (HEMOSTASIS) ×1 IMPLANT
HEMOSTAT SNOW SURGICEL 2X4 (HEMOSTASIS) ×2 IMPLANT
INST SET LAPROSCOPIC AP (KITS) ×2 IMPLANT
IV NS IRRIG 3000ML ARTHROMATIC (IV SOLUTION) ×1 IMPLANT
KIT TURNOVER KIT A (KITS) ×2 IMPLANT
MANIFOLD NEPTUNE II (INSTRUMENTS) ×2 IMPLANT
NDL HYPO 21X1.5 SAFETY (NEEDLE) ×1 IMPLANT
NDL INSUFFLATION 14GA 120MM (NEEDLE) ×1 IMPLANT
NEEDLE HYPO 21X1.5 SAFETY (NEEDLE) ×2 IMPLANT
NEEDLE INSUFFLATION 14GA 120MM (NEEDLE) ×2 IMPLANT
NS IRRIG 1000ML POUR BTL (IV SOLUTION) ×2 IMPLANT
PACK LAP CHOLE LZT030E (CUSTOM PROCEDURE TRAY) ×2 IMPLANT
PAD ARMBOARD 7.5X6 YLW CONV (MISCELLANEOUS) ×2 IMPLANT
RELOAD STAPLE 45 3.5 BLU ETS (ENDOMECHANICALS) IMPLANT
RELOAD STAPLE TA45 3.5 REG BLU (ENDOMECHANICALS) ×4 IMPLANT
SET BASIN LINEN APH (SET/KITS/TRAYS/PACK) ×2 IMPLANT
SET TUBE IRRIG SUCTION NO TIP (IRRIGATION / IRRIGATOR) ×1 IMPLANT
SET TUBE SMOKE EVAC HIGH FLOW (TUBING) ×2 IMPLANT
SLEEVE ENDOPATH XCEL 5M (ENDOMECHANICALS) ×2 IMPLANT
SUT MNCRL AB 4-0 PS2 18 (SUTURE) ×4 IMPLANT
SUT VICRYL 0 UR6 27IN ABS (SUTURE) ×2 IMPLANT
SYR 20ML LL LF (SYRINGE) ×3 IMPLANT
SYS BAG RETRIEVAL 10MM (BASKET) ×1
SYSTEM BAG RETRIEVAL 10MM (BASKET) ×1 IMPLANT
TROCAR ENDO BLADELESS 11MM (ENDOMECHANICALS) ×2 IMPLANT
TROCAR XCEL NON-BLD 5MMX100MML (ENDOMECHANICALS) ×2 IMPLANT
TROCAR XCEL UNIV SLVE 11M 100M (ENDOMECHANICALS) ×2 IMPLANT
TUBE CONNECTING 12X1/4 (SUCTIONS) ×2 IMPLANT
WARMER LAPAROSCOPE (MISCELLANEOUS) ×2 IMPLANT

## 2021-04-25 NOTE — Anesthesia Preprocedure Evaluation (Signed)
Anesthesia Evaluation  Patient identified by MRN, date of birth, ID band Patient awake    Reviewed: Allergy & Precautions, NPO status , Patient's Chart, lab work & pertinent test results  Airway Mallampati: II  TM Distance: >3 FB Neck ROM: Full    Dental  (+) Dental Advisory Given, Chipped, Missing,    Pulmonary neg pulmonary ROS,    Pulmonary exam normal breath sounds clear to auscultation       Cardiovascular Exercise Tolerance: Good hypertension, Pt. on medications Normal cardiovascular exam Rhythm:Regular Rate:Normal     Neuro/Psych negative neurological ROS  negative psych ROS   GI/Hepatic negative GI ROS, Neg liver ROS,   Endo/Other  negative endocrine ROS  Renal/GU negative Renal ROS     Musculoskeletal negative musculoskeletal ROS (+)   Abdominal   Peds  Hematology  (+) anemia ,   Anesthesia Other Findings   Reproductive/Obstetrics negative OB ROS                             Anesthesia Physical  Anesthesia Plan  ASA: 2  Anesthesia Plan: General   Post-op Pain Management:    Induction: Intravenous  PONV Risk Score and Plan: 3 and Ondansetron and Dexamethasone  Airway Management Planned: Oral ETT  Additional Equipment:   Intra-op Plan:   Post-operative Plan: Extubation in OR  Informed Consent: I have reviewed the patients History and Physical, chart, labs and discussed the procedure including the risks, benefits and alternatives for the proposed anesthesia with the patient or authorized representative who has indicated his/her understanding and acceptance.     Dental advisory given  Plan Discussed with: CRNA and Surgeon  Anesthesia Plan Comments:         Anesthesia Quick Evaluation

## 2021-04-25 NOTE — Op Note (Signed)
Patient:  Dean Randolph  DOB:  1941-05-15  MRN:  [id number]   Preop Diagnosis: Cholecystitis, cholelithiasis, history of choledocholithiasis with Endo biliary stent  Postop Diagnosis: Same  Procedure: Laparoscopic cholecystectomy  Surgeon: [name], MD  Assistant: Algis Greenhouse, MD  Anes: General endotracheal  Indications: Patient is a 80 year old white male who is status post multiple ERCPs for common bile duct stones who now presents for cholecystectomy.  The patient still has a biliary stent in place for retained common bile duct stones.  The risks and benefits of the procedure including bleeding, infection, hepatobiliary injury, the possibility of an open procedure were fully explained to the patient, who gave informed consent.  Procedure note: The patient was placed in the supine position.  After induction of general endotracheal anesthesia, the abdomen was prepped and draped using the usual sterile technique with ChloraPrep.  Surgical site confirmation was performed.  The supraumbilical incision was made down to the fascia.  A Veress needle was introduced into the abdominal cavity and confirmation of placement was done using the saline drop test.  The abdomen was then insufflated to 15 mmHg pressure without difficulty.  The patient was placed in reverse Trendelenburg position and an additional 11 mm trocar was placed in the epigastric region and a 5 mm trochars were placed in the right upper quadrant and right flank regions.  The liver was inspected noted to be somewhat mildly cirrhotic in nature.  It also seemed age-appropriate.  The gallbladder was noted to be intrahepatic in nature with a very thickened gallbladder wall.  The gallbladder was difficult to retract superiorly.  It was elected to proceed with a dome down approach.  This did cause some liver edge bleeding which was controlled using Bovie electrocautery.  I did try to develop the triangle of Calot.  The cystic artery  was identified, clipped, and ligated.  The cystic duct was fully visualized while the gallbladder was suspended bipedicle.  An Endo GIA was placed across the cystic duct and fired.  Upon inspection of the staple line, there was disruption and some stones present.  These were milked out of the cystic duct remnant and another firing of the Endo GIA was performed.  The resultant staple line was intact.  The gallbladder fossa was copiously irrigated with normal saline.  Any bleeding was controlled using Bovie electrocautery.  Arista and Surgicel were placed in the gallbladder fossa.  The gallbladder was then removed using an Endo Catch bag.  All fluid and air were then evacuated from the abdominal cavity prior to removal of the trochars.  All wounds were irrigated with normal saline.  All wounds were injected with Exparel.  The supraumbilical fascia as well as epigastric fascia were reapproximated using an 0 Vicryl interrupted suture.  All skin incisions were closed using a 4-0 Monocryl subcuticular suture.  Dermabond was applied.  All tape and needle counts were correct at the end of the procedure.  The patient was extubated in the operating room and transferred to PACU in stable condition.  Complications: None  EBL: Minimal  Specimen: Gallbladder

## 2021-04-25 NOTE — Transfer of Care (Signed)
Immediate Anesthesia Transfer of Care Note  Patient: Dean Randolph  Procedure(s) Performed: LAPAROSCOPIC CHOLECYSTECTOMY (Abdomen)  Patient Location: PACU  Anesthesia Type:General  Level of Consciousness: awake, alert , oriented and patient cooperative  Airway & Oxygen Therapy: Patient Spontanous Breathing and Patient connected to nasal cannula oxygen  Post-op Assessment: Report given to RN and Post -op Vital signs reviewed and stable  Post vital signs: Reviewed and stable  Last Vitals:  Vitals Value Taken Time  BP 149/69 04/25/21 0954  Temp    Pulse 67 04/25/21 0956  Resp 18 04/25/21 0956  SpO2 99 % 04/25/21 0956  Vitals shown include unvalidated device data.  Last Pain:  Vitals:   04/25/21 0806  TempSrc: Oral  PainSc: 0-No pain      Patients Stated Pain Goal: 5 (04/25/21 0806)  Complications: No notable events documented.

## 2021-04-25 NOTE — Interval H&P Note (Signed)
History and Physical Interval Note:  04/25/2021 8:12 AM  Dean Randolph  has presented today for surgery, with the diagnosis of Cholelithiasis.  The various methods of treatment have been discussed with the patient and family. After consideration of risks, benefits and other options for treatment, the patient has consented to  Procedure(s): LAPAROSCOPIC CHOLECYSTECTOMY (N/A) as a surgical intervention.  The patient's history has been reviewed, patient examined, no change in status, stable for surgery.  I have reviewed the patient's chart and labs.  Questions were answered to the patient's satisfaction.     [name]

## 2021-04-25 NOTE — Anesthesia Postprocedure Evaluation (Signed)
Anesthesia Post Note  Patient: IDRISS QUACKENBUSH  Procedure(s) Performed: LAPAROSCOPIC CHOLECYSTECTOMY (Abdomen)  Patient location during evaluation: PACU Anesthesia Type: General Level of consciousness: awake and alert and oriented Pain management: pain level controlled Vital Signs Assessment: post-procedure vital signs reviewed and stable Respiratory status: spontaneous breathing, nonlabored ventilation and respiratory function stable Cardiovascular status: blood pressure returned to baseline and stable Postop Assessment: no apparent nausea or vomiting Anesthetic complications: no   No notable events documented.   Last Vitals:  Vitals:   04/25/21 1016 04/25/21 1030  BP: (!) 139/97 (!) 158/83  Pulse: 72 71  Resp: 16 18  Temp:    SpO2: 100% 99%    Last Pain:  Vitals:   04/25/21 1036  TempSrc:   PainSc: 2                  Abir Eroh C Maxfield Gildersleeve

## 2021-04-28 ENCOUNTER — Encounter (HOSPITAL_COMMUNITY): Payer: Self-pay | Admitting: General Surgery

## 2021-04-28 LAB — SURGICAL PATHOLOGY

## 2021-05-02 ENCOUNTER — Ambulatory Visit: Payer: Medicare Other | Admitting: Gastroenterology

## 2021-05-06 ENCOUNTER — Telehealth (INDEPENDENT_AMBULATORY_CARE_PROVIDER_SITE_OTHER): Payer: Medicare Other | Admitting: General Surgery

## 2021-05-06 DIAGNOSIS — Z09 Encounter for follow-up examination after completed treatment for conditions other than malignant neoplasm: Secondary | ICD-10-CM

## 2021-05-06 NOTE — Telephone Encounter (Signed)
Virtual telephone visit performed with patient.  He states he is doing well.  He is having no issues.  He is awaiting follow-up appointment with Dr. Karilyn Cota of gastroenterology for follow-up.  I told him to call me should any problems arise.  As this was a part of the global surgical fee, this was not a billable visit.  Total telephone time was 1-1/2 minutes.

## 2021-06-13 ENCOUNTER — Ambulatory Visit (INDEPENDENT_AMBULATORY_CARE_PROVIDER_SITE_OTHER): Payer: Medicare Other | Admitting: Family Medicine

## 2021-06-13 ENCOUNTER — Encounter: Payer: Self-pay | Admitting: Family Medicine

## 2021-06-13 VITALS — BP 163/79 | HR 86 | Temp 97.5°F | Ht 66.0 in | Wt 153.0 lb

## 2021-06-13 DIAGNOSIS — I10 Essential (primary) hypertension: Secondary | ICD-10-CM

## 2021-06-13 MED ORDER — AMLODIPINE BESYLATE 5 MG PO TABS: 5.0000 mg | ORAL_TABLET | Freq: Every day | ORAL | 3 refills | Status: DC

## 2021-06-13 NOTE — Progress Notes (Signed)
Subjective:  Patient ID: Dean Randolph, male    DOB: [birth year]  Age: 80 y.o. MRN: [id number]  CC: Medication Refill   HPI KUNAL LEVARIO presents for  follow-up of hypertension. Patient has no history of headache chest pain or shortness of breath or recent cough. Patient also denies symptoms of TIA such as focal numbness or weakness. Patient denies side effects from medication. States he ran out of med 3 weeks ago. Doesn't know the name, but feels sure it is the amlodipine listed.  Home readings running high.  History Aaryan has a past medical history of Hypertension.   He has a past surgical history that includes ERCP (N/A, 02/06/2021); biliary stent placement (N/A, 02/06/2021); Sphincterotomy (N/A, 02/06/2021); Cataract extraction (Bilateral); ERCP (N/A, 03/26/2021); Gastrointestinal stent removal (N/A, 03/26/2021); removal of stones (N/A, 03/26/2021); biliary stent placement (N/A, 03/26/2021); and Cholecystectomy (N/A, 04/25/2021).   His family history includes Stroke in his mother.He reports that he has never smoked. He has been exposed to tobacco smoke. He has never used smokeless tobacco. He reports that he does not drink alcohol and does not use drugs.  Current Outpatient Medications on File Prior to Visit  Medication Sig Dispense Refill   Multiple Vitamins-Minerals (MULTIVITAMIN WITH MINERALS) tablet Take 1 tablet by mouth 3 (three) times a week.     No current facility-administered medications on file prior to visit.    ROS Review of Systems  Constitutional:  Negative for fever.  Respiratory:  Negative for shortness of breath.   Cardiovascular:  Negative for chest pain.  Musculoskeletal:  Negative for arthralgias.  Skin:  Negative for rash.   Objective:  BP (!) 163/79    Pulse 86    Temp (!) 97.5 F (36.4 C) (Temporal)    Ht 5\' 6"  (1.676 m)    Wt 153 lb (69.4 kg)    SpO2 100%    BMI 24.69 kg/m   BP Readings from Last 3 Encounters:  06/13/21 (!) 163/79  04/25/21  (!) 158/83  04/23/21 (!) 183/87    Wt Readings from Last 3 Encounters:  06/13/21 153 lb (69.4 kg)  04/23/21 153 lb (69.4 kg)  04/15/21 153 lb (69.4 kg)     Physical Exam Vitals reviewed.  Constitutional:      Appearance: He is well-developed.  HENT:     Head: Normocephalic and atraumatic.     Right Ear: External ear normal.     Left Ear: External ear normal.     Mouth/Throat:     Pharynx: No oropharyngeal exudate or posterior oropharyngeal erythema.  Eyes:     Pupils: Pupils are equal, round, and reactive to light.  Cardiovascular:     Rate and Rhythm: Normal rate and regular rhythm.     Heart sounds: No murmur heard. Pulmonary:     Effort: No respiratory distress.     Breath sounds: Normal breath sounds.  Musculoskeletal:     Cervical back: Normal range of motion and neck supple.  Neurological:     Mental Status: He is alert and oriented to person, place, and time.      Assessment & Plan:   Jassen was seen today for medication refill.  Diagnoses and all orders for this visit:  Essential hypertension  Other orders -     amLODipine (NORVASC) 5 MG tablet; Take 1 tablet (5 mg total) by mouth daily.  Allergies as of 06/13/2021   Not on File      Medication List  Accurate as of June 13, 2021  9:26 AM. If you have any questions, ask your nurse or doctor.          STOP taking these medications    traMADol 50 MG tablet Commonly known as: Ultram Stopped by: Mechele Claude, MD       TAKE these medications    amLODipine 5 MG tablet Commonly known as: NORVASC Take 1 tablet (5 mg total) by mouth daily.   multivitamin with minerals tablet Take 1 tablet by mouth 3 (three) times a week.        Meds ordered this encounter  Medications   amLODipine (NORVASC) 5 MG tablet    Sig: Take 1 tablet (5 mg total) by mouth daily.    Dispense:  90 tablet    Refill:  3      Follow-up: Return in about 6 months (around 12/12/2021), or if symptoms  worsen or fail to improve, for hypertension.  Mechele Claude, M.D.

## 2021-10-20 ENCOUNTER — Telehealth (INDEPENDENT_AMBULATORY_CARE_PROVIDER_SITE_OTHER): Payer: Self-pay

## 2021-10-20 NOTE — Telephone Encounter (Signed)
Patient wife called today wanting to know when patient stent was going to be removed. She states the patient had a stent placed in September 2022 and then had gallbladder removed by Dr. Lovell Sheehan on Apr 25, 2021. She states the patient had a stone left in the bile duct and another stent was placed after the gallbladder was removed she would like to know when the stent and the stone were to be removed. Also is it hurting the patient by having it still in? Please advise.  ?

## 2021-10-21 NOTE — Telephone Encounter (Signed)
Per Dr. Laural Golden yes patient will need to have an ERCP done ASAP. ERCP needs to be done with stent removal and a spy glass this week, or next per Dr. Laural Golden. Patient made aware we will be reaching out to them to set this up.  ?

## 2021-10-24 ENCOUNTER — Encounter (HOSPITAL_COMMUNITY)
Admission: RE | Admit: 2021-10-24 | Discharge: 2021-10-24 | Disposition: A | Discharge status: Home / Self Care | Payer: HMO | Source: Ambulatory Visit | Attending: Internal Medicine | Admitting: Internal Medicine

## 2021-10-24 ENCOUNTER — Other Ambulatory Visit: Payer: Self-pay

## 2021-10-24 ENCOUNTER — Encounter (HOSPITAL_COMMUNITY): Payer: Self-pay

## 2021-10-24 ENCOUNTER — Other Ambulatory Visit (INDEPENDENT_AMBULATORY_CARE_PROVIDER_SITE_OTHER): Payer: Self-pay

## 2021-10-24 DIAGNOSIS — K8050 Calculus of bile duct without cholangitis or cholecystitis without obstruction: Secondary | ICD-10-CM

## 2021-10-27 ENCOUNTER — Ambulatory Visit (HOSPITAL_COMMUNITY): Payer: PPO | Admitting: Certified Registered Nurse Anesthetist

## 2021-10-27 ENCOUNTER — Encounter (HOSPITAL_COMMUNITY): Payer: Self-pay | Admitting: Internal Medicine

## 2021-10-27 ENCOUNTER — Ambulatory Visit (HOSPITAL_COMMUNITY)
Admission: RE | Admit: 2021-10-27 | Discharge: 2021-10-27 | Disposition: A | Discharge status: Home / Self Care | Payer: PPO | Attending: Internal Medicine | Admitting: Internal Medicine

## 2021-10-27 ENCOUNTER — Ambulatory Visit (HOSPITAL_COMMUNITY): Payer: PPO

## 2021-10-27 ENCOUNTER — Encounter (HOSPITAL_COMMUNITY)
Admission: RE | Disposition: A | Discharge status: Home / Self Care | Payer: Self-pay | Source: Home / Self Care | Attending: Internal Medicine

## 2021-10-27 ENCOUNTER — Ambulatory Visit (HOSPITAL_BASED_OUTPATIENT_CLINIC_OR_DEPARTMENT_OTHER): Payer: PPO | Admitting: Certified Registered Nurse Anesthetist

## 2021-10-27 DIAGNOSIS — K8051 Calculus of bile duct without cholangitis or cholecystitis with obstruction: Secondary | ICD-10-CM

## 2021-10-27 DIAGNOSIS — I10 Essential (primary) hypertension: Secondary | ICD-10-CM | POA: Insufficient documentation

## 2021-10-27 DIAGNOSIS — K838 Other specified diseases of biliary tract: Secondary | ICD-10-CM | POA: Diagnosis not present

## 2021-10-27 DIAGNOSIS — K8050 Calculus of bile duct without cholangitis or cholecystitis without obstruction: Secondary | ICD-10-CM

## 2021-10-27 DIAGNOSIS — Z4659 Encounter for fitting and adjustment of other gastrointestinal appliance and device: Secondary | ICD-10-CM

## 2021-10-27 HISTORY — PX: REMOVAL OF STONES: SHX5545

## 2021-10-27 HISTORY — PX: GASTROINTESTINAL STENT REMOVAL: SHX6384

## 2021-10-27 HISTORY — PX: SPHINCTEROTOMY: SHX5279

## 2021-10-27 HISTORY — PX: ERCP: SHX5425

## 2021-10-27 LAB — COMPREHENSIVE METABOLIC PANEL
ALT: 147 U/L — ABNORMAL HIGH (ref 0–44)
AST: 128 U/L — ABNORMAL HIGH (ref 15–41)
Albumin: 3.2 g/dL — ABNORMAL LOW (ref 3.5–5.0)
Alkaline Phosphatase: 580 U/L — ABNORMAL HIGH (ref 38–126)
Anion gap: 7 (ref 5–15)
BUN: 21 mg/dL (ref 8–23)
CO2: 24 mmol/L (ref 22–32)
Calcium: 8.8 mg/dL — ABNORMAL LOW (ref 8.9–10.3)
Chloride: 107 mmol/L (ref 98–111)
Creatinine, Ser: 0.69 mg/dL (ref 0.61–1.24)
GFR, Estimated: >60 mL/min (ref >60)
Glucose, Bld: 91 mg/dL (ref 70–99)
Potassium: 4.1 mmol/L (ref 3.5–5.1)
Sodium: 138 mmol/L (ref 135–145)
Total Bilirubin: 3.7 mg/dL — ABNORMAL HIGH (ref 0.3–1.2)
Total Protein: 7.8 g/dL (ref 6.5–8.1)

## 2021-10-27 LAB — CBC
HCT: 37.8 % — ABNORMAL LOW (ref 39.0–52.0)
Hemoglobin: 12 g/dL — ABNORMAL LOW (ref 13.0–17.0)
MCH: 31.4 pg (ref 26.0–34.0)
MCHC: 31.7 g/dL (ref 30.0–36.0)
MCV: 99 fL (ref 80.0–100.0)
Platelets: 211 10*3/uL (ref 150–400)
RBC: 3.82 MIL/uL — ABNORMAL LOW (ref 4.22–5.81)
RDW: 15.2 % (ref 11.5–15.5)
WBC: 10.6 10*3/uL — ABNORMAL HIGH (ref 4.0–10.5)
nRBC: 0 % (ref 0.0–0.2)

## 2021-10-27 SURGERY — ERCP, WITH INTERVENTION IF INDICATED
Anesthesia: General

## 2021-10-27 MED ORDER — GLYCOPYRROLATE PF 0.2 MG/ML IJ SOSY
PREFILLED_SYRINGE | INTRAMUSCULAR | Status: AC
  Filled 2021-10-27: qty 3

## 2021-10-27 MED ORDER — ONDANSETRON HCL 4 MG/2ML IJ SOLN
INTRAMUSCULAR | Status: AC
  Filled 2021-10-27: qty 2

## 2021-10-27 MED ORDER — CEFAZOLIN SODIUM-DEXTROSE 2-4 GM/100ML-% IV SOLN
2.0000 g | Freq: Once | INTRAVENOUS | Status: AC
  Administered 2021-10-27: 2 g via INTRAVENOUS

## 2021-10-27 MED ORDER — PROPOFOL 10 MG/ML IV BOLUS
INTRAVENOUS | Status: DC | PRN
  Administered 2021-10-27: 150 mg via INTRAVENOUS

## 2021-10-27 MED ORDER — CEFAZOLIN SODIUM-DEXTROSE 2-4 GM/100ML-% IV SOLN
INTRAVENOUS | Status: AC
  Filled 2021-10-27: qty 100

## 2021-10-27 MED ORDER — SIMETHICONE 40 MG/0.6ML PO SUSP
ORAL | Status: AC
  Filled 2021-10-27: qty 0.6

## 2021-10-27 MED ORDER — LIDOCAINE HCL (PF) 2 % IJ SOLN
INTRAMUSCULAR | Status: AC
  Filled 2021-10-27: qty 5

## 2021-10-27 MED ORDER — DEXAMETHASONE SODIUM PHOSPHATE 10 MG/ML IJ SOLN
INTRAMUSCULAR | Status: DC | PRN
  Administered 2021-10-27: 10 mg via INTRAVENOUS

## 2021-10-27 MED ORDER — ARTIFICIAL TEARS OPHTHALMIC OINT
TOPICAL_OINTMENT | OPHTHALMIC | Status: AC
  Filled 2021-10-27: qty 3.5

## 2021-10-27 MED ORDER — GLYCOPYRROLATE PF 0.2 MG/ML IJ SOSY
PREFILLED_SYRINGE | INTRAMUSCULAR | Status: DC | PRN
  Administered 2021-10-27: .1 mg via INTRAVENOUS

## 2021-10-27 MED ORDER — FENTANYL CITRATE (PF) 250 MCG/5ML IJ SOLN
INTRAMUSCULAR | Status: DC | PRN
  Administered 2021-10-27: 50 ug via INTRAVENOUS

## 2021-10-27 MED ORDER — FENTANYL CITRATE (PF) 100 MCG/2ML IJ SOLN
INTRAMUSCULAR | Status: AC
  Filled 2021-10-27: qty 2

## 2021-10-27 MED ORDER — GLUCAGON HCL RDNA (DIAGNOSTIC) 1 MG IJ SOLR
INTRAMUSCULAR | Status: DC | PRN
  Administered 2021-10-27: .25 mg via INTRAVENOUS

## 2021-10-27 MED ORDER — STERILE WATER FOR IRRIGATION IR SOLN: Status: DC | PRN

## 2021-10-27 MED ORDER — ROCURONIUM BROMIDE 10 MG/ML (PF) SYRINGE
PREFILLED_SYRINGE | INTRAVENOUS | Status: AC
  Filled 2021-10-27: qty 10

## 2021-10-27 MED ORDER — GLUCAGON HCL RDNA (DIAGNOSTIC) 1 MG IJ SOLR
INTRAMUSCULAR | Status: AC
  Filled 2021-10-27: qty 1

## 2021-10-27 MED ORDER — LACTATED RINGERS IV SOLN: INTRAVENOUS | Status: DC

## 2021-10-27 MED ORDER — CHLORHEXIDINE GLUCONATE 0.12 % MT SOLN
OROMUCOSAL | Status: AC
  Administered 2021-10-27: 15 mL via OROMUCOSAL
  Filled 2021-10-27: qty 15

## 2021-10-27 MED ORDER — SUCCINYLCHOLINE CHLORIDE 200 MG/10ML IV SOSY
PREFILLED_SYRINGE | INTRAVENOUS | Status: AC
  Filled 2021-10-27: qty 10

## 2021-10-27 MED ORDER — SODIUM CHLORIDE 0.9 % IV SOLN
INTRAVENOUS | Status: AC
  Filled 2021-10-27: qty 50

## 2021-10-27 MED ORDER — SUCCINYLCHOLINE CHLORIDE 200 MG/10ML IV SOSY
PREFILLED_SYRINGE | INTRAVENOUS | Status: DC | PRN
  Administered 2021-10-27: 100 mg via INTRAVENOUS

## 2021-10-27 MED ORDER — PANTOPRAZOLE SODIUM 40 MG PO TBEC: 40.0000 mg | DELAYED_RELEASE_TABLET | Freq: Every day | ORAL | 5 refills | Status: DC

## 2021-10-27 MED ORDER — FENTANYL CITRATE PF 50 MCG/ML IJ SOSY: 25.0000 ug | PREFILLED_SYRINGE | INTRAMUSCULAR | Status: DC | PRN

## 2021-10-27 MED ORDER — GLUCAGON HCL RDNA (DIAGNOSTIC) 1 MG IJ SOLR
INTRAMUSCULAR | Status: AC
  Filled 2021-10-27: qty 2

## 2021-10-27 MED ORDER — CEFAZOLIN SODIUM-DEXTROSE 2-3 GM-%(50ML) IV SOLR: INTRAVENOUS | Status: DC | PRN

## 2021-10-27 MED ORDER — CHLORHEXIDINE GLUCONATE 0.12 % MT SOLN: 15.0000 mL | Freq: Once | OROMUCOSAL | Status: AC

## 2021-10-27 MED ORDER — ONDANSETRON HCL 4 MG/2ML IJ SOLN
INTRAMUSCULAR | Status: DC | PRN
  Administered 2021-10-27: 4 mg via INTRAVENOUS

## 2021-10-27 MED ORDER — LIDOCAINE 2% (20 MG/ML) 5 ML SYRINGE
INTRAMUSCULAR | Status: DC | PRN
  Administered 2021-10-27: 80 mg via INTRAVENOUS

## 2021-10-27 MED ORDER — ORAL CARE MOUTH RINSE: 15.0000 mL | Freq: Once | OROMUCOSAL | Status: AC

## 2021-10-27 MED ORDER — ONDANSETRON HCL 4 MG/2ML IJ SOLN: 4.0000 mg | Freq: Once | INTRAMUSCULAR | Status: DC | PRN

## 2021-10-27 MED ORDER — PHENYLEPHRINE HCL (PRESSORS) 10 MG/ML IV SOLN
INTRAVENOUS | Status: DC | PRN
  Administered 2021-10-27 (×4): 80 ug via INTRAVENOUS

## 2021-10-27 MED ORDER — ARTIFICIAL TEARS OPHTHALMIC OINT
TOPICAL_OINTMENT | OPHTHALMIC | Status: DC | PRN
  Administered 2021-10-27: 1 via OPHTHALMIC

## 2021-10-27 MED ORDER — PHENYLEPHRINE 80 MCG/ML (10ML) SYRINGE FOR IV PUSH (FOR BLOOD PRESSURE SUPPORT)
PREFILLED_SYRINGE | INTRAVENOUS | Status: AC
  Filled 2021-10-27: qty 10

## 2021-10-27 MED ORDER — ATROPINE SULFATE 0.4 MG/ML IV SOLN
INTRAVENOUS | Status: AC
  Filled 2021-10-27: qty 1

## 2021-10-27 MED ORDER — DEXAMETHASONE SODIUM PHOSPHATE 10 MG/ML IJ SOLN
INTRAMUSCULAR | Status: AC
  Filled 2021-10-27: qty 1

## 2021-10-27 MED ORDER — SODIUM CHLORIDE 0.9 % IV SOLN: INTRAVENOUS | Status: DC | PRN

## 2021-10-27 MED ORDER — PROPOFOL 10 MG/ML IV BOLUS
INTRAVENOUS | Status: AC
  Filled 2021-10-27: qty 20

## 2021-10-27 SURGICAL SUPPLY — 30 items
BALLN RETRIEVAL 12X15 (BALLOONS) IMPLANT
BASKET TRAPEZOID 3X6 (MISCELLANEOUS) IMPLANT
BASKET TRAPEZOID LITHO 2.0X5 (MISCELLANEOUS) ×1 IMPLANT
DEVICE INFLATION ENCORE 26 (MISCELLANEOUS) IMPLANT
DEVICE LOCKING W-BIOPSY CAP (MISCELLANEOUS) ×1 IMPLANT
FIBER LASER SLIMLINE GI 365 (MISCELLANEOUS) ×1 IMPLANT
FORCEPS BIOP RJ4 240 HOT (CUTTING FORCEPS) IMPLANT
FORCEPS BIOP SPYBITE 1.2X286 (FORCEP) ×1 IMPLANT
GLOVE BIOGEL PI IND STRL 7.0 (GLOVE) ×2 IMPLANT
GLOVE BIOGEL PI INDICATOR 7.0 (GLOVE)
GUIDEWIRE HYDRA JAGWIRE .35 (WIRE) IMPLANT
GUIDEWIRE JAG HINI 025X260CM (WIRE) IMPLANT
KIT ENDO PROCEDURE PEN (KITS) ×1 IMPLANT
KIT TURNOVER KIT A (KITS) ×1 IMPLANT
LUBRICANT JELLY 4.5OZ STERILE (MISCELLANEOUS) ×1 IMPLANT
PAD ARMBOARD 7.5X6 YLW CONV (MISCELLANEOUS) ×1 IMPLANT
PATHFINDER 450CM 0.18 (STENTS) IMPLANT
POSITIONER HEAD 8X9X4 ADT (SOFTGOODS) IMPLANT
PROBE VISUALIZATION SPYGLASS (PROBE) ×1 IMPLANT
SCOPE SPY DS DISPOSABLE (MISCELLANEOUS) ×1 IMPLANT
SNARE ROTATE MED OVAL 20MM (MISCELLANEOUS) IMPLANT
SNARE SHORT THROW 13M SML OVAL (MISCELLANEOUS) IMPLANT
SPHINCTEROTOME AUTOTOME .25 (MISCELLANEOUS) IMPLANT
SPHINCTEROTOME HYDRATOME 44 (MISCELLANEOUS) ×1 IMPLANT
SYR 50ML LL SCALE MARK (SYRINGE) ×1 IMPLANT
SYSTEM CONTINUOUS INJECTION (MISCELLANEOUS) ×1 IMPLANT
TUBING INSUFFLATOR CO2MPACT (TUBING) ×1 IMPLANT
WALLSTENT METAL COVERED 10X60 (STENTS) IMPLANT
WALLSTENT METAL COVERED 10X80 (STENTS) IMPLANT
WATER STERILE IRR 1000ML POUR (IV SOLUTION) ×1 IMPLANT

## 2021-10-27 NOTE — Discharge Instructions (Signed)
No aspirin for 3 days. ?Resume usual medications as before. ?Pantoprazole 40 mg by mouth 30 minutes before breakfast daily. ?Clear liquids today and usual diet starting tomorrow. ?No driving for 24 hours. ?We will arrange for referral to tertiary center. ?

## 2021-10-27 NOTE — Transfer of Care (Signed)
Immediate Anesthesia Transfer of Care Note ? ?Patient: Dean Randolph ? ?Procedure(s) Performed: ENDOSCOPIC RETROGRADE CHOLANGIOPANCREATOGRAPHY (ERCP) ?GASTROINTESTINAL STENT EXCHANGE ?SPHINCTEROTOMY ?REMOVAL OF STONES ? ?Patient Location: PACU ? ?Anesthesia Type:General ? ?Level of Consciousness: awake and patient cooperative ? ?Airway & Oxygen Therapy: Patient Spontanous Breathing ? ?Post-op Assessment: Report given to RN and Post -op Vital signs reviewed and stable ? ?Post vital signs: Reviewed and stable ? ?Last Vitals:  ?Vitals Value Taken Time  ?BP 134/77 10/27/21 1513  ?Temp 37.3 ?C 10/27/21 1513  ?Pulse 74 10/27/21 1513  ?Resp 20 10/27/21 1513  ?SpO2 100 % 10/27/21 1513  ? ? ?Last Pain:  ?Vitals:  ? 10/27/21 1106  ?TempSrc: Oral  ?PainSc: 0-No pain  ?   ? ?Patients Stated Pain Goal: 5 (10/27/21 1106) ? ?Complications: No notable events documented. ?

## 2021-10-27 NOTE — Anesthesia Preprocedure Evaluation (Signed)
Anesthesia Evaluation  ?Patient identified by MRN, date of birth, ID band ?Patient awake ? ? ? ?Reviewed: ?Allergy & Precautions, H&P , NPO status , Patient's Chart, lab work & pertinent test results, reviewed documented beta blocker date and time  ? ?Airway ?Mallampati: II ? ?TM Distance: >3 FB ?Neck ROM: full ? ? ? Dental ?no notable dental hx. ? ?  ?Pulmonary ?neg pulmonary ROS,  ?  ?Pulmonary exam normal ?breath sounds clear to auscultation ? ? ? ? ? ? Cardiovascular ?Exercise Tolerance: Good ?hypertension, negative cardio ROS ? ? ?Rhythm:regular Rate:Normal ? ? ?  ?Neuro/Psych ?negative neurological ROS ? negative psych ROS  ? GI/Hepatic ?negative GI ROS, Neg liver ROS,   ?Endo/Other  ?negative endocrine ROS ? Renal/GU ?negative Renal ROS  ?negative genitourinary ?  ?Musculoskeletal ? ? Abdominal ?  ?Peds ? Hematology ? ?(+) Blood dyscrasia, anemia ,   ?Anesthesia Other Findings ? ? Reproductive/Obstetrics ?negative OB ROS ? ?  ? ? ? ? ? ? ? ? ? ? ? ? ? ?  ?  ? ? ? ? ? ? ? ? ?Anesthesia Physical ?Anesthesia Plan ? ?ASA: 2 ? ?Anesthesia Plan: General and General ETT  ? ?Post-op Pain Management:   ? ?Induction:  ? ?PONV Risk Score and Plan: Ondansetron ? ?Airway Management Planned:  ? ?Additional Equipment:  ? ?Intra-op Plan:  ? ?Post-operative Plan:  ? ?Informed Consent: I have reviewed the patients History and Physical, chart, labs and discussed the procedure including the risks, benefits and alternatives for the proposed anesthesia with the patient or authorized representative who has indicated his/her understanding and acceptance.  ? ? ? ?Dental Advisory Given ? ?Plan Discussed with: CRNA ? ?Anesthesia Plan Comments:   ? ? ? ? ? ? ?Anesthesia Quick Evaluation ? ?

## 2021-10-27 NOTE — Op Note (Addendum)
[facility] ?Patient Name: Dean Randolph ?Procedure Date: 10/27/2021 1:22 PM ?MRN: [id number] ?Date of Birth: [birth year] ?Attending MD: Hildred Laser , MD ?CSN: [id number] ?Age: 81 ?Admit Type: Outpatient ?Procedure:                ERCP ?Indications:              Common hepatic duct stone(s) and Mirizzi syndrome ?Providers:                Hildred Laser, MD, Charlsie Quest Theda Sers RN, RN, Kenney Houseman  ?                          Wilson ?Referring MD:             Claretta Fraise, MD ?Medicines:                General Anesthesia ?Complications:            No immediate complications. ?Estimated Blood Loss:     Estimated blood loss: none. ?Procedure:                Pre-Anesthesia Assessment: ?                          - Prior to the procedure, a History and Physical  ?                          was performed, and patient medications and  ?                          allergies were reviewed. The patient's tolerance of  ?                          previous anesthesia was also reviewed. The risks  ?                          and benefits of the procedure and the sedation  ?                          options and risks were discussed with the patient.  ?                          All questions were answered, and informed consent  ?                          was obtained. Prior Anticoagulants: The patient has  ?                          taken no previous anticoagulant or antiplatelet  ?                          agents. ASA Grade Assessment: II - A patient with  ?                          mild systemic disease. After reviewing the risks  ?  and benefits, the patient was deemed in  ?                          satisfactory condition to undergo the procedure. ?                          After obtaining informed consent, the scope was  ?                          passed under direct vision. Throughout the  ?                          procedure, the patient's blood pressure, pulse, and  ?                          oxygen  saturations were monitored continuously. The  ?                          [employer] D single use  ?                          duodenoscope was introduced through the mouth, and  ?                          used to inject contrast into and used to inject  ?                          contrast into the bile duct. The ERCP was  ?                          accomplished without difficulty. The patient  ?                          tolerated the procedure well. ?Scope In: 2:01:59 PM ?Scope Out: 2:56:22 PM ?Total Procedure Duration: 0 hours 54 minutes 23 seconds  ?Findings: ?     A biliary stent was visible on the scout film. One stent was removed  ?     from the biliary tree using a snare. The stent was found to be occluded  ?     via the water column test. A 5 mm biliary sphincterotomy was made with a  ?     braided Autotome sphincterotome using ERBE electrocautery. There was no  ?     post-sphincterotomy bleeding. The bile duct was deeply cannulated with  ?     the Autotome sphincterotome. Contrast was injected. I personally  ?     interpreted the bile duct images. Ductal flow of contrast was adequate.  ?     Image quality was excellent. Contrast extended to the entire biliary  ?     tree. The common bile duct contained moderate stenoses. The common  ?     hepatic duct was moderately dilated and diffusely dilated. The largest  ?     diameter was 15 mm. The common hepatic duct contained two stones. To  ?     discover objects, the biliary tree was swept with a 12 mm balloon  ?  starting at the bifurcation. Two stones were removed. Two stones  ?     remained. One 10 Fr by 9 cm plastic stent with a single external flap  ?     and a single internal flap was placed 8 cm into the common bile duct.  ?     Fluid and bile flowed through the stent. The stent was in good position. ?Impression:               - Biliary stent was removed and noted to be  ?                          occluded. ?                          -  Prior sphincterotomy appeared to have partially  ?                          closed. Therefore sphincterotomy extended ?                          - Stricture noted at the junction of common hepatic  ?                          and bile duct with upstream dilation ?                          - Two large stones noted in common hepatic duct ?                          - 2 small pigmented stones were removed with stone  ?                          balloon extractor. ?                          - Unable to advance Damia basket for mechanical  ?                          lithotripsy. ?                          -10 French 9 cm long plastic biliary stent placed  ?                          for decompression. ?Moderate Sedation: ?     Per Anesthesia Care ?Recommendation:           - Avoid aspirin and nonsteroidal anti-inflammatory  ?                          medicines for 3 days. ?                          - Clear liquid diet today. ?                          - Continue present medications. ?                          -  LFTs next week. ?                          - Referral to tertiary center. ?                          - Pantoprazole 40 mg po qam for GERD. ?Procedure Code(s):        --- Professional --- ?                          [phone], Endoscopic retrograde  ?                          cholangiopancreatography (ERCP); with removal and  ?                          exchange of stent(s), biliary or pancreatic duct,  ?                          including pre- and post-dilation and guide wire  ?                          passage, when performed, including sphincterotomy,  ?                          when performed, each stent exchanged ?                          [id number], Endoscopic retrograde  ?                          cholangiopancreatography (ERCP); with removal of  ?                          calculi/debris from biliary/pancreatic duct(s) ?Diagnosis Code(s):        --- Professional --- ?                          K80.51, Calculus of bile duct  without cholangitis  ?                          or cholecystitis with obstruction ?                          Z46.59, Encounter for fitting and adjustment of  ?                          other gastrointestinal appliance and device ?                          K83.8, Other specified diseases of biliary tract ?CPT copyright 2019 American Medical Association. All rights reserved. ?The codes documented in this report are preliminary and upon coder review may  ?be revised to meet current compliance requirements. ?Hildred Laser, MD ?Hildred Laser, MD ?10/27/2021 3:41:38 PM ?This report has been signed electronically. ?Number of Addenda: 0 ?

## 2021-10-27 NOTE — H&P (Signed)
Dean Randolph is an 81 y.o. male.   ?Chief Complaint: Patient is here for ERCP with stent and stone removal. ?HPI: Patient is 81 year old Caucasian male who presented back in August 2022 with obstructive jaundice.  He was found to have multiple stones in the bile duct which were immobile and could not be removed.  Biliary stent was left in place.  He returned in September 2022 when multiple stones were removed but 1.  He was felt to have Dean Randolph.  Common hepatic duct was mildly dilated.  He went on to have cholecystectomy.  Dr. Lovell Sheehan was able to remove all the stones from the cystic duct which were felt to be impinging upon the bile duct.  He has done well.  He is not returning for stent and stone removal.  Since his surgery he has not had any abdominal pain nausea vomiting.  His appetite is good and his weight has been stable.  He also denies chest pain or shortness of breath. ? ?Past Medical History:  ?Diagnosis Date  ? Hypertension   ? ? ?Past Surgical History:  ?Procedure Laterality Date  ? BILIARY STENT PLACEMENT N/A 02/06/2021  ? Procedure: BILIARY STENT PLACEMENT;  Surgeon: Malissa Hippo, MD;  Location: [facility];  Service: Gastroenterology;  Laterality: N/A;  ? BILIARY STENT PLACEMENT N/A 03/26/2021  ? Procedure: BILIARY STENT PLACEMENT 10 FRENCH BY 9cm;  Surgeon: Malissa Hippo, MD;  Location: [facility];  Service: Endoscopy;  Laterality: N/A;  ? CATARACT EXTRACTION Bilateral   ? CHOLECYSTECTOMY N/A 04/25/2021  ? Procedure: LAPAROSCOPIC CHOLECYSTECTOMY;  Surgeon: [name], MD;  Location: [facility];  Service: General;  Laterality: N/A;  ? ERCP N/A 02/06/2021  ? Procedure: ENDOSCOPIC RETROGRADE CHOLANGIOPANCREATOGRAPHY (ERCP);  Surgeon: Malissa Hippo, MD;  Location: [facility];  Service: Gastroenterology;  Laterality: N/A;  ? ERCP N/A 03/26/2021  ? Procedure: ENDOSCOPIC RETROGRADE CHOLANGIOPANCREATOGRAPHY (ERCP) with EXTENDED SPHINCTEROTOMY;  Surgeon: Malissa Hippo, MD;  Location: [facility];   Service: Endoscopy;  Laterality: N/A;  ? GASTROINTESTINAL STENT REMOVAL N/A 03/26/2021  ? Procedure: GASTROINTESTINAL STENT REMOVAL;  Surgeon: Malissa Hippo, MD;  Location: [facility];  Service: Endoscopy;  Laterality: N/A;  ? REMOVAL OF STONES N/A 03/26/2021  ? Procedure: REMOVAL OF STONES WITH EXTRACTION BASKET;  Surgeon: Malissa Hippo, MD;  Location: [facility];  Service: Endoscopy;  Laterality: N/A;  ? SPHINCTEROTOMY N/A 02/06/2021  ? Procedure: SPHINCTEROTOMY;  Surgeon: Malissa Hippo, MD;  Location: [facility];  Service: Gastroenterology;  Laterality: N/A;  ? ? ?Family History  ?Problem Relation Age of Onset  ? Stroke Mother   ? ?Social History:  reports that he has never smoked. He has been exposed to tobacco smoke. He has never used smokeless tobacco. He reports that he does not drink alcohol and does not use drugs. ? ?Allergies: Not on File ? ?Medications Prior to Admission  ?Medication Sig Dispense Refill  ? amLODipine (NORVASC) 5 MG tablet Take 1 tablet (5 mg total) by mouth daily. 90 tablet 3  ? Multiple Vitamins-Minerals (MULTIVITAMIN WITH MINERALS) tablet Take 1 tablet by mouth 3 (three) times a week.    ? ? ?No results found for this or any previous visit (from the past 48 hour(s)). ?No results found. ? ?Review of Systems ? ?Blood pressure (!) 139/92, pulse 76, temperature (!) 97.5 ?F (36.4 ?C), temperature source Oral, resp. rate 18, SpO2 100 %. ?Physical Exam ?HENT:  ?   Mouth/Throat:  ?   Mouth: Mucous membranes  are moist.  ?   Pharynx: Oropharynx is clear.  ?Eyes:  ?   General: No scleral icterus. ?   Conjunctiva/sclera: Conjunctivae normal.  ?Cardiovascular:  ?   Rate and Rhythm: Normal rate and regular rhythm.  ?   Heart sounds: Normal heart sounds. No murmur heard. ?Pulmonary:  ?   Effort: Pulmonary effort is normal.  ?   Breath sounds: Normal breath sounds.  ?Abdominal:  ?   General: There is no distension.  ?   Palpations: Abdomen is soft. There is no mass.  ?   Tenderness: There is no abdominal  tenderness.  ?Musculoskeletal:     ?   General: No swelling.  ?   Cervical back: Neck supple.  ?Lymphadenopathy:  ?   Cervical: No cervical adenopathy.  ?Skin: ?   General: Skin is warm and dry.  ?Neurological:  ?   Mental Status: He is alert.  ?  ? ?Assessment/Plan ? ?History of choledocholithiasis.   ?Dean Randolph ?ERCP with stent and stone removal.   ?Possible spyglass examination of bile duct. ?[name], MD ?10/27/2021, 1:04 PM ? ? ? ?

## 2021-10-27 NOTE — Progress Notes (Signed)
Brief ERCP note. ? ?Biliary stent in place. ?Periampullary diverticula. ?Biliary stent was removed.  On examination it appeared to be occluded. ?CBD easily cannulated with Rx 44 autotome and 035 Hydra Jagwire. ?Dilated CHD with 2 filling defects. ?Stricture noted at junction of CHD and CBD. ?Biliary sphincterotomy appeared to have closed.  Biliary sphincterotomy extended. ?Unable to advance Damia basket to do mechanical lithotripsy. ?2 small pigmented stones were removed with stone balloon extractor. ?10 French 9 cm plastic biliary stent was placed for decompression. ? ?Patient tolerated the procedure well. ? ?

## 2021-10-27 NOTE — Anesthesia Procedure Notes (Signed)
Procedure Name: Intubation ?Date/Time: 10/27/2021 1:52 PM ?Performed by: Maude Leriche, CRNA ?Pre-anesthesia Checklist: Patient identified, Emergency Drugs available, Suction available and Patient being monitored ?Patient Re-evaluated:Patient Re-evaluated prior to induction ?Oxygen Delivery Method: Circle system utilized ?Preoxygenation: Pre-oxygenation with 100% oxygen ?Induction Type: IV induction ?Ventilation: Mask ventilation without difficulty ?Laryngoscope Size: [name] Heck and 2 ?Grade View: Grade I ?Tube type: Oral ?Tube size: 7.5 mm ?Number of attempts: 1 ?Airway Equipment and Method: Stylet ?Placement Confirmation: ETT inserted through vocal cords under direct vision, positive ETCO2 and breath sounds checked- equal and bilateral ?Secured at: 23 cm ?Tube secured with: Tape ?Dental Injury: Teeth and Oropharynx as per pre-operative assessment  ? ? ? ? ?

## 2021-10-28 NOTE — Anesthesia Postprocedure Evaluation (Signed)
Anesthesia Post Note ? ?Patient: Dean Randolph ? ?Procedure(s) Performed: ENDOSCOPIC RETROGRADE CHOLANGIOPANCREATOGRAPHY (ERCP) ?GASTROINTESTINAL STENT EXCHANGE ?SPHINCTEROTOMY ?REMOVAL OF STONES ? ?Patient location during evaluation: Phase II ?Anesthesia Type: General ?Level of consciousness: awake ?Pain management: pain level controlled ?Vital Signs Assessment: post-procedure vital signs reviewed and stable ?Respiratory status: spontaneous breathing and respiratory function stable ?Cardiovascular status: blood pressure returned to baseline and stable ?Postop Assessment: no headache and no apparent nausea or vomiting ?Anesthetic complications: no ?Comments: Late entry ? ? ?No notable events documented. ? ? ?Last Vitals:  ?Vitals:  ? 10/27/21 1530 10/27/21 1542  ?BP: 140/75 140/75  ?Pulse: 74 69  ?Resp: 19 18  ?Temp:  36.7 ?C  ?SpO2: 100% 99%  ?  ?Last Pain:  ?Vitals:  ? 10/27/21 1542  ?TempSrc: Oral  ?PainSc: 0-No pain  ? ? ?  ?  ?  ?  ?  ?  ? ?Windell Norfolk ? ? ? ? ?

## 2021-10-30 ENCOUNTER — Telehealth (INDEPENDENT_AMBULATORY_CARE_PROVIDER_SITE_OTHER): Payer: Self-pay | Admitting: *Deleted

## 2021-10-30 NOTE — Telephone Encounter (Signed)
Pt called and left message that he spoke to dr Karilyn Cota and wanted him to call back. I called the patient back to see what concerns he had and he said Dr. Karilyn Cota wants him to go to [facility] to have a stent taken out and he is not going to be able to get to [place] [facility] due to not having anyone to take him and wants to wait and go to [facility].  ?

## 2021-10-31 ENCOUNTER — Encounter (HOSPITAL_COMMUNITY): Payer: Self-pay | Admitting: Internal Medicine

## 2021-10-31 NOTE — Telephone Encounter (Signed)
Dr. Karilyn Cota told me he would call patient to discuss but to go ahead and send referral to [facility]. - Dewayne Hatch was notified to send referral.  ?

## 2021-11-04 ENCOUNTER — Telehealth: Payer: Self-pay

## 2021-11-04 NOTE — Telephone Encounter (Signed)
-----   Message from Lemar Lofty., MD sent at 11/04/2021  8:11 AM EDT ----- ?NR, ?We will try to get him in though at this point it is now looking towards July/August.  He certainly needs to be aware that if issues arise he should come to the hospital or if you repeat LFTs and they are not decreasing in value then he may need to come into the hospital anyway.  If he is adamant not going to [facility] we are happy to try to accommodate and hopefully there will be a cancellation. ?Thanks. ?GM ?----- Message ----- ?From: Malissa Hippo, MD ?Sent: 11/03/2021   7:25 PM EDT ?To: Lemar Lofty., MD ? ?Hello Vicente Serene, ?After hearing for you I made an appointment for her to see Dr. Corliss Parish but patient does not want to go to [facility].  Therefore please go ahead and make an appointment to see him as soon as you can. ?Thank you ?Najeeb ?----- Message ----- ?From: Mansouraty, Netty Starring., MD ?Sent: 10/27/2021   5:10 PM EDT ?To: Malissa Hippo, MD ? ?NR, ?Thanks for reaching out. ?Happy to be available for this patient. ?I saw his LFTs today though hard to interpret when he had active biliary disease and an obstructed biliary stent. ?If you feel that the stent has traversed things appropriately, that will buy [place] some time.  I am scheduled out into June/July but certainly can put him on my list.  If he repeat his LFTs in a couple weeks and he has not had an improvement, we will see if we can try to find any open slots that have developed or if one of the other endoscopist will be willing to consider spyglass lithotripsy. ?If you agree with this plan of action let me know and then I will forward this to Chakara Bognar to work on scheduling my next available ERCP. ?And then please forward me the results of the hepatic function panel in 2 weeks to see where things stand. ?Thanks. ?GM ?----- Message ----- ?From: Malissa Hippo, MD ?Sent: 10/27/2021   3:25 PM EDT ?To: Lemar Lofty., MD ? ?Hello Vicente Serene, ?When you have a  moment would you please review the images and let me know if he would like to see patient for further studies. ?He presented with cholangitis back in August, 2022 and had multiple stones which are impacted.  I could not remove the stones.  Biliary stent was placed.  He returned in August 2022.  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  Therefore left a stent in place.  He went on to have a cholecystectomy. ?He returns today for scheduled procedure for me to remove the stent and stones out.  While he was not having any pain in his bili and transaminases were elevated. ?Biliary stent was removed and noted to be occluded. ?Common hepatic duct was dilated with 2 stones.  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  I was able to remove 2 small pigmented stones but unable to remove the big stones.  I am afraid he will need more than 1 ERCP for his condition. ?I hope you can fit him into the schedule. ?Let me know. ?Regards ? ?Najeeb ? ? ? ? ?

## 2021-11-05 ENCOUNTER — Other Ambulatory Visit: Payer: Self-pay

## 2021-11-05 DIAGNOSIS — K8050 Calculus of bile duct without cholangitis or cholecystitis without obstruction: Secondary | ICD-10-CM

## 2021-11-05 NOTE — Telephone Encounter (Signed)
Could you please ask Dean Randolph to go to the lab sometime this week or next for LFTs ?

## 2021-11-05 NOTE — Telephone Encounter (Signed)
ERCP has been scheduled for 01/19/22 at [facility] with GM ? ?Left message on machine to call back  ?

## 2021-11-05 NOTE — Telephone Encounter (Signed)
ERCP scheduled, pt instructed and medications reviewed.  Patient instructions mailed to home and sent to My Chart.  Patient to call with any questions or concerns.  

## 2021-11-06 ENCOUNTER — Other Ambulatory Visit (INDEPENDENT_AMBULATORY_CARE_PROVIDER_SITE_OTHER): Payer: Self-pay | Admitting: *Deleted

## 2021-11-06 DIAGNOSIS — K8050 Calculus of bile duct without cholangitis or cholecystitis without obstruction: Secondary | ICD-10-CM

## 2021-11-06 NOTE — Telephone Encounter (Signed)
Patient aware and [facility] order [facility] to [facility]. Pt states he will do next week.  ?

## 2021-11-06 NOTE — Telephone Encounter (Signed)
Left message to return call 

## 2021-11-10 ENCOUNTER — Telehealth (INDEPENDENT_AMBULATORY_CARE_PROVIDER_SITE_OTHER): Payer: Self-pay | Admitting: *Deleted

## 2021-11-10 NOTE — Telephone Encounter (Signed)
Patient walked in office and told me he Waited 2 hours today at lab to have liver profile drawn. Would like to know when results come in and had concerns about waiting til July 24th to see Dr. Meridee Score. ( I did tell patient to call his office and ask to get on cancellation list)   ?

## 2021-11-11 ENCOUNTER — Telehealth: Payer: Self-pay | Admitting: Gastroenterology

## 2021-11-11 LAB — HEPATIC FUNCTION PANEL
AG Ratio: 1.1 (calc) (ref 1.0–2.5)
ALT: 90 U/L — ABNORMAL HIGH (ref 9–46)
AST: 65 U/L — ABNORMAL HIGH (ref 10–35)
Albumin: 3.9 g/dL (ref 3.6–5.1)
Alkaline phosphatase (APISO): 410 U/L — ABNORMAL HIGH (ref 35–144)
Bilirubin, Direct: 0.4 mg/dL — ABNORMAL HIGH (ref 0.0–0.2)
Globulin: 3.6 g/dL (calc) (ref 1.9–3.7)
Indirect Bilirubin: 0.6 mg/dL (calc) (ref 0.2–1.2)
Total Bilirubin: 1 mg/dL (ref 0.2–1.2)
Total Protein: 7.5 g/dL (ref 6.1–8.1)

## 2021-11-11 NOTE — Telephone Encounter (Signed)
I did call and discussed with patient and he verbalized understanding. He did want me to call Dr. Elesa Hacker office and ask to be put on cancellation list to be moved up. I did call and ask for him to be added.  ?

## 2021-11-11 NOTE — Telephone Encounter (Signed)
Received a call from patient requesting to be put on the waiting list for an earlier procedure time with Dr. Meridee Score if one becomes available.  Please call and advise.  Thank you. ?

## 2021-11-11 NOTE — Telephone Encounter (Signed)
Per dr Karilyn Cota - liver profile is not normal but numbers are much better. Pt should go to [facility] if he starts to have abdominal pain and let ER doctor know to contact Dr. Meridee Score or someone in his group. ( He has appt July 24th with Dr. Meridee Score)  ?

## 2021-11-13 NOTE — Telephone Encounter (Signed)
The pt has been advised that he is on the wait list for any cancellation.

## 2021-11-17 ENCOUNTER — Telehealth: Payer: Self-pay | Admitting: Gastroenterology

## 2021-11-17 NOTE — Telephone Encounter (Signed)
I returned the pt call and spoke with the pt wife.  They would like to know if there have been any cancellations for hospital cases.  I have advised that there are no openings at this time. I will call the pt if we do get any cancellations for a sooner appt.

## 2021-11-17 NOTE — Telephone Encounter (Signed)
Patient's wife called to see if there had been any cancellations for his procedure with Dr. Meridee Score.  His wife says he needs this sooner, if at all possible.

## 2021-12-01 NOTE — Telephone Encounter (Signed)
Inbound call from patients wife, requesting a sooner appointment for patient. Patient is scheduled for upcoming procedure at [facility] 01/19/22. Please call patient back to advise.   Thank you

## 2021-12-01 NOTE — Telephone Encounter (Signed)
The pt has been advised that we do not have any sooner appts and he is still on My wait list.  I will call if we have any cancellations.

## 2022-01-12 ENCOUNTER — Encounter (HOSPITAL_COMMUNITY): Payer: Self-pay | Admitting: Gastroenterology

## 2022-01-19 ENCOUNTER — Ambulatory Visit (HOSPITAL_BASED_OUTPATIENT_CLINIC_OR_DEPARTMENT_OTHER): Payer: PPO | Admitting: Anesthesiology

## 2022-01-19 ENCOUNTER — Ambulatory Visit (HOSPITAL_COMMUNITY): Payer: PPO

## 2022-01-19 ENCOUNTER — Ambulatory Visit (HOSPITAL_COMMUNITY): Payer: PPO | Admitting: Anesthesiology

## 2022-01-19 ENCOUNTER — Encounter (HOSPITAL_COMMUNITY)
Admission: RE | Disposition: A | Discharge status: Home / Self Care | Payer: Self-pay | Source: Home / Self Care | Attending: Gastroenterology

## 2022-01-19 ENCOUNTER — Encounter (HOSPITAL_COMMUNITY): Payer: Self-pay | Admitting: Gastroenterology

## 2022-01-19 ENCOUNTER — Ambulatory Visit (HOSPITAL_COMMUNITY)
Admission: RE | Admit: 2022-01-19 | Discharge: 2022-01-19 | Disposition: A | Discharge status: Home / Self Care | Payer: PPO | Attending: Gastroenterology | Admitting: Gastroenterology

## 2022-01-19 ENCOUNTER — Other Ambulatory Visit: Payer: Self-pay

## 2022-01-19 DIAGNOSIS — I10 Essential (primary) hypertension: Secondary | ICD-10-CM | POA: Diagnosis not present

## 2022-01-19 DIAGNOSIS — K8050 Calculus of bile duct without cholangitis or cholecystitis without obstruction: Secondary | ICD-10-CM | POA: Diagnosis present

## 2022-01-19 DIAGNOSIS — K838 Other specified diseases of biliary tract: Secondary | ICD-10-CM | POA: Insufficient documentation

## 2022-01-19 DIAGNOSIS — D649 Anemia, unspecified: Secondary | ICD-10-CM

## 2022-01-19 DIAGNOSIS — K3189 Other diseases of stomach and duodenum: Secondary | ICD-10-CM | POA: Insufficient documentation

## 2022-01-19 DIAGNOSIS — Z9049 Acquired absence of other specified parts of digestive tract: Secondary | ICD-10-CM | POA: Insufficient documentation

## 2022-01-19 DIAGNOSIS — Z4659 Encounter for fitting and adjustment of other gastrointestinal appliance and device: Secondary | ICD-10-CM | POA: Diagnosis not present

## 2022-01-19 DIAGNOSIS — K2970 Gastritis, unspecified, without bleeding: Secondary | ICD-10-CM | POA: Insufficient documentation

## 2022-01-19 DIAGNOSIS — K828 Other specified diseases of gallbladder: Secondary | ICD-10-CM | POA: Diagnosis not present

## 2022-01-19 HISTORY — PX: REMOVAL OF STONES: SHX5545

## 2022-01-19 HISTORY — PX: BIOPSY: SHX5522

## 2022-01-19 HISTORY — PX: BILIARY STENT PLACEMENT: SHX5538

## 2022-01-19 HISTORY — PX: STENT REMOVAL: SHX6421

## 2022-01-19 HISTORY — PX: SPYGLASS CHOLANGIOSCOPY: SHX5441

## 2022-01-19 HISTORY — PX: ENDOSCOPIC RETROGRADE CHOLANGIOPANCREATOGRAPHY (ERCP) WITH PROPOFOL: SHX5810

## 2022-01-19 HISTORY — PX: STONE EXTRACTION WITH BASKET: SHX5318

## 2022-01-19 SURGERY — ENDOSCOPIC RETROGRADE CHOLANGIOPANCREATOGRAPHY (ERCP) WITH PROPOFOL
Anesthesia: General

## 2022-01-19 MED ORDER — DEXAMETHASONE SODIUM PHOSPHATE 10 MG/ML IJ SOLN
INTRAMUSCULAR | Status: DC | PRN
  Administered 2022-01-19: 10 mg via INTRAVENOUS

## 2022-01-19 MED ORDER — PHENYLEPHRINE 80 MCG/ML (10ML) SYRINGE FOR IV PUSH (FOR BLOOD PRESSURE SUPPORT)
PREFILLED_SYRINGE | INTRAVENOUS | Status: DC | PRN
  Administered 2022-01-19 (×3): 160 ug via INTRAVENOUS

## 2022-01-19 MED ORDER — FENTANYL CITRATE (PF) 100 MCG/2ML IJ SOLN
INTRAMUSCULAR | Status: AC
  Filled 2022-01-19: qty 2

## 2022-01-19 MED ORDER — ROCURONIUM BROMIDE 10 MG/ML (PF) SYRINGE
PREFILLED_SYRINGE | INTRAVENOUS | Status: DC | PRN
  Administered 2022-01-19: 10 mg via INTRAVENOUS
  Administered 2022-01-19: 50 mg via INTRAVENOUS
  Administered 2022-01-19: 20 mg via INTRAVENOUS

## 2022-01-19 MED ORDER — GLUCAGON HCL RDNA (DIAGNOSTIC) 1 MG IJ SOLR
INTRAMUSCULAR | Status: DC | PRN
  Administered 2022-01-19 (×4): .25 mg via INTRAVENOUS

## 2022-01-19 MED ORDER — SODIUM CHLORIDE 0.9 % IV SOLN
INTRAVENOUS | Status: DC | PRN
  Administered 2022-01-19: 44 mL

## 2022-01-19 MED ORDER — CIPROFLOXACIN IN D5W 400 MG/200ML IV SOLN
INTRAVENOUS | Status: DC | PRN
  Administered 2022-01-19: 400 mg via INTRAVENOUS

## 2022-01-19 MED ORDER — PHENYLEPHRINE HCL (PRESSORS) 10 MG/ML IV SOLN
INTRAVENOUS | Status: AC
  Filled 2022-01-19: qty 1

## 2022-01-19 MED ORDER — LIDOCAINE 2% (20 MG/ML) 5 ML SYRINGE
INTRAMUSCULAR | Status: DC | PRN
  Administered 2022-01-19: 60 mg via INTRAVENOUS

## 2022-01-19 MED ORDER — PROPOFOL 10 MG/ML IV BOLUS
INTRAVENOUS | Status: DC | PRN
  Administered 2022-01-19: 20 mg via INTRAVENOUS
  Administered 2022-01-19: 90 mg via INTRAVENOUS

## 2022-01-19 MED ORDER — DICLOFENAC SUPPOSITORY 100 MG
RECTAL | Status: DC | PRN
  Administered 2022-01-19: 100 mg via RECTAL

## 2022-01-19 MED ORDER — CIPROFLOXACIN HCL 500 MG PO TABS: 500.0000 mg | ORAL_TABLET | Freq: Two times a day (BID) | ORAL | 0 refills | Status: AC

## 2022-01-19 MED ORDER — SODIUM CHLORIDE 0.9 % IV SOLN: INTRAVENOUS | Status: DC

## 2022-01-19 MED ORDER — ONDANSETRON HCL 4 MG/2ML IJ SOLN
INTRAMUSCULAR | Status: DC | PRN
  Administered 2022-01-19: 4 mg via INTRAVENOUS

## 2022-01-19 MED ORDER — LACTATED RINGERS IV SOLN
INTRAVENOUS | Status: DC
  Administered 2022-01-19: 1000 mL via INTRAVENOUS

## 2022-01-19 MED ORDER — PROPOFOL 10 MG/ML IV BOLUS
INTRAVENOUS | Status: AC
  Filled 2022-01-19: qty 20

## 2022-01-19 MED ORDER — CIPROFLOXACIN IN D5W 400 MG/200ML IV SOLN
INTRAVENOUS | Status: AC
  Filled 2022-01-19: qty 200

## 2022-01-19 MED ORDER — DICLOFENAC SUPPOSITORY 100 MG
RECTAL | Status: AC
  Filled 2022-01-19: qty 1

## 2022-01-19 MED ORDER — SUGAMMADEX SODIUM 200 MG/2ML IV SOLN
INTRAVENOUS | Status: DC | PRN
  Administered 2022-01-19: 200 mg via INTRAVENOUS

## 2022-01-19 MED ORDER — GLUCAGON HCL RDNA (DIAGNOSTIC) 1 MG IJ SOLR
INTRAMUSCULAR | Status: AC
  Filled 2022-01-19: qty 2

## 2022-01-19 MED ORDER — PHENYLEPHRINE HCL-NACL 20-0.9 MG/250ML-% IV SOLN
INTRAVENOUS | Status: DC | PRN
  Administered 2022-01-19: 50 ug/min via INTRAVENOUS

## 2022-01-19 MED ORDER — FENTANYL CITRATE (PF) 100 MCG/2ML IJ SOLN
INTRAMUSCULAR | Status: DC | PRN
Start: 2022-01-19 — End: 2022-01-19
  Administered 2022-01-19 (×2): 50 ug via INTRAVENOUS

## 2022-01-19 NOTE — Anesthesia Preprocedure Evaluation (Signed)
Anesthesia Evaluation  Patient identified by MRN, date of birth, ID band Patient awake    Reviewed: Allergy & Precautions, H&P , NPO status , Patient's Chart, lab work & pertinent test results, reviewed documented beta blocker date and time   History of Anesthesia Complications Negative for: history of anesthetic complications  Airway Mallampati: III  TM Distance: <3 FB Neck ROM: full    Dental  (+) Missing, Dental Advisory Given   Pulmonary neg pulmonary ROS,    breath sounds clear to auscultation       Cardiovascular hypertension, Pt. on medications (-) angina(-) Past MI and (-) CHF  Rhythm:Regular     Neuro/Psych negative neurological ROS  negative psych ROS   GI/Hepatic negative GI ROS, Lab Results      Component                Value               Date                      ALT                      90 (H)              11/10/2021                AST                      65 (H)              11/10/2021                ALKPHOS                  580 (H)             10/27/2021                BILITOT                  1.0                 11/10/2021              Endo/Other  negative endocrine ROS  Renal/GU negative Renal ROS     Musculoskeletal negative musculoskeletal ROS (+)   Abdominal   Peds  Hematology  (+) Blood dyscrasia, anemia , Lab Results      Component                Value               Date                      WBC                      10.6 (H)            10/27/2021                HGB                      12.0 (L)            10/27/2021                HCT  37.8 (L)            10/27/2021                MCV                      99.0                10/27/2021                PLT                      211                 10/27/2021              Anesthesia Other Findings   Reproductive/Obstetrics                            Anesthesia Physical Anesthesia Plan  ASA:  3  Anesthesia Plan: General   Post-op Pain Management: Minimal or no pain anticipated   Induction: Intravenous  PONV Risk Score and Plan: 2 and Ondansetron and Dexamethasone  Airway Management Planned: Oral ETT  Additional Equipment: None  Intra-op Plan:   Post-operative Plan: Extubation in OR  Informed Consent: I have reviewed the patients History and Physical, chart, labs and discussed the procedure including the risks, benefits and alternatives for the proposed anesthesia with the patient or authorized representative who has indicated his/her understanding and acceptance.     Dental advisory given  Plan Discussed with: CRNA  Anesthesia Plan Comments:         Anesthesia Quick Evaluation

## 2022-01-19 NOTE — Anesthesia Procedure Notes (Signed)
Procedure Name: Intubation Date/Time: 01/19/2022 7:57 AM  Performed by: Lind Covert, CRNAPre-anesthesia Checklist: Patient identified, Emergency Drugs available, Suction available, Patient being monitored and Timeout performed Patient Re-evaluated:Patient Re-evaluated prior to induction Oxygen Delivery Method: Circle system utilized Preoxygenation: Pre-oxygenation with 100% oxygen Induction Type: IV induction Ventilation: Mask ventilation without difficulty Laryngoscope Size: Mac and 3 Grade View: Grade I Tube type: Oral Tube size: 7.5 mm Number of attempts: 1 Airway Equipment and Method: Stylet Placement Confirmation: ETT inserted through vocal cords under direct vision, positive ETCO2 and breath sounds checked- equal and bilateral Secured at: 23 cm Tube secured with: Tape Dental Injury: Teeth and Oropharynx as per pre-operative assessment

## 2022-01-19 NOTE — Discharge Instructions (Signed)
YOU HAD AN ENDOSCOPIC PROCEDURE TODAY: Refer to the procedure report and other information in the discharge instructions given to you for any specific questions about what was found during the examination. If this information does not answer your questions, please call [place] office at [phone] to clarify.   YOU SHOULD EXPECT: Some feelings of bloating in the abdomen. Passage of more gas than usual. Walking can help get rid of the air that was put into your GI tract during the procedure and reduce the bloating. If you had a lower endoscopy (such as a colonoscopy or flexible sigmoidoscopy) you may notice spotting of blood in your stool or on the toilet paper. Some abdominal soreness may be present for a day or two, also.  DIET: Your first meal following the procedure should be a light meal and then it is ok to progress to your normal diet. A half-sandwich or bowl of soup is an example of a good first meal. Heavy or fried foods are harder to digest and may make you feel nauseous or bloated. Drink plenty of fluids but you should avoid alcoholic beverages for 24 hours. If you had a esophageal dilation, please see attached instructions for diet.    ACTIVITY: Your care partner should take you home directly after the procedure. You should plan to take it easy, moving slowly for the rest of the day. You can resume normal activity the day after the procedure however YOU SHOULD NOT DRIVE, use power tools, machinery or perform tasks that involve climbing or major physical exertion for 24 hours (because of the sedation medicines used during the test).   SYMPTOMS TO REPORT IMMEDIATELY: A gastroenterologist can be reached at any hour. Please call [phone]  for any of the following symptoms:   Following upper endoscopy (EGD, EUS, ERCP, esophageal dilation) Vomiting of blood or coffee ground material  New, significant abdominal pain  New, significant chest pain or pain under the shoulder blades  Painful or  persistently difficult swallowing  New shortness of breath  Black, tarry-looking or red, bloody stools  FOLLOW UP:  If any biopsies were taken you will be contacted by phone or by letter within the next 1-3 weeks. Call [phone]  if you have not heard about the biopsies in 3 weeks.  Please also call with any specific questions about appointments or follow up tests.  

## 2022-01-19 NOTE — Op Note (Addendum)
[facility] Patient Name: Dean Randolph Procedure Date: 01/19/2022 MRN: [id number] Attending MD: Justice Britain , MD Date of Birth: [birth year] CSN: [id number] Age: 81 Admit Type: Outpatient Procedure:                ERCP Indications:              Bile duct stone(s), Biliary stent removal Providers:                Justice Britain, MD, Burtis Junes, RN, Luan Moore, Technician, Derrek Gu. Alday CRNA, CRNA Referring MD:              Medicines:                General Anesthesia, Cipro 400 mg IV, Diclofenac 100                            mg rectal, Glucagon 1 mg IV Complications:            No immediate complications. Estimated Blood Loss:     Estimated blood loss was minimal. Procedure:                Pre-Anesthesia Assessment:                           - Prior to the procedure, a History and Physical                            was performed, and patient medications and                            allergies were reviewed. The patient's tolerance of                            previous anesthesia was also reviewed. The risks                            and benefits of the procedure and the sedation                            options and risks were discussed with the patient.                            All questions were answered, and informed consent                            was obtained. Prior Anticoagulants: The patient has                            taken no previous anticoagulant or antiplatelet                            agents. ASA Grade Assessment: III - A patient with  severe systemic disease. After reviewing the risks                            and benefits, the patient was deemed in                            satisfactory condition to undergo the procedure.                           After obtaining informed consent, the scope was                            passed under direct vision. Throughout the                             procedure, the patient's blood pressure, pulse, and                            oxygen saturations were monitored continuously. The                            [id number]-Q190V ([id number]) Olympus duodenoscope was                            introduced through the mouth, and used to inject                            contrast into and used for direct visualization of                            the bile duct. The ERCP was unusually difficult due                            to a large stone. Successful completion of the                            procedure was aided by performing the maneuvers                            documented (below) in this report. The patient                            tolerated the procedure. Scope In: Scope Out: Findings:      A scout film of the abdomen was obtained. Surgical clips, consistent       with previous cholecystectomy, were seen in the area of the right upper       quadrant of the abdomen. One stent ending in the main bile duct was seen.      The scope was passed under direct vision through the upper GI tract. A       J-shaped deformity was found of the stomach. Scattered mild inflammation       characterized by erosions and erythema was found in the entire examined       stomach - biopsied for HP  evaluation. No gross lesions were noted in the       duodenal bulb, in the first portion of the duodenum and in the second       portion of the duodenum. The major papilla was adjacent to two       diverticulae. The major papilla was congested. A biliary sphincterotomy       had been performed. The sphincterotomy appeared open. One plastic       biliary stent originating in the biliary tree was emerging from the       major papilla. One stent was removed from the biliary tree using a snare.      The bile duct could not be cannulated with the Hydratome sphincterotome       initially. Preferential wire placement was going into the low-lying       cystic  duct. We noted a long cystic duct stump originated in the in the       biliary system. After further manipulation and tightening of the       sphincterotome, a short 0.035 inch Soft Jagwire was able to be passed       into the biliary tree. The Hydratome sphincterotome was passed over the       guidewire and the bile duct was then deeply cannulated. Contrast was       injected. I personally interpreted the bile duct images. Ductal flow of       contrast was adequate. Image quality was adequate. Contrast extended to       the hepatic ducts. Opacification of the entire biliary tree except for       the gallbladder was successful. The main bile duct and cystic duct       contained filling defects thought to be stones and sludge. The upper       third of the main bile duct and cystic duct were dilated each. The       biliary tree was swept with a retrieval balloon starting at the lower       third of the main duct. Sludge was swept from the duct. A few stones       were removed. Multiple stones remained. Dilation of the common bile duct       with an 02-04-09 mm balloon (to a maximum balloon size of 10 mm) dilator       was successful as a sphincteroplasty was performed for 3 minutes. The       biliary tree was swept with a retrieval balloon. Sludge was swept from       the duct. A few stones were removed. Multiple stones remained.       Lithotripsy with a 2.0 cm and 2.5 cm basket-type devices was successful       partially. The biliary tree was swept with a retrieval balloon. Sludge       was swept from the duct. Multiple stone fragments were removed. A few       stones remained. Unfortunately, we lost access due to positioning. For       over 45 minutes, we could not reaccess the proximal biliary tree due to       the low-lying cystic duct. The bile duct could not be cannulated with       the Hydratome sphincterotome and Revolution Jagtome sphincterotome. We       left a short 0.025 inch  Revolution Antonietta Breach was passed in the cystic  duct. The biliary tree was swept with a retrieval balloon starting at       the cystic duct. Sludge was swept from the duct. A few stones were       removed. Multiple stone fragments and debris were present on       cysticductogram. The bile duct could not be cannulated. We decided to       attempt selective cannulation by using Spyscope and using the cystic       duct wire to find the other orifice. The cystic duct and distal bile       duct was explored endoscopically using the SpyGlass direct visualization       system. The SpyScope was advanced to the cystic duct. Visibility with       the scope was fair. The cystic duct contained multiple stone fragments       and debris still present and was dilated. We found what appeared to be       the lumen of the lower third of the main bile duct and it was narrowed.       A long 0.035 inch Soft Jagwire was then able to be selectively       cannulated into the proximal biliary tree. Due to the overall time of       today's procedure, decision made to replace biliary stenting to decrease       risk of post-infectious complications and for further need of biliary       and attempt at cystic duct cleanout (this seems to be a more       [place] presentation of the way the cystic duct seems to be       engaging the distal bile duct. One 8.5 Fr by 7 cm plastic biliary stent       with a single external flap and a single internal flap was placed into       the common bile duct. Bile flowed through the stent. The stent was in       good position.      A pancreatogram was not performed.      The duodenoscope was withdrawn from the patient. Impression:               - The patient has had a cholecystectomy.                           - J-shaped deformity of stomach. Gastritis -                            biopsied.                           - No gross lesions in the duodenal bulb, in the                             first portion of the duodenum and in the second                            portion of the duodenum.                           - Biliary stent removed. Prior sphincterotomy  present.                           - Difficult cannulation of the proximal biliary                            tree due to low-lying cystic duct.                           - The cystic duct and upper third of the main bile                            duct were dilated.                           - Filling defects consistent with stones and sludge                            were seen on the cholangiogram in the bile duct and                            the cystic duct.                           - Choledocholithiasis was found in biliary tree.                            Sphincteroplasty performed. [place]                            performed but did not remove all the stones.                            Mechanical lithotripsy was performed but not                            completely able to remove all stone debris.                           - Access lost and eventually had to use SpyGlass to                            allow repeat attempt at biliary cannulation (as                            documented above).                           - Significant improvement in the bile duct                            debris/stone burden but there is still a                            significant amount of debris in the  cystic duct                            stump.                           - One plastic biliary stent was placed into the                            common bile duct.                           - Procedure completed. Moderate Sedation:      Not Applicable - Patient had care per Anesthesia. Recommendation:           - The patient will be observed post-procedure,                            until all discharge criteria are met.                           - Discharge patient to  home.                           - Patient has a contact number available for                            emergencies. The signs and symptoms of potential                            delayed complications were discussed with the                            patient. Return to normal activities tomorrow.                            Written discharge instructions were provided to the                            patient.                           - Low fat diet for 1 week.                           - Watch for pancreatitis, bleeding, perforation,                            and cholangitis.                           - Observe patient's clinical course.                           - Check liver enzymes (AST, ALT, alkaline  phosphatase, bilirubin) in 2 weeks.                           - Repeat ERCP in 6 weeks.                           - Continue present medications.                           - Watch for pancreatitis, bleeding, perforation,                            and cholangitis.                           - The findings and recommendations were discussed                            with the patient.                           - The findings and recommendations were discussed                            with the patient's family.                           - Cipro (ciprofloxacin) 500 mg PO BID for 3 days. Procedure Code(s):        --- Professional ---                           [phone], Endoscopic retrograde                            cholangiopancreatography (ERCP); with removal and                            exchange of stent(s), biliary or pancreatic duct,                            including pre- and post-dilation and guide wire                            passage, when performed, including sphincterotomy,                            when performed, each stent exchanged                           [id number], Endoscopic retrograde                            cholangiopancreatography  (ERCP); with destruction                            of calculi, any method (eg, mechanical,  electrohydraulic, lithotripsy)                           [id number], Endoscopic cannulation of papilla with                            direct visualization of pancreatic/common bile                            duct(s) (List separately in addition to code(s) for                            primary procedure) Diagnosis Code(s):        --- Professional ---                           K31.89, Other diseases of stomach and duodenum                           K29.70, Gastritis, unspecified, without bleeding                           R93.2, Abnormal findings on diagnostic imaging of                            liver and biliary tract                           K82.8, Other specified diseases of gallbladder                           K83.8, Other specified diseases of biliary tract                           Z90.49, Acquired absence of other specified parts                            of digestive tract                           K80.50, Calculus of bile duct without cholangitis                            or cholecystitis without obstruction                           Z46.59, Encounter for fitting and adjustment of                            other gastrointestinal appliance and device CPT copyright 2019 [employer]. All rights reserved. The codes documented in this report are preliminary and upon coder review may  be revised to meet current compliance requirements. Justice Britain, MD 01/19/2022 11:06:03 AM Number of Addenda: 0

## 2022-01-19 NOTE — H&P (Signed)
GASTROENTEROLOGY PROCEDURE H&P NOTE   Primary Care Physician: Mechele Claude, MD  HPI: Dean Randolph is a 81 y.o. male who presents for ERCP for stent removal, choledocholithiasis.  Past Medical History:  Diagnosis Date   Hypertension    Past Surgical History:  Procedure Laterality Date   BILIARY STENT PLACEMENT N/A 02/06/2021   Procedure: BILIARY STENT PLACEMENT;  Surgeon: Malissa Hippo, MD;  Location: [facility];  Service: Gastroenterology;  Laterality: N/A;   BILIARY STENT PLACEMENT N/A 03/26/2021   Procedure: BILIARY STENT PLACEMENT 10 FRENCH BY 9cm;  Surgeon: Malissa Hippo, MD;  Location: [facility];  Service: Endoscopy;  Laterality: N/A;   CATARACT EXTRACTION Bilateral    CHOLECYSTECTOMY N/A 04/25/2021   Procedure: LAPAROSCOPIC CHOLECYSTECTOMY;  Surgeon: [name], MD;  Location: [facility];  Service: General;  Laterality: N/A;   ERCP N/A 02/06/2021   Procedure: ENDOSCOPIC RETROGRADE CHOLANGIOPANCREATOGRAPHY (ERCP);  Surgeon: Malissa Hippo, MD;  Location: [facility];  Service: Gastroenterology;  Laterality: N/A;   ERCP N/A 03/26/2021   Procedure: ENDOSCOPIC RETROGRADE CHOLANGIOPANCREATOGRAPHY (ERCP) with EXTENDED SPHINCTEROTOMY;  Surgeon: Malissa Hippo, MD;  Location: [facility];  Service: Endoscopy;  Laterality: N/A;   ERCP N/A 10/27/2021   Procedure: ENDOSCOPIC RETROGRADE CHOLANGIOPANCREATOGRAPHY (ERCP);  Surgeon: Malissa Hippo, MD;  Location: [facility];  Service: Endoscopy;  Laterality: N/A;  no site    GASTROINTESTINAL STENT REMOVAL N/A 03/26/2021   Procedure: GASTROINTESTINAL STENT REMOVAL;  Surgeon: Malissa Hippo, MD;  Location: [facility];  Service: Endoscopy;  Laterality: N/A;   GASTROINTESTINAL STENT REMOVAL N/A 10/27/2021   Procedure: GASTROINTESTINAL STENT EXCHANGE;  Surgeon: Malissa Hippo, MD;  Location: [facility];  Service: Endoscopy;  Laterality: N/A;  no site    REMOVAL OF STONES N/A 03/26/2021   Procedure: REMOVAL OF STONES WITH EXTRACTION BASKET;  Surgeon: Malissa Hippo, MD;  Location: [facility];  Service: Endoscopy;  Laterality: N/A;   REMOVAL OF STONES N/A 10/27/2021   Procedure: REMOVAL OF STONES;  Surgeon: Malissa Hippo, MD;  Location: [facility];  Service: Endoscopy;  Laterality: N/A;   SPHINCTEROTOMY N/A 02/06/2021   Procedure: SPHINCTEROTOMY;  Surgeon: Malissa Hippo, MD;  Location: [facility];  Service: Gastroenterology;  Laterality: N/A;   SPHINCTEROTOMY N/A 10/27/2021   Procedure: SPHINCTEROTOMY;  Surgeon: Malissa Hippo, MD;  Location: [facility];  Service: Endoscopy;  Laterality: N/A;   Current Facility-Administered Medications  Medication Dose Route Frequency Provider Last Rate Last Admin   0.9 %  sodium chloride infusion   Intravenous Continuous Mansouraty, Netty Starring., MD       lactated ringers infusion   Intravenous Continuous Mansouraty, Netty Starring., MD 10 mL/hr at 01/19/22 7425 Continued from Pre-op at 01/19/22 0712    Current Facility-Administered Medications:    0.9 %  sodium chloride infusion, , Intravenous, Continuous, Mansouraty, Netty Starring., MD   lactated ringers infusion, , Intravenous, Continuous, Mansouraty, Netty Starring., MD, Last Rate: 10 mL/hr at 01/19/22 9563, Continued from Pre-op at 01/19/22 0712 No Known Allergies Family History  Problem Relation Age of Onset   Stroke Mother    Social History   Socioeconomic History   Marital status: Married    Spouse name: Not on file   Number of children: Not on file   Years of education: Not on file   Highest education level: Not on file  Occupational History   Occupation: electrician    Comment: retired  Tobacco Use   Smoking status: Never    Passive  exposure: Past   Smokeless tobacco: Never  Vaping Use   Vaping Use: Never used  Substance and Sexual Activity   Alcohol use: Never   Drug use: Never   Sexual activity: Yes  Other Topics Concern   Not on file  Social History Narrative   Not on file   Social Determinants of Health   Financial Resource Strain: Not on file   Food Insecurity: Not on file  Transportation Needs: Not on file  Physical Activity: Not on file  Stress: Not on file  Social Connections: Not on file  Intimate Partner Violence: Not on file    Physical Exam: Today's Vitals   01/19/22 0627  BP: (!) 174/74  Pulse: 75  Resp: 18  Temp: (!) 97.5 F (36.4 C)  TempSrc: Tympanic  SpO2: 100%  Weight: 72.6 kg  Height: 5\' 6"  (1.676 m)  PainSc: 0-No pain   Body mass index is 25.82 kg/m. GEN: NAD EYE: Sclerae anicteric ENT: MMM CV: Non-tachycardic GI: Soft, NT/ND NEURO:  Alert & Oriented x 3  Lab Results: No results for input(s): "WBC", "HGB", "HCT", "PLT" in the last 72 hours. BMET No results for input(s): "NA", "K", "CL", "CO2", "GLUCOSE", "BUN", "CREATININE", "CALCIUM" in the last 72 hours. LFT No results for input(s): "PROT", "ALBUMIN", "AST", "ALT", "ALKPHOS", "BILITOT", "BILIDIR", "IBILI" in the last 72 hours. PT/INR No results for input(s): "LABPROT", "INR" in the last 72 hours.   Impression / Plan: This is a 81 y.o.male who presents for ERCP for stent removal, choledocholithiasis.  The risks of an ERCP were discussed at length, including but not limited to the risk of perforation, bleeding, abdominal pain, post-ERCP pancreatitis (while usually mild can be severe and even life threatening).  The risks and benefits of endoscopic evaluation/treatment were discussed with the patient and/or family; these include but are not limited to the risk of perforation, infection, bleeding, missed lesions, lack of diagnosis, severe illness requiring hospitalization, as well as anesthesia and sedation related illnesses.  The patient's history has been reviewed, patient examined, no change in status, and deemed stable for procedure.  The patient and/or family is agreeable to proceed.    77, MD [facility] # [place]

## 2022-01-19 NOTE — Transfer of Care (Signed)
Immediate Anesthesia Transfer of Care Note  Patient: Dean Randolph  Procedure(s) Performed: ENDOSCOPIC RETROGRADE CHOLANGIOPANCREATOGRAPHY (ERCP) WITH PROPOFOL SPYGLASS CHOLANGIOSCOPY STONE EXTRACTION WITH BASKET REMOVAL OF STONES STENT REMOVAL BIOPSY BILIARY STENT PLACEMENT  Patient Location: PACU  Anesthesia Type:General  Level of Consciousness: sedated  Airway & Oxygen Therapy: Patient Spontanous Breathing and Patient connected to face mask oxygen  Post-op Assessment: Report given to RN and Post -op Vital signs reviewed and stable  Post vital signs: Reviewed and stable  Last Vitals:  Vitals Value Taken Time  BP    Temp    Pulse 57 01/19/22 1044  Resp 11 01/19/22 1044  SpO2 100 % 01/19/22 1044  Vitals shown include unvalidated device data.  Last Pain:  Vitals:   01/19/22 0627  TempSrc: Tympanic  PainSc: 0-No pain         Complications: No notable events documented.

## 2022-01-20 ENCOUNTER — Encounter: Payer: Self-pay | Admitting: Gastroenterology

## 2022-01-20 ENCOUNTER — Telehealth: Payer: Self-pay

## 2022-01-20 LAB — SURGICAL PATHOLOGY

## 2022-01-20 NOTE — Telephone Encounter (Signed)
Please offer the patient a repeat ERCP 2-hour case for further attempt at biliary stone removal and stent exchange.  If patient/patient's wife want to be seen in follow-up that will be fine you can set that up.  He needs a repeat set of liver tests in 2 weeks.  Thanks.

## 2022-01-21 ENCOUNTER — Encounter (HOSPITAL_COMMUNITY): Payer: Self-pay | Admitting: Gastroenterology

## 2022-01-21 NOTE — Anesthesia Postprocedure Evaluation (Signed)
Anesthesia Post Note  Patient: Dean Randolph  Procedure(s) Performed: ENDOSCOPIC RETROGRADE CHOLANGIOPANCREATOGRAPHY (ERCP) WITH PROPOFOL SPYGLASS CHOLANGIOSCOPY STONE EXTRACTION WITH BASKET REMOVAL OF STONES STENT REMOVAL BIOPSY BILIARY STENT PLACEMENT     Patient location during evaluation: Endoscopy Anesthesia Type: General Level of consciousness: awake and alert Pain management: pain level controlled Vital Signs Assessment: post-procedure vital signs reviewed and stable Respiratory status: spontaneous breathing, nonlabored ventilation and respiratory function stable Cardiovascular status: blood pressure returned to baseline and stable Postop Assessment: no apparent nausea or vomiting Anesthetic complications: no   No notable events documented.  Last Vitals:  Vitals:   01/19/22 1110 01/19/22 1120  BP: 126/69 134/81  Pulse:  60  Resp: 17 17  Temp:    SpO2:  100%    Last Pain:  Vitals:   01/19/22 1120  TempSrc:   PainSc: 0-No pain                 Dyann Goodspeed

## 2022-01-22 ENCOUNTER — Other Ambulatory Visit: Payer: Self-pay

## 2022-01-22 DIAGNOSIS — K8050 Calculus of bile duct without cholangitis or cholecystitis without obstruction: Secondary | ICD-10-CM

## 2022-01-22 NOTE — Telephone Encounter (Signed)
ERCP has been scheduled for 03/12/22 at 730 am at [facility] with GM   Left message on machine to call back   Pt needs lab in 2 weeks.  Order entered

## 2022-01-23 NOTE — Telephone Encounter (Signed)
ERCP  scheduled, pt instructed and medications reviewed.  Patient instructions mailed to home.  Patient to call with any questions or concerns.   The pt has also been advised to come in for labs in 2 weeks.

## 2022-02-11 ENCOUNTER — Other Ambulatory Visit (INDEPENDENT_AMBULATORY_CARE_PROVIDER_SITE_OTHER): Payer: PPO

## 2022-02-11 DIAGNOSIS — K8050 Calculus of bile duct without cholangitis or cholecystitis without obstruction: Secondary | ICD-10-CM | POA: Diagnosis not present

## 2022-02-11 LAB — HEPATIC FUNCTION PANEL
ALT: 23 U/L (ref 0–53)
AST: 27 U/L (ref 0–37)
Albumin: 3.8 g/dL (ref 3.5–5.2)
Alkaline Phosphatase: 218 U/L — ABNORMAL HIGH (ref 39–117)
Bilirubin, Direct: 0.2 mg/dL (ref 0.0–0.3)
Total Bilirubin: 0.5 mg/dL (ref 0.2–1.2)
Total Protein: 7.4 g/dL (ref 6.0–8.3)

## 2022-03-05 ENCOUNTER — Encounter (HOSPITAL_COMMUNITY): Payer: Self-pay | Admitting: Gastroenterology

## 2022-03-11 ENCOUNTER — Encounter (HOSPITAL_COMMUNITY): Payer: Self-pay | Admitting: Gastroenterology

## 2022-03-11 NOTE — Anesthesia Preprocedure Evaluation (Addendum)
Anesthesia Evaluation  Patient identified by MRN, date of birth, ID band Patient awake    Airway Mallampati: II  TM Distance: >3 FB Neck ROM: Full    Dental  (+) Missing, Chipped,    Pulmonary neg pulmonary ROS,    Pulmonary exam normal breath sounds clear to auscultation       Cardiovascular hypertension, Pt. on medications Normal cardiovascular exam Rhythm:Regular Rate:Normal     Neuro/Psych negative neurological ROS  negative psych ROS   GI/Hepatic Elevated LFT's Choledocholithiasis S/P biliary pancreatitis   Endo/Other  negative endocrine ROS  Renal/GU negative Renal ROS  negative genitourinary   Musculoskeletal negative musculoskeletal ROS (+)   Abdominal   Peds  Hematology  (+) Blood dyscrasia, anemia ,   Anesthesia Other Findings   Reproductive/Obstetrics                            Anesthesia Physical Anesthesia Plan  ASA: 2  Anesthesia Plan: General   Post-op Pain Management: Minimal or no pain anticipated   Induction: Intravenous and Cricoid pressure planned  PONV Risk Score and Plan: 3 and Treatment may vary due to age or medical condition and Ondansetron  Airway Management Planned: Oral ETT  Additional Equipment: None  Intra-op Plan:   Post-operative Plan: Extubation in OR  Informed Consent: I have reviewed the patients History and Physical, chart, labs and discussed the procedure including the risks, benefits and alternatives for the proposed anesthesia with the patient or authorized representative who has indicated his/her understanding and acceptance.     Dental advisory given  Plan Discussed with: CRNA and Anesthesiologist  Anesthesia Plan Comments:        Anesthesia Quick Evaluation

## 2022-03-12 ENCOUNTER — Encounter (HOSPITAL_COMMUNITY): Payer: Self-pay | Admitting: Gastroenterology

## 2022-03-12 ENCOUNTER — Ambulatory Visit (HOSPITAL_COMMUNITY): Payer: PPO | Admitting: Anesthesiology

## 2022-03-12 ENCOUNTER — Telehealth: Payer: Self-pay

## 2022-03-12 ENCOUNTER — Other Ambulatory Visit: Payer: Self-pay

## 2022-03-12 ENCOUNTER — Ambulatory Visit (HOSPITAL_BASED_OUTPATIENT_CLINIC_OR_DEPARTMENT_OTHER): Payer: PPO | Admitting: Anesthesiology

## 2022-03-12 ENCOUNTER — Ambulatory Visit (HOSPITAL_COMMUNITY)
Admission: RE | Admit: 2022-03-12 | Discharge: 2022-03-12 | Disposition: A | Discharge status: Home / Self Care | Payer: PPO | Attending: Gastroenterology | Admitting: Gastroenterology

## 2022-03-12 ENCOUNTER — Encounter (HOSPITAL_COMMUNITY)
Admission: RE | Disposition: A | Discharge status: Home / Self Care | Payer: Self-pay | Source: Home / Self Care | Attending: Gastroenterology

## 2022-03-12 ENCOUNTER — Ambulatory Visit (HOSPITAL_COMMUNITY): Payer: PPO

## 2022-03-12 DIAGNOSIS — D649 Anemia, unspecified: Secondary | ICD-10-CM | POA: Diagnosis not present

## 2022-03-12 DIAGNOSIS — Z79899 Other long term (current) drug therapy: Secondary | ICD-10-CM | POA: Insufficient documentation

## 2022-03-12 DIAGNOSIS — I10 Essential (primary) hypertension: Secondary | ICD-10-CM

## 2022-03-12 DIAGNOSIS — Z9049 Acquired absence of other specified parts of digestive tract: Secondary | ICD-10-CM | POA: Diagnosis not present

## 2022-03-12 DIAGNOSIS — R7989 Other specified abnormal findings of blood chemistry: Secondary | ICD-10-CM | POA: Diagnosis not present

## 2022-03-12 DIAGNOSIS — Z7722 Contact with and (suspected) exposure to environmental tobacco smoke (acute) (chronic): Secondary | ICD-10-CM

## 2022-03-12 DIAGNOSIS — K838 Other specified diseases of biliary tract: Secondary | ICD-10-CM | POA: Diagnosis not present

## 2022-03-12 DIAGNOSIS — K8050 Calculus of bile duct without cholangitis or cholecystitis without obstruction: Secondary | ICD-10-CM | POA: Insufficient documentation

## 2022-03-12 DIAGNOSIS — K5710 Diverticulosis of small intestine without perforation or abscess without bleeding: Secondary | ICD-10-CM | POA: Insufficient documentation

## 2022-03-12 DIAGNOSIS — Z4659 Encounter for fitting and adjustment of other gastrointestinal appliance and device: Secondary | ICD-10-CM | POA: Diagnosis not present

## 2022-03-12 HISTORY — PX: REMOVAL OF STONES: SHX5545

## 2022-03-12 HISTORY — PX: SPYGLASS CHOLANGIOSCOPY: SHX5441

## 2022-03-12 HISTORY — PX: SPYGLASS LITHOTRIPSY: SHX5537

## 2022-03-12 HISTORY — PX: BILIARY DILATION: SHX6850

## 2022-03-12 HISTORY — PX: ENDOSCOPIC RETROGRADE CHOLANGIOPANCREATOGRAPHY (ERCP) WITH PROPOFOL: SHX5810

## 2022-03-12 HISTORY — PX: STENT REMOVAL: SHX6421

## 2022-03-12 SURGERY — ENDOSCOPIC RETROGRADE CHOLANGIOPANCREATOGRAPHY (ERCP) WITH PROPOFOL
Anesthesia: General

## 2022-03-12 MED ORDER — SODIUM CHLORIDE 0.9 % IV SOLN
INTRAVENOUS | Status: DC | PRN
  Administered 2022-03-12: 100 mL

## 2022-03-12 MED ORDER — CIPROFLOXACIN IN D5W 400 MG/200ML IV SOLN
INTRAVENOUS | Status: DC | PRN
  Administered 2022-03-12: 400 mg via INTRAVENOUS

## 2022-03-12 MED ORDER — SUCCINYLCHOLINE CHLORIDE 200 MG/10ML IV SOSY
PREFILLED_SYRINGE | INTRAVENOUS | Status: DC | PRN
  Administered 2022-03-12: 100 mg via INTRAVENOUS

## 2022-03-12 MED ORDER — FENTANYL CITRATE (PF) 100 MCG/2ML IJ SOLN
INTRAMUSCULAR | Status: AC
  Filled 2022-03-12: qty 2

## 2022-03-12 MED ORDER — DICLOFENAC SUPPOSITORY 100 MG
RECTAL | Status: AC
  Filled 2022-03-12: qty 1

## 2022-03-12 MED ORDER — ONDANSETRON HCL 4 MG/2ML IJ SOLN
INTRAMUSCULAR | Status: DC | PRN
  Administered 2022-03-12: 4 mg via INTRAVENOUS

## 2022-03-12 MED ORDER — EPHEDRINE SULFATE (PRESSORS) 50 MG/ML IJ SOLN
INTRAMUSCULAR | Status: DC | PRN
  Administered 2022-03-12 (×4): 5 mg via INTRAVENOUS

## 2022-03-12 MED ORDER — GLUCAGON HCL RDNA (DIAGNOSTIC) 1 MG IJ SOLR
INTRAMUSCULAR | Status: AC
  Filled 2022-03-12: qty 2

## 2022-03-12 MED ORDER — DICLOFENAC SUPPOSITORY 100 MG
RECTAL | Status: DC | PRN
  Administered 2022-03-12: 100 mg via RECTAL

## 2022-03-12 MED ORDER — FENTANYL CITRATE (PF) 100 MCG/2ML IJ SOLN
INTRAMUSCULAR | Status: DC | PRN
  Administered 2022-03-12: 100 ug via INTRAVENOUS

## 2022-03-12 MED ORDER — LACTATED RINGERS IV SOLN
INTRAVENOUS | Status: DC
  Administered 2022-03-12: 1000 mL via INTRAVENOUS

## 2022-03-12 MED ORDER — LIDOCAINE HCL (CARDIAC) PF 100 MG/5ML IV SOSY
PREFILLED_SYRINGE | INTRAVENOUS | Status: DC | PRN
  Administered 2022-03-12: 60 mg via INTRAVENOUS

## 2022-03-12 MED ORDER — PHENYLEPHRINE HCL (PRESSORS) 10 MG/ML IV SOLN
INTRAVENOUS | Status: DC | PRN
  Administered 2022-03-12 (×2): 160 ug via INTRAVENOUS
  Administered 2022-03-12: 80 ug via INTRAVENOUS
  Administered 2022-03-12 (×5): 160 ug via INTRAVENOUS

## 2022-03-12 MED ORDER — GLUCAGON HCL RDNA (DIAGNOSTIC) 1 MG IJ SOLR
INTRAMUSCULAR | Status: DC | PRN
  Administered 2022-03-12: .25 mg via INTRAVENOUS
  Administered 2022-03-12: .5 mg via INTRAVENOUS
  Administered 2022-03-12: .25 mg via INTRAVENOUS

## 2022-03-12 MED ORDER — PROPOFOL 10 MG/ML IV BOLUS
INTRAVENOUS | Status: AC
  Filled 2022-03-12: qty 20

## 2022-03-12 MED ORDER — SUGAMMADEX SODIUM 500 MG/5ML IV SOLN
INTRAVENOUS | Status: DC | PRN
  Administered 2022-03-12: 300 mg via INTRAVENOUS

## 2022-03-12 MED ORDER — ROCURONIUM BROMIDE 100 MG/10ML IV SOLN
INTRAVENOUS | Status: DC | PRN
  Administered 2022-03-12: 20 mg via INTRAVENOUS
  Administered 2022-03-12 (×2): 30 mg via INTRAVENOUS

## 2022-03-12 MED ORDER — PROPOFOL 10 MG/ML IV BOLUS
INTRAVENOUS | Status: DC | PRN
  Administered 2022-03-12: 100 mg via INTRAVENOUS

## 2022-03-12 MED ORDER — CIPROFLOXACIN HCL 500 MG PO TABS: 500.0000 mg | ORAL_TABLET | Freq: Two times a day (BID) | ORAL | 0 refills | Status: AC

## 2022-03-12 MED ORDER — SODIUM CHLORIDE 0.9 % IV SOLN: INTRAVENOUS | Status: DC

## 2022-03-12 MED ORDER — CIPROFLOXACIN IN D5W 400 MG/200ML IV SOLN
INTRAVENOUS | Status: AC
  Filled 2022-03-12: qty 200

## 2022-03-12 NOTE — Op Note (Addendum)
[facility] Patient Name: Dean Randolph Procedure Date: 03/12/2022 MRN: [id number] Attending MD: Justice Britain , MD Date of Birth: 1940/08/07 CSN: [id number] Age: 81 Admit Type: Outpatient Procedure:                ERCP Indications:              Bile duct stone(s), For therapy of bile duct                            stone(s), Abnormal liver function test, Biliary                            stent removal Providers:                Justice Britain, MD, Jeanella Cara, RN,                            Cletis Athens, Technician Referring MD:             Claretta Fraise Medicines:                General Anesthesia, Cipro 400 mg IV, Diclofenac 100                            mg rectal, Glucagon 1 mg IV Complications:            No immediate complications. Estimated Blood Loss:     Estimated blood loss was minimal. Procedure:                Pre-Anesthesia Assessment:                           - Prior to the procedure, a History and Physical                            was performed, and patient medications and                            allergies were reviewed. The patient's tolerance of                            previous anesthesia was also reviewed. The risks                            and benefits of the procedure and the sedation                            options and risks were discussed with the patient.                            All questions were answered, and informed consent                            was obtained. Prior Anticoagulants: The patient has  taken no previous anticoagulant or antiplatelet                            agents. ASA Grade Assessment: III - A patient with                            severe systemic disease. After reviewing the risks                            and benefits, the patient was deemed in                            satisfactory condition to undergo the procedure.                           After  obtaining informed consent, the scope was                            passed under direct vision. Throughout the                            procedure, the patient's blood pressure, pulse, and                            oxygen saturations were monitored continuously. The                            TJF-Q190V ([id number]) Olympus duodenoscope was                            introduced through the mouth, and used to inject                            contrast into and used to inject contrast into the                            bile duct. The ERCP was technically difficult and                            complex due to challenging cannulation. Successful                            completion of the procedure was aided by performing                            the maneuvers documented (below) in this report.                            The patient tolerated the procedure. Scope In: Scope Out: Findings:      A scout film of the abdomen was obtained. Surgical clips, consistent       with previous cholecystectomy, were seen in the area of the right upper       quadrant of the abdomen. One stent  ending in the main bile duct was seen.      The esophagus was successfully intubated under direct vision without       detailed examination of the pharynx, larynx, and associated structures,       and upper GI tract. The major papilla was adjacent to a diverticulum. A       biliary sphincterotomy had been performed. The sphincterotomy appeared       open. One plastic biliary stent originating in the biliary tree was       emerging from the major papilla. One stent was removed from the biliary       tree using a snare.      A short 0.035 inch Soft Jagwire was passed into the biliary tree. The       Hydratome sphincterotome was passed over the guidewire and the bile duct       was then deeply cannulated. Contrast was injected. I personally       interpreted the bile duct images. Ductal flow of contrast was adequate.        Image quality was adequate. Contrast extended to the hepatic ducts and       cystic duct. Opacification of the entire biliary tree except for the       gallbladder was successful. The main bile duct and cystic duct contained       filling defects known to be stones and sludge. The middle third of the       main bile duct and upper third of the main bile duct were diffusely       dilated. The largest diameter was 15 mm. The cystic duct insertion had a       shelf-like appearance irregularity to it within the distal duct. The       lower third of the main bile duct contained an mild narrowing distally.       The biliary tree was swept with a retrieval balloon. Sludge was swept       from the duct. Many stones were removed. A few stones remained. Dilation       of the common bile duct in the narrowing distally with an 02-04-09 mm       balloon (to a maximum balloon size of 10 mm) dilator was successful as a       DASE sphincteroplasty (to match the distal duct size). To discover       objects, the biliary tree was swept with a retrieval balloon starting at       the left main hepatic duct and right main hepatic duct. Sludge was swept       from the duct. A few stones were removed. No stones were felt to be       remaining. A second short 0.035 inch Soft Jagwire was passed into the       biliary tree. The biliary tree was swept with a retrieval balloon       starting at the cystic duct. Sludge was swept from the duct. A few stone       fragments were removed. One stone remained and could not be removed. The       bile duct was explored endoscopically using the SpyGlass direct       visualization system. The SpyScope was advanced to the bifurcation.       Visibility with the scope was good. The lower third of the main bile  duct contained a localized irregularity in the region of the cystic duct       insertion. The middle third of the main bile duct and upper third of the       main  bile duct were dilated. The main bile duct did not have any       appearance of any further stones on Spy evaluation. The cystic duct       contained one stone that had been unable to be removed with typical       sweeping/trawl. Electrohydraulic lithotripsy was successful (total of       300 pulses were administered) with what appeared to be good effect. We       removed the Spyscope. The biliary tree was swept with a retrieval       balloon starting at the cystic duct. A few stone fragments were removed.       No stones remained. An occlusion cholangiogram was performed that showed       no further significant biliary pathology.      A pancreatogram was not performed.      The duodenoscope was withdrawn from the patient. Impression:               - The major papilla was adjacent to a diverticulum.                           - Prior biliary sphincterotomy appeared open.                           - One stent from the biliary tree was seen in the                            major papilla. This was removed.                           - Multiple filling defects consistent with stones                            and sludge were seen on the cholangiogram.                           - The upper third of the main bile duct and middle                            third of the main bile duct were dilated.                           - An irregularity was found in the cystic duct                            insertion (shelf-like appearance with low                            insertion).                           - An irregularity was found in the lower third of  the main bile duct very distally - more of a                            narrowing that appeared inflammatory from previous                            stone disease.                           - Choledocholithiasis was found. Complete removal                            was accomplished by DASE sphincteroplasty and                             balloon sweep/trawl. I could not remove the cystic                            duct stone without EHL and then sweeping however.                           - Electrohydraulic lithotripsy was successful. Moderate Sedation:      Not Applicable - Patient had care per Anesthesia. Recommendation:           - The patient will be observed post-procedure,                            until all discharge criteria are met.                           - Discharge patient to home.                           - Patient has a contact number available for                            emergencies. The signs and symptoms of potential                            delayed complications were discussed with the                            patient. Return to normal activities tomorrow.                            Written discharge instructions were provided to the                            patient.                           - Low fat diet for 1 week.                           - Observe patient's clinical course.                           -  Cipro (ciprofloxacin) 500 mg PO BID for 3 days.                           - Watch for pancreatitis, bleeding, perforation,                            and cholangitis.                           - Observe patient's clinical course.                           - Check liver enzymes (AST, ALT, alkaline                            phosphatase, bilirubin) in 2 weeks.                           - Patient with dilated biliary duct is at risk of                            recurrent stone formation (hopefully not). But with                            what appears to be inflammatory appearing narrowing                            distally, and the shelf-like appearance of the                            cystic duct insertion, I think we need to be                            vigilant about the risk of recurrent                            choledocholithiasis. I was able to pull a 12-15 mm                             balloon at the end of the procedure without issue,                            so hopefully he will do well moving forward.                           - The findings and recommendations were discussed                            with the patient.                           - The findings and recommendations were discussed  with the patient's family. Procedure Code(s):        --- Professional ---                           [phone], Endoscopic retrograde                            cholangiopancreatography (ERCP); with destruction                            of calculi, any method (eg, mechanical,                            electrohydraulic, lithotripsy)                           43275, Endoscopic retrograde                            cholangiopancreatography (ERCP); with removal of                            foreign body(s) or stent(s) from biliary/pancreatic                            duct(s)                           43273, Endoscopic cannulation of papilla with                            direct visualization of pancreatic/common bile                            duct(s) (List separately in addition to code(s) for                            primary procedure) Diagnosis Code(s):        --- Professional ---                           O97.35, Presence of other specified functional                            implants                           R93.2, Abnormal findings on diagnostic imaging of                            liver and biliary tract                           K80.50, Calculus of bile duct without cholangitis                            or cholecystitis without obstruction  Z46.59, Encounter for fitting and adjustment of                            other gastrointestinal appliance and device                           R94.5, Abnormal results of liver function studies                           K83.8, Other specified diseases  of biliary tract CPT copyright 2019 [employer] Association. All rights reserved. The codes documented in this report are preliminary and upon coder review may  be revised to meet current compliance requirements. Justice Britain, MD 03/12/2022 10:09:53 AM Number of Addenda: 0

## 2022-03-12 NOTE — Anesthesia Postprocedure Evaluation (Signed)
Anesthesia Post Note  Patient: Dean Randolph  Procedure(s) Performed: ENDOSCOPIC RETROGRADE CHOLANGIOPANCREATOGRAPHY (ERCP) WITH PROPOFOL STENT REMOVAL REMOVAL OF STONES SPYGLASS LITHOTRIPSY SPYGLASS CHOLANGIOSCOPY BILIARY DILATION     Patient location during evaluation: PACU Anesthesia Type: General Level of consciousness: awake and alert and oriented Pain management: pain level controlled Vital Signs Assessment: post-procedure vital signs reviewed and stable Respiratory status: spontaneous breathing, nonlabored ventilation and respiratory function stable Cardiovascular status: blood pressure returned to baseline and stable Postop Assessment: no apparent nausea or vomiting Anesthetic complications: no   No notable events documented.  Last Vitals:  Vitals:   03/12/22 0950 03/12/22 1000  BP: (!) 145/76   Pulse: 80 72  Resp: (!) 23 18  Temp:    SpO2: 100% 98%    Last Pain:  Vitals:   03/12/22 1000  TempSrc:   PainSc: 0-No pain                 [name] A.

## 2022-03-12 NOTE — Transfer of Care (Signed)
Immediate Anesthesia Transfer of Care Note  Patient: CLIFTON SAFLEY  Procedure(s) Performed: ENDOSCOPIC RETROGRADE CHOLANGIOPANCREATOGRAPHY (ERCP) WITH PROPOFOL STENT REMOVAL REMOVAL OF STONES SPYGLASS LITHOTRIPSY SPYGLASS CHOLANGIOSCOPY BILIARY DILATION  Patient Location: PACU  Anesthesia Type:General  Level of Consciousness: awake, alert , oriented and patient cooperative  Airway & Oxygen Therapy: Patient Spontanous Breathing and Patient connected to nasal cannula oxygen  Post-op Assessment: Report given to RN, Post -op Vital signs reviewed and stable and Patient moving all extremities X 4  Post vital signs: Reviewed and stable  Last Vitals:  Vitals Value Taken Time  BP    Temp    Pulse 74 03/12/22 0947  Resp 8 03/12/22 0947  SpO2 100 % 03/12/22 0947  Vitals shown include unvalidated device data.  Last Pain:  Vitals:   03/12/22 0631  TempSrc: Tympanic  PainSc: 0-No pain         Complications: No notable events documented.

## 2022-03-12 NOTE — Discharge Instructions (Signed)
YOU HAD AN ENDOSCOPIC PROCEDURE TODAY: Refer to the procedure report and other information in the discharge instructions given to you for any specific questions about what was found during the examination. If this information does not answer your questions, please call [place] office at [phone] to clarify.   YOU SHOULD EXPECT: Some feelings of bloating in the abdomen. Passage of more gas than usual. Walking can help get rid of the air that was put into your GI tract during the procedure and reduce the bloating. If you had a lower endoscopy (such as a colonoscopy or flexible sigmoidoscopy) you may notice spotting of blood in your stool or on the toilet paper. Some abdominal soreness may be present for a day or two, also.  DIET: Your first meal following the procedure should be a light meal and then it is ok to progress to your normal diet. A half-sandwich or bowl of soup is an example of a good first meal. Heavy or fried foods are harder to digest and may make you feel nauseous or bloated. Drink plenty of fluids but you should avoid alcoholic beverages for 24 hours. If you had a esophageal dilation, please see attached instructions for diet.    ACTIVITY: Your care partner should take you home directly after the procedure. You should plan to take it easy, moving slowly for the rest of the day. You can resume normal activity the day after the procedure however YOU SHOULD NOT DRIVE, use power tools, machinery or perform tasks that involve climbing or major physical exertion for 24 hours (because of the sedation medicines used during the test).   SYMPTOMS TO REPORT IMMEDIATELY: A gastroenterologist can be reached at any hour. Please call [phone]  for any of the following symptoms:   Following upper endoscopy (EGD, EUS, ERCP, esophageal dilation) Vomiting of blood or coffee ground material  New, significant abdominal pain  New, significant chest pain or pain under the shoulder blades  Painful or  persistently difficult swallowing  New shortness of breath  Black, tarry-looking or red, bloody stools  FOLLOW UP:  If any biopsies were taken you will be contacted by phone or by letter within the next 1-3 weeks. Call [phone]  if you have not heard about the biopsies in 3 weeks.  Please also call with any specific questions about appointments or follow up tests.  

## 2022-03-12 NOTE — Telephone Encounter (Signed)
Lab order has been entered  Follow up has been scheduled for 04/22/22 at 2:30 pm with GM  Letter mailed to the pt with appt information and lab order.

## 2022-03-12 NOTE — H&P (Addendum)
GASTROENTEROLOGY PROCEDURE H&P NOTE   Primary Care Physician: Mechele Claude, MD  HPI: Dean Randolph is a 81 y.o. male who presents for ERCP for biliary stent exchange/removal with choledocholithiasis and with Mirrizi-like issues.  Past Medical History:  Diagnosis Date   Hypertension    Past Surgical History:  Procedure Laterality Date   BILIARY STENT PLACEMENT N/A 02/06/2021   Procedure: BILIARY STENT PLACEMENT;  Surgeon: Malissa Hippo, MD;  Location: [facility];  Service: Gastroenterology;  Laterality: N/A;   BILIARY STENT PLACEMENT N/A 03/26/2021   Procedure: BILIARY STENT PLACEMENT 10 FRENCH BY 9cm;  Surgeon: Malissa Hippo, MD;  Location: [facility];  Service: Endoscopy;  Laterality: N/A;   BILIARY STENT PLACEMENT N/A 01/19/2022   Procedure: BILIARY STENT PLACEMENT;  Surgeon: Meridee Score Netty Starring., MD;  Location: [facility];  Service: Gastroenterology;  Laterality: N/A;   BIOPSY  01/19/2022   Procedure: BIOPSY;  Surgeon: Meridee Score Netty Starring., MD;  Location: [facility];  Service: Gastroenterology;;   CATARACT EXTRACTION Bilateral    CHOLECYSTECTOMY N/A 04/25/2021   Procedure: LAPAROSCOPIC CHOLECYSTECTOMY;  Surgeon: [name], MD;  Location: [facility];  Service: General;  Laterality: N/A;   ENDOSCOPIC RETROGRADE CHOLANGIOPANCREATOGRAPHY (ERCP) WITH PROPOFOL N/A 01/19/2022   Procedure: ENDOSCOPIC RETROGRADE CHOLANGIOPANCREATOGRAPHY (ERCP) WITH PROPOFOL;  Surgeon: Lemar Lofty., MD;  Location: [facility];  Service: Gastroenterology;  Laterality: N/A;   ERCP N/A 02/06/2021   Procedure: ENDOSCOPIC RETROGRADE CHOLANGIOPANCREATOGRAPHY (ERCP);  Surgeon: Malissa Hippo, MD;  Location: [facility];  Service: Gastroenterology;  Laterality: N/A;   ERCP N/A 03/26/2021   Procedure: ENDOSCOPIC RETROGRADE CHOLANGIOPANCREATOGRAPHY (ERCP) with EXTENDED SPHINCTEROTOMY;  Surgeon: Malissa Hippo, MD;  Location: [facility];  Service: Endoscopy;  Laterality: N/A;   ERCP N/A 10/27/2021    Procedure: ENDOSCOPIC RETROGRADE CHOLANGIOPANCREATOGRAPHY (ERCP);  Surgeon: Malissa Hippo, MD;  Location: [facility];  Service: Endoscopy;  Laterality: N/A;  no site    GASTROINTESTINAL STENT REMOVAL N/A 03/26/2021   Procedure: GASTROINTESTINAL STENT REMOVAL;  Surgeon: Malissa Hippo, MD;  Location: [facility];  Service: Endoscopy;  Laterality: N/A;   GASTROINTESTINAL STENT REMOVAL N/A 10/27/2021   Procedure: GASTROINTESTINAL STENT EXCHANGE;  Surgeon: Malissa Hippo, MD;  Location: [facility];  Service: Endoscopy;  Laterality: N/A;  no site    REMOVAL OF STONES N/A 03/26/2021   Procedure: REMOVAL OF STONES WITH EXTRACTION BASKET;  Surgeon: Malissa Hippo, MD;  Location: [facility];  Service: Endoscopy;  Laterality: N/A;   REMOVAL OF STONES N/A 10/27/2021   Procedure: REMOVAL OF STONES;  Surgeon: Malissa Hippo, MD;  Location: [facility];  Service: Endoscopy;  Laterality: N/A;   REMOVAL OF STONES  01/19/2022   Procedure: REMOVAL OF STONES;  Surgeon: Meridee Score Netty Starring., MD;  Location: [facility];  Service: Gastroenterology;;   Dennison Mascot N/A 02/06/2021   Procedure: Dennison Mascot;  Surgeon: Malissa Hippo, MD;  Location: [facility];  Service: Gastroenterology;  Laterality: N/A;   SPHINCTEROTOMY N/A 10/27/2021   Procedure: SPHINCTEROTOMY;  Surgeon: Malissa Hippo, MD;  Location: [facility];  Service: Endoscopy;  Laterality: N/A;   SPYGLASS CHOLANGIOSCOPY N/A 01/19/2022   Procedure: SPYGLASS CHOLANGIOSCOPY;  Surgeon: Lemar Lofty., MD;  Location: [facility];  Service: Gastroenterology;  Laterality: N/A;   STENT REMOVAL  01/19/2022   Procedure: STENT REMOVAL;  Surgeon: Lemar Lofty., MD;  Location: [facility];  Service: Gastroenterology;;   STONE EXTRACTION WITH BASKET  01/19/2022   Procedure: STONE EXTRACTION WITH BASKET;  Surgeon: Lemar Lofty., MD;  Location: [facility];  Service: Gastroenterology;;   Current Facility-Administered Medications  Medication Dose Route  Frequency Provider Last Rate Last Admin   0.9 %  sodium chloride infusion   Intravenous Continuous Mansouraty, Netty Starring., MD       lactated ringers infusion   Intravenous Continuous Mansouraty, Netty Starring., MD 10 mL/hr at 03/12/22 0643 1,000 mL at 03/12/22 0643    Current Facility-Administered Medications:    0.9 %  sodium chloride infusion, , Intravenous, Continuous, Mansouraty, Netty Starring., MD   lactated ringers infusion, , Intravenous, Continuous, Mansouraty, Netty Starring., MD, Last Rate: 10 mL/hr at 03/12/22 0643, 1,000 mL at 03/12/22 0643 No Known Allergies Family History  Problem Relation Age of Onset   Stroke Mother    Social History   Socioeconomic History   Marital status: Married    Spouse name: Not on file   Number of children: Not on file   Years of education: Not on file   Highest education level: Not on file  Occupational History   Occupation: electrician    Comment: retired  Tobacco Use   Smoking status: Never    Passive exposure: Past   Smokeless tobacco: Never  Vaping Use   Vaping Use: Never used  Substance and Sexual Activity   Alcohol use: Never   Drug use: Never   Sexual activity: Yes  Other Topics Concern   Not on file  Social History Narrative   Not on file   Social Determinants of Health   Financial Resource Strain: Not on file  Food Insecurity: Not on file  Transportation Needs: Not on file  Physical Activity: Not on file  Stress: Not on file  Social Connections: Not on file  Intimate Partner Violence: Not on file    Physical Exam: Today's Vitals   03/12/22 0631  BP: (!) 166/75  Pulse: 74  Resp: 15  Temp: 97.9 F (36.6 C)  TempSrc: Tympanic  SpO2: 99%  Weight: 76 kg  Height: 5\' 6"  (1.676 m)  PainSc: 0-No pain   Body mass index is 27.04 kg/m. GEN: NAD EYE: Sclerae anicteric ENT: MMM CV: Non-tachycardic GI: Soft, NT/ND NEURO:  Alert & Oriented x 3  Lab Results: No results for input(s): "WBC", "HGB", "HCT", "PLT" in the  last 72 hours. BMET No results for input(s): "NA", "K", "CL", "CO2", "GLUCOSE", "BUN", "CREATININE", "CALCIUM" in the last 72 hours. LFT No results for input(s): "PROT", "ALBUMIN", "AST", "ALT", "ALKPHOS", "BILITOT", "BILIDIR", "IBILI" in the last 72 hours. PT/INR No results for input(s): "LABPROT", "INR" in the last 72 hours.   Impression / Plan: This is a 81 y.o.male who presents for ERCP for biliary stent exchange/removal with choledocholithiasis and with Mirrizi-like issues.  The risks of an ERCP were discussed at length, including but not limited to the risk of perforation, bleeding, abdominal pain, post-ERCP pancreatitis (while usually mild can be severe and even life threatening).  The risks and benefits of endoscopic evaluation/treatment were discussed with the patient and/or family; these include but are not limited to the risk of perforation, infection, bleeding, missed lesions, lack of diagnosis, severe illness requiring hospitalization, as well as anesthesia and sedation related illnesses.  The patient's history has been reviewed, patient examined, no change in status, and deemed stable for procedure.  The patient and/or family is agreeable to proceed.    77, MD [facility] Gastroenterology Advanced Endoscopy Office # [phone]

## 2022-03-12 NOTE — Telephone Encounter (Signed)
-----   Message from Lemar Lofty., MD sent at 03/12/2022 10:46 AM EDT ----- Regarding: Follow up plan Dean Randolph, This patient will need repeat hepatic function panel in 2 weeks.  He can get that done at the [facility] laboratory. He can follow-up in clinic with myself or one of the APP's in 6 to 8 weeks okay to overbook with me if needed. Thanks. GM

## 2022-03-12 NOTE — Anesthesia Procedure Notes (Signed)
Procedure Name: Intubation Date/Time: 03/12/2022 7:45 AM  Performed by: Jonna Munro, CRNAPre-anesthesia Checklist: Patient identified, Emergency Drugs available, Suction available, Patient being monitored and Timeout performed Patient Re-evaluated:Patient Re-evaluated prior to induction Oxygen Delivery Method: Circle system utilized Preoxygenation: Pre-oxygenation with 100% oxygen Induction Type: IV induction, Rapid sequence and Cricoid Pressure applied Laryngoscope Size: Mac and 3 Grade View: Grade I Tube type: Oral Tube size: 7.5 mm Number of attempts: 1 Airway Equipment and Method: Stylet Placement Confirmation: ETT inserted through vocal cords under direct vision, positive ETCO2, CO2 detector and breath sounds checked- equal and bilateral Secured at: 22 cm Tube secured with: Tape Dental Injury: Teeth and Oropharynx as per pre-operative assessment

## 2022-03-15 ENCOUNTER — Encounter (HOSPITAL_COMMUNITY): Payer: Self-pay | Admitting: Gastroenterology

## 2022-03-30 ENCOUNTER — Other Ambulatory Visit (INDEPENDENT_AMBULATORY_CARE_PROVIDER_SITE_OTHER): Payer: PPO

## 2022-03-30 DIAGNOSIS — K8050 Calculus of bile duct without cholangitis or cholecystitis without obstruction: Secondary | ICD-10-CM | POA: Diagnosis not present

## 2022-03-30 LAB — HEPATIC FUNCTION PANEL
ALT: 20 U/L (ref 0–53)
AST: 22 U/L (ref 0–37)
Albumin: 4 g/dL (ref 3.5–5.2)
Alkaline Phosphatase: 146 U/L — ABNORMAL HIGH (ref 39–117)
Bilirubin, Direct: 0.1 mg/dL (ref 0.0–0.3)
Total Bilirubin: 0.5 mg/dL (ref 0.2–1.2)
Total Protein: 7.5 g/dL (ref 6.0–8.3)

## 2022-04-22 ENCOUNTER — Encounter: Payer: Self-pay | Admitting: Gastroenterology

## 2022-04-22 ENCOUNTER — Other Ambulatory Visit (INDEPENDENT_AMBULATORY_CARE_PROVIDER_SITE_OTHER): Payer: PPO

## 2022-04-22 ENCOUNTER — Ambulatory Visit (INDEPENDENT_AMBULATORY_CARE_PROVIDER_SITE_OTHER): Payer: PPO | Admitting: Gastroenterology

## 2022-04-22 VITALS — BP 118/78 | HR 78 | Ht 67.0 in | Wt 165.6 lb

## 2022-04-22 DIAGNOSIS — R7989 Other specified abnormal findings of blood chemistry: Secondary | ICD-10-CM

## 2022-04-22 DIAGNOSIS — Z1211 Encounter for screening for malignant neoplasm of colon: Secondary | ICD-10-CM

## 2022-04-22 DIAGNOSIS — Z9889 Other specified postprocedural states: Secondary | ICD-10-CM

## 2022-04-22 DIAGNOSIS — R748 Abnormal levels of other serum enzymes: Secondary | ICD-10-CM

## 2022-04-22 DIAGNOSIS — K8050 Calculus of bile duct without cholangitis or cholecystitis without obstruction: Secondary | ICD-10-CM | POA: Diagnosis not present

## 2022-04-22 LAB — HEPATIC FUNCTION PANEL
ALT: 25 U/L (ref 0–53)
AST: 27 U/L (ref 0–37)
Albumin: 4.1 g/dL (ref 3.5–5.2)
Alkaline Phosphatase: 135 U/L — ABNORMAL HIGH (ref 39–117)
Bilirubin, Direct: 0.1 mg/dL (ref 0.0–0.3)
Total Bilirubin: 0.4 mg/dL (ref 0.2–1.2)
Total Protein: 7.8 g/dL (ref 6.0–8.3)

## 2022-04-22 NOTE — Patient Instructions (Signed)
Please contact office and let us know if you have decided to schedule colonoscopy or you would like to have Cologuard Testing done.   Your provider has requested that you go to the basement level for lab work before leaving today. Press "B" on the elevator. The lab is located at the first door on the left as you exit the elevator.  Follow-up in 6 months.   _______________________________________________________  If you are age 80 or older, your body mass index should be between 23-30. Your Body mass index is 25.94 kg/m. If this is out of the aforementioned range listed, please consider follow up with your Primary Care Provider.  If you are age 30 or younger, your body mass index should be between 19-25. Your Body mass index is 25.94 kg/m. If this is out of the aformentioned range listed, please consider follow up with your Primary Care Provider.   ________________________________________________________  The [facility] providers would like to encourage you to use [facility] to communicate with providers for non-urgent requests or questions.  Due to long hold times on the telephone, sending your provider a message by [facility] may be a faster and more efficient way to get a response.  Please allow 48 business hours for a response.  Please remember that this is for non-urgent requests.  _______________________________________________________  Thank you for choosing me and [facility].  Dr. Rush Landmark

## 2022-04-23 ENCOUNTER — Encounter: Payer: Self-pay | Admitting: Gastroenterology

## 2022-04-23 DIAGNOSIS — R748 Abnormal levels of other serum enzymes: Secondary | ICD-10-CM | POA: Insufficient documentation

## 2022-04-23 DIAGNOSIS — Z9889 Other specified postprocedural states: Secondary | ICD-10-CM | POA: Insufficient documentation

## 2022-04-23 DIAGNOSIS — Z1211 Encounter for screening for malignant neoplasm of colon: Secondary | ICD-10-CM | POA: Insufficient documentation

## 2022-04-23 NOTE — Progress Notes (Signed)
[place] VISIT   Primary Care Provider Claretta Fraise, MD [place] [address] [phone]  Patient Profile: Dean Randolph is a 81 y.o. male with a pmh significant for hypertension, GERD, status post cholecystectomy, status post multiple ERCPs for choledocholithiasis.  The patient presents to the [facility] for an evaluation and management of problem(s) noted below:  Problem List 1. Choledocholithiasis   2. History of ERCP   3. Elevated alkaline phosphatase level   4. Colon cancer screening     History of Present Illness This is the patient's first visit to the outpatient [facility] clinic.  I met the patient back in July for his fourth ERCP.  His previous 3 ERCPs done for choledocholithiasis were done by Dr. Laural Golden where multiple stones were found in biliary stenting had to be performed each time.  On my first ERCP he had a significant amount of stone burden that required sphincteroplasty and EHL.  Last ERCP performed in September was able to extract the rest of the stone debris in the CBD and required EHL of the cystic duct stone with clearance at that time.  His bile duct remained dilated and so the post and the risk of potential sludge formation in the future which she was made aware of.  He had repeat labs done in the last month that showed an improving alkaline phosphatase but not normalization.  Patient comes in unaccompanied today but has been doing quite well.  He has no significant issues to describe or have concerns with today.  The patient denies any issues with jaundice, scleral icterus, generalized pruritus, darkened/amber urine, clay-colored stools, LE edema, hematemesis, coffee-ground emesis, abdominal distention, confusion.  The patient states that he has previously had colon cancer screening more than 10 years ago with a colonoscopy.  He has not had any alterations of his bowel habits.  He has not had polyps on  his previous colonoscopy that he reports.  He is not sure if he wants to continue colon cancer screening or not.  GI Review of Systems Positive as above Negative for dysphagia, odynophagia, nausea, vomiting, melena, hematochezia  Review of Systems General: Denies fevers/chills/weight loss unintentionally Cardiovascular: Denies chest pain Pulmonary: Denies shortness of breath Gastroenterological: See HPI Genitourinary: Denies darkened urine Hematological: Denies easy bruising/bleeding Dermatological: Denies jaundice Psychological: Mood is stable   Medications Current Outpatient Medications  Medication Sig Dispense Refill   amLODipine (NORVASC) 5 MG tablet Take 1 tablet (5 mg total) by mouth daily. 90 tablet 3   lactose free nutrition (BOOST) LIQD Take 237 mLs by mouth every other day.     Multiple Vitamins-Minerals (MULTIVITAMIN WITH MINERALS) tablet Take 1 tablet by mouth 2 (two) times a week. Centrum Silver     pantoprazole (PROTONIX) 40 MG tablet Take 1 tablet (40 mg total) by mouth daily before breakfast. (Patient taking differently: Take 40 mg by mouth daily as needed (acid reflux).) 30 tablet 5   No current facility-administered medications for this visit.    Allergies No Known Allergies  Histories Past Medical History:  Diagnosis Date   Hypertension    Past Surgical History:  Procedure Laterality Date   BILIARY DILATION  03/12/2022   Procedure: BILIARY DILATION;  Surgeon: Rush Landmark Telford Nab., MD;  Location: [facility];  Service: Gastroenterology;;   BILIARY STENT PLACEMENT N/A 02/06/2021   Procedure: BILIARY STENT PLACEMENT;  Surgeon: Rogene Houston, MD;  Location: [facility];  Service: Gastroenterology;  Laterality: N/A;  BILIARY STENT PLACEMENT N/A 03/26/2021   Procedure: BILIARY STENT PLACEMENT 10 FRENCH BY 9cm;  Surgeon: Rehman, Najeeb U, MD;  Location: [facility];  Service: Endoscopy;  Laterality: N/A;   BILIARY STENT PLACEMENT N/A 01/19/2022   Procedure: BILIARY  STENT PLACEMENT;  Surgeon: Mansouraty, Gabriel Jr., MD;  Location: [facility];  Service: Gastroenterology;  Laterality: N/A;   BIOPSY  01/19/2022   Procedure: BIOPSY;  Surgeon: Mansouraty, Gabriel Jr., MD;  Location: [facility];  Service: Gastroenterology;;   CATARACT EXTRACTION Bilateral    CHOLECYSTECTOMY N/A 04/25/2021   Procedure: LAPAROSCOPIC CHOLECYSTECTOMY;  Surgeon: Jenkins, Mark, MD;  Location: [facility];  Service: General;  Laterality: N/A;   ENDOSCOPIC RETROGRADE CHOLANGIOPANCREATOGRAPHY (ERCP) WITH PROPOFOL N/A 01/19/2022   Procedure: ENDOSCOPIC RETROGRADE CHOLANGIOPANCREATOGRAPHY (ERCP) WITH PROPOFOL;  Surgeon: Mansouraty, Gabriel Jr., MD;  Location: [facility];  Service: Gastroenterology;  Laterality: N/A;   ENDOSCOPIC RETROGRADE CHOLANGIOPANCREATOGRAPHY (ERCP) WITH PROPOFOL N/A 03/12/2022   Procedure: ENDOSCOPIC RETROGRADE CHOLANGIOPANCREATOGRAPHY (ERCP) WITH PROPOFOL;  Surgeon: Mansouraty, Gabriel Jr., MD;  [facility];  Service: Gastroenterology;  Laterality: N/A;   ERCP N/A 02/06/2021   Procedure: ENDOSCOPIC RETROGRADE CHOLANGIOPANCREATOGRAPHY (ERCP);  Surgeon: Rehman, Najeeb U, MD;  Location: [facility];  Service: Gastroenterology;  Laterality: N/A;   ERCP N/A 03/26/2021   Procedure: ENDOSCOPIC RETROGRADE CHOLANGIOPANCREATOGRAPHY (ERCP) with EXTENDED SPHINCTEROTOMY;  Surgeon: Rehman, Najeeb U, MD;  Location: [facility];  Service: Endoscopy;  Laterality: N/A;   ERCP N/A 10/27/2021   Procedure: ENDOSCOPIC RETROGRADE CHOLANGIOPANCREATOGRAPHY (ERCP);  Surgeon: Rehman, Najeeb U, MD;  Location: [facility];  Service: Endoscopy;  Laterality: N/A;  no site    GASTROINTESTINAL STENT REMOVAL N/A 03/26/2021   Procedure: GASTROINTESTINAL STENT REMOVAL;  Surgeon: Rehman, Najeeb U, MD;  Location: [facility];  Service: Endoscopy;  Laterality: N/A;   GASTROINTESTINAL STENT REMOVAL N/A 10/27/2021   Procedure: GASTROINTESTINAL STENT EXCHANGE;  Surgeon: Rehman, Najeeb U, MD;  Location: [facility];  Service:  Endoscopy;  Laterality: N/A;  no site    REMOVAL OF STONES N/A 03/26/2021   Procedure: REMOVAL OF STONES WITH EXTRACTION BASKET;  Surgeon: Rehman, Najeeb U, MD;  Location: [facility];  Service: Endoscopy;  Laterality: N/A;   REMOVAL OF STONES N/A 10/27/2021   Procedure: REMOVAL OF STONES;  Surgeon: Rehman, Najeeb U, MD;  Location: [facility];  Service: Endoscopy;  Laterality: N/A;   REMOVAL OF STONES  01/19/2022   Procedure: REMOVAL OF STONES;  Surgeon: Mansouraty, Gabriel Jr., MD;  [facility]: [facility];  Service: Gastroenterology;;   REMOVAL OF STONES  03/12/2022   Procedure: REMOVAL OF STONES;  Surgeon: Mansouraty, Gabriel Jr., MD;  [facility];  Service: Gastroenterology;;   SPHINCTEROTOMY N/A 02/06/2021   Procedure: SPHINCTEROTOMY;  Surgeon: Rehman, Najeeb U, MD;  Location: [facility];  Service: Gastroenterology;  Laterality: N/A;   SPHINCTEROTOMY N/A 10/27/2021   Procedure: SPHINCTEROTOMY;  Surgeon: Rehman, Najeeb U, MD;  Location: [facility];  Service: Endoscopy;  Laterality: N/A;   SPYGLASS CHOLANGIOSCOPY N/A 01/19/2022   Procedure: SPYGLASS CHOLANGIOSCOPY;  Surgeon: Mansouraty, Gabriel Jr., MD;  [facility];  Service: Gastroenterology;  Laterality: N/A;   SPYGLASS CHOLANGIOSCOPY N/A 03/12/2022   Procedure: SPYGLASS CHOLANGIOSCOPY;  Surgeon: Mansouraty, Gabriel Jr., MD;  Location: [facility];  Service: Gastroenterology;  Laterality: N/A;   SPYGLASS LITHOTRIPSY N/A 03/12/2022   Procedure: SPYGLASS LITHOTRIPSY;  Surgeon: Mansouraty, Gabriel Jr., MD;  [facility]: [facility];  Service: Gastroenterology;  Laterality: N/A;   STENT REMOVAL  01/19/2022   Procedure: STENT REMOVAL;  Surgeon: Mansouraty, Gabriel Jr., MD;    [facility];  Service: Gastroenterology;;   STENT REMOVAL  03/12/2022   Procedure: STENT REMOVAL;  Surgeon: Mansouraty, Gabriel Jr., MD;  [facility];  Service: Gastroenterology;;   STONE EXTRACTION WITH BASKET  01/19/2022   Procedure: STONE EXTRACTION WITH  BASKET;  Surgeon: Mansouraty, Gabriel Jr., MD;  Location: [facility];  Service: Gastroenterology;;   Social History   Socioeconomic History   Marital status: Married    Spouse name: Not on file   Number of children: Not on file   Years of education: Not on file   Highest education level: Not on file  Occupational History   Occupation: electrician    Comment: retired  Tobacco Use   Smoking status: Never    Passive exposure: Past   Smokeless tobacco: Never  Vaping Use   Vaping Use: Never used  Substance and Sexual Activity   Alcohol use: Never   Drug use: Never   Sexual activity: Yes  Other Topics Concern   Not on file  Social History Narrative   Not on file   Social Determinants of Health   Financial Resource Strain: Not on file  Food Insecurity: Not on file  Transportation Needs: Not on file  Physical Activity: Not on file  Stress: Not on file  Social Connections: Not on file  Intimate Partner Violence: Not on file   Family History  Problem Relation Age of Onset   Stroke Mother    Colon cancer Neg Hx    Esophageal cancer Neg Hx    Inflammatory bowel disease Neg Hx    Liver disease Neg Hx    Pancreatic cancer Neg Hx    Rectal cancer Neg Hx    Stomach cancer Neg Hx    I have reviewed his medical, social, and family history in detail and updated the electronic medical record as necessary.    PHYSICAL EXAMINATION  BP 118/78   Pulse 78   Ht 5' 7" (1.702 m)   Wt 165 lb 9.6 oz (75.1 kg)   SpO2 98%   BMI 25.94 kg/m  Wt Readings from Last 3 Encounters:  04/22/22 165 lb 9.6 oz (75.1 kg)  03/12/22 167 lb 8.8 oz (76 kg)  01/19/22 160 lb (72.6 kg)  GEN: NAD, appears stated age, doesn't appear chronically ill PSYCH: Cooperative, without pressured speech EYE: Conjunctivae pink, sclerae anicteric ENT: MMM CV: Nontachycardic RESP: No audible wheezing GI: NABS, soft, NT/ND, without rebound  MSK/EXT: No lower extremity edema SKIN: No jaundice NEURO:  Alert &  Oriented x 3, no focal deficits   REVIEW OF DATA  I reviewed the following data at the time of this encounter:  GI Procedures and Studies  September 2023 ERCP - The major papilla was adjacent to a diverticulum. - Prior biliary sphincterotomy appeared open. - One stent from the biliary tree was seen in the major papilla. This was removed. - Multiple filling defects consistent with stones and sludge were seen on the cholangiogram. - The upper third of the main bile duct and middle third of the main bile duct were dilated. - An irregularity was found in the cystic duct insertion (shelf-like appearance with low insertion). - An irregularity was found in the lower third of the main bile duct very distally - more of a narrowing that appeared inflammatory from previous stone disease. - Choledocholithiasis was found. Complete removal was accomplished by DASE sphincteroplasty and balloon sweep/trawl. I could not remove the cystic duct stone without EHL and then sweeping however. -   Electrohydraulic lithotripsy was successful.  July 2023 ERCP - The patient has had a cholecystectomy. - J-shaped deformity of stomach. Gastritis - biopsied. - No gross lesions in the duodenal bulb, in the first portion of the duodenum and in the second portion of the duodenum. - Biliary stent removed. Prior sphincterotomy present. - Difficult cannulation of the proximal biliary tree due to low-lying cystic duct. - The cystic duct and upper third of the main bile duct were dilated. - Filling defects consistent with stones and sludge were seen on the cholangiogram in the bile duct and the cystic duct. - Choledocholithiasis was found in biliary tree. Sphincteroplasty performed. Balloon trawl performed but did not remove all the stones. Mechanical lithotripsy was performed but not completely able to remove all stone debris. - Access lost and eventually had to use SpyGlass to allow repeat attempt at biliary cannulation  (as documented above). - Significant improvement in the bile duct debris/stone burden but there is still a significant amount of debris in the cystic duct stump. - One plastic biliary stent was placed into the common bile duct. - Procedure completed.  Laboratory Studies  Reviewed those in epic  Imaging Studies  August 2022 MRI/MRCP IMPRESSION: 1. There is gross intra and extrahepatic biliary ductal dilatation, the common bile duct measuring up to 2.1 cm centrally with numerous gallstones (>10) present in the pancreatic portion of the common bile duct, the largest near the ampulla measuring at least 1.3 cm. Findings are consistent with choledocholithiasis. 2. Relatively contracted gallbladder containing multiple additional gallstones.   ASSESSMENT  Mr. Pennie is a 80 y.o. malewith a pmh significant for hypertension, GERD, status post cholecystectomy, status post multiple ERCPs for choledocholithiasis. The patient is seen today for evaluation and management of:  1. Choledocholithiasis   2. History of ERCP   3. Elevated alkaline phosphatase level   4. Colon cancer screening    The patient is clinically and hemodynamically stable at this time.  I am hopeful that his biliary duct struggles are a thing of the past for him.  With that being said, he did have a slight persisting elevation in his alkaline phosphatase and that may well be a result of his chronic ductal disease that he had over the course of the last year but hopefully he will not have any recurrence.  He has a dilated biliary tree that may portend a chance of recurrent sludge/sludge stone formation but most patients will do quite well with the sphincteroplasty that he has.  Time will tell.  Lets recheck his liver tests and as long as they are downtrending or normalizing we will just monitor things for now.  Patient no symptoms to alert us to or if there is a rapid rise in LFTs and repeat imaging may be required to consider the  role of ERCP.  The patient and I had a thorough discussion about colon cancer screening.  He is not sure if he wants to continue this or not but will discuss this with his wife and let us know in the coming weeks.  He would remain an average risk individual based on previous negative colonoscopy and no family history and no symptoms so he could choose Cologuard or colonoscopy or just decide to stop colon cancer screening.  He understands that if we do no colon cancer screening as its been greater than 10 years, he may be at risk colon cancer development at some point in the future but will let us know where things stand   in the next few months.  All patient questions were answered to the best of my ability, and the patient agrees to the aforementioned plan of action with follow-up as indicated.   PLAN  Hepatic function panel to be obtained If laboratories are increasing substantially then will need repeat imaging with MRI/MRCP to be performed If laboratories are decreasing or normalized then we will just monitor Colon cancer screening discussed and patient will let us know in the next couple of months what he decides in regards to Cologuard/colonoscopy versus stopping colon cancer screening   Orders Placed This Encounter  Procedures   Hepatic function panel    New Prescriptions   No medications on file   Modified Medications   No medications on file    Planned Follow Up No follow-ups on file.   Total Time in Face-to-Face and in Coordination of Care for patient including independent/personal interpretation/review of prior testing, medical history, examination, medication adjustment, communicating results with the patient directly, and documentation within the EHR is 25 minutes.   Justice Britain, MD [facility] Office # [phone]

## 2022-04-24 ENCOUNTER — Other Ambulatory Visit: Payer: Self-pay

## 2022-04-24 DIAGNOSIS — R748 Abnormal levels of other serum enzymes: Secondary | ICD-10-CM

## 2022-04-24 DIAGNOSIS — K8050 Calculus of bile duct without cholangitis or cholecystitis without obstruction: Secondary | ICD-10-CM

## 2022-05-30 ENCOUNTER — Other Ambulatory Visit (INDEPENDENT_AMBULATORY_CARE_PROVIDER_SITE_OTHER): Payer: Self-pay | Admitting: Internal Medicine

## 2022-06-06 ENCOUNTER — Other Ambulatory Visit: Payer: Self-pay | Admitting: Family Medicine

## 2022-07-07 ENCOUNTER — Other Ambulatory Visit: Payer: Self-pay | Admitting: Family Medicine

## 2022-09-05 ENCOUNTER — Other Ambulatory Visit (INDEPENDENT_AMBULATORY_CARE_PROVIDER_SITE_OTHER): Payer: Self-pay | Admitting: Gastroenterology

## 2022-09-07 NOTE — Telephone Encounter (Signed)
Please schedule patient for an appointment per Dr.Castaneda with any provider.

## 2022-09-07 NOTE — Telephone Encounter (Signed)
Will refill medication for 3 months, needs follow up appointment with any provider in order to receive any refills.  Thanks,  Tonie Vizcarrondo Castaneda, MD Gastroenterology and Hepatology [facility]  

## 2022-09-09 ENCOUNTER — Other Ambulatory Visit: Payer: Self-pay | Admitting: Family Medicine

## 2022-12-04 ENCOUNTER — Other Ambulatory Visit (INDEPENDENT_AMBULATORY_CARE_PROVIDER_SITE_OTHER): Payer: Self-pay | Admitting: Gastroenterology

## 2022-12-07 ENCOUNTER — Ambulatory Visit (INDEPENDENT_AMBULATORY_CARE_PROVIDER_SITE_OTHER): Payer: PPO | Admitting: Gastroenterology

## 2022-12-11 IMAGING — XA DG ERCP WO/W SPHINCTEROTOMY
1 series · 15 of 22 positions shown · non-contrast
Comparison: MRI/MRCP on 02/04/2021

CLINICAL DATA: Choledocholithiasis.

EXAM:
ERCP
TECHNIQUE: Multiple spot images obtained with the fluoroscopic device and
submitted for interpretation post-procedure.

[Series 1: dg or · 0.20mm/px · 11 acquisitions, 15 frames shown]
[im 1/11]
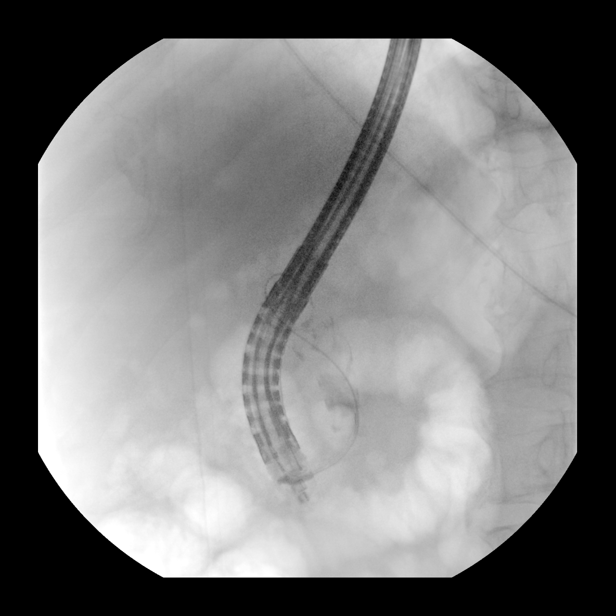
[im 1/11]
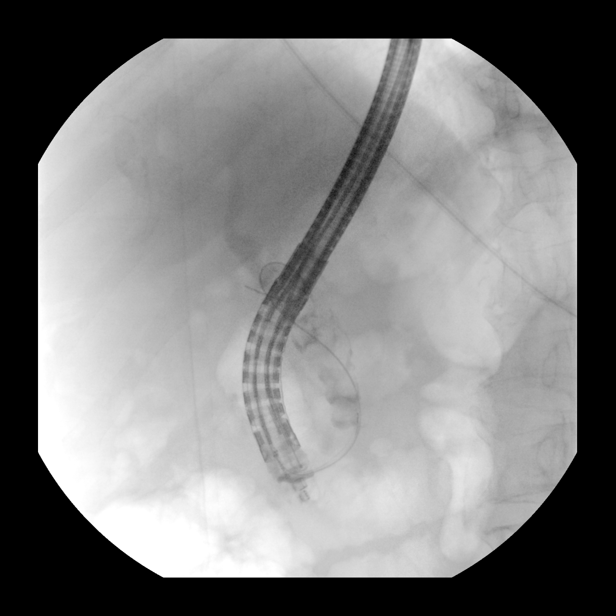
[im 1/11]
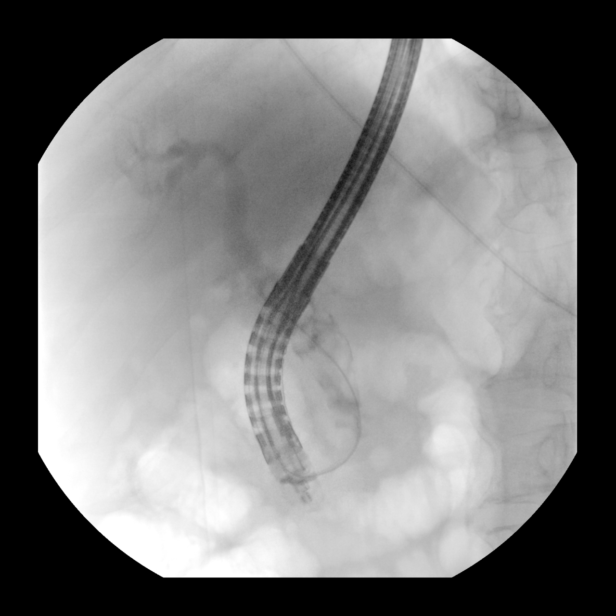
[im 2/11]
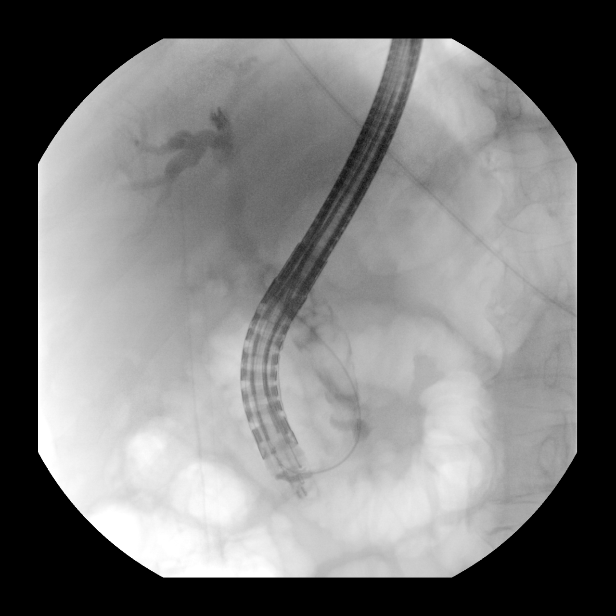
[im 2/11]
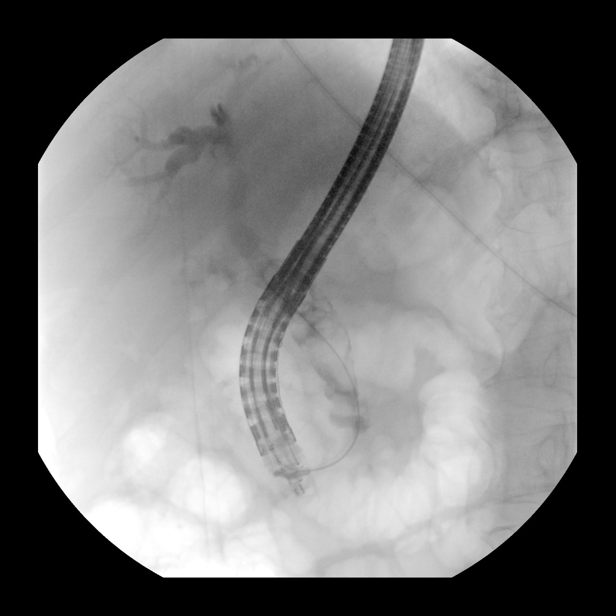
[im 3/11]
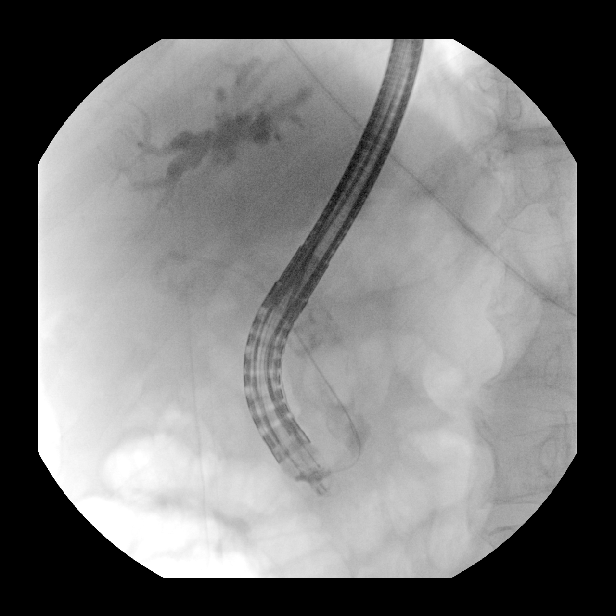
[im 4/11]
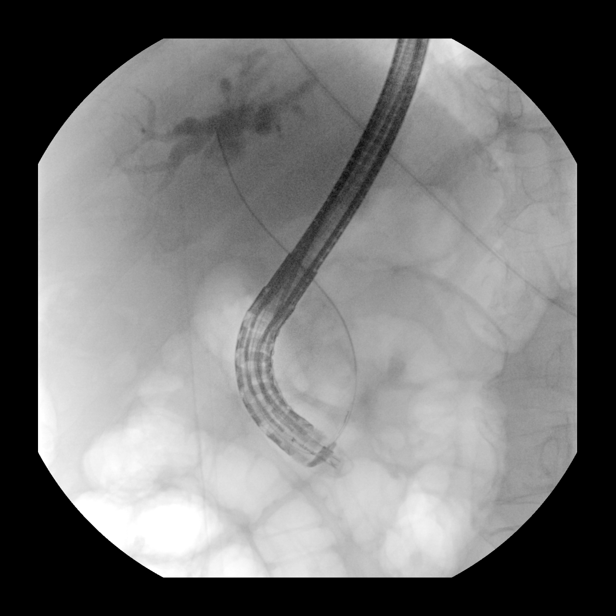
[im 5/11]
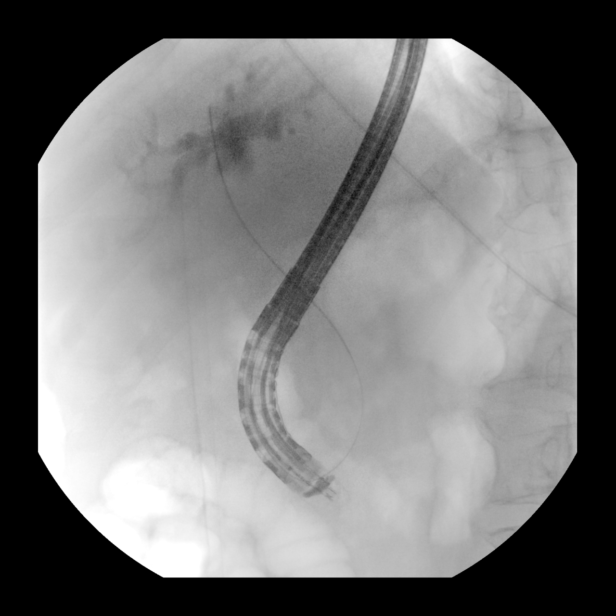
[im 5/11]
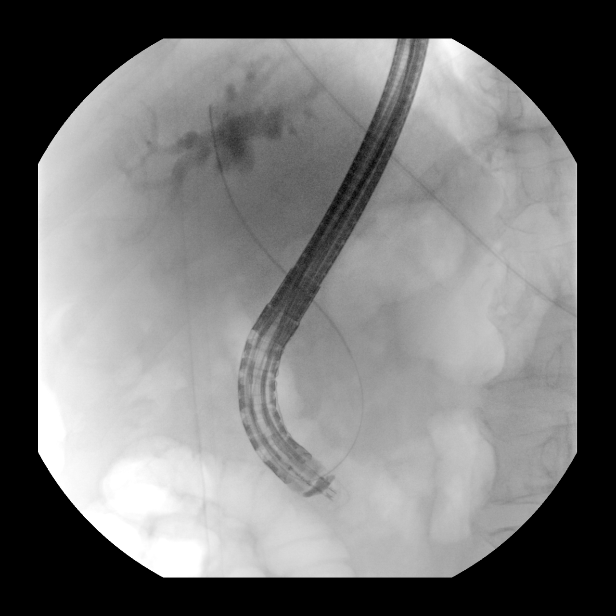
[im 6/11]
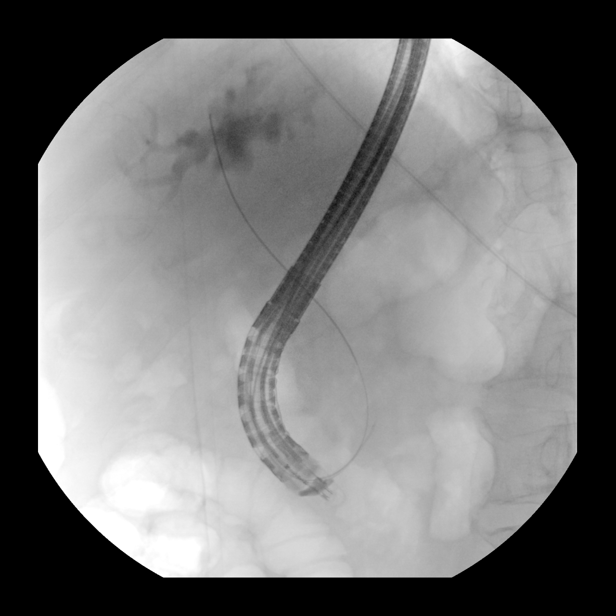
[im 6/11]
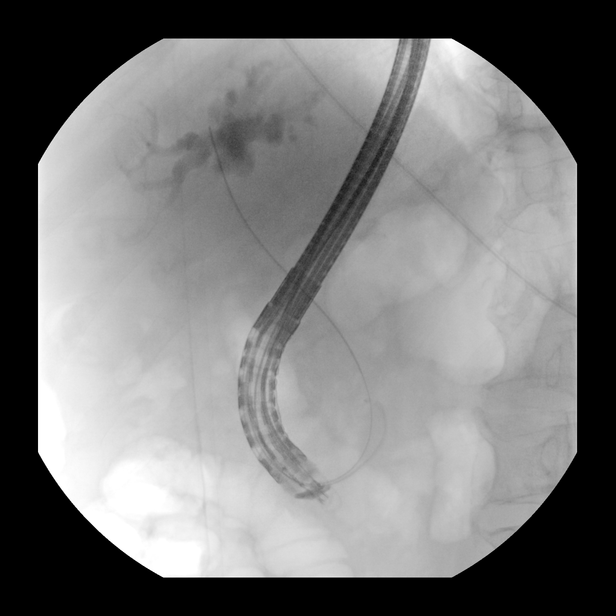
[im 6/11]
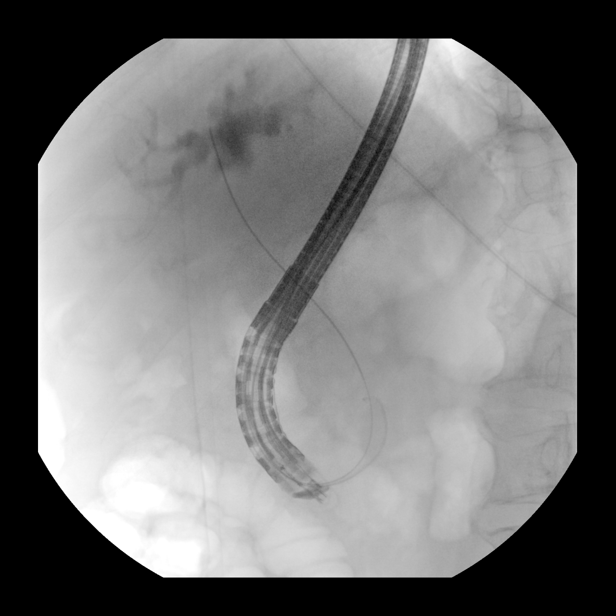
[im 8/11]
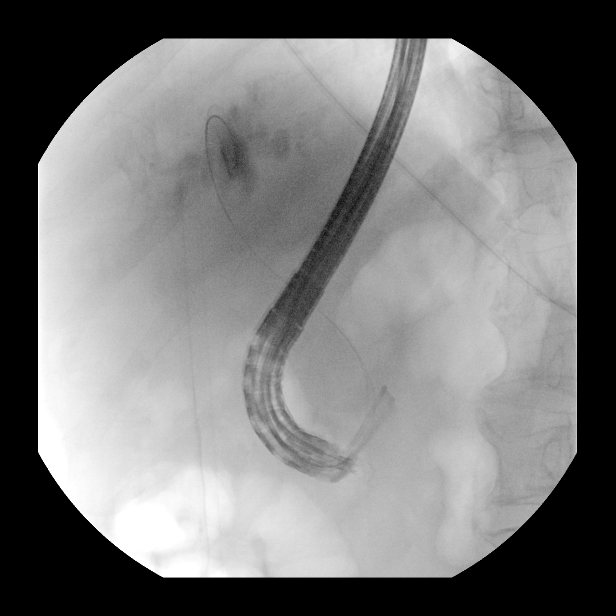
[im 9/11]
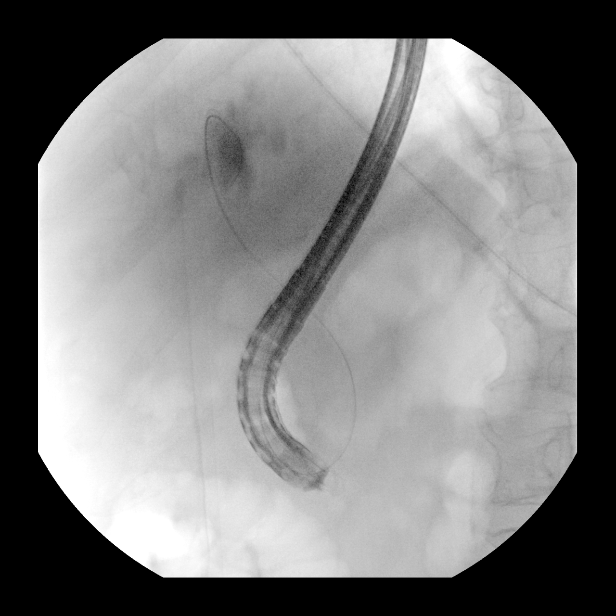
[im 11/11]
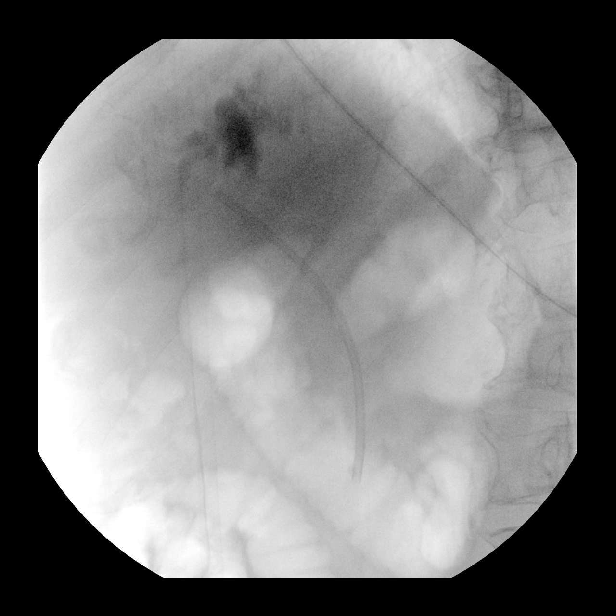

[15 of 22 positions shown; findings below may reference images not displayed]

FINDINGS: Imaging with a C-arm during the procedure demonstrates cannulation
of the common bile duct with contrast injection demonstrating a
diffusely dilated common bile duct packed with a multitude calculi.
Unclear by submitted imaging whether stone extraction was performed.
A biliary stent was placed extending from the confluence of right
and left intrahepatic ducts into the duodenum.
IMPRESSION: Diffusely dilated common bile duct packed with multiple calculi.
During the procedure a biliary stent was placed.

These images were submitted for radiologic interpretation only.
Please see the procedural report for the amount of contrast and the
fluoroscopy time utilized.

## 2022-12-14 IMAGING — CT CT ABD-PELV W/ CM
2 of 5 series · 15 of 46 positions shown, 17 images · IV contrast (Omnipaque or Isovue)
Comparison: MRI February 04, 2021.  Ultrasound December 31, 2020.

CLINICAL DATA: Nausea and vomiting. Rising bilirubin. Known
choledocholithiasis.

EXAM:
CT ABDOMEN AND PELVIS WITH CONTRAST
TECHNIQUE: Multidetector CT imaging of the abdomen and pelvis was performed
using the standard protocol following bolus administration of
intravenous contrast.
CONTRAST:  80mL OMNIPAQUE IOHEXOL 350 MG/ML SOLN

[Series 2: axial st · axial · 0.82mm/px · z∈[+680,+1125]mm · 12 of 101 slices shown, 14 images]
[im 6/101  soft-tissue]
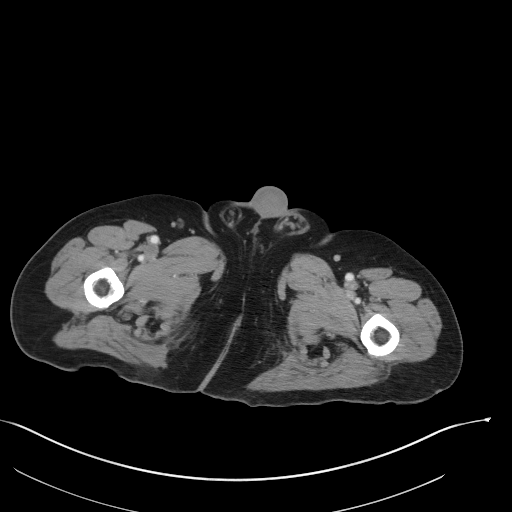
[im 6/101  bone]
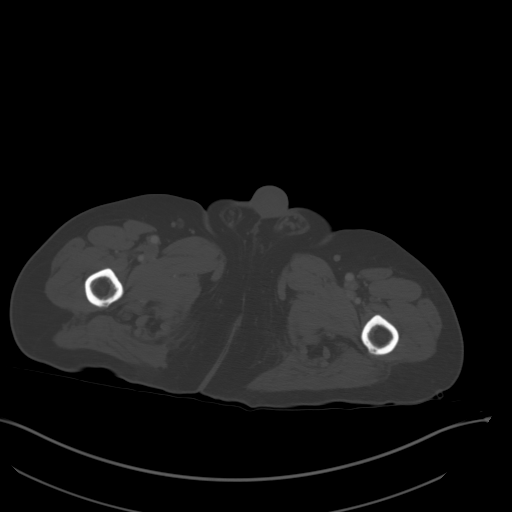
[im 17/101  soft-tissue]
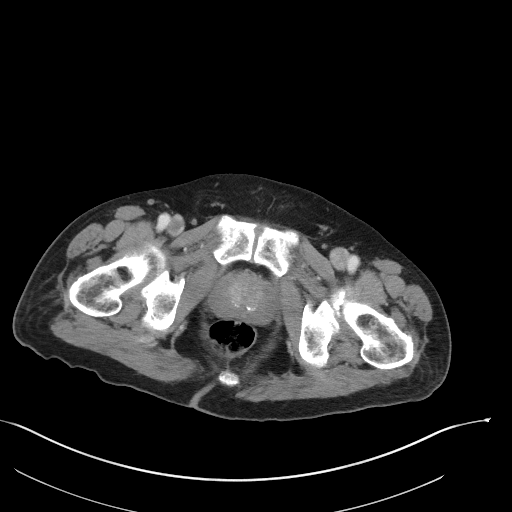
[im 23/101  soft-tissue]
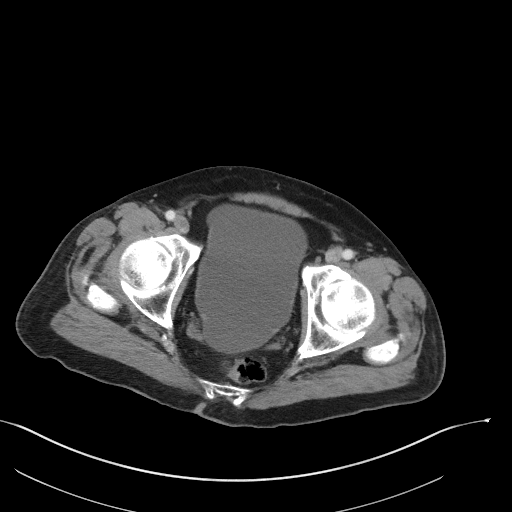
[im 28/101  soft-tissue]
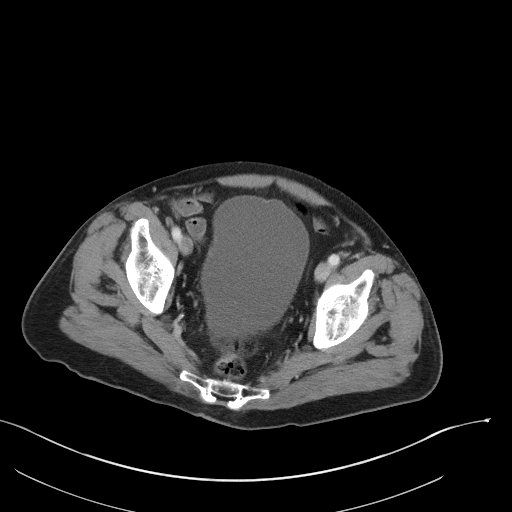
[im 39/101  soft-tissue]
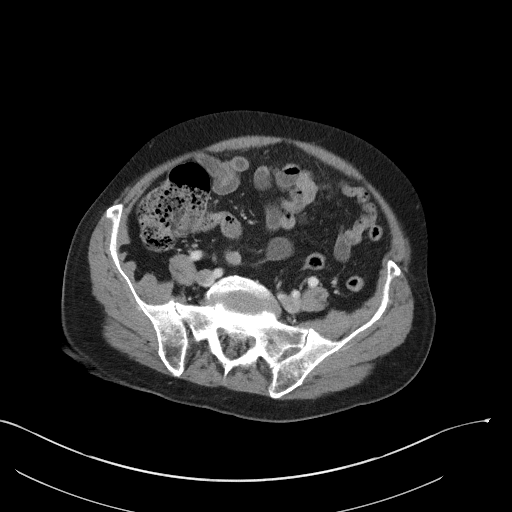
[im 45/101  soft-tissue]
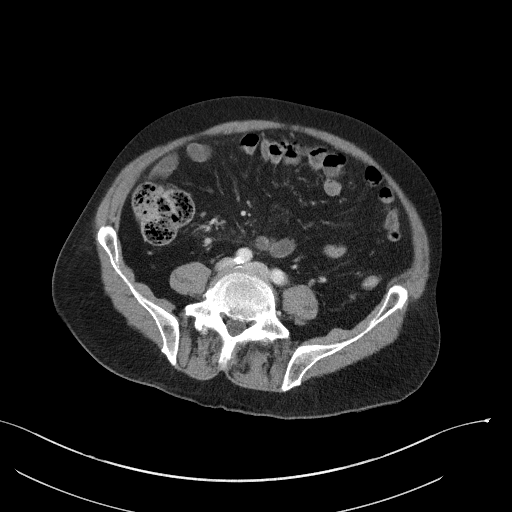
[im 56/101  soft-tissue]
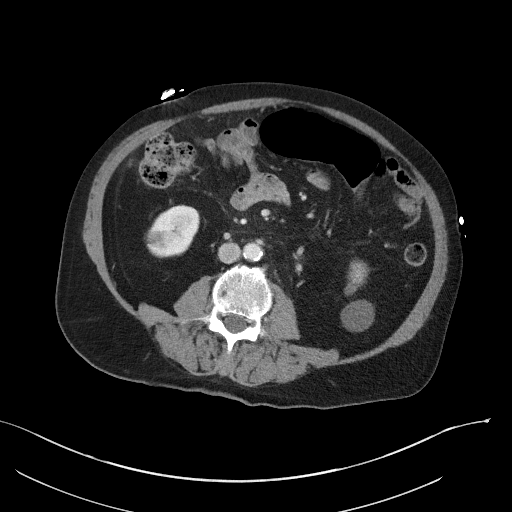
[im 62/101  soft-tissue]
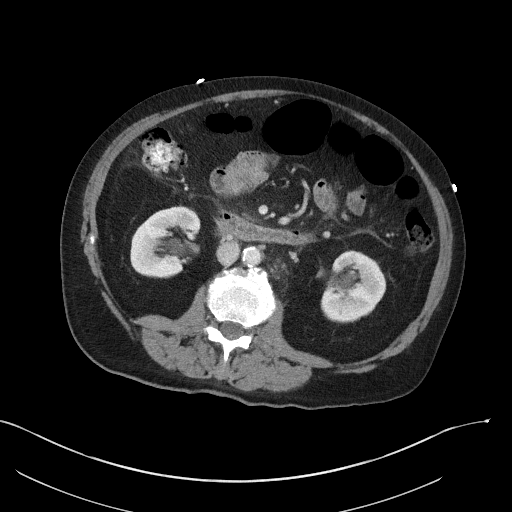
[im 73/101  soft-tissue]
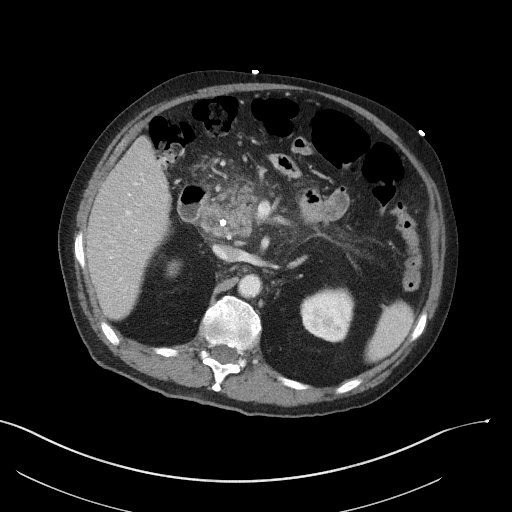
[im 73/101  bone]
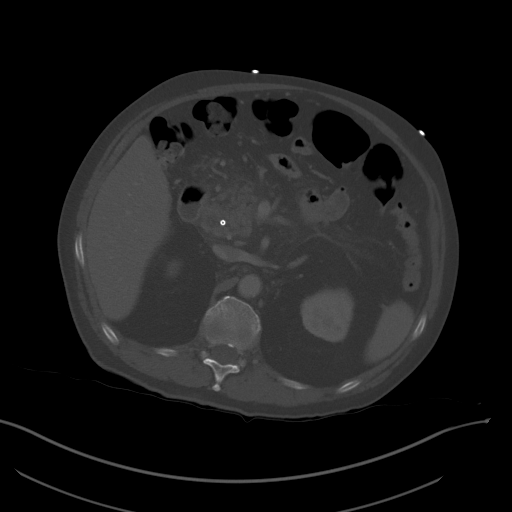
[im 78/101  soft-tissue]
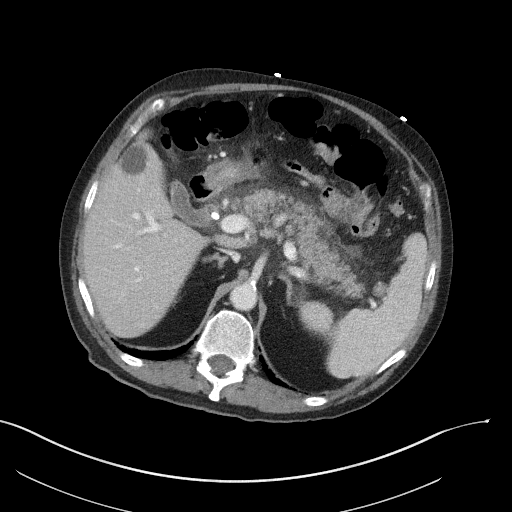
[im 84/101  soft-tissue]
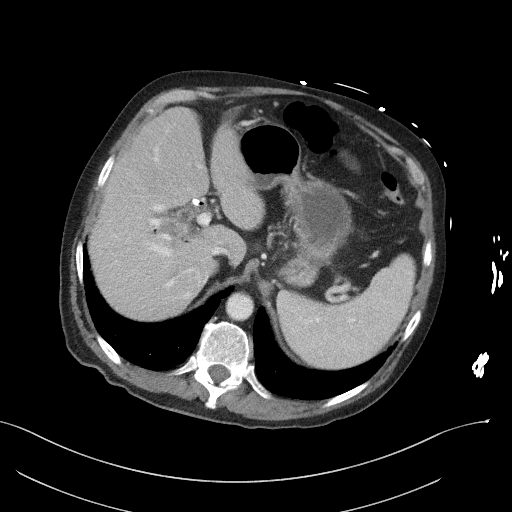
[im 95/101  soft-tissue]
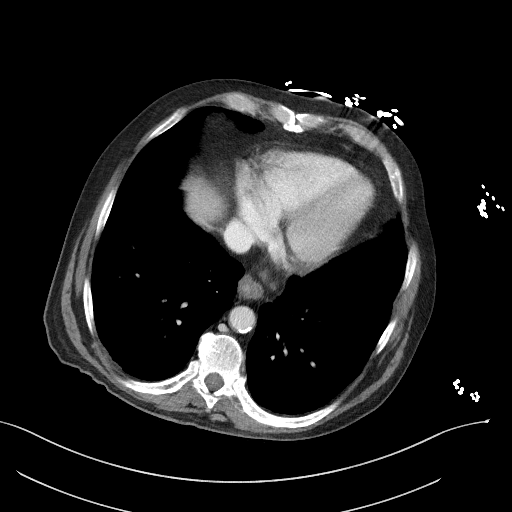

[Series 5: coronal st · coronal · 0.75mm/px · 3 of 112 slices shown]
[im 38/112  soft-tissue]
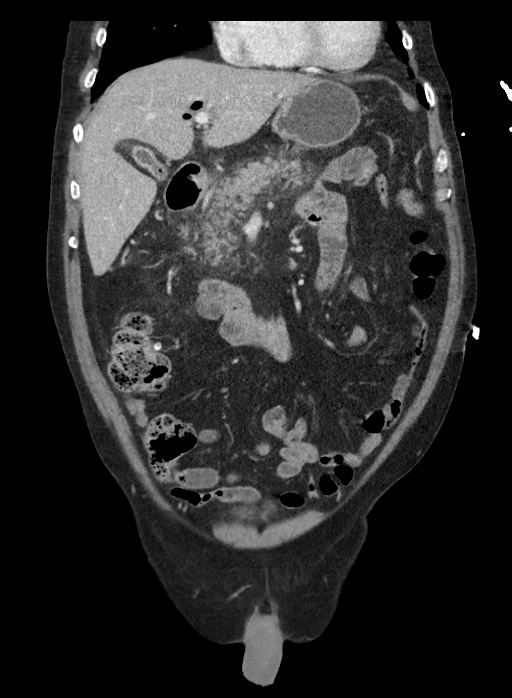
[im 50/112  soft-tissue]
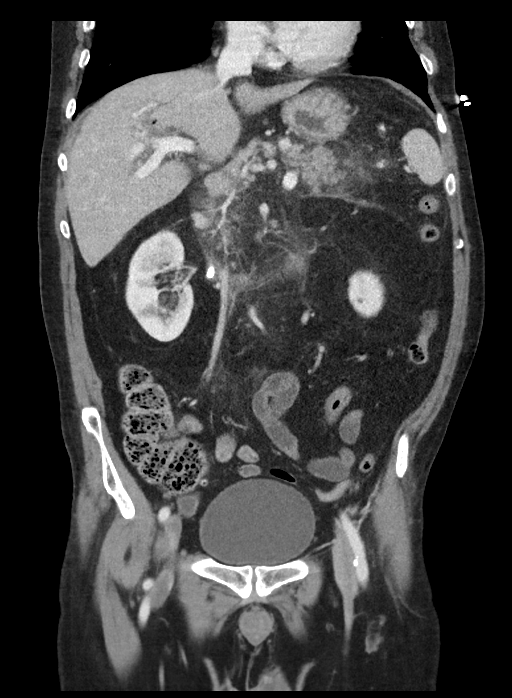
[im 62/112  soft-tissue]
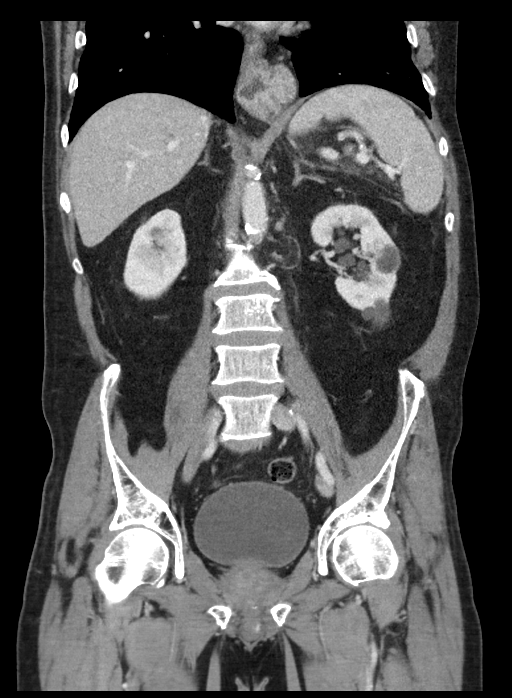

[15 of 46 positions shown; findings below may reference images not displayed]

FINDINGS: Lower chest: There is a small hiatal hernia. No other abnormalities
in the lower lungs or chest.

Hepatobiliary: The gallbladder is relatively contracted with mucosal
enhancement towards the fundus. There is a focus of air in the
gallbladder. A biliary stent is seen in the common bile duct.
Pneumobilia is identified consistent with the stent and recent ERCP.
Mild intrahepatic ductal dilatation remains. No choledocholithiasis
identified on this study. A few scattered low-attenuation lesions in
the liver too small to characterize but likely tiny cysts. The
largest cyst is seen in the right hepatic lobe on axial image 27. No
suspicious liver masses.

Pancreas: There is fat stranding surrounding the length of the
pancreas without fluid collection or pancreatic necrosis identified.

Spleen: Normal in size without focal abnormality.

Adrenals/Urinary Tract: Adrenal glands are normal. Bilateral renal
cysts are identified. Parapelvic cysts are associated with both
kidneys. No hydronephrosis. No ureteral stones are noted. The
bladder is unremarkable.

Stomach/Bowel: Other than the hiatal hernia, the stomach is normal.
The small bowel is normal. The colon and appendix are unremarkable.

Vascular/Lymphatic: Calcified atherosclerosis is seen in the
nonaneurysmal abdominal aorta, extending into the iliac and femoral
vessels. A few reactive nodes are seen in the porta hepatis. No
other abnormal nodes identified.

Reproductive: Prostate is unremarkable.

Other: No abdominal wall hernia or abnormality. No abdominopelvic
ascites.

Musculoskeletal: No acute or significant osseous findings.
IMPRESSION: 1. Diffuse fat stranding around the pancreas most consistent with
pancreatitis. Recommend correlation with lipase.
2. A common bile duct stent is identified without identified
choledocholithiasis. Mild intrahepatic ductal dilatation remains.
Air in the gallbladder and pneumobilia are consistent with recent
ERCP and the common bile duct stent.
3. Mild enhancement in the gallbladder mucosa towards the fundus
suggests hyperemia and probably some inflammation.
4. Small hiatal hernia.
5. Calcified atherosclerosis in the abdominal aorta, iliac vessels,
and femoral vessels.

## 2023-02-10 ENCOUNTER — Telehealth: Payer: Self-pay | Admitting: *Deleted

## 2023-02-10 NOTE — Telephone Encounter (Signed)
I connected with Dean Randolph on 8/14 at [phone] by telephone and verified that I am speaking with the correct person using two identifiers. According to the patient's chart they are due for follow up  with [facility]. Pt will not be seeing dr Darlyn Read anymore Nothing further was needed at the end of our conversation.

## 2024-03-19 ENCOUNTER — Emergency Department (HOSPITAL_COMMUNITY)

## 2024-03-19 ENCOUNTER — Other Ambulatory Visit: Payer: Self-pay

## 2024-03-19 ENCOUNTER — Emergency Department (HOSPITAL_COMMUNITY)
Admission: EM | Admit: 2024-03-19 | Discharge: 2024-03-19 | Disposition: A | Discharge status: Home / Self Care | Attending: Emergency Medicine | Admitting: Emergency Medicine

## 2024-03-19 DIAGNOSIS — Z79899 Other long term (current) drug therapy: Secondary | ICD-10-CM | POA: Insufficient documentation

## 2024-03-19 DIAGNOSIS — R918 Other nonspecific abnormal finding of lung field: Secondary | ICD-10-CM

## 2024-03-19 DIAGNOSIS — R1031 Right lower quadrant pain: Secondary | ICD-10-CM | POA: Insufficient documentation

## 2024-03-19 DIAGNOSIS — R222 Localized swelling, mass and lump, trunk: Secondary | ICD-10-CM

## 2024-03-19 DIAGNOSIS — M545 Low back pain, unspecified: Secondary | ICD-10-CM | POA: Insufficient documentation

## 2024-03-19 DIAGNOSIS — N2889 Other specified disorders of kidney and ureter: Secondary | ICD-10-CM

## 2024-03-19 LAB — COMPREHENSIVE METABOLIC PANEL WITH GFR
ALT: 24 U/L (ref 0–44)
AST: 24 U/L (ref 15–41)
Albumin: 3.6 g/dL (ref 3.5–5.0)
Alkaline Phosphatase: 119 U/L (ref 38–126)
Anion gap: 13 (ref 5–15)
BUN: 22 mg/dL (ref 8–23)
CO2: 21 mmol/L — ABNORMAL LOW (ref 22–32)
Calcium: 9.1 mg/dL (ref 8.9–10.3)
Chloride: 105 mmol/L (ref 98–111)
Creatinine, Ser: 1.11 mg/dL (ref 0.61–1.24)
GFR, Estimated: >60 mL/min (ref >60)
Glucose, Bld: 119 mg/dL — ABNORMAL HIGH (ref 70–99)
Potassium: 3.4 mmol/L — ABNORMAL LOW (ref 3.5–5.1)
Sodium: 139 mmol/L (ref 135–145)
Total Bilirubin: 0.7 mg/dL (ref 0.0–1.2)
Total Protein: 8.1 g/dL (ref 6.5–8.1)

## 2024-03-19 LAB — CBC WITH DIFFERENTIAL/PLATELET
Abs Immature Granulocytes: 0.01 K/uL (ref 0.00–0.07)
Basophils Absolute: 0.1 K/uL (ref 0.0–0.1)
Basophils Relative: 1 %
Eosinophils Absolute: 0 K/uL (ref 0.0–0.5)
Eosinophils Relative: 0 %
HCT: UNDETERMINED % (ref 39.0–52.0)
Hemoglobin: 12.5 g/dL — ABNORMAL LOW (ref 13.0–17.0)
Immature Granulocytes: 0 %
Lymphocytes Relative: 13 %
Lymphs Abs: 0.9 K/uL (ref 0.7–4.0)
MCH: UNDETERMINED pg (ref 26.0–34.0)
MCHC: UNDETERMINED g/dL (ref 30.0–36.0)
MCV: UNDETERMINED fL (ref 80.0–100.0)
Monocytes Absolute: 0.6 K/uL (ref 0.1–1.0)
Monocytes Relative: 9 %
Neutro Abs: 5.3 K/uL (ref 1.7–7.7)
Neutrophils Relative %: 77 %
Platelets: 220 K/uL (ref 150–400)
RBC: UNDETERMINED MIL/uL (ref 4.22–5.81)
Smear Review: NORMAL
WBC: 6.9 K/uL (ref 4.0–10.5)
nRBC: 0 % (ref 0.0–0.2)

## 2024-03-19 LAB — URINALYSIS, ROUTINE W REFLEX MICROSCOPIC
Bilirubin Urine: NEGATIVE
Glucose, UA: NEGATIVE mg/dL
Hgb urine dipstick: NEGATIVE
Ketones, ur: NEGATIVE mg/dL
Leukocytes,Ua: NEGATIVE
Nitrite: NEGATIVE
Protein, ur: NEGATIVE mg/dL
Specific Gravity, Urine: 1.015 (ref 1.005–1.030)
pH: 6 (ref 5.0–8.0)

## 2024-03-19 LAB — LIPASE, BLOOD: Lipase: 34 U/L (ref 11–51)

## 2024-03-19 MED ORDER — IOHEXOL 300 MG/ML  SOLN
100.0000 mL | Freq: Once | INTRAMUSCULAR | Status: AC | PRN
  Administered 2024-03-19: 100 mL via INTRAVENOUS

## 2024-03-19 MED ORDER — IOHEXOL 300 MG/ML  SOLN
50.0000 mL | Freq: Once | INTRAMUSCULAR | Status: AC | PRN
  Administered 2024-03-19: 50 mL via INTRAVENOUS

## 2024-03-19 MED ORDER — HYDROCODONE-ACETAMINOPHEN 5-325 MG PO TABS: 1.0000 | ORAL_TABLET | Freq: Four times a day (QID) | ORAL | 0 refills | Status: DC | PRN

## 2024-03-19 NOTE — ED Notes (Signed)
 Gave patient urinal and requested that he call if and when he is able to produce urine.

## 2024-03-19 NOTE — Discharge Instructions (Addendum)
 Please follow-up closely with oncology on an outpatient basis.  Referral has been placed but please contact the office to ensure that you have an appointment next week.  Return to emergency department immediately for any new or worsening symptoms.  Pain medication has been provided as needed for pain.  Please do not drive or operate heavy machinery while taking this medication.

## 2024-03-19 NOTE — ED Triage Notes (Signed)
 Patient arrives POV c/c low back pain. Hurts all the time.

## 2024-03-19 NOTE — ED Provider Notes (Signed)
 [place] EMERGENCY DEPARTMENT AT [facility] Provider Note   CSN: [id number] Arrival date & time: 03/19/24  9141     Patient presents with: Back Pain   Dean Randolph is a 83 y.o. male.   Patient is an 83 year old male who presents emergency department the chief complaint of right sided back, flank and right lower quadrant abdominal pain which has been ongoing for over the past week.  He notes that the pain is worse with certain movements.  He notes that nothing seems to make the pain better.  He notes that he does have a history of previous choledocholithiasis, cholangitis.  He denies any vomiting, diarrhea, constipation.  He has had no dysuria or hematuria.  He denies any pain to his testicles.  There has been no radiation of the pain to his lower extremities.  He has had no urinary bowel incontinence, saddle paresthesias or gait changes.  He has had no recent falls or blunt trauma.   Back Pain      Prior to Admission medications   Medication Sig Start Date End Date Taking? Authorizing Provider  amLODipine  (NORVASC ) 5 MG tablet Take 1 tablet (5 mg total) by mouth daily. (NEEDS TO BE SEEN BEFORE NEXT REFILL) 06/07/22   Zollie Lowers, MD  lactose free nutrition (BOOST) LIQD Take 237 mLs by mouth every other day.    [provider]  Multiple Vitamins-Minerals (MULTIVITAMIN WITH MINERALS) tablet Take 1 tablet by mouth 2 (two) times a week. Centrum Silver    [provider]  pantoprazole  (PROTONIX ) 40 MG tablet TAKE 1 TABLET BY MOUTH EVERY DAY BEFORE BREAKFAST 09/07/22   Eartha Angelia Sieving, MD    Allergies: Patient has no known allergies.    Review of Systems  Musculoskeletal:  Positive for back pain.  All other systems reviewed and are negative.   Updated Vital Signs BP (!) 155/94 (BP Location: Left Arm)   Pulse 97   Temp 97.8 F (36.6 C) (Oral)   Resp 17   Ht 5' 7 (1.702 m)   Wt 74.8 kg   SpO2 98%   BMI 25.84 kg/m   Physical  Exam Vitals and nursing note reviewed.  Constitutional:      General: He is not in acute distress.    Appearance: Normal appearance. He is not ill-appearing.  HENT:     Head: Normocephalic and atraumatic.     Nose: Nose normal.     Mouth/Throat:     Mouth: Mucous membranes are moist.  Eyes:     Extraocular Movements: Extraocular movements intact.     Conjunctiva/sclera: Conjunctivae normal.     Pupils: Pupils are equal, round, and reactive to light.  Cardiovascular:     Rate and Rhythm: Normal rate and regular rhythm.     Pulses: Normal pulses.     Heart sounds: Normal heart sounds. No murmur heard.    No gallop.  Pulmonary:     Effort: Pulmonary effort is normal. No respiratory distress.     Breath sounds: Normal breath sounds. No stridor. No wheezing, rhonchi or rales.  Abdominal:     General: Abdomen is flat. Bowel sounds are normal. There is no distension.     Palpations: Abdomen is soft.     Tenderness: There is no abdominal tenderness. There is no right CVA tenderness, left CVA tenderness or guarding.  Musculoskeletal:        General: No swelling, tenderness, deformity or signs of injury. Normal range of motion.  Cervical back: Normal range of motion and neck supple. No rigidity or tenderness.     Comments: Nontender to palpation over midline thoracic or lumbar spine, no step-off or deformity  Skin:    General: Skin is warm and dry.  Neurological:     General: No focal deficit present.     Mental Status: He is alert and oriented to person, place, and time. Mental status is at baseline.     Cranial Nerves: No cranial nerve deficit.     Sensory: No sensory deficit.     Motor: No weakness.     Coordination: Coordination normal.     Gait: Gait normal.  Psychiatric:        Mood and Affect: Mood normal.        Behavior: Behavior normal.        Thought Content: Thought content normal.        Judgment: Judgment normal.     (all labs ordered are listed, but only  abnormal results are displayed) Labs Reviewed  COMPREHENSIVE METABOLIC PANEL WITH GFR  LIPASE, BLOOD  CBC WITH DIFFERENTIAL/PLATELET  URINALYSIS, ROUTINE W REFLEX MICROSCOPIC    EKG: None  Radiology: No results found.   Procedures   Medications Ordered in the ED - No data to display                                  Medical Decision Making Amount and/or Complexity of Data Reviewed Labs: ordered. Radiology: ordered.  Risk Prescription drug management.   This patient presents to the ED for concern of abdominal pain differential diagnosis includes acute appendicitis, cholecystitis, subluxation, diverticulitis, testicular torsion, pyonephritis, kidney stone, pancreatitis, mesenteric ischemia, malignancy    Additional history obtained:  Additional history obtained from spouse External records from outside source obtained and reviewed including medical records   Lab Tests:  I Ordered, and personally interpreted labs.  The pertinent results include: No leukocytosis, no anemia, no thrombocytopenia, mild hypokalemia, normal kidney function liver function, negative lipase, unremarkable urinalysis   Imaging Studies ordered:  I ordered imaging studies including CT scan chest, CT abdomen and pelvis I independently visualized and interpreted imaging which showed pulmonary nodules, bilateral renal masses, necrotic soft tissue mass in the retroperitoneal space, left periaortic nodular mass, lytic lesions in the L1, L2 vertebral bodies and right ischial tuberosity I agree with the radiologist interpretation    Problem List / ED Course:  Patient is doing well at this time.  He does remain pain-free while in the emergency department.  CT scan findings were fully discussed with Dr. Davonna with oncology who did review the images and workup.  She does note that as long as the patient's pain is well-controlled he can be discharged home and she will see him in the office next week for  further workup of his apparent malignancy.  This was fully discussed with patient and family and stressed the importance of close follow-up with oncology.  Blood work has been overall unremarkable at this time.  Given the fact that the patient is pain-free do not suspect that admission is warranted at this time.  Have placed ambulatory referral for oncology and information for Dr. Davonna was provided to the family.  They will follow-up closely on an outpatient basis.  Strict turn precautions were discussed for any new or worsening symptoms.  Patient and family voiced understanding to the plan and had no additional questions.  Patient was fully evaluated by attending physician who is in agreement to plan at this time.   Social Determinants of Health:  None        Final diagnoses:  None    ED Discharge Orders     None          Daralene Lonni JONETTA DEVONNA 03/19/24 1449    Dean Clarity, MD 03/19/24 [phone]

## 2024-03-23 ENCOUNTER — Inpatient Hospital Stay

## 2024-03-23 ENCOUNTER — Inpatient Hospital Stay: Attending: Oncology | Admitting: Oncology

## 2024-03-23 ENCOUNTER — Other Ambulatory Visit: Payer: Self-pay

## 2024-03-23 ENCOUNTER — Encounter: Payer: Self-pay | Admitting: Oncology

## 2024-03-23 VITALS — BP 143/85 | HR 97 | Temp 97.8°F | Resp 18 | Wt 164.2 lb

## 2024-03-23 DIAGNOSIS — M545 Low back pain, unspecified: Secondary | ICD-10-CM | POA: Diagnosis not present

## 2024-03-23 DIAGNOSIS — E876 Hypokalemia: Secondary | ICD-10-CM | POA: Insufficient documentation

## 2024-03-23 DIAGNOSIS — D649 Anemia, unspecified: Secondary | ICD-10-CM | POA: Insufficient documentation

## 2024-03-23 DIAGNOSIS — N2889 Other specified disorders of kidney and ureter: Secondary | ICD-10-CM

## 2024-03-23 DIAGNOSIS — Z803 Family history of malignant neoplasm of breast: Secondary | ICD-10-CM | POA: Insufficient documentation

## 2024-03-23 DIAGNOSIS — R918 Other nonspecific abnormal finding of lung field: Secondary | ICD-10-CM | POA: Diagnosis not present

## 2024-03-23 DIAGNOSIS — M549 Dorsalgia, unspecified: Secondary | ICD-10-CM | POA: Insufficient documentation

## 2024-03-23 LAB — COMPREHENSIVE METABOLIC PANEL WITH GFR
ALT: 20 U/L (ref 0–44)
AST: 20 U/L (ref 15–41)
Albumin: 3.6 g/dL (ref 3.5–5.0)
Alkaline Phosphatase: 116 U/L (ref 38–126)
Anion gap: 11 (ref 5–15)
BUN: 26 mg/dL — ABNORMAL HIGH (ref 8–23)
CO2: 23 mmol/L (ref 22–32)
Calcium: 9.1 mg/dL (ref 8.9–10.3)
Chloride: 103 mmol/L (ref 98–111)
Creatinine, Ser: 1.04 mg/dL (ref 0.61–1.24)
GFR, Estimated: >60 mL/min (ref >60)
Glucose, Bld: 120 mg/dL — ABNORMAL HIGH (ref 70–99)
Potassium: 4.1 mmol/L (ref 3.5–5.1)
Sodium: 137 mmol/L (ref 135–145)
Total Bilirubin: 0.5 mg/dL (ref 0.0–1.2)
Total Protein: 7.9 g/dL (ref 6.5–8.1)

## 2024-03-23 LAB — CBC WITH DIFFERENTIAL/PLATELET
Abs Immature Granulocytes: 0.01 K/uL (ref 0.00–0.07)
Basophils Absolute: 0.1 K/uL (ref 0.0–0.1)
Basophils Relative: 1 %
Eosinophils Absolute: 0.1 K/uL (ref 0.0–0.5)
Eosinophils Relative: 1 %
HCT: 32.3 % — ABNORMAL LOW (ref 39.0–52.0)
Hemoglobin: 11.9 g/dL — ABNORMAL LOW (ref 13.0–17.0)
Immature Granulocytes: 0 %
Lymphocytes Relative: 14 %
Lymphs Abs: 1.2 K/uL (ref 0.7–4.0)
MCH: 38.6 pg — ABNORMAL HIGH (ref 26.0–34.0)
MCHC: 36.8 g/dL — ABNORMAL HIGH (ref 30.0–36.0)
MCV: 104.9 fL — ABNORMAL HIGH (ref 80.0–100.0)
Monocytes Absolute: 0.7 K/uL (ref 0.1–1.0)
Monocytes Relative: 8 %
Neutro Abs: 6.8 K/uL (ref 1.7–7.7)
Neutrophils Relative %: 76 %
Platelets: 238 K/uL (ref 150–400)
RBC: 3.08 MIL/uL — ABNORMAL LOW (ref 4.22–5.81)
RDW: 20.1 % — ABNORMAL HIGH (ref 11.5–15.5)
WBC: 8.8 K/uL (ref 4.0–10.5)
nRBC: 0 % (ref 0.0–0.2)

## 2024-03-23 MED ORDER — HYDROCODONE-ACETAMINOPHEN 5-325 MG PO TABS: 1.0000 | ORAL_TABLET | Freq: Four times a day (QID) | ORAL | 0 refills | Status: DC | PRN

## 2024-03-23 NOTE — Addendum Note (Signed)
 Addended by: Brei Pociask on: 03/23/2024 03:16 PM   Modules accepted: Level of Service

## 2024-03-23 NOTE — Assessment & Plan Note (Addendum)
 Discussed case with Dr. Davonna.  Plan is for PET scan followed by IR biopsy. Return to clinic after biopsy to review PET scan and biopsy results. We will also get repeat lab work today.

## 2024-03-23 NOTE — Progress Notes (Unsigned)
 Hughes Simmonds, MD  [name] PROCEDURE / BIOPSY REVIEW Date: 03/23/24  Requested Biopsy site: L RP Reason for request: mass. Suspect L RCC Imaging review: Best seen on CT AP  Decision: Approved Imaging modality to perform: CT Schedule with: Moderate Sedation Schedule for: Any VIR  Additional comments: @VIR : likely LN, send a sample in NS @Schedulers . CT L RP mass Bx.  Please contact me with questions, concerns, or if issue pertaining to this request arise.  Simmonds Hughes, MD [facility]       Previous Messages    ----- Message ----- From: [name] Sent: 03/23/2024   9:05 AM EDT To: Rosella CROME Baldwin; Taryn F Rigney, RT; Ir Proc* Subject: CT BIOPSY                                      Procedure :CT BIOPSY  Reason :possible RCC, please biopsy abdominal soft tissue mass if possible Dx: Kidney mass [W71.10 (ICD-10-CM)]    History :CT Chest W Contrast,CT ABDOMEN PELVIS W CONTRAST,  Provider: Geofm Delon BRAVO, NP  Provider contact ; [phone]   Patient is scheduled for a NM PET Image Initial (PI) Skull Base To Thigh on October 2

## 2024-03-23 NOTE — Progress Notes (Signed)
 [facility] [phone]   [phone]  INITIAL CONSULTATION:  Patient Care Team: Pcp, No as PCP - General Geofm Dean BRAVO, NP as Nurse Practitioner (Nurse Practitioner) Celestia Joesph SQUIBB, RN as Oncology Nurse Navigator (Medical Oncology) Delores Dena RAMAN, RN as Oncology Nurse Navigator (Medical Oncology)  CHIEF COMPLAINTS/PURPOSE OF CONSULTATION:  Renal mass  HISTORY OF PRESENTING ILLNESS:  Dean Randolph 83 y.o. male with medical history significant for hypertension GERD, status postcholecystectomy, iron deficiency anemia and pancreatitis due to biliary obstruction status post ERCP with stent placement who was recently evaluated at the [facility] emergency room for right sided back, flank and lower abdominal pain for about a week.  Workup included labs, urinalysis and imaging.  UA showed moderate amount of blood and protein.  Labs revealed mild hypokalemia and elevated blood sugar but otherwise normal kidney and liver function.  CBC showed mild anemia 12.5.  Differential was normal.  CT CAP showed a 4.1 x 3.4 cm mass in the anterior left kidney with enhancing central soft tissue nodules.  A 16 mm potential enhancing nodule in the anterior right renal hilum.  Imaging features are highly suspicious for renal cell carcinoma.  There was also a irregular central necrotic soft tissue mass in the retroperitoneal space just inferior to the SMA and obscuring the left renal vein.  Numerous new pulmonary nodules in the lung bases measuring up to 6 mm highly suspicious for metastatic disease.  Possible lytic lesions in the L1 and L2 vertebral bodies and posterior right tuberosity consistent with metastatic disease.  Patient was given pain medicine and instructed to follow-up with oncology.  On exam today patient is doing relatively well.  He continues to have intermittent low back pain but is taking Norco as prescribed which is helping.  He has  been rotating that with Tylenol .  He reports family history of breast cancer (mother and aunt) but no other malignancies that he is aware of.  His wife is currently being treated for leukemia.  Reports he has always been relatively healthy.  MEDICAL HISTORY:  Past Medical History:  Diagnosis Date   Hypertension     SURGICAL HISTORY: Past Surgical History:  Procedure Laterality Date   BILIARY DILATION  03/12/2022   Procedure: BILIARY DILATION;  Surgeon: Mansouraty, Aloha Raddle., MD;  Location: [facility];  Service: Gastroenterology;;   BILIARY STENT PLACEMENT N/A 02/06/2021   Procedure: BILIARY STENT PLACEMENT;  Surgeon: Golda Claudis PENNER, MD;  Location: [facility];  Service: Gastroenterology;  Laterality: N/A;   BILIARY STENT PLACEMENT N/A 03/26/2021   Procedure: BILIARY STENT PLACEMENT 10 FRENCH BY 9cm;  Surgeon: Golda Claudis PENNER, MD;  Location: [facility];  Service: Endoscopy;  Laterality: N/A;   BILIARY STENT PLACEMENT N/A 01/19/2022   Procedure: BILIARY STENT PLACEMENT;  Surgeon: Wilhelmenia Aloha Raddle., MD;  Location: [facility];  Service: Gastroenterology;  Laterality: N/A;   BIOPSY  01/19/2022   Procedure: BIOPSY;  Surgeon: Wilhelmenia Aloha Raddle., MD;  Location: [facility];  Service: Gastroenterology;;   CATARACT EXTRACTION Bilateral    CHOLECYSTECTOMY N/A 04/25/2021   Procedure: LAPAROSCOPIC CHOLECYSTECTOMY;  Surgeon: Mavis Anes, MD;  Location: [facility];  Service: General;  Laterality: N/A;   ENDOSCOPIC RETROGRADE CHOLANGIOPANCREATOGRAPHY (ERCP) WITH PROPOFOL  N/A 01/19/2022   Procedure: ENDOSCOPIC RETROGRADE CHOLANGIOPANCREATOGRAPHY (ERCP) WITH PROPOFOL ;  Surgeon: Wilhelmenia Aloha Raddle., MD;  Location: [facility];  Service: Gastroenterology;  Laterality: N/A;   ENDOSCOPIC RETROGRADE CHOLANGIOPANCREATOGRAPHY (ERCP) WITH PROPOFOL  N/A 03/12/2022   Procedure: ENDOSCOPIC  RETROGRADE CHOLANGIOPANCREATOGRAPHY (ERCP) WITH PROPOFOL ;  Surgeon: Wilhelmenia Aloha Raddle., MD;  Location: [facility];   Service: Gastroenterology;  Laterality: N/A;   ERCP N/A 02/06/2021   Procedure: ENDOSCOPIC RETROGRADE CHOLANGIOPANCREATOGRAPHY (ERCP);  Surgeon: Golda Claudis PENNER, MD;  Location: [facility];  Service: Gastroenterology;  Laterality: N/A;   ERCP N/A 03/26/2021   Procedure: ENDOSCOPIC RETROGRADE CHOLANGIOPANCREATOGRAPHY (ERCP) with EXTENDED SPHINCTEROTOMY;  Surgeon: Golda Claudis PENNER, MD;  Location: [facility];  Service: Endoscopy;  Laterality: N/A;   ERCP N/A 10/27/2021   Procedure: ENDOSCOPIC RETROGRADE CHOLANGIOPANCREATOGRAPHY (ERCP);  Surgeon: Golda Claudis PENNER, MD;  Location: [facility];  Service: Endoscopy;  Laterality: N/A;  no site    GASTROINTESTINAL STENT REMOVAL N/A 03/26/2021   Procedure: GASTROINTESTINAL STENT REMOVAL;  Surgeon: Golda Claudis PENNER, MD;  [facility]: [facility];  Service: Endoscopy;  Laterality: N/A;   GASTROINTESTINAL STENT REMOVAL N/A 10/27/2021   Procedure: GASTROINTESTINAL STENT EXCHANGE;  Surgeon: Golda Claudis PENNER, MD;  Location: [facility];  Service: Endoscopy;  Laterality: N/A;  no site    REMOVAL OF STONES N/A 03/26/2021   Procedure: REMOVAL OF STONES WITH EXTRACTION BASKET;  Surgeon: Golda Claudis PENNER, MD;  Location: [facility];  Service: Endoscopy;  Laterality: N/A;   REMOVAL OF STONES N/A 10/27/2021   Procedure: REMOVAL OF STONES;  Surgeon: Golda Claudis PENNER, MD;  [facility]: [facility];  Service: Endoscopy;  Laterality: N/A;   REMOVAL OF STONES  01/19/2022   Procedure: REMOVAL OF STONES;  Surgeon: Wilhelmenia Aloha Raddle., MD;  Location: [facility];  Service: Gastroenterology;;   REMOVAL OF STONES  03/12/2022   Procedure: REMOVAL OF STONES;  Surgeon: Wilhelmenia Aloha Raddle., MD;  Location: [facility];  Service: Gastroenterology;;   ANNETT N/A 02/06/2021   Procedure: ANNETT;  Surgeon: Golda Claudis PENNER, MD;  Location: [facility];  Service: Gastroenterology;  Laterality: N/A;   SPHINCTEROTOMY N/A 10/27/2021   Procedure: SPHINCTEROTOMY;  Surgeon: Golda Claudis PENNER, MD;  Location: [facility];  Service:  Endoscopy;  Laterality: N/A;   SPYGLASS CHOLANGIOSCOPY N/A 01/19/2022   Procedure: SPYGLASS CHOLANGIOSCOPY;  Surgeon: Wilhelmenia Aloha Raddle., MD;  Location: [facility];  Service: Gastroenterology;  Laterality: N/A;   SPYGLASS CHOLANGIOSCOPY N/A 03/12/2022   Procedure: DEBHOJDD CHOLANGIOSCOPY;  Surgeon: Wilhelmenia Aloha Raddle., MD;  Location: [facility];  Service: Gastroenterology;  Laterality: N/A;   SPYGLASS LITHOTRIPSY N/A 03/12/2022   Procedure: DEBHOJDD LITHOTRIPSY;  Surgeon: Wilhelmenia Aloha Raddle., MD;  Location: [facility];  Service: Gastroenterology;  Laterality: N/A;   STENT REMOVAL  01/19/2022   Procedure: STENT REMOVAL;  Surgeon: Wilhelmenia Aloha Raddle., MD;  Location: [facility];  Service: Gastroenterology;;   CLEDA REMOVAL  03/12/2022   Procedure: STENT REMOVAL;  Surgeon: Wilhelmenia Aloha Raddle., MD;  Location: [facility];  Service: Gastroenterology;;   STONE EXTRACTION WITH BASKET  01/19/2022   Procedure: STONE EXTRACTION WITH BASKET;  Surgeon: Wilhelmenia Aloha Raddle., MD;  Location: [facility];  Service: Gastroenterology;;    SOCIAL HISTORY: Social History   Socioeconomic History   Marital status: Married    Spouse name: Not on file   Number of children: Not on file   Years of education: Not on file   Highest education level: Not on file  Occupational History   Occupation: electrician    Comment: retired  Tobacco Use   Smoking status: Never    Passive exposure: Past   Smokeless tobacco: Never  Vaping Use   Vaping status: Never Used  Substance and Sexual Activity   Alcohol use: Never   Drug use: Never  Sexual activity: Yes  Other Topics Concern   Not on file  Social History Narrative   Not on file   Social Drivers of Health   Financial Resource Strain: Not on file  Food Insecurity: No Food Insecurity (03/23/2024)   Hunger Vital Sign    Worried About Running Out of Food in the Last Year: Never true    Ran Out of Food in the Last Year: Never true   Transportation Needs: No Transportation Needs (03/23/2024)   PRAPARE - Administrator, Civil Service (Medical): No    Lack of Transportation (Non-Medical): No  Physical Activity: Not on file  Stress: Not on file  Social Connections: Not on file  Intimate Partner Violence: Not At Risk (03/23/2024)   Humiliation, Afraid, Rape, and Kick questionnaire    Fear of Current or Ex-Partner: No    Emotionally Abused: No    Physically Abused: No    Sexually Abused: No    FAMILY HISTORY: Family History  Problem Relation Age of Onset   Stroke Mother    Colon cancer Neg Hx    Esophageal cancer Neg Hx    Inflammatory bowel disease Neg Hx    Liver disease Neg Hx    Pancreatic cancer Neg Hx    Rectal cancer Neg Hx    Stomach cancer Neg Hx     ALLERGIES:  has no known allergies.  MEDICATIONS:  Current Outpatient Medications  Medication Sig Dispense Refill   HYDROcodone -acetaminophen  (NORCO/VICODIN) 5-325 MG tablet Take 1 tablet by mouth every 6 (six) hours as needed for severe pain (pain score 7-10). 30 tablet 0   No current facility-administered medications for this visit.    REVIEW OF SYSTEMS:   Constitutional: ( - ) fevers, ( - )  chills , ( - ) night sweats Eyes: ( - ) blurriness of vision, ( - ) double vision, ( - ) watery eyes Ears, nose, mouth, throat, and face: ( - ) mucositis, ( - ) sore throat Respiratory: ( - ) cough, ( - ) dyspnea, ( - ) wheezes Cardiovascular: ( - ) palpitation, ( - ) chest discomfort, ( - ) lower extremity swelling Gastrointestinal:  ( - ) nausea, ( - ) heartburn, ( - ) change in bowel habits Skin: ( - ) abnormal skin rashes Lymphatics: ( - ) new lymphadenopathy, ( - ) easy bruising Neurological: ( - ) numbness, ( - ) tingling, ( - ) new weaknesses Behavioral/Psych: ( - ) mood change, ( - ) new changes  All other systems were reviewed with the patient and are negative.  PHYSICAL EXAMINATION: ECOG PERFORMANCE STATUS: 1 - Symptomatic but  completely ambulatory  Vitals:   03/23/24 0810 03/23/24 0815  BP: (!) 144/92 (!) 143/85  Pulse: 97   Resp: 18   Temp: 97.8 F (36.6 C)   SpO2: 97%    Filed Weights   03/23/24 0810  Weight: 164 lb 3.9 oz (74.5 kg)    GENERAL: well appearing male in NAD  SKIN: skin color, texture, turgor are normal, no rashes or significant lesions EYES: conjunctiva are pink and non-injected, sclera clear OROPHARYNX: no exudate, no erythema; lips, buccal mucosa, and tongue normal  NECK: supple, non-tender LYMPH:  no palpable lymphadenopathy in the cervical, axillary or supraclavicular lymph nodes.  LUNGS: clear to auscultation and percussion with normal breathing effort HEART: regular rate & rhythm and no murmurs and no lower extremity edema ABDOMEN: soft, non-tender, non-distended, normal bowel sounds Musculoskeletal: no cyanosis of  digits and no clubbing  PSYCH: alert & oriented x 3, fluent speech NEURO: no focal motor/sensory deficits  LABORATORY DATA:  I have reviewed the data as listed    Latest Ref Rng & Units 03/23/2024    8:50 AM 03/19/2024    9:51 AM 10/27/2021    1:13 PM  CBC  WBC 4.0 - 10.5 K/uL 8.8  6.9  10.6   Hemoglobin 13.0 - 17.0 g/dL 88.0  87.4  87.9   Hematocrit 39.0 - 52.0 % 32.3  Unable to determine due to a cold agglutinin  37.8   Platelets 150 - 400 K/uL 238  220  211        Latest Ref Rng & Units 03/23/2024    8:50 AM 03/19/2024    9:51 AM 04/22/2022    3:31 PM  CMP  Glucose 70 - 99 mg/dL 879  880    BUN 8 - 23 mg/dL 26  22    Creatinine 9.38 - 1.24 mg/dL 8.95  8.88    Sodium 864 - 145 mmol/L 137  139    Potassium 3.5 - 5.1 mmol/L 4.1  3.4    Chloride 98 - 111 mmol/L 103  105    CO2 22 - 32 mmol/L 23  21    Calcium 8.9 - 10.3 mg/dL 9.1  9.1    Total Protein 6.5 - 8.1 g/dL 7.9  8.1  7.8   Total Bilirubin 0.0 - 1.2 mg/dL 0.5  0.7  0.4   Alkaline Phos 38 - 126 U/L 116  119  135   AST 15 - 41 U/L 20  24  27    ALT 0 - 44 U/L 20  24  25       RADIOGRAPHIC  STUDIES: I have personally reviewed the radiological images as listed and agreed with the findings in the report. CT Chest W Contrast Result Date: 03/19/2024 CLINICAL DATA:  Back pain radiating to the right. Abnormal abdominal CT, cancer. * Tracking Code: BO * EXAM: CT CHEST WITH CONTRAST TECHNIQUE: Multidetector CT imaging of the chest was performed during intravenous contrast administration. RADIATION DOSE REDUCTION: This exam was performed according to the departmental dose-optimization program which includes automated exposure control, adjustment of the mA and/or kV according to patient size and/or use of iterative reconstruction technique. CONTRAST:  50mL OMNIPAQUE  IOHEXOL  300 MG/ML  SOLN COMPARISON:  CT abdomen pelvis 03/19/2024. FINDINGS: Cardiovascular: Atherosclerotic calcification of the aorta, aortic valve and coronary arteries. Heart is enlarged. No pericardial effusion. Mediastinum/Nodes: Enlarged lymph nodes are seen throughout the thoracic inlet, mediastinum and left hilum. Index right paratracheal lymph node measures 3.4 cm. No axillary adenopathy. Small prepericardiac and juxtadiaphragmatic lymph nodes. Esophagus is grossly unremarkable. Lungs/Pleura: Basilar predominant hematogenously distributed pulmonary nodules with index a 9 x 13 mm nodule in the posterolateral inferior right lower lobe (3/116). Scattered areas of possible mild septal thickening. No pleural fluid. Airway is unremarkable. Upper Abdomen: Full CT abdomen pelvis dictated earlier the same day. Retrocrural adenopathy. Left renal mass and retroperitoneal adenopathy are partially visualized. Musculoskeletal: Osteopenia. Degenerative changes in the spine. No worrisome lytic or sclerotic lesions. IMPRESSION: 1. Thoracic nodal and pulmonary parenchymal metastatic disease. 2. Partially imaged left renal lesion and abdominal retroperitoneal soft tissue mass, better evaluated on CT abdomen pelvis done the same day. 3. Aortic  atherosclerosis (ICD10-I70.0). Coronary artery calcification. Electronically Signed   By: Newell Eke M.D.   On: 03/19/2024 14:34   CT ABDOMEN PELVIS W CONTRAST Result Date: 03/19/2024 CLINICAL DATA:  Right lower quadrant abdominal and flank pain. * Tracking Code: BO * EXAM: CT ABDOMEN AND PELVIS WITH CONTRAST TECHNIQUE: Multidetector CT imaging of the abdomen and pelvis was performed using the standard protocol following bolus administration of intravenous contrast. RADIATION DOSE REDUCTION: This exam was performed according to the departmental dose-optimization program which includes automated exposure control, adjustment of the mA and/or kV according to patient size and/or use of iterative reconstruction technique. CONTRAST:  OMNIPAQUE  IOHEXOL  300 MG/ML  SOLN COMPARISON:  02/09/2021 FINDINGS: Lower chest: Numerous new the pulmonary nodules are seen in the lung bases measuring up to 6 mm. Moderate hiatal hernia noted. 14 mm short axis left retrocrural lymph node identified on 20/2. Hepatobiliary: 18 mm well-defined homogeneous low-density lesion inferior right liver has decreased from 30 mm previously. 4 mm hypodensity in the posterior right hepatic dome is stable. 4 mm tiny hypodensity anterior hepatic dome on 11/02 is stable. Gallbladder surgically absent. Pneumobilia again noted, compatible with prior sphincterotomy. No intrahepatic or extrahepatic biliary dilation. Pancreas: No focal mass lesion. No dilatation of the main duct. No intraparenchymal cyst. No peripancreatic edema. Spleen: No splenomegaly. No suspicious focal mass lesion. Adrenals/Urinary Tract: No adrenal nodule or mass. Multiple right renal cysts noted. Heterogeneous 4.1 x 3.4 cm mass in the anterior interpolar left kidney appears to have enhancing central soft tissue nodules although this could represent blood clot within a pre-existing lesion. 16 mm potential enhancing nodule in the anterior right renal hilum (37/2). Mild fullness  noted left intrarenal collecting system and left renal pelvis with peripelvic edema. No hydroureter. Bladder is markedly distended. Stomach/Bowel: Moderate hiatal hernia. Stomach otherwise unremarkable. Duodenum is normally positioned as is the ligament of Treitz. Duodenal diverticulum noted. No small bowel wall thickening. No small bowel dilatation. The terminal ileum is normal. The appendix is normal. No gross colonic mass. No colonic wall thickening. Right colonic diverticulosis without diverticulitis. Vascular/Lymphatic: There is moderate atherosclerotic calcification of the abdominal aorta without aneurysm. Irregular centrally necrotic soft tissue mass is identified in the retroperitoneal space just inferior to the SMA and obscuring the left renal vein, potentially a occluding/obliterating the left renal venous anatomy. 4.8 x 2.7 cm left periaortic nodal mass is identified on 36/2. Multiple nodules are seen in the left renal hilum with soft tissue encasing the left renal artery. Nodularity is seen anterior to the left kidney with a 5.2 x 4.9 cm soft tissue mass in the left abdomen on image 45/2. Borderline enlarged lymph nodes are seen at the aortic bifurcation. No pelvic sidewall lymphadenopathy. Reproductive: Prostate gland is enlarged. Other: No intraperitoneal free fluid. Musculoskeletal: 2.8 cm lytic lesion identified in the L2 vertebral body. 9 mm lytic lesion in the left L1 vertebral body. 11 mm lytic lesion noted posterior right ischial tuberosity. IMPRESSION: 1. 4.1 x 3.4 cm heterogeneous mass in the anterior interpolar left kidney appears to have enhancing central soft tissue nodules although this could represent blood clot within a pre-existing lesion. 16 mm potential enhancing nodule in the anterior right renal hilum. Imaging features are highly suspicious for renal cell carcinoma. 2. Irregular centrally necrotic soft tissue mass in the retroperitoneal space just inferior to the SMA and obscuring  the left renal vein, probably occluding/obliterating the left renal venous anatomy. 3. 4.8 x 2.7 cm left periaortic nodal mass with multiple nodules in the left renal hilum and soft tissue encasing the left renal artery. Additional 5.2 x 4.9 cm soft tissue mass in the left abdomen. Borderline enlarged lymph nodes are seen  at the aortic bifurcation. No pelvic sidewall lymphadenopathy. 4. Numerous new pulmonary nodules in the lung bases measuring up to 6 mm. Imaging features are highly suspicious for metastatic disease. 5. 14 mm short axis left retrocrural lymph node. 6. Lytic lesions in the L1 and L2 vertebral bodies and posterior right ischial tuberosity, consistent with metastatic disease. 7. Mild fullness of the left intrarenal collecting system and left renal pelvis with peripelvic edema. No hydroureter. 8. Marked distention of the urinary bladder. 9. Moderate hiatal hernia. 10. Right colonic diverticulosis without diverticulitis. 11.  Aortic Atherosclerosis (ICD10-I70.0). Electronically Signed   By: Camellia Candle M.D.   On: 03/19/2024 12:21    ASSESSMENT & PLAN Dean Randolph is an 83 year old male who is here for evaluation for a left renal lesion, abdominal retroperitoneal soft tissue mass, thoracic nodes and several pulmonary nodules.  Assessment & Plan Renal mass, left Discussed case with Dr. Davonna.  Plan is for PET scan followed by IR biopsy. Return to clinic after biopsy to review PET scan and biopsy results. We will also get repeat lab work today. Acute low back pain, unspecified back pain laterality, unspecified whether sciatica present Patient was noted to have low back pain which is what brought him into the emergency room.  Imaging did show 2 possible lytic lesions on L1 and L2 which were consistent with metastatic disease.  He was given a prescription for Vicodin which will be refilled today.    Orders Placed This Encounter  Procedures   CT BIOPSY    Standing Status:   Future     Expected Date:   03/24/2024    Expiration Date:   03/23/2025    Lab orders requested (DO NOT place separate lab orders, these will be automatically ordered during procedure specimen collection)::   Surgical Pathology    Reason for Exam (SYMPTOM  OR DIAGNOSIS REQUIRED):   possible RCC, please biopsy abdominal soft tissue mass if possible    Release to patient:   Immediate    Preferred location?:   [facility]   NM PET Image Initial (PI) Skull Base To Thigh    Standing Status:   Future    Expected Date:   03/30/2024    Expiration Date:   03/23/2025    If indicated for the ordered procedure, I authorize the administration of a radiopharmaceutical per Radiology protocol:   Yes    Preferred imaging location?:   [name]    Release to patient:   Immediate   CBC with Differential    Standing Status:   Future    Number of Occurrences:   1    Expected Date:   03/23/2024    Expiration Date:   06/21/2024   Comprehensive metabolic panel    Standing Status:   Future    Number of Occurrences:   1    Expected Date:   03/23/2024    Expiration Date:   06/21/2024    All questions were answered. The patient knows to call the clinic with any problems, questions or concerns.  I have spent a total of 40 minutes minutes of face-to-face and non-face-to-face time, preparing to see the patient, obtaining and/or reviewing separately obtained history, performing a medically appropriate examination, counseling and educating the patient, ordering medications/tests/procedures, referring and communicating with other health care professionals, documenting clinical information in the electronic health record, independently interpreting results and communicating results to the patient, and care coordination.   Dean Randolph  Department of Hematology/Oncology [facility]  Cancer Center at [facility] Phone: [phone]

## 2024-03-23 NOTE — Assessment & Plan Note (Addendum)
 Patient was noted to have low back pain which is what brought him into the emergency room.  Imaging did show 2 possible lytic lesions on L1 and L2 which were consistent with metastatic disease.  He was given a prescription for Vicodin which will be refilled today.

## 2024-03-23 NOTE — Patient Instructions (Signed)
 [facility]  Discharge Instructions  Diagnostic Clinic Office Visit Discharge Information and Instructions  Thank you for choosing [facility] for your healthcare needs.  Below is a summary of today's discussion, along with our contact information and an outline of what to expect next.  Reason for Visit:  Renal (Kidney) Mass  Proposed Diagnostic Care Plan: Repeat labs today Biopsy Urology referral  What to Expect: - Generally, when lab tests are ordered the results can take up to 1 week for results to be available.  At that point, we will contact you to discuss your results with you.  Unless there is a critical result, we will typically wait for all of your lab results to be available before contacting you. - If a biopsy is part of your Care Plan, those results can take on average 7-10 days to result.  Once results are available, we will contact you to discuss your pathology results and any next steps. - If you have additional imaging ordered, such as a CT Scan, MRI, Ultrasound, Bone Scan, or PET scan, your imaging will need to be authorized then scheduled with the earliest available appointment.  You may be asked to travel to another hospital within [facility] who has a sooner availability, please consider doing so if asked. - If you use MyChart, your results will be available to you in the MyChart portal.  Your provider will be in touch with you as soon as all of your results are available to be discussed.  Your Diagnostic Clinic Provider:  Randall Hope, NP Your Diagnostic Navigator:  Joesph armin Rattan, contact number [phone]  If you or your caregiver have number blocking on your cell phones, please ensure the cancer center's numbers are not blocked.  If you are not a registered MyChart user, please consider enrolling in MyChart to receive your test results and visit notes.  You can also access your discharge instructions electronically.  MyChart also  gives you an electronic means to communicate with your Care Team instead of needing to call in to the cancer center.  We appreciate you trusting us  with your healthcare and look forward to partnering with you as we work to uncover what your potential diagnosis may be.  Please do not hesitate to reach out at any point with questions or concerns.    Thank you for choosing [name] to provide your oncology and hematology care.   To afford each patient quality time with our provider, please arrive at least 15 minutes before your scheduled appointment time. You may need to reschedule your appointment if you arrive late (10 or more minutes). Arriving late affects you and other patients whose appointments are after yours.  Also, if you miss three or more appointments without notifying the office, you may be dismissed from the clinic at the provider's discretion.    Again, thank you for choosing [facility].  Our hope is that these requests will decrease the amount of time that you wait before being seen by our physicians.   If you have a lab appointment with the Cancer Center - please note that after April 8th, all labs will be drawn in the cancer center.  You do not have to check in or register with the main entrance as you have in the past but will complete your check-in at the cancer center.            _____________________________________________________________  Should you have questions after your visit to [facility], please contact our office at [phone] and follow the prompts.  Our office hours are 8:00 a.m. to 4:30 p.m. Monday - Thursday and 8:00 a.m. to 2:30 p.m. Friday.  Please note that voicemails left after 4:00 p.m. may not be returned until the following business day.  We are closed weekends and all major holidays.  You do have access to a nurse 24-7, just call the main number to the clinic [phone] and do not press any  options, hold on the line and a nurse will answer the phone.    For prescription refill requests, have your pharmacy contact our office and allow 72 hours.    Masks are no longer required in the cancer centers. If you would like for your care team to wear a mask while they are taking care of you, please let them know. You may have one support person who is at least 82 years old accompany you for your appointments.

## 2024-03-24 ENCOUNTER — Other Ambulatory Visit: Payer: Self-pay | Admitting: Student

## 2024-03-24 DIAGNOSIS — N2889 Other specified disorders of kidney and ureter: Secondary | ICD-10-CM

## 2024-03-27 ENCOUNTER — Ambulatory Visit (HOSPITAL_COMMUNITY)
Admission: RE | Admit: 2024-03-27 | Discharge: 2024-03-27 | Disposition: A | Discharge status: Home / Self Care | Source: Ambulatory Visit | Attending: Oncology | Admitting: Oncology

## 2024-03-27 ENCOUNTER — Encounter (HOSPITAL_COMMUNITY): Payer: Self-pay

## 2024-03-27 ENCOUNTER — Other Ambulatory Visit: Payer: Self-pay

## 2024-03-27 DIAGNOSIS — M545 Low back pain, unspecified: Secondary | ICD-10-CM | POA: Diagnosis present

## 2024-03-27 DIAGNOSIS — N2889 Other specified disorders of kidney and ureter: Secondary | ICD-10-CM

## 2024-03-27 DIAGNOSIS — R590 Localized enlarged lymph nodes: Secondary | ICD-10-CM | POA: Insufficient documentation

## 2024-03-27 DIAGNOSIS — I10 Essential (primary) hypertension: Secondary | ICD-10-CM | POA: Diagnosis not present

## 2024-03-27 DIAGNOSIS — C786 Secondary malignant neoplasm of retroperitoneum and peritoneum: Secondary | ICD-10-CM | POA: Insufficient documentation

## 2024-03-27 DIAGNOSIS — R918 Other nonspecific abnormal finding of lung field: Secondary | ICD-10-CM | POA: Insufficient documentation

## 2024-03-27 LAB — PROTIME-INR
INR: 1.1 (ref 0.8–1.2)
Prothrombin Time: 14.3 s (ref 11.4–15.2)

## 2024-03-27 LAB — CBC
HCT: 33.3 % — ABNORMAL LOW (ref 39.0–52.0)
Hemoglobin: 11.8 g/dL — ABNORMAL LOW (ref 13.0–17.0)
MCH: 36.1 pg — ABNORMAL HIGH (ref 26.0–34.0)
MCHC: 35.4 g/dL (ref 30.0–36.0)
MCV: 101.8 fL — ABNORMAL HIGH (ref 80.0–100.0)
Platelets: 224 K/uL (ref 150–400)
RBC: 3.27 MIL/uL — ABNORMAL LOW (ref 4.22–5.81)
RDW: 18 % — ABNORMAL HIGH (ref 11.5–15.5)
WBC: 9.5 K/uL (ref 4.0–10.5)
nRBC: 0 % (ref 0.0–0.2)

## 2024-03-27 MED ORDER — SODIUM CHLORIDE 0.9 % IV SOLN: INTRAVENOUS | Status: DC

## 2024-03-27 MED ORDER — MIDAZOLAM HCL 2 MG/2ML IJ SOLN
INTRAMUSCULAR | Status: AC | PRN
Start: 2024-03-27 — End: 2024-03-27
  Administered 2024-03-27: 1 mg via INTRAVENOUS

## 2024-03-27 MED ORDER — MIDAZOLAM HCL 2 MG/2ML IJ SOLN
INTRAMUSCULAR | Status: AC
  Filled 2024-03-27: qty 2

## 2024-03-27 MED ORDER — FENTANYL CITRATE (PF) 100 MCG/2ML IJ SOLN
INTRAMUSCULAR | Status: AC
  Filled 2024-03-27: qty 2

## 2024-03-27 MED ORDER — FENTANYL CITRATE (PF) 100 MCG/2ML IJ SOLN
INTRAMUSCULAR | Status: AC | PRN
  Administered 2024-03-27: 50 ug via INTRAVENOUS

## 2024-03-27 MED ORDER — LIDOCAINE 1 % OPTIME INJ - NO CHARGE
10.0000 mL | Freq: Once | INTRAMUSCULAR | Status: AC
  Administered 2024-03-27: 10 mL
  Filled 2024-03-27: qty 10

## 2024-03-27 NOTE — H&P (Signed)
 Chief Complaint: Patient was seen in consultation today for retroperitoneal lymphadenopathy  Referring Physician(s): Burns,Jennifer E  Supervising Physician: Philip Cornet  Patient Status: [facility] - [facility]  History of Present Illness: Dean Randolph is a 83 y.o. male with past medical history of hypertension, GERD, pancreatitis due to biliary obstruction presented to the [facility], ER for right sided back, flank and lower abdominal pain for about a week.  CT scan showed 4.1 into 3.4 cm mass in the anterior left kidney with enhancing central soft tissue nodules, lymphadenopathy several pulmonary nodules consistent with metastatic disease. He is referred to IR for CT-guided retroperitoneal lymph node biopsy.   Patient presents to [facility] today for lymph node biopsy approved by Dr. Hughes.   Patient presents today in his usual state of health.  He is aware of the goals of the procedure and is agreeable to proceed.   He has been NPO.  His wife is available for post-procedure care and transportation.   Past Medical History:  Diagnosis Date   Hypertension     Past Surgical History:  Procedure Laterality Date   BILIARY DILATION  03/12/2022   Procedure: BILIARY DILATION;  Surgeon: Wilhelmenia, Aloha Raddle., MD;  Location: [facility];  Service: Gastroenterology;;   BILIARY STENT PLACEMENT N/A 02/06/2021   Procedure: BILIARY STENT PLACEMENT;  Surgeon: Golda Claudis PENNER, MD;  Location: [facility];  Service: Gastroenterology;  Laterality: N/A;   BILIARY STENT PLACEMENT N/A 03/26/2021   Procedure: BILIARY STENT PLACEMENT 10 FRENCH BY 9cm;  Surgeon: Golda Claudis PENNER, MD;  Location: [facility];  Service: Endoscopy;  Laterality: N/A;   BILIARY STENT PLACEMENT N/A 01/19/2022   Procedure: BILIARY STENT PLACEMENT;  Surgeon: Wilhelmenia Aloha Raddle., MD;  Location: [facility];  Service: Gastroenterology;  Laterality: N/A;   BIOPSY  01/19/2022   Procedure: BIOPSY;  Surgeon: Wilhelmenia Aloha Raddle., MD;   Location: [facility];  Service: Gastroenterology;;   CATARACT EXTRACTION Bilateral    CHOLECYSTECTOMY N/A 04/25/2021   Procedure: LAPAROSCOPIC CHOLECYSTECTOMY;  Surgeon: Mavis Anes, MD;  Location: [facility];  Service: General;  Laterality: N/A;   ENDOSCOPIC RETROGRADE CHOLANGIOPANCREATOGRAPHY (ERCP) WITH PROPOFOL  N/A 01/19/2022   Procedure: ENDOSCOPIC RETROGRADE CHOLANGIOPANCREATOGRAPHY (ERCP) WITH PROPOFOL ;  Surgeon: Wilhelmenia Aloha Raddle., MD;  Location: [facility];  Service: Gastroenterology;  Laterality: N/A;   ENDOSCOPIC RETROGRADE CHOLANGIOPANCREATOGRAPHY (ERCP) WITH PROPOFOL  N/A 03/12/2022   Procedure: ENDOSCOPIC RETROGRADE CHOLANGIOPANCREATOGRAPHY (ERCP) WITH PROPOFOL ;  Surgeon: Wilhelmenia Aloha Raddle., MD;  [facility];  Service: Gastroenterology;  Laterality: N/A;   ERCP N/A 02/06/2021   Procedure: ENDOSCOPIC RETROGRADE CHOLANGIOPANCREATOGRAPHY (ERCP);  Surgeon: Golda Claudis PENNER, MD;  Location: [facility];  Service: Gastroenterology;  Laterality: N/A;   ERCP N/A 03/26/2021   Procedure: ENDOSCOPIC RETROGRADE CHOLANGIOPANCREATOGRAPHY (ERCP) with EXTENDED SPHINCTEROTOMY;  Surgeon: Golda Claudis PENNER, MD;  Location: [facility];  Service: Endoscopy;  Laterality: N/A;   ERCP N/A 10/27/2021   Procedure: ENDOSCOPIC RETROGRADE CHOLANGIOPANCREATOGRAPHY (ERCP);  Surgeon: Golda Claudis PENNER, MD;  Location: [facility];  Service: Endoscopy;  Laterality: N/A;  no site    GASTROINTESTINAL STENT REMOVAL N/A 03/26/2021   Procedure: GASTROINTESTINAL STENT REMOVAL;  Surgeon: Golda Claudis PENNER, MD;  [facility]: [facility];  Service: Endoscopy;  Laterality: N/A;   GASTROINTESTINAL STENT REMOVAL N/A 10/27/2021   Procedure: GASTROINTESTINAL STENT EXCHANGE;  Surgeon: Golda Claudis PENNER, MD;  [facility]: [facility];  Service: Endoscopy;  Laterality: N/A;  no site    REMOVAL OF STONES N/A 03/26/2021   Procedure: REMOVAL OF STONES WITH EXTRACTION BASKET;  Surgeon: Golda Claudis PENNER, MD;  [facility]: [facility];  Service: Endoscopy;  Laterality:  N/A;   REMOVAL OF STONES N/A 10/27/2021   Procedure: REMOVAL OF STONES;  Surgeon: Golda Claudis PENNER, MD;  [facility]: [facility];  Service: Endoscopy;  Laterality: N/A;   REMOVAL OF STONES  01/19/2022   Procedure: REMOVAL OF STONES;  Surgeon: Wilhelmenia Aloha Raddle., MD;  Location: [facility];  Service: Gastroenterology;;   REMOVAL OF STONES  03/12/2022   Procedure: REMOVAL OF STONES;  Surgeon: Wilhelmenia Aloha Raddle., MD;  Location: [facility];  Service: Gastroenterology;;   ANNETT N/A 02/06/2021   Procedure: ANNETT;  Surgeon: Golda Claudis PENNER, MD;  [facility];  Service: Gastroenterology;  Laterality: N/A;   SPHINCTEROTOMY N/A 10/27/2021   Procedure: SPHINCTEROTOMY;  Surgeon: Golda Claudis PENNER, MD;  [facility]: [facility];  Service: Endoscopy;  Laterality: N/A;   SPYGLASS CHOLANGIOSCOPY N/A 01/19/2022   Procedure: SPYGLASS CHOLANGIOSCOPY;  Surgeon: Wilhelmenia Aloha Raddle., MD;  [facility];  Service: Gastroenterology;  Laterality: N/A;   SPYGLASS CHOLANGIOSCOPY N/A 03/12/2022   Procedure: DEBHOJDD CHOLANGIOSCOPY;  Surgeon: Wilhelmenia Aloha Raddle., MD;  [facility]: [facility];  Service: Gastroenterology;  Laterality: N/A;   SPYGLASS LITHOTRIPSY N/A 03/12/2022   Procedure: DEBHOJDD LITHOTRIPSY;  Surgeon: Wilhelmenia Aloha Raddle., MD;  Location: [facility];  Service: Gastroenterology;  Laterality: N/A;   STENT REMOVAL  01/19/2022   Procedure: STENT REMOVAL;  Surgeon: Wilhelmenia Aloha Raddle., MD;  Location: [facility];  Service: Gastroenterology;;   CLEDA REMOVAL  03/12/2022   Procedure: STENT REMOVAL;  Surgeon: Wilhelmenia Aloha Raddle., MD;  Location: [facility];  Service: Gastroenterology;;   STONE EXTRACTION WITH BASKET  01/19/2022   Procedure: STONE EXTRACTION WITH BASKET;  Surgeon: Wilhelmenia Aloha Raddle., MD;  Location: [facility];  Service: Gastroenterology;;    Allergies: Patient has no known allergies.  Medications: Prior to Admission medications   Medication Sig  Start Date End Date Taking? Authorizing Provider  HYDROcodone -acetaminophen  (NORCO/VICODIN) 5-325 MG tablet Take 1 tablet by mouth every 6 (six) hours as needed for severe pain (pain score 7-10). 03/23/24   Geofm Delon BRAVO, NP     Family History  Problem Relation Age of Onset   Stroke Mother    Colon cancer Neg Hx    Esophageal cancer Neg Hx    Inflammatory bowel disease Neg Hx    Liver disease Neg Hx    Pancreatic cancer Neg Hx    Rectal cancer Neg Hx    Stomach cancer Neg Hx     Social History   Socioeconomic History   Marital status: Married    Spouse name: Not on file   Number of children: Not on file   Years of education: Not on file   Highest education level: Not on file  Occupational History   Occupation: electrician    Comment: retired  Tobacco Use   Smoking status: Never    Passive exposure: Past   Smokeless tobacco: Never  Vaping Use   Vaping status: Never Used  Substance and Sexual Activity   Alcohol use: Never   Drug use: Never   Sexual activity: Yes  Other Topics Concern   Not on file  Social History Narrative   Not on file   Social Drivers of Health   Financial Resource Strain: Not on file  Food Insecurity: No Food Insecurity (03/23/2024)   Hunger Vital Sign    Worried About Running Out of Food in the Last Year: Never true    Ran Out of Food  in the Last Year: Never true  Transportation Needs: No Transportation Needs (03/23/2024)   PRAPARE - Administrator, Civil Service (Medical): No    Lack of Transportation (Non-Medical): No  Physical Activity: Not on file  Stress: Not on file  Social Connections: Not on file     Review of Systems: A 12 point ROS discussed and pertinent positives are indicated in the HPI above.  All other systems are negative.  Review of Systems  Constitutional:  Negative for fatigue and fever.  Respiratory:  Negative for cough and shortness of breath.   Cardiovascular:  Negative for chest pain.   Gastrointestinal:  Negative for abdominal pain, nausea and vomiting.  Musculoskeletal:  Negative for back pain.  Psychiatric/Behavioral:  Negative for behavioral problems and confusion.     Vital Signs: BP 134/88   Pulse 85   Temp 97.6 F (36.4 C) (Oral)   Resp 17   Ht 5' 8 (1.727 m)   Wt 166 lb (75.3 kg)   SpO2 96%   BMI 25.24 kg/m   Physical Exam Vitals and nursing note reviewed.  Constitutional:      General: He is not in acute distress.    Appearance: Normal appearance. He is not ill-appearing.  HENT:     Mouth/Throat:     Mouth: Mucous membranes are moist.     Pharynx: Oropharynx is clear.  Cardiovascular:     Rate and Rhythm: Normal rate and regular rhythm.  Pulmonary:     Effort: Pulmonary effort is normal. No respiratory distress.  Skin:    General: Skin is warm and dry.  Neurological:     General: No focal deficit present.     Mental Status: He is alert and oriented to person, place, and time. Mental status is at baseline.  Psychiatric:        Mood and Affect: Mood normal.        Behavior: Behavior normal.        Thought Content: Thought content normal.        Judgment: Judgment normal.      MD Evaluation Airway: WNL Heart: WNL Abdomen: WNL Chest/ Lungs: WNL ASA  Classification: 3 Mallampati/Airway Score: Two   Imaging: CT Chest W Contrast Result Date: 03/19/2024 CLINICAL DATA:  Back pain radiating to the right. Abnormal abdominal CT, cancer. * Tracking Code: BO * EXAM: CT CHEST WITH CONTRAST TECHNIQUE: Multidetector CT imaging of the chest was performed during intravenous contrast administration. RADIATION DOSE REDUCTION: This exam was performed according to the departmental dose-optimization program which includes automated exposure control, adjustment of the mA and/or kV according to patient size and/or use of iterative reconstruction technique. CONTRAST:  50mL OMNIPAQUE  IOHEXOL  300 MG/ML  SOLN COMPARISON:  CT abdomen pelvis 03/19/2024. FINDINGS:  Cardiovascular: Atherosclerotic calcification of the aorta, aortic valve and coronary arteries. Heart is enlarged. No pericardial effusion. Mediastinum/Nodes: Enlarged lymph nodes are seen throughout the thoracic inlet, mediastinum and left hilum. Index right paratracheal lymph node measures 3.4 cm. No axillary adenopathy. Small prepericardiac and juxtadiaphragmatic lymph nodes. Esophagus is grossly unremarkable. Lungs/Pleura: Basilar predominant hematogenously distributed pulmonary nodules with index a 9 x 13 mm nodule in the posterolateral inferior right lower lobe (3/116). Scattered areas of possible mild septal thickening. No pleural fluid. Airway is unremarkable. Upper Abdomen: Full CT abdomen pelvis dictated earlier the same day. Retrocrural adenopathy. Left renal mass and retroperitoneal adenopathy are partially visualized. Musculoskeletal: Osteopenia. Degenerative changes in the spine. No worrisome lytic or sclerotic lesions. IMPRESSION:  1. Thoracic nodal and pulmonary parenchymal metastatic disease. 2. Partially imaged left renal lesion and abdominal retroperitoneal soft tissue mass, better evaluated on CT abdomen pelvis done the same day. 3. Aortic atherosclerosis (ICD10-I70.0). Coronary artery calcification. Electronically Signed   By: Newell Eke M.D.   On: 03/19/2024 14:34   CT ABDOMEN PELVIS W CONTRAST Result Date: 03/19/2024 CLINICAL DATA:  Right lower quadrant abdominal and flank pain. * Tracking Code: BO * EXAM: CT ABDOMEN AND PELVIS WITH CONTRAST TECHNIQUE: Multidetector CT imaging of the abdomen and pelvis was performed using the standard protocol following bolus administration of intravenous contrast. RADIATION DOSE REDUCTION: This exam was performed according to the departmental dose-optimization program which includes automated exposure control, adjustment of the mA and/or kV according to patient size and/or use of iterative reconstruction technique. CONTRAST:  OMNIPAQUE  IOHEXOL   300 MG/ML  SOLN COMPARISON:  02/09/2021 FINDINGS: Lower chest: Numerous new the pulmonary nodules are seen in the lung bases measuring up to 6 mm. Moderate hiatal hernia noted. 14 mm short axis left retrocrural lymph node identified on 20/2. Hepatobiliary: 18 mm well-defined homogeneous low-density lesion inferior right liver has decreased from 30 mm previously. 4 mm hypodensity in the posterior right hepatic dome is stable. 4 mm tiny hypodensity anterior hepatic dome on 11/02 is stable. Gallbladder surgically absent. Pneumobilia again noted, compatible with prior sphincterotomy. No intrahepatic or extrahepatic biliary dilation. Pancreas: No focal mass lesion. No dilatation of the main duct. No intraparenchymal cyst. No peripancreatic edema. Spleen: No splenomegaly. No suspicious focal mass lesion. Adrenals/Urinary Tract: No adrenal nodule or mass. Multiple right renal cysts noted. Heterogeneous 4.1 x 3.4 cm mass in the anterior interpolar left kidney appears to have enhancing central soft tissue nodules although this could represent blood clot within a pre-existing lesion. 16 mm potential enhancing nodule in the anterior right renal hilum (37/2). Mild fullness noted left intrarenal collecting system and left renal pelvis with peripelvic edema. No hydroureter. Bladder is markedly distended. Stomach/Bowel: Moderate hiatal hernia. Stomach otherwise unremarkable. Duodenum is normally positioned as is the ligament of Treitz. Duodenal diverticulum noted. No small bowel wall thickening. No small bowel dilatation. The terminal ileum is normal. The appendix is normal. No gross colonic mass. No colonic wall thickening. Right colonic diverticulosis without diverticulitis. Vascular/Lymphatic: There is moderate atherosclerotic calcification of the abdominal aorta without aneurysm. Irregular centrally necrotic soft tissue mass is identified in the retroperitoneal space just inferior to the SMA and obscuring the left renal vein,  potentially a occluding/obliterating the left renal venous anatomy. 4.8 x 2.7 cm left periaortic nodal mass is identified on 36/2. Multiple nodules are seen in the left renal hilum with soft tissue encasing the left renal artery. Nodularity is seen anterior to the left kidney with a 5.2 x 4.9 cm soft tissue mass in the left abdomen on image 45/2. Borderline enlarged lymph nodes are seen at the aortic bifurcation. No pelvic sidewall lymphadenopathy. Reproductive: Prostate gland is enlarged. Other: No intraperitoneal free fluid. Musculoskeletal: 2.8 cm lytic lesion identified in the L2 vertebral body. 9 mm lytic lesion in the left L1 vertebral body. 11 mm lytic lesion noted posterior right ischial tuberosity. IMPRESSION: 1. 4.1 x 3.4 cm heterogeneous mass in the anterior interpolar left kidney appears to have enhancing central soft tissue nodules although this could represent blood clot within a pre-existing lesion. 16 mm potential enhancing nodule in the anterior right renal hilum. Imaging features are highly suspicious for renal cell carcinoma. 2. Irregular centrally necrotic soft tissue mass in  the retroperitoneal space just inferior to the SMA and obscuring the left renal vein, probably occluding/obliterating the left renal venous anatomy. 3. 4.8 x 2.7 cm left periaortic nodal mass with multiple nodules in the left renal hilum and soft tissue encasing the left renal artery. Additional 5.2 x 4.9 cm soft tissue mass in the left abdomen. Borderline enlarged lymph nodes are seen at the aortic bifurcation. No pelvic sidewall lymphadenopathy. 4. Numerous new pulmonary nodules in the lung bases measuring up to 6 mm. Imaging features are highly suspicious for metastatic disease. 5. 14 mm short axis left retrocrural lymph node. 6. Lytic lesions in the L1 and L2 vertebral bodies and posterior right ischial tuberosity, consistent with metastatic disease. 7. Mild fullness of the left intrarenal collecting system and left  renal pelvis with peripelvic edema. No hydroureter. 8. Marked distention of the urinary bladder. 9. Moderate hiatal hernia. 10. Right colonic diverticulosis without diverticulitis. 11.  Aortic Atherosclerosis (ICD10-I70.0). Electronically Signed   By: Camellia Candle M.D.   On: 03/19/2024 12:21    Labs:  CBC: Recent Labs    03/19/24 0951 03/23/24 0850  WBC 6.9 8.8  HGB 12.5* 11.9*  HCT Unable to determine due to a cold agglutinin 32.3*  PLT 220 238    COAGS: No results for input(s): INR, APTT in the last 8760 hours.  BMP: Recent Labs    03/19/24 0951 03/23/24 0850  NA 139 137  K 3.4* 4.1  CL 105 103  CO2 21* 23  GLUCOSE 119* 120*  BUN 22 26*  CALCIUM 9.1 9.1  CREATININE 1.11 1.04  GFRNONAA >60 >60    LIVER FUNCTION TESTS: Recent Labs    03/19/24 0951 03/23/24 0850  BILITOT 0.7 0.5  AST 24 20  ALT 24 20  ALKPHOS 119 116  PROT 8.1 7.9  ALBUMIN 3.6 3.6    TUMOR MARKERS: No results for input(s): AFPTM, CEA, CA199, CHROMGRNA in the last 8760 hours.  Assessment and Plan: Patient with past medical history of HTN, gall stone pancreatitis s/p ERCP with stent who presents with complaint of flank pain. He was recently found to have a renal mass with associated lymphadenopathy and pumonary nodules concerning for metastatic RCC.  IR consulted for lymph node biopsy at the request of Delon Hope, NP. Case reviewed by Dr. Hughes who approves patient for procedure.  Patient presents today in their usual state of health.  He has been NPO and is not currently on blood thinners.   Risks and benefits of biopsy was discussed with the patient and/or patient's family including, but not limited to bleeding, infection, damage to adjacent structures or low yield requiring additional tests.  All of the questions were answered and there is agreement to proceed.  Consent signed and in chart.   Thank you for this interesting consult.  I greatly enjoyed meeting Dean Randolph and look forward to participating in their care.  A copy of this report was sent to the requesting provider on this date.  Electronically Signed: Demani Weyrauch Sue-Ellen Ginny Loomer, PA 03/27/2024, 9:50 AM   I spent a total of  30 Minutes   in face to face in clinical consultation, greater than 50% of which was counseling/coordinating care for retroperitoneal lymphadenopathy.

## 2024-03-27 NOTE — Procedures (Signed)
 Interventional Radiology Procedure:   Indications: Left renal lesion with retroperitoneal disease / lymphadenopathy  Procedure: CT guided left retroperitoneal lesion biopsy  Findings: Left retroperitoneal paraspinal soft tissue was biopsied.  Core biopsies placed in saline.   Complications: None     EBL: Minimal  Plan: Discharge to home in 2 hours  Danali Marinos R. Philip, MD  Pager: [phone]

## 2024-03-30 ENCOUNTER — Encounter (HOSPITAL_COMMUNITY)
Admission: RE | Admit: 2024-03-30 | Discharge: 2024-03-30 | Disposition: A | Discharge status: Home / Self Care | Source: Ambulatory Visit | Attending: Oncology | Admitting: Oncology

## 2024-03-30 DIAGNOSIS — M545 Low back pain, unspecified: Secondary | ICD-10-CM | POA: Insufficient documentation

## 2024-03-30 DIAGNOSIS — N2889 Other specified disorders of kidney and ureter: Secondary | ICD-10-CM | POA: Insufficient documentation

## 2024-03-30 MED ORDER — FLUDEOXYGLUCOSE F - 18 (FDG) INJECTION
8.3300 | Freq: Once | INTRAVENOUS | Status: AC | PRN
  Administered 2024-03-30: 8.33 via INTRAVENOUS

## 2024-03-31 ENCOUNTER — Ambulatory Visit: Payer: Self-pay | Admitting: Oncology

## 2024-03-31 ENCOUNTER — Ambulatory Visit: Admitting: Nurse Practitioner

## 2024-03-31 LAB — SURGICAL PATHOLOGY

## 2024-03-31 NOTE — Progress Notes (Signed)
 CLAUDIUS Delon Hope, NP 03/31/2024 3:05 PM

## 2024-03-31 NOTE — Progress Notes (Signed)
 CLAUDIUS Delon Hope, NP 03/31/2024 2:11 PM

## 2024-04-04 ENCOUNTER — Inpatient Hospital Stay: Attending: Oncology | Admitting: Oncology

## 2024-04-04 VITALS — BP 132/88 | HR 86 | Temp 96.7°F | Resp 20 | Ht 66.93 in | Wt 160.3 lb

## 2024-04-04 DIAGNOSIS — C679 Malignant neoplasm of bladder, unspecified: Secondary | ICD-10-CM | POA: Diagnosis not present

## 2024-04-04 DIAGNOSIS — Z79620 Long term (current) use of immunosuppressive biologic: Secondary | ICD-10-CM | POA: Diagnosis not present

## 2024-04-04 DIAGNOSIS — Z5111 Encounter for antineoplastic chemotherapy: Secondary | ICD-10-CM | POA: Diagnosis present

## 2024-04-04 DIAGNOSIS — R350 Frequency of micturition: Secondary | ICD-10-CM | POA: Insufficient documentation

## 2024-04-04 DIAGNOSIS — D649 Anemia, unspecified: Secondary | ICD-10-CM | POA: Diagnosis not present

## 2024-04-04 DIAGNOSIS — C787 Secondary malignant neoplasm of liver and intrahepatic bile duct: Secondary | ICD-10-CM | POA: Diagnosis not present

## 2024-04-04 DIAGNOSIS — C7801 Secondary malignant neoplasm of right lung: Secondary | ICD-10-CM | POA: Insufficient documentation

## 2024-04-04 DIAGNOSIS — C786 Secondary malignant neoplasm of retroperitoneum and peritoneum: Secondary | ICD-10-CM | POA: Diagnosis not present

## 2024-04-04 DIAGNOSIS — Z5112 Encounter for antineoplastic immunotherapy: Secondary | ICD-10-CM | POA: Insufficient documentation

## 2024-04-04 DIAGNOSIS — C7951 Secondary malignant neoplasm of bone: Secondary | ICD-10-CM | POA: Diagnosis not present

## 2024-04-04 DIAGNOSIS — C689 Malignant neoplasm of urinary organ, unspecified: Secondary | ICD-10-CM | POA: Insufficient documentation

## 2024-04-04 DIAGNOSIS — C7802 Secondary malignant neoplasm of left lung: Secondary | ICD-10-CM | POA: Insufficient documentation

## 2024-04-04 NOTE — Progress Notes (Signed)
 Hematology-Oncology Clinic Note  Pcp, No   Reason for Referral: Metastatic urothelial carcinoma  Oncology History: I have reviewed his chart and materials related to his cancer extensively and collaborated history with the patient. Summary of oncologic history is as follows:  Diagnosis: Stage IV urothelial carcinoma of upper urinary tract or collecting duct  -Presentation: Patient seen in the ED on 03/19/2024 for right sided back, right flank, and right lower abdominal pain.  -03/19/2024: CT [facility]:  4.1 x 3.4 cm heterogeneous mass in the anterior interpolar left kidney appears to have enhancing central soft tissue nodules although this could represent blood clot within a pre-existing lesion. 16 mm potential enhancing nodule in the anterior right renal hilum. Imaging features are highly suspicious for renal cell carcinoma. Irregular centrally necrotic soft tissue mass in the retroperitoneal space just inferior to the SMA and obscuring the left renal vein, probably occluding/obliterating the left renal venous anatomy. 4.8 x 2.7 cm left periaortic nodal mass with multiple nodules in the left renal hilum and soft tissue encasing the left renal artery. Additional 5.2 x 4.9 cm soft tissue mass in the left abdomen. Borderline enlarged lymph nodes are seen at the aortic bifurcation. No pelvic sidewall lymphadenopathy. Numerous new pulmonary nodules in the lung bases measuring up to 6 mm. Imaging features are highly suspicious for metastatic disease. 14 mm short axis left retrocrural lymph node. Lytic lesions in the L1 and L2 vertebral bodies and posterior right ischial tuberosity, consistent with metastatic disease. -03/19/2024: CT chest: Thoracic nodal and pulmonary parenchymal metastatic disease.  -03/27/2024: Left retroperitoneal mass biopsy.  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  -Immunohistochemistry stains show the neoplasm is positive for cytokeratin AE13, ck8/18, high molecular weight cytokeratin (focal), gata 3, pax8, vimentin. Negative staining for ca-ix, ck7, ck20, cdx2, cd117, nkx3.1. the staining pattern favor metastatic urothelial carcinoma.  -03/30/2024: Initial PET: Left renal hypermetabolic masses consistent with malignancy with associated multiple hypermetabolic retroperitoneal, mediastinal and supraclavicular metastatic lymphadenopathy , osseous, pulmonary and peritoneal metastasis.   History of Presenting Illness:  Discussed the use of AI scribe software for clinical note transcription with the patient, who gave verbal consent to proceed.  History of Present Illness Dean Randolph 83 y.o. male is referred by ER for metastatic urothelial carcinoma.  He was accompanied by his wife and daughter today.  Patient has a medical history of hypertension, choledocholithiasis s/p ERCP with stent placement, and pancreatitis due to biliary obstruction. He has a surgical history of cholecystectomy.   We discussed the pathology results in detail.  He has bladder cancer/upper urinary tract cancer that has metastasized to the lymph nodes near the lungs, liver, vertebral bones, and several other lymph nodes. [name]  He experiences back pain, which he manages with Percocet taken twice daily. This regimen is effective for him, and he has not pursued radiation therapy for pain management.  He experiences frequent urination, approximately five to seven times a night, with small amounts each time. He does not mind getting up at night but notes that the bladder is not holding urine well.  He feels off balance and weak, which he attributes to his overall condition. No numbness or tingling in his feet is reported, and he remains active around the house.    Medical History: Past Medical History:  Diagnosis Date   Hypertension      Surgical history: Past Surgical History:  Procedure Laterality Date   BILIARY DILATION  03/12/2022   Procedure: BILIARY DILATION;  Surgeon: Mansouraty, Aloha Raddle., MD;  Location: [facility];  Service: Gastroenterology;;   BILIARY STENT PLACEMENT N/A 02/06/2021   Procedure: BILIARY STENT PLACEMENT;  Surgeon: Golda Claudis PENNER, MD;  Location: [facility];  Service: Gastroenterology;  Laterality: N/A;   BILIARY STENT PLACEMENT N/A 03/26/2021   Procedure: BILIARY STENT PLACEMENT 10 FRENCH BY 9cm;  Surgeon: Golda Claudis PENNER, MD;  Location: [facility];  Service: Endoscopy;  Laterality: N/A;   BILIARY STENT PLACEMENT N/A 01/19/2022   Procedure: BILIARY STENT PLACEMENT;  Surgeon: Wilhelmenia Aloha Raddle., MD;  Location: [facility];  Service: Gastroenterology;  Laterality: N/A;   BIOPSY  01/19/2022   Procedure: BIOPSY;  Surgeon: Wilhelmenia Aloha Raddle., MD;  Location: [facility];  Service: Gastroenterology;;   CATARACT EXTRACTION Bilateral    CHOLECYSTECTOMY N/A 04/25/2021   Procedure: LAPAROSCOPIC CHOLECYSTECTOMY;  Surgeon: Mavis Anes, MD;  Location: [facility];  Service: General;  Laterality: N/A;   ENDOSCOPIC RETROGRADE CHOLANGIOPANCREATOGRAPHY (ERCP) WITH PROPOFOL  N/A 01/19/2022   Procedure: ENDOSCOPIC RETROGRADE CHOLANGIOPANCREATOGRAPHY (ERCP) WITH PROPOFOL ;  Surgeon: Wilhelmenia Aloha Raddle., MD;  Location: [facility];  Service: Gastroenterology;  Laterality: N/A;   ENDOSCOPIC RETROGRADE CHOLANGIOPANCREATOGRAPHY (ERCP) WITH PROPOFOL  N/A 03/12/2022   Procedure: ENDOSCOPIC RETROGRADE CHOLANGIOPANCREATOGRAPHY (ERCP) WITH PROPOFOL ;  Surgeon: Wilhelmenia Aloha Raddle., MD;  [facility]: [facility];  Service: Gastroenterology;  Laterality: N/A;   ERCP N/A 02/06/2021   Procedure: ENDOSCOPIC RETROGRADE CHOLANGIOPANCREATOGRAPHY (ERCP);  Surgeon: Golda Claudis PENNER, MD;  [facility];  Service: Gastroenterology;  Laterality: N/A;   ERCP N/A 03/26/2021   Procedure: ENDOSCOPIC RETROGRADE CHOLANGIOPANCREATOGRAPHY  (ERCP) with EXTENDED SPHINCTEROTOMY;  Surgeon: Golda Claudis PENNER, MD;  [facility]: [facility];  Service: Endoscopy;  Laterality: N/A;   ERCP N/A 10/27/2021   Procedure: ENDOSCOPIC RETROGRADE CHOLANGIOPANCREATOGRAPHY (ERCP);  Surgeon: Golda Claudis PENNER, MD;  [facility]: [facility];  Service: Endoscopy;  Laterality: N/A;  no site    GASTROINTESTINAL STENT REMOVAL N/A 03/26/2021   Procedure: GASTROINTESTINAL STENT REMOVAL;  Surgeon: Golda Claudis PENNER, MD;  [facility]: [facility];  Service: Endoscopy;  Laterality: N/A;   GASTROINTESTINAL STENT REMOVAL N/A 10/27/2021   Procedure: GASTROINTESTINAL STENT EXCHANGE;  Surgeon: Golda Claudis PENNER, MD;  [facility];  Service: Endoscopy;  Laterality: N/A;  no site    REMOVAL OF STONES N/A 03/26/2021   Procedure: REMOVAL OF STONES WITH EXTRACTION BASKET;  Surgeon: Golda Claudis PENNER, MD;  [facility];  Service: Endoscopy;  Laterality: N/A;   REMOVAL OF STONES N/A 10/27/2021   Procedure: REMOVAL OF STONES;  Surgeon: Golda Claudis PENNER, MD;  [facility];  Service: Endoscopy;  Laterality: N/A;   REMOVAL OF STONES  01/19/2022   Procedure: REMOVAL OF STONES;  Surgeon: Wilhelmenia Aloha Raddle., MD;  Location: [facility];  Service: Gastroenterology;;   REMOVAL OF STONES  03/12/2022   Procedure: REMOVAL OF STONES;  Surgeon: Wilhelmenia Aloha Raddle., MD;  Location: [facility];  Service: Gastroenterology;;   ANNETT N/A 02/06/2021   Procedure: ANNETT;  Surgeon: Golda Claudis PENNER, MD;  [facility];  Service: Gastroenterology;  Laterality: N/A;   SPHINCTEROTOMY N/A 10/27/2021   Procedure: SPHINCTEROTOMY;  Surgeon: Golda Claudis PENNER, MD;  [facility]: [facility];  Service: Endoscopy;  Laterality: N/A;   SPYGLASS CHOLANGIOSCOPY N/A 01/19/2022   Procedure: SPYGLASS CHOLANGIOSCOPY;  Surgeon: Wilhelmenia Aloha Raddle., MD;  Location: [facility];  Service: Gastroenterology;  Laterality: N/A;   SPYGLASS CHOLANGIOSCOPY N/A 03/12/2022   Procedure: SPYGLASS CHOLANGIOSCOPY;  Surgeon:  Wilhelmenia,  Aloha Raddle., MD;  Location: [facility];  Service: Gastroenterology;  Laterality: N/A;   SPYGLASS LITHOTRIPSY N/A 03/12/2022   Procedure: DEBHOJDD LITHOTRIPSY;  Surgeon: Wilhelmenia Aloha Raddle., MD;  Location: [facility];  Service: Gastroenterology;  Laterality: N/A;   STENT REMOVAL  01/19/2022   Procedure: STENT REMOVAL;  Surgeon: Wilhelmenia Aloha Raddle., MD;  Location: [facility];  Service: Gastroenterology;;   CLEDA REMOVAL  03/12/2022   Procedure: STENT REMOVAL;  Surgeon: Wilhelmenia Aloha Raddle., MD;  Location: [facility];  Service: Gastroenterology;;   STONE EXTRACTION WITH BASKET  01/19/2022   Procedure: STONE EXTRACTION WITH BASKET;  Surgeon: Wilhelmenia Aloha Raddle., MD;  Location: [facility];  Service: Gastroenterology;;     Allergies:  has no known allergies.  Medications:  Current Outpatient Medications  Medication Sig Dispense Refill   HYDROcodone -acetaminophen  (NORCO/VICODIN) 5-325 MG tablet Take 1 tablet by mouth every 6 (six) hours as needed for severe pain (pain score 7-10). 30 tablet 0   No current facility-administered medications for this visit.    Review of Systems: Constitutional: Denies fevers, chills or abnormal night sweats Eyes: Denies blurriness of vision, double vision or watery eyes Ears, nose, mouth, throat, and face: Denies mucositis or sore throat Respiratory: Denies cough, dyspnea or wheezes Cardiovascular: Denies palpitation, chest discomfort or lower extremity swelling Gastrointestinal:  Denies nausea, heartburn or change in bowel habits Skin: Denies abnormal skin rashes Lymphatics: Denies new lymphadenopathy or easy bruising Neurological:Denies numbness, tingling or new weaknesses Behavioral/Psych: Mood is stable, no new changes  All other systems were reviewed with the patient and are negative.  Physical Examination: ECOG PERFORMANCE STATUS: 1 - Symptomatic but completely ambulatory  Vitals:   04/04/24 1056  BP: 132/88   Pulse: 86  Resp: 20  Temp: (!) 96.7 F (35.9 C)  SpO2: 98%   Filed Weights   04/04/24 1056  Weight: 160 lb 4.4 oz (72.7 kg)    GENERAL:alert, no distress and comfortable SKIN: skin color, texture, turgor are normal, no rashes or significant lesions LYMPH:  no palpable lymphadenopathy in the cervical, axillary or inguinal LUNGS: clear to auscultation and percussion with normal breathing effort HEART: regular rate & rhythm and no murmurs and no lower extremity edema ABDOMEN:abdomen soft, non-tender and normal bowel sounds Musculoskeletal:no cyanosis of digits and no clubbing  PSYCH: alert & oriented x 3 with fluent speech NEURO: no focal motor/sensory deficits   Laboratory Data: I have reviewed the data as listed Lab Results  Component Value Date   WBC 9.5 03/27/2024   HGB 11.8 (L) 03/27/2024   HCT 33.3 (L) 03/27/2024   MCV 101.8 (H) 03/27/2024   PLT 224 03/27/2024   Recent Labs    03/19/24 0951 03/23/24 0850  NA 139 137  K 3.4* 4.1  CL 105 103  CO2 21* 23  GLUCOSE 119* 120*  BUN 22 26*  CREATININE 1.11 1.04  CALCIUM 9.1 9.1  GFRNONAA >60 >60  PROT 8.1 7.9  ALBUMIN 3.6 3.6  AST 24 20  ALT 24 20  ALKPHOS 119 116  BILITOT 0.7 0.5    Radiographic Studies: I have personally reviewed the radiological images as listed and agreed with the findings in the report.  NM PET Image Initial (PI) Skull Base To Thigh CLINICAL DATA:  Initial treatment strategy for renal mass.  EXAM: NUCLEAR MEDICINE PET SKULL BASE TO THIGH  TECHNIQUE: 8.33 mCi F-18 FDG was injected intravenously. Full-ring PET imaging was performed from the skull base to thigh after the radiotracer. CT data was obtained  and used for attenuation correction and anatomic localization.  Fasting blood glucose: 125 mg/dl  COMPARISON:  CT chest abdomen pelvis March 19, 2024  FINDINGS: Mediastinal blood pool activity: SUV max 2.9  Liver activity: SUV max 3.3  NECK: Left supraclavicular  hypermetabolic lymph nodes consistent with metastasis max SUV up to 7.4  Incidental CT findings: None.  CHEST: Hypermetabolic lymphadenopathy involving the right upper and lower paratracheal, prevascular, subcarinal, bilateral hilar nodal stations consistent with metastasis. Index nodal conglomerate in right paratracheal measures 4 x 4.7 cm with max SUV 8.9  Numerous pulmonary nodules consistent with metastasis, majority are below PET resolution. Right upper lobe ground-glass attenuation and bronchocentric nodules are more confluence with max SUV of 4.7. No pleural effusion.  ABDOMEN/PELVIS: Left anterior kidney FDG avid mass measuring 2.4 cm with max SUV 16.3.  Additionally there is a left anterior kidney exophytic cystic structure measuring 4.1x 3.1 cm with a central hypermetabolic nidus measuring 1 cm with max SUV 6.4. Evaluation of hypermetabolic masses on PET-CT is limited given the intense tracer activity within the collecting system obscuring underlying details. Please refer to recent CT abdomen pelvis with contrast or dedicated MRI imaging.  Few additional simple and hyperdense non FDG avid renal cortical cysts in both kidneys.  Some perinephric metastatic nodules extending to anterior the perirenal fascia. Multiple peritoneal implants index necrotic mass inferior to the left kidney measures 5.6 x 5.2 cm with max SUV 17.9.  Conglomerate of retroperitoneal para-aortic and mesenteric root metastatic lymphadenopathy, similar to recent CT max SUV up to 31. Index nodal conglomerate anterior to the aorta at the level of renal hilum measures 4.7 x 4.9 cm with max SUV 31.  Multiple aortocaval, para-aortic, bilateral common iliac and left external iliac additional metastatic lymphadenopathy.  Multiple bilateral retrocrural an enlarged left paraspinal lymphadenopathy.  There is thickening of left adrenal gland with increased FDG uptake concern for metastatic disease max SUV  4.8.  Cholecystectomy. Left hepatic lobe pneumobilia. Hypodense right hepatic lobe lesion without significant FDG uptake above background liver likely a cyst. Moderate to large size hiatal hernia. Atrophic changes of the pancreas.  SKELETON: Osseous metastasis involving the right ischium left acetabulum, left pubic bone, left iliac bone adjacent to SI joint, multilevel vertebral bodies. Index lytic lesions within L2 vertebral bodies with max SUV up to 12.5.  Some of these lesions does not demonstrate any CT correlate.  IMPRESSION: Left renal hypermetabolic masses consistent with malignancy with associated multiple hypermetabolic retroperitoneal, mediastinal and supraclavicular metastatic lymphadenopathy , osseous, pulmonary and peritoneal metastasis. Findings are stable to recent CT abdomen pelvis from March 19, 2024.  Electronically Signed   By: Megan  Zare M.D.   On: 03/31/2024 14:53    ASSESSMENT & PLAN:  Patient is a 83 y.o. male presenting for malignant urothelial carcinoma Assessment and Plan Assessment & Plan Stage 4 bladder cancer with metastases to bone, lymph nodes Stage IV urothelial carcinoma of upper urinary tract or collecting duct Oncology history above Path IHC staining consistent with urothelial carcinoma   -We discussed the PET scan results together.  Patient has extensive Mehta stasis involving lymph nodes, bone disease. -We discussed the pathology results in detail and poor prognosis with his stage IV carcinoma.  Goal is to treat with a palliative intent. -Considering the pathology, extent of the disease, patient's comorbidities, I think enfortumab vedotin with pembrolizumab is the best available first-line option for the patient. -The patient was informed of the known and potential side effects of enfortumab vedotin (including  fatigue, rash, peripheral neuropathy, high blood sugar, low white blood cell count, eye problems, and rare but serious  conditions like Stevens-Johnson syndrome and pneumonitis) and pembrolizumab (which can cause immune-related side effects such as inflammation of the lungs, liver, colon, thyroid, kidneys, and infusion reactions). -They were also informed of the EV-302 trial results, which showed that the combination of enfortumab vedotin and pembrolizumab significantly improved overall survival and progression-free survival compared to standard platinum-based chemotherapy in patients with untreated advanced or metastatic urothelial carcinoma.  -Will schedule chemotherapy education session. - Will send for [facility] NGS. - Monitor for side effects: rash, difficulty breathing, diarrhea. -Will consider port placement if venous access becomes difficult. - Will repeat PET scan in 3 months.  Return to clinic prior to second cycle of treatment to assess for tolerance  Chronic back pain due to bone metastases Back pain from bone metastases secondary to bladder cancer. Prefers medication management over radiation. Percocet twice daily provides adequate pain control.  - Continue Percocet for pain management. - Reassess pain management strategy if current regimen becomes ineffective. - Will consider adding bone modifying agents once patient is more stable.  This will need dental clearance as well.  Anemia under evaluation Anemia requires further evaluation to determine if iron deficiency or another underlying cause is present.  - Perform blood work to evaluate for iron deficiency.  Goals of care discussion We discussed goals of care in detail.  Patient has stage IV urothelial carcinoma with poor prognosis.  - Patient reported that he has a living will and he intends to be DNR/DNI at this time. - Patient also would like to consider treatment at this time. -Recommended patient to bring his [employer] to be scanned into the EMR with next visit.   Orders Placed This Encounter  Procedures   IR IMAGING GUIDED PORT  INSERTION    Standing Status:   Future    Expected Date:   04/11/2024    Expiration Date:   04/04/2025    Reason for Exam (SYMPTOM  OR DIAGNOSIS REQUIRED):   immunotherapy administration    Preferred Imaging Location?:   [facility]    Release to patient:   Immediate   Ambulatory Referral to [facility] Nutrition    Referral Priority:   Routine    Referral Type:   Consultation    Referral Reason:   Specialty Services Required    Number of Visits Requested:   1   Ambulatory referral to Social Work    Referral Priority:   Routine    Referral Type:   Consultation    Referral Reason:   Specialty Services Required    Number of Visits Requested:   1    The total time spent in the appointment was 60 minutes encounter with patients including review of chart and various tests results, discussions about plan of care and coordination of care plan   All questions were answered. The patient knows to call the clinic with any problems, questions or concerns. No barriers to learning was detected.   LILLETTE Verneta SAUNDERS Teague,acting as a Neurosurgeon for Mickiel Dry, MD.,have documented all relevant documentation on the behalf of Mickiel Dry, MD,as directed by  Mickiel Dry, MD while in the presence of Mickiel Dry, MD.  I, Mickiel Dry MD, have reviewed the above documentation for accuracy and completeness, and I agree with the above.    Mickiel Dry, MD 10/7/202510:48 PM

## 2024-04-04 NOTE — Patient Instructions (Addendum)
 [facility]  Discharge Instructions   You were seen and examined today by Dr. Davonna. Dr. Davonna is a medical oncologist, meaning that she specializes in the treatment of cancer diagnoses. Dr. Davonna discussed your past medical history, family history of cancers, and the events that led to you being here today.  You were seen today by Dr. Davonna for a new diagnosis of Stage IV (Metastatic) Urothelial Carcinoma. This is a type of cancer that arises in the urinary tract.  Because it is a Stage IV Cancer, it cannot be cured but it can be controlled with treatment. Treatment is given once a week for two weeks in a row every 21 days.  You will receive this treatment for as long as it continues to help control the cancer.  Follow-up as scheduled.  Thank you for choosing [name] to provide your oncology and hematology care.   To afford each patient quality time with our provider, please arrive at least 15 minutes before your scheduled appointment time. You may need to reschedule your appointment if you arrive late (10 or more minutes). Arriving late affects you and other patients whose appointments are after yours.  Also, if you miss three or more appointments without notifying the office, you may be dismissed from the clinic at the provider's discretion.    Again, thank you for choosing [facility].  Our hope is that these requests will decrease the amount of time that you wait before being seen by our physicians.   If you have a lab appointment with the Cancer Center - please note that after April 8th, all labs will be drawn in the cancer center.  You do not have to check in or register with the main entrance as you have in the past but will complete your check-in at the cancer center.            _____________________________________________________________  Should you have questions after your visit to [facility], please contact our office at [phone] and follow the prompts.  Our office hours are 8:00 a.m. to 4:30 p.m. Monday - Thursday and 8:00 a.m. to 2:30 p.m. Friday.  Please note that voicemails left after 4:00 p.m. may not be returned until the following business day.  We are closed weekends and all major holidays.  You do have access to a nurse 24-7, just call the main number to the clinic [phone] and do not press any options, hold on the line and a nurse will answer the phone.    For prescription refill requests, have your pharmacy contact our office and allow 72 hours.    Masks are no longer required in the cancer centers. If you would like for your care team to wear a mask while they are taking care of you, please let them know. You may have one support person who is at least 82 years old accompany you for your appointments.

## 2024-04-04 NOTE — Progress Notes (Signed)
 START ON PATHWAY REGIMEN - Bladder     A cycle is every 21 days:     Enfortumab vedotin-ejfv      Pembrolizumab   **Always confirm dose/schedule in your pharmacy ordering system**  Patient Characteristics: Advanced/Metastatic Disease, First Line Therapeutic Status: Advanced/Metastatic Disease Line of Therapy: First Line Intent of Therapy: Non-Curative / Palliative Intent, Discussed with Patient

## 2024-04-05 ENCOUNTER — Other Ambulatory Visit: Payer: Self-pay | Admitting: Oncology

## 2024-04-05 ENCOUNTER — Encounter: Payer: Self-pay | Admitting: Oncology

## 2024-04-05 ENCOUNTER — Ambulatory Visit: Admitting: Nurse Practitioner

## 2024-04-05 ENCOUNTER — Other Ambulatory Visit: Payer: Self-pay | Admitting: *Deleted

## 2024-04-05 MED ORDER — HYDROCODONE-ACETAMINOPHEN 5-325 MG PO TABS: 1.0000 | ORAL_TABLET | Freq: Four times a day (QID) | ORAL | 0 refills | Status: DC | PRN

## 2024-04-06 ENCOUNTER — Other Ambulatory Visit: Payer: Self-pay

## 2024-04-06 ENCOUNTER — Inpatient Hospital Stay

## 2024-04-06 DIAGNOSIS — C689 Malignant neoplasm of urinary organ, unspecified: Secondary | ICD-10-CM

## 2024-04-06 MED ORDER — PROCHLORPERAZINE MALEATE 10 MG PO TABS: 10.0000 mg | ORAL_TABLET | Freq: Four times a day (QID) | ORAL | 1 refills | Status: DC | PRN

## 2024-04-06 MED ORDER — LIDOCAINE-PRILOCAINE 2.5-2.5 % EX CREA: TOPICAL_CREAM | CUTANEOUS | 3 refills | Status: DC

## 2024-04-06 MED ORDER — CARBOXYMETHYLCELL-GLYCERIN PF 0.5-0.9 % OP SOLN: 2.0000 [drp] | Freq: Four times a day (QID) | OPHTHALMIC | 11 refills | Status: DC

## 2024-04-06 MED ORDER — ONDANSETRON HCL 8 MG PO TABS: 8.0000 mg | ORAL_TABLET | Freq: Three times a day (TID) | ORAL | 1 refills | Status: DC | PRN

## 2024-04-06 NOTE — Progress Notes (Signed)

## 2024-04-06 NOTE — Patient Instructions (Addendum)
 [facility] Chemotherapy Teaching    You have been diagnosed with Stage 4 Urothelial cancer.  You will receive the treatment Padcev and Keytruda for your cancer.  The intent of treatment is Palliative.  This means to prevent the further spread and manage your symptoms per your oncologist.   You will see the doctor regularly throughout treatment.  We will obtain blood work from you prior to every treatment and monitor your results to make sure it is safe to give your treatment. The doctor monitors your response to treatment by the way you are feeling, your blood work, and by obtaining scans periodically.  There will be wait times while you are here for treatment.  It will take about 30 minutes to 1 hour for your lab work to result.  Then there will be wait times while pharmacy mixes your medications.    Pre meds:  Compazine:  this is to help prevent any nausea you may have from your treatment.    Pembrolizumab Sigmund)  About This Drug Pembrolizumab is used to treat cancer. It is given in the vein (IV).  This drug will take 30 minutes to infuse.  Possible Side Effects  Tiredness  Fever  Nausea  Decreased appetite (decreased hunger)  Loose bowel movements (diarrhea)  Constipation (not able to move bowels)  Trouble breathing  Rash  Itching  Muscle and bone pain  Cough  Note: Each of the side effects above was reported in 20% or greater of patients treated with pembrolizumab. Not all possible side effects are included above.  Warnings and Precautions   This drug works with your immune system and can cause inflammation in any of your organs and tissues and can change how they work. This may put you at risk for developing serious medical problems which can very rarely be fatal.   Colitis (swelling or inflammation in the colon) - symptoms are loose bowel movements (diarrhea) stomach cramping, and sometimes blood in the stool   Changes in liver function. Your liver  function will be checked as needed.   Changes in kidney function, which can very rarely be fatal. Your kidney function will be checked as needed.   Inflammation (swelling) of the lungs which can very rarely be fatal - you may have a dry cough or trouble breathing.   This drug may affect some of your hormone glands (especially the thyroid, adrenals, pituitary and pancreas). Your hormone levels will be checked as needed.   Blood sugar levels may change and you may develop diabetes. If you already have diabetes, changes may need to be made to your diabetes medication.   Severe allergic skin reaction, which can very rarely be fatal. You may develop blisters on your skin that are filled with fluid or a severe red rash all over your body that may be painful.   Increased risk of organ rejection in patients who have received donor organs   Increased risk of complications in patients who will undergo a stem cell transplant after receiving pembrolizumab.   While you are getting this drug in your vein (IV), you may have a reaction to the drug. Your nurse will check you closely for these signs: fever or shaking chills, flushing, facial swelling, feeling dizzy, headache, trouble breathing, rash, itching, chest tightness, or chest pain. These reactions may occur after your infusion. If this happens, call 911 for emergency care.  Important Information This drug may be present in the saliva, tears, sweat, urine, stool, vomit, semen,  and vaginal secretions. Talk to your doctor and/or your nurse about the necessary precautions to take during this time.  Treating Side Effects   Ask your doctor or nurse about medicines that are available to help stop or lessen constipation, diarrhea and/or nausea.   Drink plenty of fluids (a minimum of eight glasses per day is recommended).   If you are not able to move your bowels, check with your doctor or nurse before you use any enemas, laxatives, or suppositories   To  help with nausea and vomiting, eat small, frequent meals instead of three large meals a day. Choose foods and drinks that are at room temperature. Ask your nurse or doctor about other helpful tips and medicine that is available to help or stop lessen these symptoms.   If you get diarrhea, eat low-fiber foods that are high in protein and calories and avoid foods that can irritate your digestive tracts or lead to cramping. Ask your nurse or doctor about medicine that can lessen or stop your diarrhea.   Manage tiredness by pacing your activities for the day. Be sure to include periods of rest between energy-draining activities   Keeping your pain under control is important to your wellbeing. Please tell your doctor or nurse if you are experiencing pain.   If you have diabetes, keep good control of your blood sugar level. Tell your nurse or your doctor if your glucose levels are higher or lower than normal   If you get a rash do not put anything on it unless your doctor or nurse says you may. Keep the area around the rash clean and dry. Ask your doctor for medicine if your rash bothers you.   Infusion reactions may happen for 24 hours after your infusion. If this happens, call 911 for emergency care.  Food and Drug Interactions   There are no known interactions of pembrolizumab with food.   There are no known interactions of pembrolizumab with other medications.   Tell your doctor and pharmacist about all the medicines and dietary supplements (vitamins, minerals, herbs and others) that you are taking at this time. The safety and use of dietary supplements and alternative agents are often not known. Using these might affect your cancer or interfere with your treatment. Until more is known, you should not use dietary supplements or alternative agents without your cancer doctor's help.   When to Call the Doctor Call your doctor or nurse if you have any of the following symptoms and/or any new or  unusual symptoms:   Fever of 100.4 F (38 C) or higher   Chills   Wheezing or trouble breathing   Rash or itching   Feeling dizzy or lightheaded   Loose bowel movements (diarrhea) more than 4 times a day or diarrhea with weakness or lightheadedness, or diarrhea that is not controlled by medications   Nausea that stops you from eating or drinking, and/or that is not relieved by prescribed medicines   Lasting loss of appetite or rapid weight loss of five pounds in a week   Fatigue that interferes with your daily activities   No bowel movement for 3 days or you feel uncomfortable   Extreme weakness that interferes with normal activities   Bad abdominal pain, especially in upper right area   Decreased urine   Unusual thirst or passing urine often   Rash that is not relieved by prescribed medicines   Flu-like symptoms: fever, headache, muscle and joint aches, and fatigue (low  energy, feeling weak)   Signs of liver problems: dark urine, pale bowel movements, bad stomach pain, feeling very tired and weak, unusual itching, or yellowing of the eyes or skin   Signs of infusion reactions such as fever or shaking chills, flushing, facial swelling, feeling dizzy, headache, trouble breathing, rash, itching, chest tightness, or chest pain.   Reproduction Warnings   Pregnancy warning: This drug may have harmful effects on the unborn baby. Women of childbearing potential should use effective methods of birth control during your cancer treatment and for at least 4 months after treatment. Let your doctor know right away if you think you may be pregnant   Breast feeding warning: It is not known if this drug passes into breast milk. It is recommended that women do not breastfeed during treatment and for 4 months after treatment.   Fertility warning: Human fertility studies have not been done with this drug. Talk with your doctor or nurse if you plan to have children.   About Enfortumab  Vedotin-ejfv (PADCEVT)   Monoclonal antibodies are created in a lab to attach to the targets, in this case Nectin 4, found on specific types of cancer cells. The antibody "calls" the immune system to attack the cell it is attached to, resulting in the immune system killing the cell.   Possible Side Effects  Skin Changes Peripheral Neuropathy (Numbness or Tingling in the Hands and/or Feet) Fatigue Decrease in Appetite or Taste Changes Loss or Thinning of Scalp and Body Hair (Alopecia) Eye Problems Nausea and/or Vomiting Diarrhea Low Red Blood Cell Count (Anemia) Low White Blood Cell Count (Leukopenia or Neutropenia) Less common, but important side effects can include: Reproductive Concerns How to Take Enfortumab Vedotin-ejfv Enfortumab vedotin-ejfv is given as an intravenous infusion. The dose is based on your weight. How often you receive the medication will be determined by your provider.  Even when carefully and correctly administered by trained personnel, this drug may cause a feeling of burning and pain. There is a risk that this drug may leak out of the vein at the injection site, resulting in tissue damage. If the area of injection becomes red, swollen, or painful at any time during or after the injection, notify your doctor or nurse immediately. Do not apply anything to the site unless instructed by your doctor or nurse.  The blood levels of this medication can be affected by certain foods and medications, so they should be avoided. These include grapefruit, grapefruit juice, Apalutamide, Atazanavir, Carbamazepine, Clarithromycin, Cobicistate, Darunavir, Dasabuvir, Enzalutamide, Fosphenytoin, Idelalisib, Indinavir, Itraconazole, Ketoconazole, Lopinavir, Lumacaftor, Mifepristone, Mitotane, Nefazodone, Nelfinavir, Ombitasvir, Paritaprevir, Phenobarbital, Phenytoin, Posaconazole, Primidone, Rifampin, Ritonavir, Saquinavir, Telithromycin, and Voriconazole, among others. Be sure to tell  your healthcare provider about all medications and supplements you take.  Possible Side Effects  There are a number of things you can do to manage the side effects of enfortumab vedotin-ejfv. Talk to your care team about these recommendations. They can help you decide what will work best for you. These are some of the most common or important side effects:  Skin Changes  Some patients may develop a rash, dry skin or itching. In some cases, you can develop a very serious skin reaction in which your skin can blister or peel. These reactions are called either Elspeth Dynes syndrome or toxic epidermal necrolysis. You should contact your care team right away if you have any changes to your skin. They will be able to tell you how to best care for your skin  depending on your reaction.  Peripheral Neuropathy (Numbness or Tingling in the Hands and/or Feet)  Peripheral neuropathy is a toxicity that affects the nerves. It causes numbness or a tingling feeling in the hands and/or feet, often in the pattern of a stocking or glove. This can get progressively worse with additional doses of the medication. In some people, the symptoms slowly resolve after the medication is stopped, but for some it never goes away completely. You should let the oncology care team know if you experience numbness or tingling in the hands and/or feet, as they may need to adjust the doses of your medication.  Fatigue  Fatigue is very common during cancer treatment and is an overwhelming feeling of exhaustion that is not usually relieved by rest. While on cancer treatment, and for a period after, you may need to adjust your schedule to manage fatigue. Plan times to rest during the day and conserve energy for more important activities. Exercise can help combat fatigue; a simple daily walk with a friend can help. Talk to your healthcare team for helpful tips on dealing with this side effect.  Decrease in Appetite or Taste  Changes  Nutrition is an important part of your care. Cancer treatment can affect your appetite and, in some cases, the side effects of treatment can make eating difficult. Ask your oncology care team about nutritional counseling services at your treatment center to help with food choices.  Try to eat five or six small meals or snacks throughout the day, instead of 3 larger meals. If you are not eating enough, nutritional supplements may help. You may experience a metallic taste or find that food has no taste at all. You may dislike foods or beverages that you liked before receiving cancer treatment. These symptoms can last for several months or longer after treatment ends. Avoid any food that you think smells or tastes bad. If red meat is a problem, eat chicken, [place], eggs, dairy products, and fish without a strong smell. Sometimes cold food has less of an odor. Add extra flavor to meat or fish by marinating it in sweet juices, sweet and sour sauce or dressings. Use seasonings like basil, oregano, or rosemary to add flavor. Bacon, ham, and onion can add flavor to vegetables. Loss or Thinning of Scalp and Body Hair (Alopecia) Your hair may become thin, brittle, or may fall out. This typically begins two to three weeks after treatment starts. This hair loss can be all body hair, including pubic, underarm, legs/arms, eyelashes, and nose hairs. The use of scarves, wigs, hats, and hairpieces may help. Hair generally starts to regrow soon after treatment is completed. Remember your hair helps keep you warm in cold weather, so a hat is particularly important in cold weather or to protect you from the sun.  Eye Problems  This medication can cause changes to your sight including blurry vision and dryness. You may be instructed to use eye drops that work as tear substitutes. Talk to your provider if you are having any changes in your vision.  Nausea and/or Vomiting  Talk to your oncology care team so they  can prescribe medications to help you manage nausea and vomiting. In addition, dietary changes may help. Avoid things that may worsen the symptoms, such as heavy or greasy/fatty, spicy or acidic foods (lemons, tomatoes, oranges). Try saltines, or ginger ale to lessen symptoms.  Call your oncology care team if you are unable to keep fluids down for more than 12 hours or  if you feel lightheaded or dizzy at any time.  Diarrhea  Your oncology care team can recommend medications to relieve diarrhea. Also, try eating low-fiber, bland foods, such as white rice and boiled or baked chicken. Avoid raw fruits, vegetables, whole grain breads, cereals, and seeds. Soluble fiber is found in some foods and absorbs fluid, which can help relieve diarrhea. Foods high in soluble fiber include: applesauce, bananas (ripe), canned fruit, orange sections, boiled potatoes, white rice, products made with white flour, oatmeal, cream of rice, cream of wheat, and farina. Drink 8-10 glasses of non-alcoholic, un-caffeinated fluid a day to prevent dehydration.  Low Red Blood Cell Count (Anemia)  Your red blood cells are responsible for carrying oxygen to the tissues in your body. When the red cell count is low, you may feel tired or weak. You should let your oncology care team know if you experience any shortness of breath, difficulty breathing, or pain in your chest. If the count gets too low, you may receive a blood transfusion.  Low White Blood Cell Count (Leukopenia or Neutropenia)  White blood cells (WBC) are important for fighting infection. While receiving treatment, your WBC count can drop, putting you at a higher risk of getting an infection. You should let your doctor or nurse know right away if you have a fever (temperature greater than 100.74F or 38C), sore throat or cold, shortness of breath, cough, burning with urination, or a sore that doesn't heal.  Tips to preventing infection:  Washing hands, both yours and  your visitors, is the best way to prevent the spread of infection. Avoid large crowds and people who are sick (i.e.: those who have a cold, fever or cough or live with someone with these symptoms). When working in your yard, wear protective clothing including long pants and gloves. Do not handle pet waste. Keep all cuts or scratches clean. Shower or bathe daily and perform frequent mouth care. Do not cut cuticles or ingrown nails. You may wear nail polish, but not fake nails. Ask your oncology care team before scheduling dental appointments or procedures. Ask your oncology care team before you, or someone you live with has any vaccinations. Less common, but important side effects can include: High Blood Sugar: This medication can cause elevated blood sugar levels in patients with and without diabetes. Your oncology care team will monitor your blood sugar. If you develop increased thirst, urination or hunger, blurry vision, headaches or your breath smells like fruit, notify your healthcare team. Diabetics should monitor their blood sugar closely and report elevations to the healthcare team. Pneumonitis: Patients can develop an inflammation of the lungs (called pneumonitis) while taking this medication. Notify your oncology care team right away if you develop any new or worsening symptoms, including shortness of breath, trouble breathing, cough, or fever. Reproductive Concerns Exposure of an unborn child to this medication could cause birth defects, so you should not become pregnant or father a child while on this medication. Effective birth control is necessary during treatment and for at least 2 months after treatment for women and for 4 months after treatment for men. Even if your menstrual cycle stops or you believe you are not producing sperm, you could still be fertile and conceive. You should not breastfeed while receiving this medication or for 3 weeks after your last dose.   SELF CARE  ACTIVITIES WHILE ON CHEMOTHERAPY/IMMUNOTHERAPY:  Hydration Increase your fluid intake 48 hours prior to treatment and drink at least 8 to 12 cups (64  ounces) of water /decaffeinated beverages per day after treatment. You can still have your cup of coffee or soda but these beverages do not count as part of your 8 to 12 cups that you need to drink daily. No alcohol intake.  Medications Continue taking your normal prescription medication as prescribed.  If you start any new herbal or new supplements please let us  know first to make sure it is safe.  Mouth Care Have teeth cleaned professionally before starting treatment. Keep dentures and partial plates clean. Use soft toothbrush and do not use mouthwashes that contain alcohol. Biotene is a good mouthwash that is available at most pharmacies or may be ordered by calling [phone]. Use warm salt water  gargles (1 teaspoon salt per 1 quart warm water ) before and after meals and at bedtime. Or you may rinse with 2 tablespoons of three-percent hydrogen peroxide mixed in eight ounces of water . If you are still having problems with your mouth or sores in your mouth please call the clinic. If you need dental work, please let the doctor know before you go for your appointment so that we can coordinate the best possible time for you in regards to your chemo regimen. You need to also let your dentist know that you are actively taking chemo. We may need to do labs prior to your dental appointment.  Skin Care Always use sunscreen that has not expired and with SPF (Sun Protection Factor) of 50 or higher. Wear hats to protect your head from the sun. Remember to use sunscreen on your hands, ears, face, & feet.  Use good moisturizing lotions such as udder cream, eucerin, or even Vaseline. Some chemotherapies can cause dry skin, color changes in your skin and nails.    Avoid long, hot showers or baths. Use gentle, fragrance-free soaps and laundry detergent. Use  moisturizers, preferably creams or ointments rather than lotions because the thicker consistency is better at preventing skin dehydration. Apply the cream or ointment within 15 minutes of showering. Reapply moisturizer at night, and moisturize your hands every time after you wash them.   Infection Prevention Please wash your hands for at least 30 seconds using warm soapy water . Handwashing is the #1 way to prevent the spread of germs. Stay away from sick people or people who are getting over a cold. If you develop respiratory systems such as green/yellow mucus production or productive cough or persistent cough let us  know and we will see if you need an antibiotic. It is a good idea to keep a pair of gloves on when going into grocery stores/[employer] to decrease your risk of coming into contact with germs on the carts, etc. Carry alcohol hand gel with you at all times and use it frequently if out in public. If your temperature reaches 100.5 or higher please call the clinic and let us  know.  If it is after hours or on the weekend please go to the ER if your temperature is over 100.4.  Please have your own personal thermometer at home to use.    Sex and bodily fluids If you are going to have sex, a condom must be used to protect the person that isn't taking immunotherapy. For a few days after treatment, immunotherapy can be excreted through your bodily fluids.  When using the toilet please close the lid and flush the toilet twice.  Do this for a few day after you have had immunotherapy.   Contraception It is not known for sure whether or not  immunotherapy drugs can be passed on through semen or secretions from the vagina. Because of this some doctors advise people to use a barrier method if you have sex during treatment. This applies to vaginal, anal or oral sex.  Generally, doctors advise a barrier method only for the time you are actually having the treatment and for about a week after your  treatment.  Advice like this can be worrying, but this does not mean that you have to avoid being intimate with your partner. You can still have close contact with your partner and continue to enjoy sex.  Animals If you have cats or birds we just ask that you not change the litter or change the cage.  Please have someone else do this for you while you are on immunotherapy.   Food Safety During and After Cancer Treatment Food safety is important for people both during and after cancer treatment. Cancer and cancer treatments, such as chemotherapy, radiation therapy, and stem cell/bone marrow transplantation, often weaken the immune system. This makes it harder for your body to protect itself from foodborne illness, also called food poisoning. Foodborne illness is caused by eating food that contains harmful bacteria, parasites, or viruses.  Foods to avoid Some foods have a higher risk of becoming tainted with bacteria. These include: Unwashed fresh fruit and vegetables, especially leafy vegetables that can hide dirt and other contaminants Raw sprouts, such as alfalfa sprouts Raw or undercooked beef, especially ground beef, or other raw or undercooked meat and poultry Fatty, fried, or spicy foods immediately before or after treatment.  These can sit heavy on your stomach and make you feel nauseous. Raw or undercooked shellfish, such as oysters. Sushi and sashimi, which often contain raw fish.  Unpasteurized beverages, such as unpasteurized fruit juices, raw milk, raw yogurt, or cider Undercooked eggs, such as soft boiled, over easy, and poached; raw, unpasteurized eggs; or foods made with raw egg, such as homemade raw cookie dough and homemade mayonnaise  Simple steps for food safety  Shop smart. Do not buy food stored or displayed in an unclean area. Do not buy bruised or damaged fruits or vegetables. Do not buy cans that have cracks, dents, or bulges. Pick up foods that can spoil at the end  of your shopping trip and store them in a cooler on the way home.  Prepare and clean up foods carefully. Rinse all fresh fruits and vegetables under running water , and dry them with a clean towel or paper towel. Clean the top of cans before opening them. After preparing food, wash your hands for 20 seconds with hot water  and soap. Pay special attention to areas between fingers and under nails. Clean your utensils and dishes with hot water  and soap. Disinfect your kitchen and cutting boards using 1 teaspoon of liquid, unscented bleach mixed into 1 quart of water .    Dispose of old food. Eat canned and packaged food before its expiration date (the "use by" or "best before" date). Consume refrigerated leftovers within 3 to 4 days. After that time, throw out the food. Even if the food does not smell or look spoiled, it still may be unsafe. Some bacteria, such as Listeria, can grow even on foods stored in the refrigerator if they are kept for too long.  Take precautions when eating out. At restaurants, avoid buffets and salad bars where food sits out for a long time and comes in contact with many people. Food can become contaminated when someone with a  virus, often a norovirus, or another "bug" handles it. Put any leftover food in a "to-go" container yourself, rather than having the server do it. And, refrigerate leftovers as soon as you get home. Choose restaurants that are clean and that are willing to prepare your food as you order it cooked.    SYMPTOMS TO REPORT AS SOON AS POSSIBLE AFTER TREATMENT:  FEVER GREATER THAN 100.4 F  CHILLS WITH OR WITHOUT FEVER  NAUSEA AND VOMITING THAT IS NOT CONTROLLED WITH YOUR NAUSEA MEDICATION  UNUSUAL SHORTNESS OF BREATH  UNUSUAL BRUISING OR BLEEDING  TENDERNESS IN MOUTH AND THROAT WITH OR WITHOUT PRESENCE OF ULCERS  URINARY PROBLEMS  BOWEL PROBLEMS  UNUSUAL RASH     Wear comfortable clothing and clothing appropriate for easy access to any  Portacath or PICC line. Let us  know if there is anything that we can do to make your therapy better!   What to do if you need assistance after hours or on the weekends: CALL [phone].  HOLD on the line, do not hang up.  You will hear multiple messages but at the end you will be connected with a nurse triage line.  They will contact the doctor if necessary.  Most of the time they will be able to assist you.  Do not call the hospital operator.    I have been informed and understand all of the instructions given to me and have received a copy. I have been instructed to call the clinic [phone] or my family physician as soon as possible for continued medical care, if indicated. I do not have any more questions at this time but understand that I may call the Cancer Center or the Patient Navigator at [phone] during office hours should I have questions or need assistance in obtaining follow-up care.

## 2024-04-10 ENCOUNTER — Encounter: Payer: Self-pay | Admitting: Oncology

## 2024-04-10 ENCOUNTER — Other Ambulatory Visit (HOSPITAL_COMMUNITY): Payer: Self-pay

## 2024-04-10 ENCOUNTER — Telehealth: Payer: Self-pay | Admitting: Pharmacy Technician

## 2024-04-10 NOTE — Telephone Encounter (Signed)
 Oral Oncology Patient Advocate Encounter   New authorization   Received notification that prior authorization for lidocaine -prilocaine cream is required.   PA submitted on CMM via Latent Key [id number] Status is pending     Shanley Furlough (Patty) Chet Burnet, CPhT  [facility], [name], [place] H[facility]ology/Oncology - Oral Chemotherapy Patient Advocate Specialist III Phone: [phone]  Fax: [phone]

## 2024-04-10 NOTE — Telephone Encounter (Signed)
 Oral Oncology Patient Advocate Encounter  Prior Authorization for lidocaine -prilocaine cream has been approved.    PA# [id number] Effective dates: 04/10/2024 through 07/09/2024  Barbette (Patty) Chet Burnet, CPhT  [facility] - [facility], [name], [place] Hematology/Oncology - Oral Chemotherapy Patient Advocate Specialist III Phone: [phone]  Fax: [phone]

## 2024-04-10 NOTE — Telephone Encounter (Signed)
 Oral Oncology Patient Advocate Encounter   New authorization   Received notification that prior authorization for ondansetron  is required.   PA submitted on CMM via Latent Key [id number] Status is pending     Dean Randolph (Patty) Chet Burnet, CPhT  [facility], [name], [place] [facility] - Oral Chemotherapy Patient Advocate Specialist III Phone: [phone]  Fax: [phone]

## 2024-04-12 ENCOUNTER — Inpatient Hospital Stay: Admitting: Licensed Clinical Social Worker

## 2024-04-12 ENCOUNTER — Other Ambulatory Visit: Payer: Self-pay | Admitting: *Deleted

## 2024-04-12 ENCOUNTER — Other Ambulatory Visit (HOSPITAL_COMMUNITY): Payer: Self-pay

## 2024-04-12 ENCOUNTER — Encounter: Payer: Self-pay | Admitting: Oncology

## 2024-04-12 ENCOUNTER — Encounter (INDEPENDENT_AMBULATORY_CARE_PROVIDER_SITE_OTHER): Payer: Self-pay | Admitting: Gastroenterology

## 2024-04-12 ENCOUNTER — Telehealth: Payer: Self-pay | Admitting: *Deleted

## 2024-04-12 DIAGNOSIS — C689 Malignant neoplasm of urinary organ, unspecified: Secondary | ICD-10-CM

## 2024-04-12 MED ORDER — HYDROCODONE-ACETAMINOPHEN 5-325 MG PO TABS: 2.0000 | ORAL_TABLET | Freq: Four times a day (QID) | ORAL | 0 refills | Status: DC | PRN

## 2024-04-12 NOTE — Telephone Encounter (Signed)
 Oral Oncology Patient Advocate Encounter  Prior Authorization for ondansetron  has been approved.    PA# [id number] Effective dates: 04/10/2024 through 06/28/2025  Barbette (Patty) Chet Burnet, CPhT  [facility] - [facility], [name], [place] Hematology/Oncology - Oral Chemotherapy Patient Advocate Specialist III Phone: [phone]  Fax: [phone]

## 2024-04-12 NOTE — Telephone Encounter (Signed)
 Patient expressed that current hydrocodone  dosei s not holding his pain off long enough. Per Dr. Davonna, patient instructed that he could take 2 tablets every 6 hours as needed.  Advised to make us  aware if this is not effective.  Verbalized understanding.

## 2024-04-12 NOTE — Progress Notes (Signed)
 [facility] Clinical Social Work  Initial Assessment   Dean Randolph is a 83 y.o. year old male contacted by phone. Clinical Social Work was referred by medical provider for assessment of psychosocial needs.   SDOH (Social Determinants of Health) assessments performed: Yes SDOH Interventions    Flowsheet Row Office Visit from 03/23/2024 in [facility] - [facility] Of [facility]  SDOH Interventions   Food Insecurity Interventions Intervention Not Indicated  Housing Interventions Intervention Not Indicated  Transportation Interventions Intervention Not Indicated  Utilities Interventions Intervention Not Indicated    SDOH Screenings   Food Insecurity: No Food Insecurity (03/23/2024)  Housing: Low Risk  (03/23/2024)  Transportation Needs: No Transportation Needs (03/23/2024)  Utilities: Not At Risk (03/23/2024)  Depression (PHQ2-9): Low Risk  (04/04/2024)  Tobacco Use: Low Risk  (03/27/2024)    PHQ 2/9:    04/04/2024   11:03 AM 03/23/2024    8:10 AM 06/13/2021    9:00 AM  Depression screen PHQ 2/9  Decreased Interest 0 0 0  Down, Depressed, Hopeless 0 0 0  PHQ - 2 Score 0 0 0     Distress Screen completed: Yes    04/06/2024    9:38 AM  ONCBCN DISTRESS SCREENING  Screening Type Initial Screening  How much distress have you been experiencing in the past week? (0-10) 0      Family/Social Information:  Housing Arrangement: patient lives with his wife and reports that his son is there most of the time.   Family members/support persons in your life? Pt also has a daughter residing in [place] who assists as needed.  Transportation concerns: no  Employment: Retired .  Income source: Actor concerns: No Type of concern: None Food access concerns: no Religious or spiritual practice: Not known Advanced directives: Pt reports he has advanced directives completed which he will bring in to be scanned to his chart. Services  Currently in place:  none  Coping/ Adjustment to diagnosis: Patient understands treatment plan and what happens next? yes Concerns about diagnosis and/or treatment: Quality of life Patient reported stressors: Adjusting to my illness and Physical issues Hopes and/or priorities: pt's priority is to start treatment w/ the hope of positive results. Patient enjoys being outside and time with family/ friends Current coping skills/ strengths: Capable of independent living , Motivation for treatment/growth , Physical Health , and Supportive family/friends     SUMMARY: Current SDOH Barriers:  No barriers identified at this time.  Clinical Social Work Clinical Goal(s):  No clinical social work goals at this time  Interventions: Discussed common feeling and emotions when being diagnosed with cancer, and the importance of support during treatment Informed patient of the support team roles and support services at [facility] Provided CSW contact information and encouraged patient to call with any questions or concerns Pt reports his wife is connected to [employer] as she received treatment for leukemia for 8 years.  Pt's wife will call Wadie Rung if they need supportive services.  Pt reports his pain has significantly increased in the past couple of days.  CSW informed RN who will contact pt to make adjustments if appropriate.     Follow Up Plan: Patient will contact CSW with any support or resource needs Patient verbalizes understanding of plan: Yes    Devere JONELLE Manna, LCSW Clinical Social Worker [facility]

## 2024-04-13 ENCOUNTER — Encounter (HOSPITAL_COMMUNITY): Payer: Self-pay

## 2024-04-14 ENCOUNTER — Encounter (HOSPITAL_COMMUNITY): Payer: Self-pay

## 2024-04-17 ENCOUNTER — Inpatient Hospital Stay

## 2024-04-17 VITALS — BP 146/84 | HR 88 | Temp 97.4°F | Resp 17 | Wt 159.0 lb

## 2024-04-17 DIAGNOSIS — C689 Malignant neoplasm of urinary organ, unspecified: Secondary | ICD-10-CM

## 2024-04-17 DIAGNOSIS — Z5111 Encounter for antineoplastic chemotherapy: Secondary | ICD-10-CM | POA: Diagnosis not present

## 2024-04-17 LAB — CBC WITH DIFFERENTIAL/PLATELET
Abs Immature Granulocytes: 0.04 K/uL (ref 0.00–0.07)
Basophils Absolute: 0.1 K/uL (ref 0.0–0.1)
Basophils Relative: 1 %
Eosinophils Absolute: 0.1 K/uL (ref 0.0–0.5)
Eosinophils Relative: 1 %
HCT: 33.3 % — ABNORMAL LOW (ref 39.0–52.0)
Hemoglobin: 11.4 g/dL — ABNORMAL LOW (ref 13.0–17.0)
Immature Granulocytes: 0 %
Lymphocytes Relative: 9 %
Lymphs Abs: 0.9 K/uL (ref 0.7–4.0)
MCH: 34.8 pg — ABNORMAL HIGH (ref 26.0–34.0)
MCHC: 34.2 g/dL (ref 30.0–36.0)
MCV: 101.5 fL — ABNORMAL HIGH (ref 80.0–100.0)
Monocytes Absolute: 0.8 K/uL (ref 0.1–1.0)
Monocytes Relative: 8 %
Neutro Abs: 8.6 K/uL — ABNORMAL HIGH (ref 1.7–7.7)
Neutrophils Relative %: 81 %
Platelets: 267 K/uL (ref 150–400)
RBC: 3.28 MIL/uL — ABNORMAL LOW (ref 4.22–5.81)
RDW: 16.9 % — ABNORMAL HIGH (ref 11.5–15.5)
WBC: 10.5 K/uL (ref 4.0–10.5)
nRBC: 0 % (ref 0.0–0.2)

## 2024-04-17 LAB — COMPREHENSIVE METABOLIC PANEL WITH GFR
ALT: 19 U/L (ref 0–44)
AST: 25 U/L (ref 15–41)
Albumin: 3.8 g/dL (ref 3.5–5.0)
Alkaline Phosphatase: 140 U/L — ABNORMAL HIGH (ref 38–126)
Anion gap: 12 (ref 5–15)
BUN: 22 mg/dL (ref 8–23)
CO2: 25 mmol/L (ref 22–32)
Calcium: 9.4 mg/dL (ref 8.9–10.3)
Chloride: 99 mmol/L (ref 98–111)
Creatinine, Ser: 0.89 mg/dL (ref 0.61–1.24)
GFR, Estimated: >60 mL/min (ref >60)
Glucose, Bld: 104 mg/dL — ABNORMAL HIGH (ref 70–99)
Potassium: 3.5 mmol/L (ref 3.5–5.1)
Sodium: 137 mmol/L (ref 135–145)
Total Bilirubin: 0.6 mg/dL (ref 0.0–1.2)
Total Protein: 8.1 g/dL (ref 6.5–8.1)

## 2024-04-17 LAB — TSH: TSH: 1.1 u[IU]/mL (ref 0.350–4.500)

## 2024-04-17 MED ORDER — PROCHLORPERAZINE MALEATE 10 MG PO TABS
10.0000 mg | ORAL_TABLET | Freq: Once | ORAL | Status: AC
  Administered 2024-04-17: 10 mg via ORAL
  Filled 2024-04-17: qty 1

## 2024-04-17 MED ORDER — SODIUM CHLORIDE 0.9 % IV SOLN: INTRAVENOUS | Status: DC

## 2024-04-17 MED ORDER — SODIUM CHLORIDE 0.9 % IV SOLN
200.0000 mg | Freq: Once | INTRAVENOUS | Status: AC
  Administered 2024-04-17: 200 mg via INTRAVENOUS
  Filled 2024-04-17: qty 8

## 2024-04-17 MED ORDER — SODIUM CHLORIDE 0.9 % IV SOLN
1.2500 mg/kg | Freq: Once | INTRAVENOUS | Status: AC
  Administered 2024-04-17: 90 mg via INTRAVENOUS
  Filled 2024-04-17: qty 9

## 2024-04-17 NOTE — Patient Instructions (Signed)
 [facility] [place] - A DEPT OF [place]  Discharge Instructions: Thank you for choosing [facility] to provide your oncology and hematology care.  If you have a lab appointment with the Cancer Center - please note that after April 8th, 2024, all labs will be drawn in the cancer center.  You do not have to check in or register with the main entrance as you have in the past but will complete your check-in in the cancer center.  Wear comfortable clothing and clothing appropriate for easy access to any Portacath or PICC line.   We strive to give you quality time with your provider. You may need to reschedule your appointment if you arrive late (15 or more minutes).  Arriving late affects you and other patients whose appointments are after yours.  Also, if you miss three or more appointments without notifying the office, you may be dismissed from the clinic at the provider's discretion.      For prescription refill requests, have your pharmacy contact our office and allow 72 hours for refills to be completed.    Today you received the following chemotherapy and/or immunotherapy agents enfortumab, pembrolizumab   To help prevent nausea and vomiting after your treatment, we encourage you to take your nausea medication as directed.  BELOW ARE SYMPTOMS THAT SHOULD BE REPORTED IMMEDIATELY: *FEVER GREATER THAN 100.4 F (38 C) OR HIGHER *CHILLS OR SWEATING *NAUSEA AND VOMITING THAT IS NOT CONTROLLED WITH YOUR NAUSEA MEDICATION *UNUSUAL SHORTNESS OF BREATH *UNUSUAL BRUISING OR BLEEDING *URINARY PROBLEMS (pain or burning when urinating, or frequent urination) *BOWEL PROBLEMS (unusual diarrhea, constipation, pain near the anus) TENDERNESS IN MOUTH AND THROAT WITH OR WITHOUT PRESENCE OF ULCERS (sore throat, sores in mouth, or a toothache) UNUSUAL RASH, SWELLING OR PAIN  UNUSUAL VAGINAL DISCHARGE OR ITCHING   Items with * indicate a potential emergency and should  be followed up as soon as possible or go to the Emergency Department if any problems should occur.  Please show the CHEMOTHERAPY ALERT CARD or IMMUNOTHERAPY ALERT CARD at check-in to the Emergency Department and triage nurse.  Should you have questions after your visit or need to cancel or reschedule your appointment, please contact [facility] CTR [place] - A DEPT OF [facility] [phone]  and follow the prompts.  Office hours are 8:00 a.m. to 4:30 p.m. Monday - Friday. Please note that voicemails left after 4:00 p.m. may not be returned until the following business day.  We are closed weekends and major holidays. You have access to a nurse at all times for urgent questions. Please call the main number to the clinic [phone] and follow the prompts.  For any non-urgent questions, you may also contact your provider using MyChart. We now offer e-Visits for anyone 22 and older to request care online for non-urgent symptoms. For details visit [email or web address].   Also download the MyChart app! Go to the app store, search MyChart, open the app, select [place], and log in with your MyChart username and password.

## 2024-04-17 NOTE — Progress Notes (Signed)
 Labs reviewed , ok to treat today per parameters.   Treatment given per orders. After Padcev infusion , pt was put on a post wait time of 30 min. Patient tolerated it well without problems. The rest of treatment was completed Vitals stable and discharged home from clinic ambulatory. Follow up as scheduled.

## 2024-04-18 ENCOUNTER — Telehealth (HOSPITAL_COMMUNITY): Payer: Self-pay | Admitting: *Deleted

## 2024-04-18 LAB — T4: T4, Total: 9.2 ug/dL (ref 4.5–12.0)

## 2024-04-18 NOTE — Telephone Encounter (Signed)
 Chemotherapy 24 hour call placed today, patient voiced no side effects at this time and stated he felt fine. Patient advised to call the clinic if needed and patient verbalized understanding.

## 2024-04-21 ENCOUNTER — Encounter: Payer: Self-pay | Admitting: Oncology

## 2024-04-24 ENCOUNTER — Inpatient Hospital Stay

## 2024-04-24 ENCOUNTER — Inpatient Hospital Stay: Admitting: Dietician

## 2024-04-24 VITALS — BP 130/74 | HR 91 | Temp 97.6°F | Resp 18 | Wt 156.0 lb

## 2024-04-24 DIAGNOSIS — C689 Malignant neoplasm of urinary organ, unspecified: Secondary | ICD-10-CM

## 2024-04-24 DIAGNOSIS — Z5111 Encounter for antineoplastic chemotherapy: Secondary | ICD-10-CM | POA: Diagnosis not present

## 2024-04-24 LAB — CBC WITH DIFFERENTIAL/PLATELET
Abs Immature Granulocytes: 0.04 K/uL (ref 0.00–0.07)
Basophils Absolute: 0.1 K/uL (ref 0.0–0.1)
Basophils Relative: 1 %
Eosinophils Absolute: 0.5 K/uL (ref 0.0–0.5)
Eosinophils Relative: 6 %
HCT: 34.2 % — ABNORMAL LOW (ref 39.0–52.0)
Hemoglobin: 11.4 g/dL — ABNORMAL LOW (ref 13.0–17.0)
Immature Granulocytes: 1 %
Lymphocytes Relative: 13 %
Lymphs Abs: 1 K/uL (ref 0.7–4.0)
MCH: 33.2 pg (ref 26.0–34.0)
MCHC: 33.3 g/dL (ref 30.0–36.0)
MCV: 99.7 fL (ref 80.0–100.0)
Monocytes Absolute: 0.8 K/uL (ref 0.1–1.0)
Monocytes Relative: 10 %
Neutro Abs: 6 K/uL (ref 1.7–7.7)
Neutrophils Relative %: 69 %
Platelets: 268 K/uL (ref 150–400)
RBC: 3.43 MIL/uL — ABNORMAL LOW (ref 4.22–5.81)
RDW: 17.7 % — ABNORMAL HIGH (ref 11.5–15.5)
WBC: 8.4 K/uL (ref 4.0–10.5)
nRBC: 0 % (ref 0.0–0.2)

## 2024-04-24 LAB — COMPREHENSIVE METABOLIC PANEL WITH GFR
ALT: 14 U/L (ref 0–44)
AST: 29 U/L (ref 15–41)
Albumin: 3.8 g/dL (ref 3.5–5.0)
Alkaline Phosphatase: 137 U/L — ABNORMAL HIGH (ref 38–126)
Anion gap: 13 (ref 5–15)
BUN: 16 mg/dL (ref 8–23)
CO2: 25 mmol/L (ref 22–32)
Calcium: 9.4 mg/dL (ref 8.9–10.3)
Chloride: 100 mmol/L (ref 98–111)
Creatinine, Ser: 0.89 mg/dL (ref 0.61–1.24)
GFR, Estimated: >60 mL/min (ref >60)
Glucose, Bld: 112 mg/dL — ABNORMAL HIGH (ref 70–99)
Potassium: 3.8 mmol/L (ref 3.5–5.1)
Sodium: 138 mmol/L (ref 135–145)
Total Bilirubin: 0.5 mg/dL (ref 0.0–1.2)
Total Protein: 7.8 g/dL (ref 6.5–8.1)

## 2024-04-24 LAB — MAGNESIUM: Magnesium: 2.1 mg/dL (ref 1.7–2.4)

## 2024-04-24 MED ORDER — SODIUM CHLORIDE 0.9 % IV SOLN: INTRAVENOUS | Status: DC

## 2024-04-24 MED ORDER — PROCHLORPERAZINE MALEATE 10 MG PO TABS
10.0000 mg | ORAL_TABLET | Freq: Once | ORAL | Status: AC
  Administered 2024-04-24: 10 mg via ORAL
  Filled 2024-04-24: qty 1

## 2024-04-24 MED ORDER — SODIUM CHLORIDE 0.9 % IV SOLN
1.2500 mg/kg | Freq: Once | INTRAVENOUS | Status: AC
  Administered 2024-04-24: 90 mg via INTRAVENOUS
  Filled 2024-04-24: qty 9

## 2024-04-24 NOTE — Progress Notes (Signed)
 Nutrition Assessment   Reason for Assessment: Referral - decreased appetite/wt loss    ASSESSMENT: 83 year old male with urothelial carcinoma. He is receiving padcev + keytruda q21d (first padcev 10/27). Patient is under the care of Dr. Davonna.  Past medical history includes HTN, pancreatitis due to biliary obstruction s/p stent (2022), early satiety, IDA  Me with patient and son in infusion. Patient reports decreased appetite the last few weeks. Relates this to everything going on right now. Normally, patient would go meet friends for a big breakfast, but now finds he isn't hungry or has something small. Yesterday, patient had a steak biscuit from [employer] + slice of ham for breakfast. Snacked on a nut bar and Boost HP for lunch. Recalls 3 chicken tenders and fries for dinner. Patient denies nausea, vomiting, diarrhea, constipation.    Nutrition Focused Physical Exam: deferred    Medications: norco, zofran , compazine    Labs: glucose 112, alk phos 137   Anthropometrics:   Height: 5'6 Weight: 156 lb  UBW: 160-165 lb  BMI: 24.48   NUTRITION DIAGNOSIS: Unintended wt loss related to cancer as evidenced by decreased appetite, 4.9% wt loss in 5 weeks which is concerning given advanced age undergoing chemotherapy    INTERVENTION:  Educated on importance of adequate calories and protein energy intake to maintain strength/weights during treatment Discussed strategies for poor appetite, encourage eating by the clock - within first hour of waking following by po q3h - snack ideas provided  Encourage soft moist high protein foods for ease of intake - adding gravy/sauces (soft moist high protein foods list provided) Continue drinking Boost HP/equivalent 1-2/day in between meals (sample of CIB powder provided for pt to try - he does not like taste of Ensure) Contact information    MONITORING, EVALUATION, GOAL: Pt will tolerate increased calories and protein to minimize wt loss during  treatment    Next Visit: Monday November 10 during infusion

## 2024-04-24 NOTE — Patient Instructions (Signed)
 [facility] [place] - A DEPT OF [facility]  Discharge Instructions: Thank you for choosing [facility] to provide your oncology and hematology care.  If you have a lab appointment with the Cancer Center - please note that after April 8th, 2024, all labs will be drawn in the cancer center.  You do not have to check in or register with the main entrance as you have in the past but will complete your check-in in the cancer center.  Wear comfortable clothing and clothing appropriate for easy access to any Portacath or PICC line.   We strive to give you quality time with your provider. You may need to reschedule your appointment if you arrive late (15 or more minutes).  Arriving late affects you and other patients whose appointments are after yours.  Also, if you miss three or more appointments without notifying the office, you may be dismissed from the clinic at the provider's discretion.      For prescription refill requests, have your pharmacy contact our office and allow 72 hours for refills to be completed.    Today you received the following chemotherapy and/or immunotherapy agents Padcev      To help prevent nausea and vomiting after your treatment, we encourage you to take your nausea medication as directed.  BELOW ARE SYMPTOMS THAT SHOULD BE REPORTED IMMEDIATELY: *FEVER GREATER THAN 100.4 F (38 C) OR HIGHER *CHILLS OR SWEATING *NAUSEA AND VOMITING THAT IS NOT CONTROLLED WITH YOUR NAUSEA MEDICATION *UNUSUAL SHORTNESS OF BREATH *UNUSUAL BRUISING OR BLEEDING *URINARY PROBLEMS (pain or burning when urinating, or frequent urination) *BOWEL PROBLEMS (unusual diarrhea, constipation, pain near the anus) TENDERNESS IN MOUTH AND THROAT WITH OR WITHOUT PRESENCE OF ULCERS (sore throat, sores in mouth, or a toothache) UNUSUAL RASH, SWELLING OR PAIN  UNUSUAL VAGINAL DISCHARGE OR ITCHING   Items with * indicate a potential emergency and should be followed up as  soon as possible or go to the Emergency Department if any problems should occur.  Please show the CHEMOTHERAPY ALERT CARD or IMMUNOTHERAPY ALERT CARD at check-in to the Emergency Department and triage nurse.  Should you have questions after your visit or need to cancel or reschedule your appointment, please contact [facility]CER CTR [place] - A DEPT OF [facility] [phone]  and follow the prompts.  Office hours are 8:00 a.m. to 4:30 p.m. Monday - Friday. Please note that voicemails left after 4:00 p.m. may not be returned until the following business day.  We are closed weekends and major holidays. You have access to a nurse at all times for urgent questions. Please call the main number to the clinic [phone] and follow the prompts.  For any non-urgent questions, you may also contact your provider using MyChart. We now offer e-Visits for anyone 35 and older to request care online for non-urgent symptoms. For details visit [email or web address].   Also download the MyChart app! Go to the app store, search "MyChart", open the app, select [place], and log in with your MyChart username and password.

## 2024-04-24 NOTE — Progress Notes (Signed)
Patient tolerated chemotherapy with no complaints voiced.  Side effects with management reviewed with understanding verbalized.  Peripheral IV site clean and dry with no bruising or swelling noted at site.  Good blood return noted before and after administration of chemotherapy.  Band aid applied.  Patient left in satisfactory condition with VSS and no s/s of distress noted.

## 2024-04-25 ENCOUNTER — Other Ambulatory Visit: Payer: Self-pay

## 2024-04-25 ENCOUNTER — Encounter (HOSPITAL_COMMUNITY): Payer: Self-pay | Admitting: Emergency Medicine

## 2024-04-25 ENCOUNTER — Emergency Department (HOSPITAL_COMMUNITY)
Admission: EM | Admit: 2024-04-25 | Discharge: 2024-04-25 | Disposition: A | Discharge status: Home / Self Care | Attending: Emergency Medicine | Admitting: Emergency Medicine

## 2024-04-25 DIAGNOSIS — Z8551 Personal history of malignant neoplasm of bladder: Secondary | ICD-10-CM | POA: Insufficient documentation

## 2024-04-25 DIAGNOSIS — R000 Tachycardia, unspecified: Secondary | ICD-10-CM | POA: Diagnosis not present

## 2024-04-25 DIAGNOSIS — R339 Retention of urine, unspecified: Secondary | ICD-10-CM | POA: Insufficient documentation

## 2024-04-25 HISTORY — DX: Malignant neoplasm of bladder, unspecified: C67.9

## 2024-04-25 LAB — URINALYSIS, W/ REFLEX TO CULTURE (INFECTION SUSPECTED)
Bacteria, UA: NONE SEEN
Bilirubin Urine: NEGATIVE
Glucose, UA: NEGATIVE mg/dL
Hgb urine dipstick: NEGATIVE
Ketones, ur: NEGATIVE mg/dL
Leukocytes,Ua: NEGATIVE
Nitrite: NEGATIVE
Protein, ur: NEGATIVE mg/dL
Specific Gravity, Urine: 1.013 (ref 1.005–1.030)
pH: 6 (ref 5.0–8.0)

## 2024-04-25 LAB — CBC WITH DIFFERENTIAL/PLATELET
Abs Immature Granulocytes: 0.05 K/uL (ref 0.00–0.07)
Basophils Absolute: 0.1 K/uL (ref 0.0–0.1)
Basophils Relative: 1 %
Eosinophils Absolute: 0.2 K/uL (ref 0.0–0.5)
Eosinophils Relative: 2 %
HCT: 31.7 % — ABNORMAL LOW (ref 39.0–52.0)
Hemoglobin: 10.7 g/dL — ABNORMAL LOW (ref 13.0–17.0)
Immature Granulocytes: 1 %
Lymphocytes Relative: 9 %
Lymphs Abs: 0.8 K/uL (ref 0.7–4.0)
MCH: 33.3 pg (ref 26.0–34.0)
MCHC: 33.8 g/dL (ref 30.0–36.0)
MCV: 98.8 fL (ref 80.0–100.0)
Monocytes Absolute: 1 K/uL (ref 0.1–1.0)
Monocytes Relative: 11 %
Neutro Abs: 6.9 K/uL (ref 1.7–7.7)
Neutrophils Relative %: 76 %
Platelets: 266 K/uL (ref 150–400)
RBC: 3.21 MIL/uL — ABNORMAL LOW (ref 4.22–5.81)
RDW: 16.9 % — ABNORMAL HIGH (ref 11.5–15.5)
WBC: 9 K/uL (ref 4.0–10.5)
nRBC: 0 % (ref 0.0–0.2)

## 2024-04-25 LAB — BASIC METABOLIC PANEL WITH GFR
Anion gap: 16 — ABNORMAL HIGH (ref 5–15)
BUN: 19 mg/dL (ref 8–23)
CO2: 21 mmol/L — ABNORMAL LOW (ref 22–32)
Calcium: 9.3 mg/dL (ref 8.9–10.3)
Chloride: 101 mmol/L (ref 98–111)
Creatinine, Ser: 0.86 mg/dL (ref 0.61–1.24)
GFR, Estimated: >60 mL/min (ref >60)
Glucose, Bld: 120 mg/dL — ABNORMAL HIGH (ref 70–99)
Potassium: 3.7 mmol/L (ref 3.5–5.1)
Sodium: 137 mmol/L (ref 135–145)

## 2024-04-25 NOTE — ED Notes (Signed)
 Instructions given on foley catheter care with emphasis on handwashing, daily cleaning, emptying bag at regular intervals and avoiding tugging on catheter. Patient and family verbalized understanding of instructions. Print outs given with detailed instructions.  Changed drainage bag to a leg bag.

## 2024-04-25 NOTE — ED Triage Notes (Signed)
 Pt reports urinary retention since last night with only droplets of urine; pt reports hx of bladder cancer, last treatment yesterday

## 2024-04-25 NOTE — Discharge Instructions (Addendum)
 You were seen in the emergency department for lower abdominal pain and difficulty urinating.  Your bladder was full and you are in urinary retention.  Catheter was placed with improvement in your symptoms.  Please keep the catheter until you follow-up with your oncology team and [facility].  Keep well-hydrated.  Return if any worsening or concerning symptoms

## 2024-04-25 NOTE — ED Provider Notes (Signed)
 [place] [facility]ENC[facility] DE[facility]MENT AT [place] Provider Note   CSN: [id number] Arrival date & time: 04/25/24  8094     Patient presents with: Urinary Retention   Dean Randolph is a 83 y.o. male.  He is complaining of dribbling of urine and unable to urinate since last night.  He has a history of bladder cancer and has been getting chemotherapy infusions.  Complaining of a little bit of lower abdominal pain.  No gross hematuria.  No fevers or chills.  {Add pertinent medical, surgical, social history, OB history to YEP:67052} The history is provided by the patient.  Abdominal Pain Pain location:  Suprapubic Pain quality: aching   Pain radiates to:  Does not radiate Pain severity:  Mild Onset quality:  Gradual Duration:  1 day Timing:  Intermittent Progression:  Unchanged Chronicity:  New Relieved by:  Nothing Associated symptoms: no hematuria, no nausea and no vomiting        Prior to Admission medications   Medication Sig Start Date End Date Taking? Authorizing Provider  Carboxymethylcell-Glycerin PF 0.5-0.9 % SOLN Place 2 drops into both eyes 4 (four) times daily. 04/06/24   Kandala, Hyndavi, MD  HYDROcodone -acetaminophen  (NORCO/VICODIN) 5-325 MG tablet Take 2 tablets by mouth every 6 (six) hours as needed for severe pain (pain score 7-10). 04/12/24   Kandala, Hyndavi, MD  lidocaine -prilocaine (EMLA) cream Apply to affected area once Patient not taking: Reported on 04/17/2024 04/06/24   Davonna Siad, MD  ondansetron  (ZOFRAN ) 8 MG tablet Take 1 tablet (8 mg total) by mouth every 8 (eight) hours as needed for nausea or vomiting. 04/06/24   Kandala, Hyndavi, MD  prochlorperazine (COMPAZINE) 10 MG tablet Take 1 tablet (10 mg total) by mouth every 6 (six) hours as needed for nausea or vomiting. 04/06/24   Kandala, Hyndavi, MD    Allergies: Patient has no known allergies.    Review of Systems  Gastrointestinal:  Positive for abdominal pain. Negative for nausea and  vomiting.  Genitourinary:  Negative for hematuria.    Updated Vital Signs BP (!) 156/115   Pulse (!) 116   Temp 97.9 F (36.6 C) (Oral)   Resp 20   Ht 5' 6.5 (1.689 m)   Wt 70.8 kg   SpO2 96%   BMI 24.80 kg/m   Physical Exam Vitals and nursing note reviewed.  Constitutional:      Appearance: Normal appearance. He is well-developed.  HENT:     Head: Normocephalic and atraumatic.  Eyes:     Conjunctiva/sclera: Conjunctivae normal.  Cardiovascular:     Rate and Rhythm: Regular rhythm. Tachycardia present.     Heart sounds: No murmur heard. Pulmonary:     Effort: Pulmonary effort is normal. No respiratory distress.     Breath sounds: Normal breath sounds.  Abdominal:     Palpations: Abdomen is soft.     Tenderness: There is abdominal tenderness (mild suprapubic). There is no guarding or rebound.  Musculoskeletal:     Cervical back: Neck supple.  Skin:    General: Skin is warm and dry.  Neurological:     General: No focal deficit present.     Mental Status: He is alert.     GCS: GCS eye subscore is 4. GCS verbal subscore is 5. GCS motor subscore is 6.     (all labs ordered are listed, but only abnormal results are displayed) Labs Reviewed  BASIC METABOLIC PANEL WITH GFR  CBC WITH DIFFERENTIAL/PLATELET  URINALYSIS, W/ REFLEX TO CULTURE (  INFECTION SUSPECTED)    EKG: None  Radiology: No results found.  {Document cardiac monitor, telemetry assessment procedure when appropriate:32947} Procedures   Medications Ordered in the ED - No data to display    {Click here for ABCD2, HEART and other calculators REFRESH Note before signing:1}                              Medical Decision Making Amount and/or Complexity of Data Reviewed Labs: ordered.   This patient complains of ***; this involves an extensive number of treatment Options and is a complaint that carries with it a high risk of complications and morbidity. The differential includes ***  I ordered,  reviewed and interpreted labs, which included *** I ordered medication *** and reviewed PMP when indicated. I ordered imaging studies which included *** and I independently    visualized and interpreted imaging which showed *** Additional history obtained from *** Previous records obtained and reviewed *** I consulted *** and discussed lab and imaging findings and discussed disposition.  Cardiac monitoring reviewed, *** Social determinants considered, *** Critical Interventions: ***  After the interventions stated above, I reevaluated the patient and found *** Admission and further testing considered, ***   {Document critical care time when appropriate  Document review of labs and clinical decision tools ie CHADS2VASC2, etc  Document your independent review of radiology images and any outside records  Document your discussion with family members, caretakers and with consultants  Document social determinants of health affecting pt's care  Document your decision making why or why not admission, treatments were needed:32947:::1}   Final diagnoses:  None    ED Discharge Orders     None

## 2024-04-28 ENCOUNTER — Other Ambulatory Visit: Payer: Self-pay

## 2024-04-28 ENCOUNTER — Other Ambulatory Visit: Payer: Self-pay | Admitting: *Deleted

## 2024-04-28 NOTE — Progress Notes (Signed)
 I spoke with patient via telephone.  He advised the he left a VM about his medicine but he has spoke to his pharmacy and they have his medicine ready for pickup.  He does not need anything further at this time.

## 2024-05-01 ENCOUNTER — Inpatient Hospital Stay (HOSPITAL_COMMUNITY)
Admission: EM | Admit: 2024-05-01 | Discharge: 2024-05-04 | DRG: 871 | Disposition: A | Discharge status: Home Health | Source: Ambulatory Visit | Attending: Internal Medicine | Admitting: Internal Medicine

## 2024-05-01 ENCOUNTER — Emergency Department (HOSPITAL_COMMUNITY)

## 2024-05-01 ENCOUNTER — Other Ambulatory Visit: Payer: Self-pay

## 2024-05-01 ENCOUNTER — Encounter (HOSPITAL_COMMUNITY): Payer: Self-pay

## 2024-05-01 ENCOUNTER — Telehealth: Payer: Self-pay | Admitting: *Deleted

## 2024-05-01 DIAGNOSIS — Z9841 Cataract extraction status, right eye: Secondary | ICD-10-CM | POA: Diagnosis not present

## 2024-05-01 DIAGNOSIS — E876 Hypokalemia: Secondary | ICD-10-CM | POA: Diagnosis present

## 2024-05-01 DIAGNOSIS — D509 Iron deficiency anemia, unspecified: Secondary | ICD-10-CM | POA: Diagnosis present

## 2024-05-01 DIAGNOSIS — Z9049 Acquired absence of other specified parts of digestive tract: Secondary | ICD-10-CM | POA: Diagnosis not present

## 2024-05-01 DIAGNOSIS — Z79899 Other long term (current) drug therapy: Secondary | ICD-10-CM

## 2024-05-01 DIAGNOSIS — C679 Malignant neoplasm of bladder, unspecified: Secondary | ICD-10-CM | POA: Diagnosis present

## 2024-05-01 DIAGNOSIS — N390 Urinary tract infection, site not specified: Secondary | ICD-10-CM | POA: Diagnosis present

## 2024-05-01 DIAGNOSIS — Y846 Urinary catheterization as the cause of abnormal reaction of the patient, or of later complication, without mention of misadventure at the time of the procedure: Secondary | ICD-10-CM | POA: Diagnosis present

## 2024-05-01 DIAGNOSIS — R531 Weakness: Secondary | ICD-10-CM | POA: Diagnosis present

## 2024-05-01 DIAGNOSIS — A419 Sepsis, unspecified organism: Principal | ICD-10-CM | POA: Diagnosis present

## 2024-05-01 DIAGNOSIS — N3001 Acute cystitis with hematuria: Secondary | ICD-10-CM

## 2024-05-01 DIAGNOSIS — E43 Unspecified severe protein-calorie malnutrition: Secondary | ICD-10-CM | POA: Diagnosis present

## 2024-05-01 DIAGNOSIS — Z87442 Personal history of urinary calculi: Secondary | ICD-10-CM | POA: Diagnosis not present

## 2024-05-01 DIAGNOSIS — J189 Pneumonia, unspecified organism: Principal | ICD-10-CM | POA: Diagnosis present

## 2024-05-01 DIAGNOSIS — I10 Essential (primary) hypertension: Secondary | ICD-10-CM | POA: Diagnosis present

## 2024-05-01 DIAGNOSIS — R590 Localized enlarged lymph nodes: Secondary | ICD-10-CM | POA: Diagnosis present

## 2024-05-01 DIAGNOSIS — Z9842 Cataract extraction status, left eye: Secondary | ICD-10-CM | POA: Diagnosis not present

## 2024-05-01 DIAGNOSIS — Z1152 Encounter for screening for COVID-19: Secondary | ICD-10-CM

## 2024-05-01 DIAGNOSIS — T83518A Infection and inflammatory reaction due to other urinary catheter, initial encounter: Secondary | ICD-10-CM | POA: Diagnosis present

## 2024-05-01 DIAGNOSIS — C7951 Secondary malignant neoplasm of bone: Secondary | ICD-10-CM | POA: Diagnosis present

## 2024-05-01 DIAGNOSIS — Z6824 Body mass index (BMI) 24.0-24.9, adult: Secondary | ICD-10-CM | POA: Diagnosis not present

## 2024-05-01 LAB — RESP PANEL BY RT-PCR (RSV, FLU A&B, COVID)  RVPGX2
Influenza A by PCR: NEGATIVE
Influenza B by PCR: NEGATIVE
Resp Syncytial Virus by PCR: NEGATIVE
SARS Coronavirus 2 by RT PCR: NEGATIVE

## 2024-05-01 LAB — CBC
HCT: 30.6 % — ABNORMAL LOW (ref 39.0–52.0)
Hemoglobin: 10.7 g/dL — ABNORMAL LOW (ref 13.0–17.0)
MCH: 34.9 pg — ABNORMAL HIGH (ref 26.0–34.0)
MCHC: 35 g/dL (ref 30.0–36.0)
MCV: 99.7 fL (ref 80.0–100.0)
Platelets: 307 K/uL (ref 150–400)
RBC: 3.07 MIL/uL — ABNORMAL LOW (ref 4.22–5.81)
RDW: 19.3 % — ABNORMAL HIGH (ref 11.5–15.5)
WBC: 13.2 K/uL — ABNORMAL HIGH (ref 4.0–10.5)
nRBC: 0 % (ref 0.0–0.2)

## 2024-05-01 LAB — LACTIC ACID, PLASMA
Lactic Acid, Venous: 3.5 mmol/L (ref 0.5–1.9)
Lactic Acid, Venous: 3.7 mmol/L (ref 0.5–1.9)
Lactic Acid, Venous: 4 mmol/L (ref 0.5–1.9)
Lactic Acid, Venous: 1.8 mmol/L (ref 0.5–1.9)

## 2024-05-01 LAB — URINALYSIS, ROUTINE W REFLEX MICROSCOPIC
Bilirubin Urine: NEGATIVE
Glucose, UA: NEGATIVE mg/dL
Ketones, ur: 5 mg/dL — AB
Nitrite: NEGATIVE
Protein, ur: 100 mg/dL — AB
RBC / HPF: >50 RBC/hpf (ref 0–5)
Specific Gravity, Urine: 1.029 (ref 1.005–1.030)
WBC, UA: >50 WBC/hpf (ref 0–5)
pH: 5 (ref 5.0–8.0)

## 2024-05-01 LAB — COMPREHENSIVE METABOLIC PANEL WITH GFR
ALT: 42 U/L (ref 0–44)
AST: 47 U/L — ABNORMAL HIGH (ref 15–41)
Albumin: 3.7 g/dL (ref 3.5–5.0)
Alkaline Phosphatase: 155 U/L — ABNORMAL HIGH (ref 38–126)
Anion gap: 16 — ABNORMAL HIGH (ref 5–15)
BUN: 23 mg/dL (ref 8–23)
CO2: 21 mmol/L — ABNORMAL LOW (ref 22–32)
Calcium: 9.2 mg/dL (ref 8.9–10.3)
Chloride: 98 mmol/L (ref 98–111)
Creatinine, Ser: 1.19 mg/dL (ref 0.61–1.24)
GFR, Estimated: >60 mL/min (ref >60)
Glucose, Bld: 176 mg/dL — ABNORMAL HIGH (ref 70–99)
Potassium: 3.1 mmol/L — ABNORMAL LOW (ref 3.5–5.1)
Sodium: 135 mmol/L (ref 135–145)
Total Bilirubin: 0.6 mg/dL (ref 0.0–1.2)
Total Protein: 7.3 g/dL (ref 6.5–8.1)

## 2024-05-01 LAB — CBG MONITORING, ED: Glucose-Capillary: 180 mg/dL — ABNORMAL HIGH (ref 70–99)

## 2024-05-01 MED ORDER — ENOXAPARIN SODIUM 40 MG/0.4ML IJ SOSY
40.0000 mg | PREFILLED_SYRINGE | INTRAMUSCULAR | Status: DC
  Administered 2024-05-01 – 2024-05-04 (×4): 40 mg via SUBCUTANEOUS
  Filled 2024-05-01 (×4): qty 0.4

## 2024-05-01 MED ORDER — ONDANSETRON HCL 4 MG/2ML IJ SOLN
4.0000 mg | Freq: Four times a day (QID) | INTRAMUSCULAR | Status: DC | PRN
  Administered 2024-05-03 (×2): 4 mg via INTRAVENOUS
  Filled 2024-05-01 (×2): qty 2

## 2024-05-01 MED ORDER — ENSURE PLUS HIGH PROTEIN PO LIQD
237.0000 mL | Freq: Two times a day (BID) | ORAL | Status: DC
  Administered 2024-05-02 (×2): 237 mL via ORAL

## 2024-05-01 MED ORDER — ACETAMINOPHEN 650 MG RE SUPP: 650.0000 mg | Freq: Four times a day (QID) | RECTAL | Status: DC | PRN

## 2024-05-01 MED ORDER — SODIUM CHLORIDE 0.9 % IV BOLUS
1000.0000 mL | Freq: Once | INTRAVENOUS | Status: AC
  Administered 2024-05-01: 1000 mL via INTRAVENOUS

## 2024-05-01 MED ORDER — CEFTRIAXONE SODIUM 1 G IJ SOLR
1.0000 g | INTRAMUSCULAR | Status: DC
  Administered 2024-05-02 – 2024-05-03 (×2): 1 g via INTRAVENOUS
  Filled 2024-05-01 (×3): qty 10

## 2024-05-01 MED ORDER — SENNOSIDES-DOCUSATE SODIUM 8.6-50 MG PO TABS
1.0000 | ORAL_TABLET | Freq: Every evening | ORAL | Status: DC | PRN
Start: 2024-05-01 — End: 2024-05-04

## 2024-05-01 MED ORDER — SODIUM CHLORIDE 0.9 % IV SOLN
500.0000 mg | Freq: Once | INTRAVENOUS | Status: AC
  Administered 2024-05-01: 500 mg via INTRAVENOUS
  Filled 2024-05-01: qty 5

## 2024-05-01 MED ORDER — SODIUM CHLORIDE 0.9 % IV SOLN
1.0000 g | Freq: Once | INTRAVENOUS | Status: AC
  Administered 2024-05-01: 1 g via INTRAVENOUS
  Filled 2024-05-01: qty 10

## 2024-05-01 MED ORDER — POTASSIUM CHLORIDE CRYS ER 20 MEQ PO TBCR
40.0000 meq | EXTENDED_RELEASE_TABLET | Freq: Once | ORAL | Status: AC
  Administered 2024-05-01: 40 meq via ORAL
  Filled 2024-05-01: qty 2

## 2024-05-01 MED ORDER — HYDROMORPHONE HCL 1 MG/ML IJ SOLN: 0.5000 mg | INTRAMUSCULAR | Status: DC | PRN

## 2024-05-01 MED ORDER — ONDANSETRON HCL 4 MG PO TABS
4.0000 mg | ORAL_TABLET | Freq: Four times a day (QID) | ORAL | Status: DC | PRN
  Administered 2024-05-02: 4 mg via ORAL
  Filled 2024-05-01: qty 1

## 2024-05-01 MED ORDER — POTASSIUM CHLORIDE 2 MEQ/ML IV SOLN
INTRAVENOUS | Status: AC
  Filled 2024-05-01 (×2): qty 1000

## 2024-05-01 MED ORDER — ALBUTEROL SULFATE (2.5 MG/3ML) 0.083% IN NEBU: 2.5000 mg | INHALATION_SOLUTION | RESPIRATORY_TRACT | Status: DC | PRN

## 2024-05-01 MED ORDER — ACETAMINOPHEN 325 MG PO TABS: 650.0000 mg | ORAL_TABLET | Freq: Four times a day (QID) | ORAL | Status: DC | PRN

## 2024-05-01 MED ORDER — SODIUM CHLORIDE 0.9 % IV SOLN
500.0000 mg | INTRAVENOUS | Status: DC
  Administered 2024-05-02 – 2024-05-04 (×3): 500 mg via INTRAVENOUS
  Filled 2024-05-01 (×3): qty 5

## 2024-05-01 MED ORDER — CHLORHEXIDINE GLUCONATE CLOTH 2 % EX PADS
6.0000 | MEDICATED_PAD | Freq: Every day | CUTANEOUS | Status: DC
  Administered 2024-05-01 – 2024-05-04 (×4): 6 via TOPICAL

## 2024-05-01 MED ORDER — HYDROCODONE-ACETAMINOPHEN 5-325 MG PO TABS
1.0000 | ORAL_TABLET | ORAL | Status: DC | PRN
  Administered 2024-05-01 – 2024-05-03 (×6): 1 via ORAL
  Administered 2024-05-04: 2 via ORAL
  Filled 2024-05-01 (×4): qty 1
  Filled 2024-05-01: qty 2
  Filled 2024-05-01 (×2): qty 1

## 2024-05-01 NOTE — Sepsis Progress Note (Signed)
 Notified provider and bedside nurse of need to order and draw repeat lactic acid #3 at 1835.

## 2024-05-01 NOTE — Plan of Care (Signed)

## 2024-05-01 NOTE — Sepsis Progress Note (Signed)
 Elink monitoring for the code sepsis protocol.

## 2024-05-01 NOTE — H&P (Addendum)
 History and Physical    QAMAR AUGHENBAUGH FMW:[id number] DOB: 12-Jul-1940 DOA: 05/01/2024  PCP: Pcp, No   Patient coming from: Home  I have personally briefly reviewed patient's old medical records in [facility]  Chief Complaint: Generalized weakness, cough, dyspnea  HPI: Dean Randolph is a 83 y.o. male with medical history significant of urothelial carcinoma on chemotherapy, kidney stones presents to the ER with complaints of generalized weakness, cough with sputum, dyspnea.  Patient has not been feeling well for the past couple of days.  He was diagnosed with urothelial carcinoma per retroperitoneal lymph node biopsy at the end of September 2025.  PET scan was completed and is found to have metastatic lesions in his spine as well as multiple lung nodules as well as thoracic and abdominal lymphadenopathy.  He sees oncology and was initiated on therapy.  He did receive infusion of Enfortumab Vedotin-EjFv on 04/24/2024.  He had issues with urinary dribbling and difficulty urination and had to come to the ER on 04/25/2024 where Foley catheter was placed.  UA was negative at that time.    Over the past few days he has not been feeling well.  He reports of cough productive of sputum, shortness of breath.  He feels very weak and has difficulty moving around.  He also feels chills.  He also reports of some diarrhea.  He however has not had any bowel movements today.  No definite fever.  Reports nausea but no vomiting.  Denies any abdominal pain.  In the ER he was tachycardic with heart rate in 130s, white count elevated as well.  Chest x-ray reveals patchy opacity in the right upper lung suspicious for pneumonia versus pulmonary nodule.  UA is concerning for UTI.  Patient received IV hydration with improvement in tachycardia.  Also received IV ceftriaxone  and azithromycin and will be admitted for further management.   Review of Systems: As per HPI otherwise all other systems were reviewed and are  negative.   Past Medical History:  Diagnosis Date   Bladder cancer (HCC)    Hypertension     Past Surgical History:  Procedure Laterality Date   BILIARY DILATION  03/12/2022   Procedure: BILIARY DILATION;  Surgeon: Wilhelmenia Aloha Raddle., MD;  Location: [facility];  Service: Gastroenterology;;   BILIARY STENT PLACEMENT N/A 02/06/2021   Procedure: BILIARY STENT PLACEMENT;  Surgeon: Golda Claudis PENNER, MD;  Location: [facility];  Service: Gastroenterology;  Laterality: N/A;   BILIARY STENT PLACEMENT N/A 03/26/2021   Procedure: BILIARY STENT PLACEMENT 10 FRENCH BY 9cm;  Surgeon: Golda Claudis PENNER, MD;  Location: [facility];  Service: Endoscopy;  Laterality: N/A;   BILIARY STENT PLACEMENT N/A 01/19/2022   Procedure: BILIARY STENT PLACEMENT;  Surgeon: Wilhelmenia Aloha Raddle., MD;  Location: [facility];  Service: Gastroenterology;  Laterality: N/A;   BIOPSY  01/19/2022   Procedure: BIOPSY;  Surgeon: Wilhelmenia Aloha Raddle., MD;  Location: [facility];  Service: Gastroenterology;;   CATARACT EXTRACTION Bilateral    CHOLECYSTECTOMY N/A 04/25/2021   Procedure: LAPAROSCOPIC CHOLECYSTECTOMY;  Surgeon: Mavis Anes, MD;  Location: [facility];  Service: General;  Laterality: N/A;   ENDOSCOPIC RETROGRADE CHOLANGIOPANCREATOGRAPHY (ERCP) WITH PROPOFOL  N/A 01/19/2022   Procedure: ENDOSCOPIC RETROGRADE CHOLANGIOPANCREATOGRAPHY (ERCP) WITH PROPOFOL ;  Surgeon: Wilhelmenia Aloha Raddle., MD;  Location: [facility];  Service: Gastroenterology;  Laterality: N/A;   ENDOSCOPIC RETROGRADE CHOLANGIOPANCREATOGRAPHY (ERCP) WITH PROPOFOL  N/A 03/12/2022   Procedure: ENDOSCOPIC RETROGRADE CHOLANGIOPANCREATOGRAPHY (ERCP) WITH PROPOFOL ;  Surgeon: Wilhelmenia Aloha Raddle., MD;  Location: [facility];  Service: Gastroenterology;  Laterality: N/A;   ERCP N/A 02/06/2021   Procedure: ENDOSCOPIC RETROGRADE CHOLANGIOPANCREATOGRAPHY (ERCP);  Surgeon: Golda Claudis PENNER, MD;  Location: [facility];  Service: Gastroenterology;  Laterality: N/A;   ERCP  N/A 03/26/2021   Procedure: ENDOSCOPIC RETROGRADE CHOLANGIOPANCREATOGRAPHY (ERCP) with EXTENDED SPHINCTEROTOMY;  Surgeon: Golda Claudis PENNER, MD;  [facility];  Service: Endoscopy;  Laterality: N/A;   ERCP N/A 10/27/2021   Procedure: ENDOSCOPIC RETROGRADE CHOLANGIOPANCREATOGRAPHY (ERCP);  Surgeon: Golda Claudis PENNER, MD;  Location: [facility];  Service: Endoscopy;  Laterality: N/A;  no site    GASTROINTESTINAL STENT REMOVAL N/A 03/26/2021   Procedure: GASTROINTESTINAL STENT REMOVAL;  Surgeon: Golda Claudis PENNER, MD;  Location: [facility];  Service: Endoscopy;  Laterality: N/A;   GASTROINTESTINAL STENT REMOVAL N/A 10/27/2021   Procedure: GASTROINTESTINAL STENT EXCHANGE;  Surgeon: Golda Claudis PENNER, MD;  Location: [facility];  Service: Endoscopy;  Laterality: N/A;  no site    REMOVAL OF STONES N/A 03/26/2021   Procedure: REMOVAL OF STONES WITH EXTRACTION BASKET;  Surgeon: Golda Claudis PENNER, MD;  [facility];  Service: Endoscopy;  Laterality: N/A;   REMOVAL OF STONES N/A 10/27/2021   Procedure: REMOVAL OF STONES;  Surgeon: Golda Claudis PENNER, MD;  [facility];  Service: Endoscopy;  Laterality: N/A;   REMOVAL OF STONES  01/19/2022   Procedure: REMOVAL OF STONES;  Surgeon: Wilhelmenia Aloha Raddle., MD;  Location: [facility];  Service: Gastroenterology;;   REMOVAL OF STONES  03/12/2022   Procedure: REMOVAL OF STONES;  Surgeon: Wilhelmenia Aloha Raddle., MD;  Location: [facility];  Service: Gastroenterology;;   ANNETT N/A 02/06/2021   Procedure: ANNETT;  Surgeon: Golda Claudis PENNER, MD;  [facility];  Service: Gastroenterology;  Laterality: N/A;   SPHINCTEROTOMY N/A 10/27/2021   Procedure: SPHINCTEROTOMY;  Surgeon: Golda Claudis PENNER, MD;  Location: [facility];  Service: Endoscopy;  Laterality: N/A;   SPYGLASS CHOLANGIOSCOPY N/A 01/19/2022   Procedure: SPYGLASS CHOLANGIOSCOPY;  Surgeon: Wilhelmenia Aloha Raddle., MD;  Location: [facility];  Service: Gastroenterology;  Laterality: N/A;   SPYGLASS  CHOLANGIOSCOPY N/A 03/12/2022   Procedure: DEBHOJDD CHOLANGIOSCOPY;  Surgeon: Wilhelmenia Aloha Raddle., MD;  Location: [facility];  Service: Gastroenterology;  Laterality: N/A;   SPYGLASS LITHOTRIPSY N/A 03/12/2022   Procedure: DEBHOJDD LITHOTRIPSY;  Surgeon: Wilhelmenia Aloha Raddle., MD;  Location: [facility];  Service: Gastroenterology;  Laterality: N/A;   STENT REMOVAL  01/19/2022   Procedure: STENT REMOVAL;  Surgeon: Wilhelmenia Aloha Raddle., MD;  Location: [facility];  Service: Gastroenterology;;   CLEDA REMOVAL  03/12/2022   Procedure: STENT REMOVAL;  Surgeon: Wilhelmenia Aloha Raddle., MD;  Location: [facility];  Service: Gastroenterology;;   STONE EXTRACTION WITH BASKET  01/19/2022   Procedure: STONE EXTRACTION WITH BASKET;  Surgeon: Wilhelmenia Aloha Raddle., MD;  Location: [facility];  Service: Gastroenterology;;     reports that he has never smoked. He has been exposed to tobacco smoke. He has never used smokeless tobacco. He reports that he does not drink alcohol and does not use drugs.  No Known Allergies  Family History  Problem Relation Age of Onset   Stroke Mother    Colon cancer Neg Hx    Esophageal cancer Neg Hx    Inflammatory bowel disease Neg Hx    Liver disease Neg Hx    Pancreatic cancer Neg Hx    Rectal cancer Neg Hx    Stomach cancer Neg Hx     Prior to Admission medications   Medication Sig Start Date End  Date Taking? Authorizing Provider  Carboxymethylcell-Glycerin PF 0.5-0.9 % SOLN Place 2 drops into both eyes 4 (four) times daily. 04/06/24   Kandala, Hyndavi, MD  HYDROcodone -acetaminophen  (NORCO/VICODIN) 5-325 MG tablet Take 2 tablets by mouth every 6 (six) hours as needed for severe pain (pain score 7-10). 04/12/24   Davonna Siad, MD  lidocaine -prilocaine (EMLA) cream Apply to affected area once Patient not taking: Reported on 04/17/2024 04/06/24   Kandala, Hyndavi, MD  ondansetron  (ZOFRAN ) 8 MG tablet Take 1 tablet (8 mg total) by mouth every 8 (eight)  hours as needed for nausea or vomiting. 04/06/24   Kandala, Hyndavi, MD  prochlorperazine (COMPAZINE) 10 MG tablet Take 1 tablet (10 mg total) by mouth every 6 (six) hours as needed for nausea or vomiting. 04/06/24   Davonna Siad, MD    Physical Exam: Vitals:   05/01/24 1245 05/01/24 1300 05/01/24 1315 05/01/24 1400  BP:  124/70  124/70  Pulse: (!) 108 (!) 109 (!) 103 (!) 107  Resp: (!) 30 (!) 28 (!) 21 (!) 27  Temp:      TempSrc:      SpO2: 95% 96% 96% 96%  Weight:      Height:        Constitutional: NAD, calm, comfortable, chronically ill HEENT: Dry oral mucosa Chest: Clear to auscultation bilaterally, no wheezing or crackles CVS: S1, S2, no murmur, regular rhythm, tachycardia Abdomen: Soft, mildly distended, nontender, bowel sounds present Extremities: No edema  Vitals:   05/01/24 1245 05/01/24 1300 05/01/24 1315 05/01/24 1400  BP:  124/70  124/70  Pulse: (!) 108 (!) 109 (!) 103 (!) 107  Resp: (!) 30 (!) 28 (!) 21 (!) 27  Temp:      TempSrc:      SpO2: 95% 96% 96% 96%  Weight:      Height:         Labs on Admission: I have personally reviewed following labs and imaging studies  CBC: Recent Labs  Lab 04/25/24 2018 05/01/24 1117  WBC 9.0 13.2*  NEUTROABS 6.9  --   HGB 10.7* 10.7*  HCT 31.7* 30.6*  MCV 98.8 99.7  PLT 266 307   Basic Metabolic Panel: Recent Labs  Lab 04/25/24 2018 05/01/24 1117  NA 137 135  K 3.7 3.1*  CL 101 98  CO2 21* 21*  GLUCOSE 120* 176*  BUN 19 23  CREATININE 0.86 1.19  CALCIUM 9.3 9.2   GFR: Estimated Creatinine Clearance: 44.8 mL/min (by C-G formula based on SCr of 1.19 mg/dL). Liver Function Tests: Recent Labs  Lab 05/01/24 1117  AST 47*  ALT 42  ALKPHOS 155*  BILITOT 0.6  PROT 7.3  ALBUMIN 3.7   No results for input(s): LIPASE, AMYLASE in the last 168 hours. No results for input(s): AMMONIA in the last 168 hours. Coagulation Profile: No results for input(s): INR, PROTIME in the last 168  hours. Cardiac Enzymes: No results for input(s): CKTOTAL, CKMB, CKMBINDEX, TROPONINI in the last 168 hours. BNP (last 3 results) No results for input(s): PROBNP in the last 8760 hours. HbA1C: No results for input(s): HGBA1C in the last 72 hours. CBG: Recent Labs  Lab 05/01/24 1137  GLUCAP 180*   Lipid Profile: No results for input(s): CHOL, HDL, LDLCALC, TRIG, CHOLHDL, LDLDIRECT in the last 72 hours. Thyroid Function Tests: No results for input(s): TSH, T4TOTAL, FREET4, T3FREE, THYROIDAB in the last 72 hours. Anemia Panel: No results for input(s): VITAMINB12, FOLATE, FERRITIN, TIBC, IRON, RETICCTPCT in the last 72 hours. Urine  analysis:    Component Value Date/Time   COLORURINE AMBER (A) 05/01/2024 1311   APPEARANCEUR CLOUDY (A) 05/01/2024 1311   LABSPEC 1.029 05/01/2024 1311   PHURINE 5.0 05/01/2024 1311   GLUCOSEU NEGATIVE 05/01/2024 1311   HGBUR LARGE (A) 05/01/2024 1311   BILIRUBINUR NEGATIVE 05/01/2024 1311   KETONESUR 5 (A) 05/01/2024 1311   PROTEINUR 100 (A) 05/01/2024 1311   NITRITE NEGATIVE 05/01/2024 1311   LEUKOCYTESUR MODERATE (A) 05/01/2024 1311    Radiological Exams on Admission: DG Chest Portable 1 View Result Date: 05/01/2024 EXAM: 1 VIEW(S) XRAY OF THE CHEST 05/01/2024 11:44:12 AM COMPARISON: PET CT 03/30/2024 and CT chest 03/19/2024 CLINICAL HISTORY: productive cough FINDINGS: LUNGS AND PLEURA: Bilateral interstitial prominence. Patchy airspace opacity in right upper lung, possibly representing pneumonia or pulmonary nodule. Bronchitic changes. No pulmonary edema. No pleural effusion. No pneumothorax. HEART AND MEDIASTINUM: Aortic atherosclerosis. No acute abnormality of the cardiac and mediastinal silhouettes. BONES AND SOFT TISSUES: Diffuse osteopenia. IMPRESSION: 1. Patchy airspace opacity in the right upper lung, suspicious for pneumonia versus pulmonary nodule (in the setting of known metastatic renal cell  carcinoma). Electronically signed by: Waddell Calk MD 05/01/2024 01:20 PM EST RP Workstation: [id number]    EKG: Independently reviewed.  Sinus tachycardia, RBBB  Assessment/Plan  Sepsis secondary to urinary tract infection versus pneumonia.  - Will continue Rocephin , azithromycin to cover for pneumonia as well as UTI. Follow-up blood culture, urine culture - Lactic acid pending Will continue IV fluid Addendum: Lactic acid 3.5, will recheck.  Continue fluids  Weakness: Secondary to above, diarrhea, malignancy Will have PT/OT  Urinary retention: Foley catheter was placed 04/24/2024 for urinary retention.  Will continue it for now.  Patient has follow-up appointment with urology.  Urothelial carcinoma stage IV - Started on targeted therapy, continue outpatient follow-up.   Hypokalemia - Replace as needed  Iron deficiency anemia - Hemoglobin is stable at 10.5.  Continue outpatient follow-up  Hypertension - Blood pressure stable.  Hold antihypertensives in the setting of sepsis  Diarrhea: No bowel movements today, will monitor  DVT prophylaxis: Lovenox for DVT prophylaxis Code Status: Full code Family Communication:  son and daughter at the bedside Disposition Plan: home  Consults called:  Admission status: inpatient   Severity of Illness: The appropriate patient status for this patient is INPATIENT. Inpatient status is judged to be reasonable and necessary in order to provide the required intensity of service to ensure the patient's safety. The patient's presenting symptoms, physical exam findings, and initial radiographic and laboratory data in the context of their chronic comorbidities is felt to place them at high risk for further clinical deterioration. Furthermore, it is not anticipated that the patient will be medically stable for discharge from the hospital within 2 midnights of admission.   * I certify that at the point of admission it is my clinical judgment that  the patient will require inpatient hospital care spanning beyond 2 midnights from the point of admission due to high intensity of service, high risk for further deterioration and high frequency of surveillance required.DEWAINE Derryl Duval MD [facility]  05/01/2024, 2:05 PM

## 2024-05-01 NOTE — ED Notes (Signed)
 Both sets of blood cultures were done prior to IV antibiotics

## 2024-05-01 NOTE — Telephone Encounter (Signed)
 Patient called stating that he has had a green, productive cough since Saturday, associated with chills and feeling very warm at times. Has not been taking temp.  Weak and not eating well and has diarrhea despite imodium.  Last had treatment on 10/27 and immunotherapy on the 20 th.  Has not been checking temp.  Attempted on the phone but was not able to get thermometer to work.  Advised to go to the ER for evaluation.  Delon Hope, NP aware.  Patient verbalized understanding.

## 2024-05-01 NOTE — Sepsis Progress Note (Signed)
 Asking bedside RN to check on lactic acid  drawn @ 1413

## 2024-05-01 NOTE — Sepsis Progress Note (Signed)
 Notified floor RN that repeat lactic acid was due now, pt on sepsis protocol until 2000

## 2024-05-01 NOTE — ED Provider Notes (Signed)
 [place] EMERGENCY DEPARTMENT AT [facility] Provider Note  CSN: [id number] Arrival date & time: 05/01/24 1034  Chief Complaint(s) Weakness  HPI Dean Randolph is a 83 y.o. male with past medical history as below, significant for renal cell carcinoma (urothelial carcinoma), bladder cancer, hypertension, pancreatitis, IDA who presents to the ED with complaint of cough, dyspnea, fatigue  Symptoms started 2 to 3 days ago.  Exertional dyspnea, productive cough with green sputum.  Poor appetite, diarrhea.  No BRBPR or melena.  Nausea without vomiting.  Reduced urine output, poor p.o. intake.  Subjective fevers, body aches.  No sick contacts or recent travel.  He recently started treatment for bladder/renal cancer Dr Davonna -  [facility]  and [facility]     Past Medical History Past Medical History:  Diagnosis Date   Bladder cancer [facility])    Hypertension    Patient Active Problem List   Diagnosis Date Noted   Urothelial carcinoma (HCC) 04/04/2024   Renal mass, left 03/23/2024   Back pain 03/23/2024   Elevated alkaline phosphatase level 04/23/2022   History of ERCP 04/23/2022   Colon cancer screening 04/23/2022   Calculus of gallbladder without cholecystitis without obstruction    Acute cholangitis (HCC) 02/09/2021   Pancreatitis due to biliary obstruction/S/p ERCP with stent Placement on 02/06/21 02/09/2021   Essential hypertension 02/07/2021   Cholangitis (HCC)    Choledocholithiasis/Cholelithiasis--S/p ERCP with Plastic Stent Placement on 02/06/21 02/04/2021   Hyperglycemia 02/04/2021   Intermittent abdominal pain 02/04/2021   Early satiety 02/04/2021   Abnormal weight loss 02/04/2021   Hyperbilirubinemia 02/04/2021   Elevated liver enzymes 02/04/2021   Hematemesis 02/04/2021   Iron deficiency anemia    Transaminitis    Home Medication(s) Prior to Admission medications   Medication Sig Start Date End Date Taking? Authorizing Provider  Carboxymethylcell-Glycerin PF  0.5-0.9 % SOLN Place 2 drops into both eyes 4 (four) times daily. 04/06/24   Kandala, Hyndavi, MD  HYDROcodone -acetaminophen  (NORCO/VICODIN) 5-325 MG tablet Take 2 tablets by mouth every 6 (six) hours as needed for severe pain (pain score 7-10). 04/12/24   Davonna Siad, MD  lidocaine -prilocaine (EMLA) cream Apply to affected area once Patient not taking: Reported on 04/17/2024 04/06/24   Kandala, Hyndavi, MD  ondansetron  (ZOFRAN ) 8 MG tablet Take 1 tablet (8 mg total) by mouth every 8 (eight) hours as needed for nausea or vomiting. 04/06/24   Kandala, Hyndavi, MD  prochlorperazine (COMPAZINE) 10 MG tablet Take 1 tablet (10 mg total) by mouth every 6 (six) hours as needed for nausea or vomiting. 04/06/24   Davonna Siad, MD                                                                                                                                    Past Surgical History Past Surgical History:  Procedure Laterality Date   BILIARY DILATION  03/12/2022   Procedure: BILIARY DILATION;  Surgeon: Wilhelmenia Roers  Mickey., MD;  Location: [facility];  Service: Gastroenterology;;   BILIARY STENT PLACEMENT N/A 02/06/2021   Procedure: BILIARY STENT PLACEMENT;  Surgeon: Golda Claudis PENNER, MD;  [facility]: [facility];  Service: Gastroenterology;  Laterality: N/A;   BILIARY STENT PLACEMENT N/A 03/26/2021   Procedure: BILIARY STENT PLACEMENT 10 FRENCH BY 9cm;  Surgeon: Golda Claudis PENNER, MD;  Location: [facility];  Service: Endoscopy;  Laterality: N/A;   BILIARY STENT PLACEMENT N/A 01/19/2022   Procedure: BILIARY STENT PLACEMENT;  Surgeon: Wilhelmenia Aloha Mickey., MD;  Location: [facility];  Service: Gastroenterology;  Laterality: N/A;   BIOPSY  01/19/2022   Procedure: BIOPSY;  Surgeon: Wilhelmenia Aloha Mickey., MD;  Location: [facility];  Service: Gastroenterology;;   CATARACT EXTRACTION Bilateral    CHOLECYSTECTOMY N/A 04/25/2021   Procedure: LAPAROSCOPIC CHOLECYSTECTOMY;  Surgeon: Mavis Anes, MD;  [facility]: [facility];  Service: General;  Laterality: N/A;   ENDOSCOPIC RETROGRADE CHOLANGIOPANCREATOGRAPHY (ERCP) WITH PROPOFOL  N/A 01/19/2022   Procedure: ENDOSCOPIC RETROGRADE CHOLANGIOPANCREATOGRAPHY (ERCP) WITH PROPOFOL ;  Surgeon: Wilhelmenia Aloha Mickey., MD;  Location: [facility];  Service: Gastroenterology;  Laterality: N/A;   ENDOSCOPIC RETROGRADE CHOLANGIOPANCREATOGRAPHY (ERCP) WITH PROPOFOL  N/A 03/12/2022   Procedure: ENDOSCOPIC RETROGRADE CHOLANGIOPANCREATOGRAPHY (ERCP) WITH PROPOFOL ;  Surgeon: Wilhelmenia Aloha Mickey., MD;  [facility];  Service: Gastroenterology;  Laterality: N/A;   ERCP N/A 02/06/2021   Procedure: ENDOSCOPIC RETROGRADE CHOLANGIOPANCREATOGRAPHY (ERCP);  Surgeon: Golda Claudis PENNER, MD;  [facility];  Service: Gastroenterology;  Laterality: N/A;   ERCP N/A 03/26/2021   Procedure: ENDOSCOPIC RETROGRADE CHOLANGIOPANCREATOGRAPHY (ERCP) with EXTENDED SPHINCTEROTOMY;  Surgeon: Golda Claudis PENNER, MD;  Location: [facility];  Service: Endoscopy;  Laterality: N/A;   ERCP N/A 10/27/2021   Procedure: ENDOSCOPIC RETROGRADE CHOLANGIOPANCREATOGRAPHY (ERCP);  Surgeon: Golda Claudis PENNER, MD;  [facility]: [facility];  Service: Endoscopy;  Laterality: N/A;  no site    GASTROINTESTINAL STENT REMOVAL N/A 03/26/2021   Procedure: GASTROINTESTINAL STENT REMOVAL;  Surgeon: Golda Claudis PENNER, MD;  [facility]: [facility];  Service: Endoscopy;  Laterality: N/A;   GASTROINTESTINAL STENT REMOVAL N/A 10/27/2021   Procedure: GASTROINTESTINAL STENT EXCHANGE;  Surgeon: Golda Claudis PENNER, MD;  [facility];  Service: Endoscopy;  Laterality: N/A;  no site    REMOVAL OF STONES N/A 03/26/2021   Procedure: REMOVAL OF STONES WITH EXTRACTION BASKET;  Surgeon: Golda Claudis PENNER, MD;  [facility];  Service: Endoscopy;  Laterality: N/A;   REMOVAL OF STONES N/A 10/27/2021   Procedure: REMOVAL OF STONES;  Surgeon: Golda Claudis PENNER, MD;  [facility];  Service: Endoscopy;  Laterality: N/A;   REMOVAL OF STONES  01/19/2022   Procedure:  REMOVAL OF STONES;  Surgeon: Wilhelmenia Aloha Mickey., MD;  Location: [facility];  Service: Gastroenterology;;   REMOVAL OF STONES  03/12/2022   Procedure: REMOVAL OF STONES;  Surgeon: Wilhelmenia Aloha Mickey., MD;  Location: [facility];  Service: Gastroenterology;;   ANNETT N/A 02/06/2021   Procedure: ANNETT;  Surgeon: Golda Claudis PENNER, MD;  [facility]: [facility];  Service: Gastroenterology;  Laterality: N/A;   SPHINCTEROTOMY N/A 10/27/2021   Procedure: SPHINCTEROTOMY;  Surgeon: Golda Claudis PENNER, MD;  [facility];  Service: Endoscopy;  Laterality: N/A;   SPYGLASS CHOLANGIOSCOPY N/A 01/19/2022   Procedure: SPYGLASS CHOLANGIOSCOPY;  Surgeon: Wilhelmenia Aloha Mickey., MD;  [facility]: [facility];  Service: Gastroenterology;  Laterality: N/A;   SPYGLASS CHOLANGIOSCOPY N/A 03/12/2022   Procedure: DEBHOJDD CHOLANGIOSCOPY;  Surgeon: Wilhelmenia Aloha Mickey., MD;  [facility];  Service: Gastroenterology;  Laterality: N/A;   SPYGLASS LITHOTRIPSY N/A  03/12/2022   Procedure: DEBHOJDD LITHOTRIPSY;  Surgeon: Wilhelmenia Aloha Raddle., MD;  Location: [facility];  Service: Gastroenterology;  Laterality: N/A;   STENT REMOVAL  01/19/2022   Procedure: STENT REMOVAL;  Surgeon: Wilhelmenia Aloha Raddle., MD;  Location: [facility];  Service: Gastroenterology;;   CLEDA REMOVAL  03/12/2022   Procedure: STENT REMOVAL;  Surgeon: Wilhelmenia Aloha Raddle., MD;  Location: [facility];  Service: Gastroenterology;;   STONE EXTRACTION WITH BASKET  01/19/2022   Procedure: STONE EXTRACTION WITH BASKET;  Surgeon: Wilhelmenia Aloha Raddle., MD;  [facility];  Service: Gastroenterology;;   Family History Family History  Problem Relation Age of Onset   Stroke Mother    Colon cancer Neg Hx    Esophageal cancer Neg Hx    Inflammatory bowel disease Neg Hx    Liver disease Neg Hx    Pancreatic cancer Neg Hx    Rectal cancer Neg Hx    Stomach cancer Neg Hx     Social History Social History    Tobacco Use   Smoking status: Never    Passive exposure: Past   Smokeless tobacco: Never  Vaping Use   Vaping status: Never Used  Substance Use Topics   Alcohol use: Never   Drug use: Never   Allergies Patient has no known allergies.  Review of Systems A thorough review of systems was obtained and all systems are negative except as noted in the HPI and PMH.   Physical Exam Vital Signs  I have reviewed the triage vital signs BP 124/70   Pulse (!) 103   Temp 98.4 F (36.9 C) (Oral)   Resp (!) 21   Ht 5' 6.5 (1.689 m)   Wt 70.8 kg   SpO2 96%   BMI 24.80 kg/m  Physical Exam Vitals and nursing note reviewed.  Constitutional:      General: He is not in acute distress.    Appearance: Normal appearance. He is well-developed.  HENT:     Head: Normocephalic and atraumatic.     Right Ear: External ear normal.     Left Ear: External ear normal.     Mouth/Throat:     Mouth: Mucous membranes are dry.  Eyes:     General: No scleral icterus. Cardiovascular:     Rate and Rhythm: Regular rhythm. Tachycardia present.     Pulses: Normal pulses.     Heart sounds: Normal heart sounds.  Pulmonary:     Effort: Pulmonary effort is normal. No respiratory distress.     Breath sounds: Normal breath sounds.     Comments: Coarse breath sounds Abdominal:     General: Abdomen is flat.     Palpations: Abdomen is soft.     Tenderness: There is no abdominal tenderness.  Genitourinary:    Comments: Foley in place Musculoskeletal:     Cervical back: No rigidity.     Right lower leg: No edema.     Left lower leg: No edema.  Skin:    General: Skin is warm and dry.     Capillary Refill: Capillary refill takes less than 2 seconds.  Neurological:     Mental Status: He is alert.  Psychiatric:        Mood and Affect: Mood normal.        Behavior: Behavior normal.     ED Results and Treatments Labs (all labs ordered are listed, but only abnormal results are displayed) Labs Reviewed   COMPREHENSIVE METABOLIC PANEL WITH GFR - Abnormal; Notable for  the following components:      Result Value   Potassium 3.1 (*)    CO2 21 (*)    Glucose, Bld 176 (*)    AST 47 (*)    Alkaline Phosphatase 155 (*)    Anion gap 16 (*)    All other components within normal limits  CBC - Abnormal; Notable for the following components:   WBC 13.2 (*)    RBC 3.07 (*)    Hemoglobin 10.7 (*)    HCT 30.6 (*)    MCH 34.9 (*)    RDW 19.3 (*)    All other components within normal limits  URINALYSIS, ROUTINE W REFLEX MICROSCOPIC - Abnormal; Notable for the following components:   Color, Urine AMBER (*)    APPearance CLOUDY (*)    Hgb urine dipstick LARGE (*)    Ketones, ur 5 (*)    Protein, ur 100 (*)    Leukocytes,Ua MODERATE (*)    Bacteria, UA MANY (*)    All other components within normal limits  CBG MONITORING, ED - Abnormal; Notable for the following components:   Glucose-Capillary 180 (*)    All other components within normal limits  RESP PANEL BY RT-PCR (RSV, FLU A&B, COVID)  RVPGX2  CULTURE, BLOOD (ROUTINE X 2)  CULTURE, BLOOD (ROUTINE X 2)  EXPECTORATED SPUTUM ASSESSMENT W GRAM STAIN, RFLX TO RESP C  URINE CULTURE  LACTIC ACID, PLASMA  LACTIC ACID, PLASMA                                                                                                                          Radiology DG Chest Portable 1 View Result Date: 05/01/2024 EXAM: 1 VIEW(S) XRAY OF THE CHEST 05/01/2024 11:44:12 AM COMPARISON: PET CT 03/30/2024 and CT chest 03/19/2024 CLINICAL HISTORY: productive cough FINDINGS: LUNGS AND PLEURA: Bilateral interstitial prominence. Patchy airspace opacity in right upper lung, possibly representing pneumonia or pulmonary nodule. Bronchitic changes. No pulmonary edema. No pleural effusion. No pneumothorax. HEART AND MEDIASTINUM: Aortic atherosclerosis. No acute abnormality of the cardiac and mediastinal silhouettes. BONES AND SOFT TISSUES: Diffuse osteopenia. IMPRESSION: 1.  Patchy airspace opacity in the right upper lung, suspicious for pneumonia versus pulmonary nodule (in the setting of known metastatic renal cell carcinoma). Electronically signed by: Waddell Calk MD 05/01/2024 01:20 PM EST RP Workstation: [id number]    Pertinent labs & imaging results that were available during my care of the patient were reviewed by me and considered in my medical decision making (see MDM for details).  Medications Ordered in ED Medications  sodium chloride  0.9 % bolus 1,000 mL (has no administration in time range)  potassium chloride  SA (KLOR-CON  M) CR tablet 40 mEq (has no administration in time range)  cefTRIAXone  (ROCEPHIN ) 1 g in sodium chloride  0.9 % 100 mL IVPB (has no administration in time range)  azithromycin (ZITHROMAX) 500 mg in sodium chloride  0.9 % 250 mL IVPB (has no administration in time range)  Procedures .Critical Care  Performed by: Elnor Jayson LABOR, DO Authorized by: Elnor Jayson LABOR, DO   Critical care provider statement:    Critical care time (minutes):  45   Critical care time was exclusive of:  Separately billable procedures and treating other patients   Critical care was necessary to treat or prevent imminent or life-threatening deterioration of the following conditions:  Sepsis   Critical care was time spent personally by me on the following activities:  Development of treatment plan with patient or surrogate, discussions with consultants, evaluation of patient's response to treatment, examination of patient, ordering and review of laboratory studies, ordering and review of radiographic studies, ordering and performing treatments and interventions, pulse oximetry, re-evaluation of patient's condition, review of old charts and obtaining history from patient or surrogate   Care discussed with: admitting provider      (including critical care time)  Medical Decision Making / ED Course    Medical Decision Making:    ARHAN MCMANAMON is a 83 y.o. male  with past medical history as below, significant for renal cell carcinoma (urothelial carcinoma), bladder cancer, hypertension, pancreatitis, IDA who presents to the ED with complaint of cough, dyspnea, fatigue. The complaint involves an extensive differential diagnosis and also carries with it a high risk of complications and morbidity.  Serious etiology was considered. Ddx includes but is not limited to: In my evaluation of this patient's dyspnea my DDx includes, but is not limited to, pneumonia, pulmonary embolism, pneumothorax, pulmonary edema, metabolic acidosis, asthma, COPD, cardiac cause, anemia, anxiety, etc.    Complete initial physical exam performed, notably the patient was in acute distress, tachycardia noted.    Reviewed and confirmed nursing documentation for past medical history, family history, social history.  Vital signs reviewed.    CAP Sepsis> - Patient with cough with green sputum, body aches, subjective fever, tachycardia, tachypnea, leukocytosis. - Concerning for pneumonia.  Start Rocephin  azithromycin, send blood and sputum cultures.  Meets criteria for code sepsis.  Patient is HDS - Tachycardia improving - Urinalysis also with notable infection, exchange Foley, send urine culture - admit [facility]  Hypokalemia > - poor po last few days - replace                     Additional history obtained: -Additional history obtained from family -External records from outside source obtained and reviewed including: Chart review including previous notes, labs, imaging, consultation notes including  Recent ER evaluation, oncology documentation   Lab Tests: -I ordered, reviewed, and interpreted labs.   The pertinent results include:   Labs Reviewed  COMPREHENSIVE METABOLIC PANEL WITH GFR - Abnormal; Notable for the  following components:      Result Value   Potassium 3.1 (*)    CO2 21 (*)    Glucose, Bld 176 (*)    AST 47 (*)    Alkaline Phosphatase 155 (*)    Anion gap 16 (*)    All other components within normal limits  CBC - Abnormal; Notable for the following components:   WBC 13.2 (*)    RBC 3.07 (*)    Hemoglobin 10.7 (*)    HCT 30.6 (*)    MCH 34.9 (*)    RDW 19.3 (*)    All other components within normal limits  URINALYSIS, ROUTINE W REFLEX MICROSCOPIC - Abnormal; Notable for the following components:   Color, Urine AMBER (*)    APPearance CLOUDY (*)    Hgb urine dipstick  LARGE (*)    Ketones, ur 5 (*)    Protein, ur 100 (*)    Leukocytes,Ua MODERATE (*)    Bacteria, UA MANY (*)    All other components within normal limits  CBG MONITORING, ED - Abnormal; Notable for the following components:   Glucose-Capillary 180 (*)    All other components within normal limits  RESP PANEL BY RT-PCR (RSV, FLU A&B, COVID)  RVPGX2  CULTURE, BLOOD (ROUTINE X 2)  CULTURE, BLOOD (ROUTINE X 2)  EXPECTORATED SPUTUM ASSESSMENT W GRAM STAIN, RFLX TO RESP C  URINE CULTURE  LACTIC ACID, PLASMA  LACTIC ACID, PLASMA    Notable for as above  EKG   EKG Interpretation Date/Time:    Ventricular Rate:    PR Interval:    QRS Duration:    QT Interval:    QTC Calculation:   R Axis:      Text Interpretation:           Imaging Studies ordered: I ordered imaging studies including chest x-ray I independently visualized the following imaging with scope of interpretation limited to determining acute life threatening conditions related to emergency care; findings noted above I agree with the radiologist interpretation If any imaging was obtained with contrast I closely monitored patient for any possible adverse reaction a/w contrast administration in the emergency department   Medicines ordered and prescription drug management: Meds ordered this encounter  Medications   sodium chloride  0.9 % bolus  1,000 mL   potassium chloride  SA (KLOR-CON  M) CR tablet 40 mEq   cefTRIAXone  (ROCEPHIN ) 1 g in sodium chloride  0.9 % 100 mL IVPB    Antibiotic Indication::   CAP   azithromycin (ZITHROMAX) 500 mg in sodium chloride  0.9 % 250 mL IVPB    Antibiotic Indication::   CAP    -I have reviewed the patients home medicines and have made adjustments as needed   Consultations Obtained: I requested consultation with the [facility],  and discussed lab and imaging findings as well as pertinent plan    Cardiac Monitoring: The patient was maintained on a cardiac monitor.  I personally viewed and interpreted the cardiac monitored which showed an underlying rhythm of: sinus tachy > improving Continuous pulse oximetry interpreted by myself, 95% on RA.    Social Determinants of Health:  Diagnosis or treatment significantly limited by social determinants of health: na   Reevaluation: After the interventions noted above, I reevaluated the patient and found that they have improved  Co morbidities that complicate the patient evaluation  Past Medical History:  Diagnosis Date   Bladder cancer (HCC)    Hypertension       Dispostion: Disposition decision including need for hospitalization was considered, and patient admitted to the hospital.    Final Clinical Impression(s) / ED Diagnoses Final diagnoses:  Community acquired pneumonia, unspecified laterality  Sepsis, due to unspecified organism, unspecified whether acute organ dysfunction present (HCC)  Acute cystitis with hematuria  Hypokalemia        Elnor Jayson LABOR, DO 05/01/24 1355

## 2024-05-01 NOTE — ED Triage Notes (Signed)
 Pt arrived via [facility] c/o worsening weakness over the past couple of weeks. Pt reports he is receiving treatment for his cancer and has recently started coughing up green phlegm.

## 2024-05-01 NOTE — Sepsis Progress Note (Signed)
Notified bedside nurse of need to draw blood cultures.  

## 2024-05-01 NOTE — Progress Notes (Addendum)
 Pt ambulated with standby assist to BR, had large soft (type 5) brown stool and passed a lot of gas. Back to bed, was administered pain med for low back pain. Currently, phlebotomy at bedside drawing third lactic acid.  MD Sigdel notified by [facility] page of second lactic acid level of 3.7.

## 2024-05-01 NOTE — Progress Notes (Signed)
 Pt arrived to room 330 via stretcher from ED. Pt ambulatory from bed to standing scale and then to bed, only required standby assistance. Oriented to room and safety procedures. Call bell within reach, bed alarm on for safety, advised to call for needs. Pt and daughter state understanding.  Additionally, critical value result called from lab - Lactic Acid 3.5. MD Sigdel notified via [facility] page, awaiting results.  Notified by Sepsis e-link of need for repeat Lactic Acid now. Phlebotomy staff notified to come draw blood.

## 2024-05-02 DIAGNOSIS — A419 Sepsis, unspecified organism: Secondary | ICD-10-CM | POA: Diagnosis not present

## 2024-05-02 DIAGNOSIS — J189 Pneumonia, unspecified organism: Secondary | ICD-10-CM | POA: Diagnosis not present

## 2024-05-02 LAB — COMPREHENSIVE METABOLIC PANEL WITH GFR
ALT: 31 U/L (ref 0–44)
AST: 53 U/L — ABNORMAL HIGH (ref 15–41)
Albumin: 2.9 g/dL — ABNORMAL LOW (ref 3.5–5.0)
Alkaline Phosphatase: 127 U/L — ABNORMAL HIGH (ref 38–126)
Anion gap: 10 (ref 5–15)
BUN: 18 mg/dL (ref 8–23)
CO2: 24 mmol/L (ref 22–32)
Calcium: 8.2 mg/dL — ABNORMAL LOW (ref 8.9–10.3)
Chloride: 103 mmol/L (ref 98–111)
Creatinine, Ser: 0.88 mg/dL (ref 0.61–1.24)
GFR, Estimated: >60 mL/min (ref >60)
Glucose, Bld: 109 mg/dL — ABNORMAL HIGH (ref 70–99)
Potassium: 3.1 mmol/L — ABNORMAL LOW (ref 3.5–5.1)
Sodium: 137 mmol/L (ref 135–145)
Total Bilirubin: 0.5 mg/dL (ref 0.0–1.2)
Total Protein: 6 g/dL — ABNORMAL LOW (ref 6.5–8.1)

## 2024-05-02 LAB — CBC
HCT: 25.3 % — ABNORMAL LOW (ref 39.0–52.0)
Hemoglobin: 8.7 g/dL — ABNORMAL LOW (ref 13.0–17.0)
MCH: 33 pg (ref 26.0–34.0)
MCHC: 34.4 g/dL (ref 30.0–36.0)
MCV: 95.8 fL (ref 80.0–100.0)
Platelets: 213 K/uL (ref 150–400)
RBC: 2.64 MIL/uL — ABNORMAL LOW (ref 4.22–5.81)
RDW: 17.4 % — ABNORMAL HIGH (ref 11.5–15.5)
WBC: 7.7 K/uL (ref 4.0–10.5)
nRBC: 0 % (ref 0.0–0.2)

## 2024-05-02 MED ORDER — ENSURE PLUS HIGH PROTEIN PO LIQD
237.0000 mL | Freq: Three times a day (TID) | ORAL | Status: DC
  Administered 2024-05-03 – 2024-05-04 (×3): 237 mL via ORAL

## 2024-05-02 MED ORDER — ADULT MULTIVITAMIN W/MINERALS CH
1.0000 | ORAL_TABLET | Freq: Every day | ORAL | Status: DC
  Administered 2024-05-02 – 2024-05-03 (×2): 1 via ORAL
  Filled 2024-05-02 (×3): qty 1

## 2024-05-02 MED ORDER — PROCHLORPERAZINE EDISYLATE 10 MG/2ML IJ SOLN
10.0000 mg | Freq: Four times a day (QID) | INTRAMUSCULAR | Status: DC | PRN
  Administered 2024-05-02 – 2024-05-03 (×2): 10 mg via INTRAVENOUS
  Filled 2024-05-02 (×2): qty 2

## 2024-05-02 NOTE — Progress Notes (Signed)
 PROGRESS NOTE    Dean Randolph  FMW:[id number] DOB: September 24, 1940 DOA: 05/01/2024 PCP: Pcp, No   Brief Narrative:    83 y.o. male with medical history significant of urothelial carcinoma on chemotherapy, kidney stones presents to the ER with complaints of generalized weakness, cough with sputum, dyspnea.  Patient has not been feeling well for the past couple of days.  He was diagnosed with urothelial carcinoma per retroperitoneal lymph node biopsy at the end of September 2025.  PET scan was completed and is found to have metastatic lesions in his spine as well as multiple lung nodules as well as thoracic and abdominal lymphadenopathy.  He sees oncology and was initiated on therapy.  He did receive infusion of Enfortumab Vedotin-EjFv on 04/24/2024.  He had issues with urinary dribbling and difficulty urination and had to come to the ER on 04/25/2024 where Foley catheter was placed.  UA was negative at that time.   He is being treated for sepsis likely secondary to UTI and pneumonia.  Follow up blood cultures.  Assessment & Plan:  Principal Problem:   Sepsis due to pneumonia [facility]) Active Problems:   Iron deficiency anemia   Sepsis secondary to acute uncomplicated as well as blood urinary tract infection as well as possible bacterial community-acquired pneumonia.   - continue with Rocephin , azithromycin to cover for pneumonia as well as UTI. Follow-up blood culture, urine culture - Follow-up repeat lactate levels -Will continue IV fluid    Generalized weakness/physical deconditioning: Secondary to above, diarrhea, malignancy Consulted PT/OT  Home health services will be arranged on discharge    Urinary retention: Foley catheter was placed 04/24/2024 for urinary retention.  Will continue it for now.  Patient has follow-up appointment with urology.   Urothelial carcinoma stage IV - Started on targeted therapy, continue outpatient follow-up with oncology. -Next chemo/immunotherapy  treatment is next week.   Hypokalemia - Replace as needed   Iron deficiency anemia - Hemoglobin is stable at 10.5.  Continue outpatient follow-up   Hypertension - Blood pressure stable.  Hold antihypertensives in the setting of sepsis   Diarrhea: Resolved  Disposition: Home.  Patient lives at home with his wife.  He will need home health services arrangement on discharge.  Case management has been made aware.   DVT prophylaxis: enoxaparin (LOVENOX) injection 40 mg Start: 05/01/24 1800 SCDs Start: 05/01/24 1452     Code Status: Full Code Family Communication: Daughter at the bedside Status is: Inpatient Remains inpatient appropriate because: Sepsis, UTI, pneumonia    Subjective:  Patient states that he feels better.  Daughter was present at bedside.  Patient had an episode of diarrhea yesterday.  He told me that he had chemotherapy and immunotherapy 2 weeks back.  He has been feeling weak for the past 2 days with some loss of appetite.  He lives at home with his wife who has chronic leukemia.  Patient's daughter is interested in setting up home health services.  Case management has been made aware.   Examination:  General exam: Appears calm and comfortable  Respiratory system: Clear to auscultation. Respiratory effort normal. Cardiovascular system: S1 & S2 heard, RRR. No JVD, murmurs, rubs, gallops or clicks. No pedal edema. Gastrointestinal system: Abdomen is nondistended, soft and nontender. No organomegaly or masses felt. Normal bowel sounds heard. Central nervous system: Alert and oriented. No focal neurological deficits. Extremities: Symmetric 5 x 5 power. Skin: No rashes, lesions or ulcers Psychiatry: Judgement and insight appear normal. Mood & affect appropriate. Genitourinary:  Foley's catheter in place       Diet Orders (From admission, onward)     Start     Ordered   05/01/24 1453  Diet regular Room service appropriate? Yes; Fluid consistency: Thin  Diet  effective now       Question Answer Comment  Room service appropriate? Yes   Fluid consistency: Thin      05/01/24 1453            Objective: Vitals:   05/01/24 1600 05/01/24 1813 05/01/24 2117 05/02/24 0418  BP: 128/69 120/64 (!) 120/58 (!) 144/66  Pulse: (!) 108 (!) 101 93 83  Resp: 20 20 20 18   Temp: 97.6 F (36.4 C) 98.4 F (36.9 C) 97.8 F (36.6 C) (!) 97.4 F (36.3 C)  TempSrc: Oral Oral Oral Oral  SpO2: 100% 100% 96% 93%  Weight: 69.4 kg     Height: 5' 5 (1.651 m)       Intake/Output Summary (Last 24 hours) at 05/02/2024 0940 Last data filed at 05/02/2024 0357 Gross per 24 hour  Intake 2123.45 ml  Output --  Net 2123.45 ml   Filed Weights   05/01/24 1054 05/01/24 1600  Weight: 70.8 kg 69.4 kg    Scheduled Meds:  Chlorhexidine  Gluconate Cloth  6 each Topical Daily   enoxaparin (LOVENOX) injection  40 mg Subcutaneous Q24H   feeding supplement  237 mL Oral BID BM   Continuous Infusions:  azithromycin     cefTRIAXone  (ROCEPHIN )  IV 1 g (05/02/24 0930)   lactated ringers  1,000 mL with potassium chloride  20 mEq infusion 75 mL/hr at 05/02/24 0501    Nutritional status     Body mass index is 25.46 kg/m.  Data Reviewed:   CBC: Recent Labs  Lab 04/25/24 2018 05/01/24 1117 05/02/24 0438  WBC 9.0 13.2* 7.7  NEUTROABS 6.9  --   --   HGB 10.7* 10.7* 8.7*  HCT 31.7* 30.6* 25.3*  MCV 98.8 99.7 95.8  PLT 266 307 213   Basic Metabolic Panel: Recent Labs  Lab 04/25/24 2018 05/01/24 1117 05/02/24 0438  NA 137 135 137  K 3.7 3.1* 3.1*  CL 101 98 103  CO2 21* 21* 24  GLUCOSE 120* 176* 109*  BUN 19 23 18   CREATININE 0.86 1.19 0.88  CALCIUM 9.3 9.2 8.2*   GFR: Estimated Creatinine Clearance: 57.3 mL/min (by C-G formula based on SCr of 0.88 mg/dL). Liver Function Tests: Recent Labs  Lab 05/01/24 1117 05/02/24 0438  AST 47* 53*  ALT 42 31  ALKPHOS 155* 127*  BILITOT 0.6 0.5  PROT 7.3 6.0*  ALBUMIN 3.7 2.9*   No results for input(s):  LIPASE, AMYLASE in the last 168 hours. No results for input(s): AMMONIA in the last 168 hours. Coagulation Profile: No results for input(s): INR, PROTIME in the last 168 hours. Cardiac Enzymes: No results for input(s): CKTOTAL, CKMB, CKMBINDEX, TROPONINI in the last 168 hours. BNP (last 3 results) No results for input(s): PROBNP in the last 8760 hours. HbA1C: No results for input(s): HGBA1C in the last 72 hours. CBG: Recent Labs  Lab 05/01/24 1137  GLUCAP 180*   Lipid Profile: No results for input(s): CHOL, HDL, LDLCALC, TRIG, CHOLHDL, LDLDIRECT in the last 72 hours. Thyroid Function Tests: No results for input(s): TSH, T4TOTAL, FREET4, T3FREE, THYROIDAB in the last 72 hours. Anemia Panel: No results for input(s): VITAMINB12, FOLATE, FERRITIN, TIBC, IRON, RETICCTPCT in the last 72 hours. Sepsis Labs: Recent Labs  Lab 05/01/24 1413 05/01/24  1635 05/01/24 1856 05/01/24 2143  LATICACIDVEN 3.5* 3.7* 4.0* 1.8    Recent Results (from the past 240 hours)  Resp panel by RT-PCR (RSV, Flu A&B, Covid) Anterior Nasal Swab     Status: None   Collection Time: 05/01/24 11:38 AM   Specimen: Anterior Nasal Swab  Result Value Ref Range Status   SARS Coronavirus 2 by RT PCR NEGATIVE NEGATIVE Final    Comment: (NOTE) SARS-CoV-2 target nucleic acids are NOT DETECTED.  The SARS-CoV-2 RNA is generally detectable in upper respiratory specimens during the acute phase of infection. The lowest concentration of SARS-CoV-2 viral copies this assay can detect is 138 copies/mL. A negative result does not preclude SARS-Cov-2 infection and should not be used as the sole basis for treatment or other patient management decisions. A negative result may occur with  improper specimen collection/handling, submission of specimen other than nasopharyngeal swab, presence of viral mutation(s) within the areas targeted by this assay, and inadequate number  of viral copies(<138 copies/mL). A negative result must be combined with clinical observations, patient history, and epidemiological information. The expected result is Negative.  Fact Sheet for Patients:  bloggercourse.com  Fact Sheet for Healthcare Providers:  seriousbroker.it  This test is no t yet approved or cleared by the [place]  FDA and  has been authorized for detection and/or diagnosis of SARS-CoV-2 by FDA under an Emergency Use Authorization (EUA). This EUA will remain  in effect (meaning this test can be used) for the duration of the COVID-19 declaration under Section 564(b)(1) of the Act, 21 U.S.C.section 360bbb-3(b)(1), unless the authorization is terminated  or revoked sooner.       Influenza A by PCR NEGATIVE NEGATIVE Final   Influenza B by PCR NEGATIVE NEGATIVE Final    Comment: (NOTE) The Xpert Xpress SARS-CoV-2/FLU/RSV plus assay is intended as an aid in the diagnosis of influenza from Nasopharyngeal swab specimens and should not be used as a sole basis for treatment. Nasal washings and aspirates are unacceptable for Xpert Xpress SARS-CoV-2/FLU/RSV testing.  Fact Sheet for Patients: bloggercourse.com  Fact Sheet for Healthcare Providers: seriousbroker.it  This test is not yet approved or cleared by the [place]  FDA and has been authorized for detection and/or diagnosis of SARS-CoV-2 by FDA under an Emergency Use Authorization (EUA). This EUA will remain in effect (meaning this test can be used) for the duration of the COVID-19 declaration under Section 564(b)(1) of the Act, 21 U.S.C. section 360bbb-3(b)(1), unless the authorization is terminated or revoked.     Resp Syncytial Virus by PCR NEGATIVE NEGATIVE Final    Comment: (NOTE) Fact Sheet for Patients: bloggercourse.com  Fact Sheet for Healthcare  Providers: seriousbroker.it  This test is not yet approved or cleared by the [place]  FDA and has been authorized for detection and/or diagnosis of SARS-CoV-2 by FDA under an Emergency Use Authorization (EUA). This EUA will remain in effect (meaning this test can be used) for the duration of the COVID-19 declaration under Section 564(b)(1) of the Act, 21 U.S.C. section 360bbb-3(b)(1), unless the authorization is terminated or revoked.  Performed at [facility], [address], [place], [place] [address]   Blood culture (routine x 2)     Status: None (Preliminary result)   Collection Time: 05/01/24  2:13 PM   Specimen: BLOOD  Result Value Ref Range Status   Specimen Description BLOOD BLOOD RIGHT ARM  Final   Special Requests   Final    BOTTLES DRAWN AEROBIC AND ANAEROBIC Blood Culture results may  not be optimal due to an inadequate volume of blood received in culture bottles   Culture   Final    NO GROWTH < 24 HOURS Performed at [facility], [address], [place], [place] [address]    Report Status PENDING  Incomplete  Blood culture (routine x 2)     Status: None (Preliminary result)   Collection Time: 05/01/24  2:13 PM   Specimen: BLOOD  Result Value Ref Range Status   Specimen Description BLOOD BLOOD RIGHT HAND  Final   Special Requests   Final    BOTTLES DRAWN AEROBIC AND ANAEROBIC Blood Culture adequate volume   Culture   Final    NO GROWTH < 24 HOURS Performed at [facility], [address] [place], [place] [address]    Report Status PENDING  Incomplete         Radiology Studies: DG Chest Portable 1 View Result Date: 05/01/2024 EXAM: 1 VIEW(S) XRAY OF THE CHEST 05/01/2024 11:44:12 AM COMPARISON: PET CT 03/30/2024 and CT chest 03/19/2024 CLINICAL HISTORY: productive cough FINDINGS: LUNGS AND PLEURA: Bilateral interstitial prominence. Patchy airspace opacity in right upper lung, possibly representing pneumonia or pulmonary nodule.  Bronchitic changes. No pulmonary edema. No pleural effusion. No pneumothorax. HEART AND MEDIASTINUM: Aortic atherosclerosis. No acute abnormality of the cardiac and mediastinal silhouettes. BONES AND SOFT TISSUES: Diffuse osteopenia. IMPRESSION: 1. Patchy airspace opacity in the right upper lung, suspicious for pneumonia versus pulmonary nodule (in the setting of known metastatic renal cell carcinoma). Electronically signed by: Waddell Calk MD 05/01/2024 01:20 PM EST RP Workstation: [id number]           LOS: 1 day   Time spent= 35 mins    Deliliah Room, MD [facility]  If 7PM-7AM, please contact night-coverage  05/02/2024, 9:40 AM

## 2024-05-02 NOTE — Plan of Care (Signed)

## 2024-05-02 NOTE — Plan of Care (Signed)

## 2024-05-02 NOTE — Progress Notes (Signed)
 Mobility Specialist Progress Note:    05/02/24 0915  Mobility  Activity Ambulated with assistance  Level of Assistance Minimal assist, patient does 75% or more  Assistive Device None  Distance Ambulated (ft) 10 ft  Range of Motion/Exercises Active;All extremities  Activity Response Tolerated well  Mobility Referral Yes  Mobility visit 1 Mobility  Mobility Specialist Start Time (ACUTE ONLY) 0915  Mobility Specialist Stop Time (ACUTE ONLY) 0932  Mobility Specialist Time Calculation (min) (ACUTE ONLY) 17 min   Pt received in bathroom, requesting assistance to bed. Family in room, required MinA to stand and CGA to ambulate with no AD. Tolerated well, asx throughout. Left supine, alarm on. All needs met.  Odell Fasching Mobility Specialist Please contact via Special Educational Needs Teacher or  Rehab office at [phone]

## 2024-05-02 NOTE — Plan of Care (Signed)

## 2024-05-02 NOTE — Discharge Instructions (Signed)

## 2024-05-02 NOTE — Progress Notes (Signed)
   05/01/24 2100  Provider Notification  Provider Name/Title Dr. Manfred  Date Provider Notified 05/01/24  Time Provider Notified 2058  Notification Reason Critical Result  Test performed and critical result lactic acid-4.0  Date Critical Result Received 05/01/24  Time Critical Result Received 2055  Provider response Other (Comment);See new orders (continue to trend and maintain IV fluids)  Date of Provider Response 05/01/24  Time of Provider Response 2110

## 2024-05-02 NOTE — Progress Notes (Signed)
  Transition of Care [facility]) Screening Note   Patient Details  Name: Dean Randolph Date of Birth: [birth year]   Transition of Care [facility]) CM/SW Contact:    [name], LCSW Phone Number: 05/02/2024, 9:56 AM    Transition of Care Department [facility]) has reviewed patient and no TOC needs have been identified at this time. We will continue to monitor patient advancement through interdisciplinary progression rounds. If new patient transition needs arise, please place a TOC consult.    05/02/24 0955  TOC Brief Assessment  Insurance and Status Reviewed  Patient has primary care physician No (PCP list added to AVS)  Home environment has been reviewed Home w/ spouse  Prior level of function: Independent  Prior/Current Home Services No current home services  Social Drivers of Health Review SDOH reviewed no interventions necessary  Readmission risk has been reviewed Yes  Transition of care needs no transition of care needs at this time

## 2024-05-02 NOTE — Progress Notes (Signed)
 Lactic acid 1.8 at this time. Dr. Adefeso made aware, with no further need for recheck at this time. Sepsis RN aware as well.

## 2024-05-02 NOTE — Progress Notes (Signed)
 Initial Nutrition Assessment  DOCUMENTATION CODES:   Severe malnutrition in context of acute illness/injury  INTERVENTION:   Ensure Plus High Protein po TID, each supplement provides 350 kcal and 20 grams of protein Magic cup TID with meals, each supplement provides 290 kcal and 9 grams of protein MVI with minerals daily  NUTRITION DIAGNOSIS:   Severe Malnutrition related to acute illness (urothelial carcinoma, PNA) as evidenced by moderate muscle depletion, moderate fat depletion, percent weight loss (8% weight loss within 3 months).  GOAL:   Patient will meet greater than or equal to 90% of their needs  MONITOR:   PO intake, Supplement acceptance  REASON FOR ASSESSMENT:   Malnutrition Screening Tool    ASSESSMENT:   83 yo male admitted with sepsis d/t PNA, iron deficiency anemia. PMH includes urothelial carcinoma on chemotherapy, HTN.  Spoke with patient and his daughter at bedside. Patient has had a poor appetite for several weeks d/t cancer treatments and now PNA. He endorses some weight loss. He drinks Boost once or twice daily at home.  Weight history reviewed. 8% weight loss within the past 1.5 months.  Labs reviewed.  K 3.1  Medications reviewed.   Patient meets criteria for severe malnutrition, given moderate depletion of muscle and subcutaneous fat mass with 8% weight loss within 3 months.  NUTRITION - FOCUSED PHYSICAL EXAM:  Flowsheet Row Most Recent Value  Orbital Region Moderate depletion  Upper Arm Region No depletion  Thoracic and Lumbar Region Mild depletion  Buccal Region Moderate depletion  Temple Region Moderate depletion  Clavicle Bone Region Moderate depletion  Clavicle and Acromion Bone Region Moderate depletion  Scapular Bone Region Moderate depletion  Dorsal Hand Mild depletion  Patellar Region Mild depletion  Anterior Thigh Region Mild depletion  Posterior Calf Region Mild depletion  Edema (RD Assessment) None  Hair Reviewed  Eyes  Reviewed  Mouth Reviewed  Skin Reviewed  Nails Reviewed    Diet Order:   Diet Order             Diet regular Room service appropriate? Yes; Fluid consistency: Thin  Diet effective now                   EDUCATION NEEDS:   Education needs have been addressed  Skin:  Skin Assessment: Reviewed RN Assessment  Last BM:  11/3  Height:   Ht Readings from Last 1 Encounters:  05/01/24 5' 5 (1.651 m)    Weight:   Wt Readings from Last 1 Encounters:  05/01/24 69.4 kg    Ideal Body Weight:  61.8 kg  BMI:  Body mass index is 25.46 kg/m.  Estimated Nutritional Needs:   Kcal:  2000-2200  Protein:  95-115 gm  Fluid:  >/= 2 L   Suzen HUNT RD, LDN, CNSC Contact via secure chat. If unavailable, use group chat RD Inpatient.

## 2024-05-03 DIAGNOSIS — E43 Unspecified severe protein-calorie malnutrition: Secondary | ICD-10-CM | POA: Insufficient documentation

## 2024-05-03 DIAGNOSIS — J189 Pneumonia, unspecified organism: Secondary | ICD-10-CM | POA: Diagnosis not present

## 2024-05-03 DIAGNOSIS — A419 Sepsis, unspecified organism: Secondary | ICD-10-CM | POA: Diagnosis not present

## 2024-05-03 LAB — BASIC METABOLIC PANEL WITH GFR
Anion gap: 11 (ref 5–15)
BUN: 11 mg/dL (ref 8–23)
CO2: 24 mmol/L (ref 22–32)
Calcium: 8.5 mg/dL — ABNORMAL LOW (ref 8.9–10.3)
Chloride: 103 mmol/L (ref 98–111)
Creatinine, Ser: 0.77 mg/dL (ref 0.61–1.24)
GFR, Estimated: >60 mL/min (ref >60)
Glucose, Bld: 127 mg/dL — ABNORMAL HIGH (ref 70–99)
Potassium: 3.4 mmol/L — ABNORMAL LOW (ref 3.5–5.1)
Sodium: 138 mmol/L (ref 135–145)

## 2024-05-03 LAB — CBC WITH DIFFERENTIAL/PLATELET
Abs Immature Granulocytes: 0.13 K/uL — ABNORMAL HIGH (ref 0.00–0.07)
Basophils Absolute: 0.1 K/uL (ref 0.0–0.1)
Basophils Relative: 1 %
Eosinophils Absolute: 0.1 K/uL (ref 0.0–0.5)
Eosinophils Relative: 2 %
HCT: 27.1 % — ABNORMAL LOW (ref 39.0–52.0)
Hemoglobin: 9.2 g/dL — ABNORMAL LOW (ref 13.0–17.0)
Immature Granulocytes: 2 %
Lymphocytes Relative: 8 %
Lymphs Abs: 0.6 K/uL — ABNORMAL LOW (ref 0.7–4.0)
MCH: 33.5 pg (ref 26.0–34.0)
MCHC: 33.9 g/dL (ref 30.0–36.0)
MCV: 98.5 fL (ref 80.0–100.0)
Monocytes Absolute: 0.8 K/uL (ref 0.1–1.0)
Monocytes Relative: 11 %
Neutro Abs: 5.9 K/uL (ref 1.7–7.7)
Neutrophils Relative %: 76 %
Platelets: 257 K/uL (ref 150–400)
RBC: 2.75 MIL/uL — ABNORMAL LOW (ref 4.22–5.81)
RDW: 18.1 % — ABNORMAL HIGH (ref 11.5–15.5)
WBC: 7.7 K/uL (ref 4.0–10.5)
nRBC: 0.3 % — ABNORMAL HIGH (ref 0.0–0.2)

## 2024-05-03 MED ORDER — POTASSIUM CHLORIDE 20 MEQ PO PACK
20.0000 meq | PACK | Freq: Once | ORAL | Status: AC
  Administered 2024-05-03: 20 meq via ORAL
  Filled 2024-05-03: qty 1

## 2024-05-03 MED ORDER — FAMOTIDINE 20 MG PO TABS
20.0000 mg | ORAL_TABLET | Freq: Every day | ORAL | Status: DC
  Administered 2024-05-03: 20 mg via ORAL
  Filled 2024-05-03: qty 1

## 2024-05-03 MED ORDER — ALUM & MAG HYDROXIDE-SIMETH 200-200-20 MG/5ML PO SUSP
30.0000 mL | ORAL | Status: DC | PRN
  Administered 2024-05-03: 30 mL via ORAL
  Filled 2024-05-03: qty 30

## 2024-05-03 NOTE — Plan of Care (Signed)
  Problem: Acute Rehab PT Goals(only PT should resolve) Goal: Pt Will Go Supine/Side To Sit Outcome: Progressing Flowsheets (Taken 05/03/2024 1524) Pt will go Supine/Side to Sit: with supervision Goal: Patient Will Transfer Sit To/From Stand Outcome: Progressing Flowsheets (Taken 05/03/2024 1524) Patient will transfer sit to/from stand: with supervision Goal: Pt Will Transfer Bed To Chair/Chair To Bed Outcome: Progressing Flowsheets (Taken 05/03/2024 1524) Pt will Transfer Bed to Chair/Chair to Bed: with supervision Goal: Pt Will Ambulate Outcome: Progressing Flowsheets (Taken 05/03/2024 1524) Pt will Ambulate:  50 feet  with least restrictive assistive device  with supervision    3:25 PM, 05/03/24 Rosaria Settler, PT, DPT [place] with [facility]

## 2024-05-03 NOTE — Plan of Care (Signed)

## 2024-05-03 NOTE — Evaluation (Signed)
 Physical Therapy Evaluation Patient Details Name: Dean Randolph MRN: [id number] DOB: 05/15/1941 Today's Date: 05/03/2024  History of Present Illness  Dean Randolph is a 83 y.o. male with medical history significant of urothelial carcinoma on chemotherapy, kidney stones presents to the ER with complaints of generalized weakness, cough with sputum, dyspnea.  Patient has not been feeling well for the past couple of days.  He was diagnosed with urothelial carcinoma per retroperitoneal lymph node biopsy at the end of September 2025.  PET scan was completed and is found to have metastatic lesions in his spine as well as multiple lung nodules as well as thoracic and abdominal lymphadenopathy.  He sees oncology and was initiated on therapy.  He did receive infusion of Enfortumab Vedotin-EjFv on 04/24/2024.  He had issues with urinary dribbling and difficulty urination and had to come to the ER on 04/25/2024 where Foley catheter was placed.  UA was negative at that time.   Clinical Impression  Patient agreeable to PT evaluation. Reports at baseline, he is independent with all mobility, ADLs, transfers and ambulation without an AD. Reports having good family support at home. On this date, patient required min assist for bed mobility via pull to sit, and CGA during STS and ambulation. Pt limited due to general weakness, impaired balance via unsteadiness with initial stand and steps, as well as fatigue. Pt returns to bed at end of session, call button within reach and bed alarm set. Patient will benefit from continued skilled physical therapy acutely and in recommended venue in order to address current deficits and improve overall function.        If plan is discharge home, recommend the following: A little help with walking and/or transfers;A little help with bathing/dressing/bathroom;Assist for transportation;Assistance with cooking/housework   Can travel by private vehicle        Equipment  Recommendations None recommended by PT  Recommendations for Other Services       Functional Status Assessment Patient has had a recent decline in their functional status and demonstrates the ability to make significant improvements in function in a reasonable and predictable amount of time.     Precautions / Restrictions Precautions Precautions: Fall Recall of Precautions/Restrictions: Intact Restrictions Weight Bearing Restrictions Per Provider Order: No      Mobility  Bed Mobility Overal bed mobility: Needs Assistance Bed Mobility: Supine to Sit, Sit to Supine     Supine to sit: Min assist Sit to supine: Min assist   General bed mobility comments: min assist for trunk elevation with HOB flat    Transfers Overall transfer level: Needs assistance Equipment used: None Transfers: Sit to/from Stand Sit to Stand: Contact guard assist           General transfer comment: CGA for safety during STS, pt mildly unsteady after initial stand, reaches to wall for stability, reports no dizziness    Ambulation/Gait Ambulation/Gait assistance: Contact guard assist Gait Distance (Feet): 50 Feet Assistive device: None Gait Pattern/deviations: Step-through pattern, Decreased step length - right, Decreased step length - left, Trunk flexed, Decreased stride length Gait velocity: Dec     General Gait Details: Pt ambulates in room and hallway iwth CGA, and no AD> pt initlaly unsteady but improves some with duration of trial. Reporting feeling close to his baseline  Stairs            Wheelchair Mobility     Tilt Bed    Modified Rankin (Stroke Patients Only)  Balance Overall balance assessment: Needs assistance Sitting-balance support: Feet supported, No upper extremity supported Sitting balance-Leahy Scale: Good Sitting balance - Comments: Seated EOB   Standing balance support: During functional activity, No upper extremity supported Standing balance-Leahy  Scale: Fair Standing balance comment: w/o AD           Pertinent Vitals/Pain Pain Assessment Pain Assessment: Faces Faces Pain Scale: Hurts little more Pain Location: Low back Pain Descriptors / Indicators: Sore Pain Intervention(s): Limited activity within patient's tolerance, Repositioned    Home Living Family/patient expects to be discharged to:: Private residence Living Arrangements: Spouse/significant other;Children Available Help at Discharge: Family;Available 24 hours/day Type of Home: House Home Access: Level entry       Home Layout: One level;Laundry or work area in [employer] Equipment: Agricultural Consultant (2 wheels);BSC/3in1;Shower seat;Wheelchair - manual;Cane - single point Additional Comments: Reports he lives with wife and son lives with them. Son is able to assist physically if needed    Prior Function Prior Level of Function : Independent/Modified Independent     Mobility Comments: Pt reports as community ambulator without AD, reports no falls ADLs Comments: Reports independence with ADLs/iADLs     Extremity/Trunk Assessment   Upper Extremity Assessment Upper Extremity Assessment: Overall WFL for tasks assessed;Generalized weakness (shoulder flexion AROM limited bilaterally, ~100 deg, Reports previously broken collar bone on R. shoulder flexion MMT 4-/5)    Lower Extremity Assessment Lower Extremity Assessment: Generalized weakness;Overall [facility] for tasks assessed (ankle DF MMT 4/5 bilaterally, hip flexion 4-/5 bilaterally)    Cervical / Trunk Assessment Cervical / Trunk Assessment: Kyphotic  Communication   Communication Communication: Impaired Factors Affecting Communication: Reduced clarity of speech    Cognition Arousal: Alert Behavior During Therapy: WFL for tasks assessed/performed   PT - Cognitive impairments: No apparent impairments       Following commands: Intact       Cueing Cueing Techniques: Verbal cues, Visual cues      General Comments      Exercises     Assessment/Plan    PT Assessment Patient needs continued PT services;All further PT needs can be met in the next venue of care  PT Problem List Decreased strength;Decreased range of motion;Decreased activity tolerance;Decreased balance;Decreased mobility;Pain       PT Treatment Interventions DME instruction;Gait training;Functional mobility training;Therapeutic activities;Therapeutic exercise;Balance training;Patient/family education    PT Goals (Current goals can be found in the Care Plan section)  Acute Rehab PT Goals Patient Stated Goal: Return home PT Goal Formulation: With patient Time For Goal Achievement: 05/05/24 Potential to Achieve Goals: Good    Frequency Min 3X/week     Co-evaluation               AM-PAC PT 6 Clicks Mobility  Outcome Measure Help needed turning from your back to your side while in a flat bed without using bedrails?: A Little Help needed moving from lying on your back to sitting on the side of a flat bed without using bedrails?: A Little Help needed moving to and from a bed to a chair (including a wheelchair)?: A Little Help needed standing up from a chair using your arms (e.g., wheelchair or bedside chair)?: A Little Help needed to walk in hospital room?: A Little Help needed climbing 3-5 steps with a railing? : A Little 6 Click Score: 18    End of Session Equipment Utilized During Treatment: Gait belt Activity Tolerance: Patient tolerated treatment well Patient left: in bed;with call bell/phone within reach;with bed  alarm set Nurse Communication: Mobility status PT Visit Diagnosis: Unsteadiness on feet (R26.81);Pain;Muscle weakness (generalized) (M62.81) Pain - part of body:  (low back)    Time: 1425-1441 PT Time Calculation (min) (ACUTE ONLY): 16 min   Charges:   PT Evaluation $PT Eval Low Complexity: 1 Low   PT General Charges $$ ACUTE PT VISIT: 1 Visit         3:22 PM,  05/03/24 Sheilah Rayos Powell-Butler, PT, DPT [place] with [facility]

## 2024-05-03 NOTE — TOC Progression Note (Addendum)
 Transition of Care [facility]) - Progression Note    Patient Details  Name: Dean Randolph MRN: [id number] Date of Birth: 07/28/1940  [facility]) CM/SW Contact  [name], LCSW Phone Number: 05/03/2024, 3:57 PM  Clinical Narrative:    Pt recommended for HHPT. Spoke with pt and daughter regarding barriers to [facility]. Pt does not have an active PCP which, is a requirement in order to obtain continued HH orders. Pt has an appt in February to establish services with a new PCP. CSW discussed option for OPPT. Pt's daughter shares she does not feel that pt nor his spouse should be driving and family are unable to provide ongoing transportation to OPPT appointments.  CSW has reached out to [facility] CSW to determine if oncologist may be willing to write orders for [facility] in the meantime.    ADDENDUM: Pt's oncologist Dr Davonna is willing/able to write continued HH orders. HHPT has been arranged with [facility]. HH order will need to be placed prior to discharge. CSW spoke with pt's daughter to provide update.    Expected Discharge Plan: Home w Home Health Services Barriers to Discharge: No Barriers Identified               Expected Discharge Plan and Services In-house Referral: Clinical Social Work Discharge Planning Services: NA Post Acute Care Choice: Home Health Living arrangements for the past 2 months: Single Family Home                 DME Arranged: N/A DME Agency: NA       HH Arranged: PT HH AgencyHotel Manager Home Health Care Date HH Agency Contacted: 05/03/24 Time HH Agency Contacted: 1557 Representative spoke with at [facility] Agency: Joane   Social Drivers of Health (SDOH) Interventions SDOH Screenings   Food Insecurity: No Food Insecurity (05/01/2024)  Housing: Low Risk  (05/01/2024)  Transportation Needs: No Transportation Needs (05/01/2024)  Utilities: Not At Risk (05/01/2024)  Depression (PHQ2-9): Low Risk  (04/24/2024)  Social Connections: Moderately Integrated  (05/01/2024)  Tobacco Use: Low Risk  (05/01/2024)    Readmission Risk Interventions    05/02/2024    9:55 AM  Readmission Risk Prevention Plan  Transportation Screening Complete  PCP or Specialist Appt within 5-7 Days Not Complete  Not Complete comments PCP list added to AVS  Home Care Screening Complete  Medication Review (RN CM) Complete

## 2024-05-03 NOTE — Progress Notes (Signed)
 PROGRESS NOTE    Dean Randolph  FMW:[id number] DOB: 1941/01/04 DOA: 05/01/2024 PCP: Pcp, No   Brief Narrative:    83 y.o. male with medical history significant of urothelial carcinoma on chemotherapy, kidney stones presents to the ER with complaints of generalized weakness, cough with sputum, dyspnea.  Patient has not been feeling well for the past couple of days.  He was diagnosed with urothelial carcinoma per retroperitoneal lymph node biopsy at the end of September 2025.  PET scan was completed and is found to have metastatic lesions in his spine as well as multiple lung nodules as well as thoracic and abdominal lymphadenopathy.  He sees oncology and was initiated on therapy.  He did receive infusion of Enfortumab Vedotin-EjFv on 04/24/2024.  He had issues with urinary dribbling and difficulty urination and had to come to the ER on 04/25/2024 where Foley catheter was placed.  UA was negative at that time.   He is being treated for sepsis likely secondary to UTI and pneumonia.  Follow up blood cultures.  Assessment & Plan:  Principal Problem:   Sepsis due to pneumonia [facility]) Active Problems:   Iron deficiency anemia   Protein-calorie malnutrition, severe   Sepsis secondary to acute uncomplicated urinary tract infection as well as possible bacterial community-acquired pneumonia.   - continue with Rocephin , azithromycin to cover for pneumonia as well as UTI. Follow-up blood culture, urine culture (Gram negative rods) - Follow-up repeat lactate levels -Dced IVF    Generalized weakness/physical deconditioning: Secondary to above, diarrhea, malignancy Consulted PT/OT  Home health services will be arranged on discharge    Urinary retention: Foley catheter was placed 04/24/2024 for urinary retention.  Will continue it for now.  Patient has follow-up appointment with urology.   Urothelial carcinoma stage IV - Started on targeted therapy, continue outpatient follow-up with  oncology. -Next chemo/immunotherapy treatment is next week.   Hypokalemia - Replace as needed   Iron deficiency anemia - Hemoglobin is stable.  Continue outpatient follow-up   Hypertension - Blood pressure stable.  Hold antihypertensives in the setting of sepsis   Diarrhea: Resolved  Disposition: Home.  Patient lives at home with his wife.  He will need home health services arrangement on discharge.  Case management has been made aware.   DVT prophylaxis: enoxaparin (LOVENOX) injection 40 mg Start: 05/01/24 1800 SCDs Start: 05/01/24 1452     Code Status: Full Code Family Communication: Daughter at the bedside Status is: Inpatient Remains inpatient appropriate because: Sepsis, UTI, pneumonia    Subjective:  Daughter is at the bedside. He had episodes of agitation overnight. He is having breakfast and says that he feels better. We talked about possible HHS arrangement at discharge. Also,talked about his foley catheter and need for outpatient urology follow up on discharge.   Examination:  General exam: Appears calm and comfortable  Respiratory system: Clear to auscultation. Respiratory effort normal. Cardiovascular system: S1 & S2 heard, RRR. No JVD, murmurs, rubs, gallops or clicks. No pedal edema. Gastrointestinal system: Abdomen is nondistended, soft and nontender. No organomegaly or masses felt. Normal bowel sounds heard. Central nervous system: Alert and oriented. No focal neurological deficits. Extremities: Symmetric 5 x 5 power. Skin: No rashes, lesions or ulcers Psychiatry: Judgement and insight appear normal. Mood & affect appropriate. Genitourinary: Foley's catheter in place       Diet Orders (From admission, onward)     Start     Ordered   05/01/24 1453  Diet regular Room service appropriate? Yes;  Fluid consistency: Thin  Diet effective now       Question Answer Comment  Room service appropriate? Yes   Fluid consistency: Thin      05/01/24 1453             Objective: Vitals:   05/02/24 1347 05/02/24 2004 05/02/24 2150 05/03/24 0445  BP: 134/67 (!) 183/78 (!) 160/75 (!) 157/68  Pulse: (!) 104 (!) 110 (!) 111 (!) 108  Resp:  20 20 19   Temp: 98.7 F (37.1 C) 99.8 F (37.7 C) 98.2 F (36.8 C) 98.8 F (37.1 C)  TempSrc: Oral Oral Oral Oral  SpO2: 97% 95% 95% 95%  Weight:      Height:        Intake/Output Summary (Last 24 hours) at 05/03/2024 0957 Last data filed at 05/03/2024 9347 Gross per 24 hour  Intake 347.77 ml  Output 1950 ml  Net -1602.23 ml   Filed Weights   05/01/24 1054 05/01/24 1600  Weight: 70.8 kg 69.4 kg    Scheduled Meds:  Chlorhexidine  Gluconate Cloth  6 each Topical Daily   enoxaparin (LOVENOX) injection  40 mg Subcutaneous Q24H   feeding supplement  237 mL Oral TID BM   multivitamin with minerals  1 tablet Oral Daily   Continuous Infusions:  azithromycin 500 mg (05/03/24 0810)   cefTRIAXone  (ROCEPHIN )  IV 1 g (05/03/24 0936)    Nutritional status Signs/Symptoms: moderate muscle depletion, moderate fat depletion, percent weight loss (8% weight loss within 3 months) Percent weight loss: 8 % Interventions: Ensure Enlive (each supplement provides 350kcal and 20 grams of protein), MVI, Magic cup Body mass index is 25.46 kg/m.  Data Reviewed:   CBC: Recent Labs  Lab 05/01/24 1117 05/02/24 0438 05/03/24 0437  WBC 13.2* 7.7 7.7  NEUTROABS  --   --  5.9  HGB 10.7* 8.7* 9.2*  HCT 30.6* 25.3* 27.1*  MCV 99.7 95.8 98.5  PLT 307 213 257   Basic Metabolic Panel: Recent Labs  Lab 05/01/24 1117 05/02/24 0438 05/03/24 0437  NA 135 137 138  K 3.1* 3.1* 3.4*  CL 98 103 103  CO2 21* 24 24  GLUCOSE 176* 109* 127*  BUN 23 18 11   CREATININE 1.19 0.88 0.77  CALCIUM 9.2 8.2* 8.5*   GFR: Estimated Creatinine Clearance: 63 mL/min (by C-G formula based on SCr of 0.77 mg/dL). Liver Function Tests: Recent Labs  Lab 05/01/24 1117 05/02/24 0438  AST 47* 53*  ALT 42 31  ALKPHOS 155* 127*   BILITOT 0.6 0.5  PROT 7.3 6.0*  ALBUMIN 3.7 2.9*   No results for input(s): LIPASE, AMYLASE in the last 168 hours. No results for input(s): AMMONIA in the last 168 hours. Coagulation Profile: No results for input(s): INR, PROTIME in the last 168 hours. Cardiac Enzymes: No results for input(s): CKTOTAL, CKMB, CKMBINDEX, TROPONINI in the last 168 hours. BNP (last 3 results) No results for input(s): PROBNP in the last 8760 hours. HbA1C: No results for input(s): HGBA1C in the last 72 hours. CBG: Recent Labs  Lab 05/01/24 1137  GLUCAP 180*   Lipid Profile: No results for input(s): CHOL, HDL, LDLCALC, TRIG, CHOLHDL, LDLDIRECT in the last 72 hours. Thyroid Function Tests: No results for input(s): TSH, T4TOTAL, FREET4, T3FREE, THYROIDAB in the last 72 hours. Anemia Panel: No results for input(s): VITAMINB12, FOLATE, FERRITIN, TIBC, IRON, RETICCTPCT in the last 72 hours. Sepsis Labs: Recent Labs  Lab 05/01/24 1413 05/01/24 1635 05/01/24 1856 05/01/24 2143  LATICACIDVEN 3.5* 3.7* 4.0*  1.8    Recent Results (from the past 240 hours)  Resp panel by RT-PCR (RSV, Flu A&B, Covid) Anterior Nasal Swab     Status: None   Collection Time: 05/01/24 11:38 AM   Specimen: Anterior Nasal Swab  Result Value Ref Range Status   SARS Coronavirus 2 by RT PCR NEGATIVE NEGATIVE Final    Comment: (NOTE) SARS-CoV-2 target nucleic acids are NOT DETECTED.  The SARS-CoV-2 RNA is generally detectable in upper respiratory specimens during the acute phase of infection. The lowest concentration of SARS-CoV-2 viral copies this assay can detect is 138 copies/mL. A negative result does not preclude SARS-Cov-2 infection and should not be used as the sole basis for treatment or other patient management decisions. A negative result may occur with  improper specimen collection/handling, submission of specimen other than nasopharyngeal swab, presence of  viral mutation(s) within the areas targeted by this assay, and inadequate number of viral copies(<138 copies/mL). A negative result must be combined with clinical observations, patient history, and epidemiological information. The expected result is Negative.  Fact Sheet for Patients:  bloggercourse.com  Fact Sheet for Healthcare Providers:  seriousbroker.it  This test is no t yet approved or cleared by the [place]  FDA and  has been authorized for detection and/or diagnosis of SARS-CoV-2 by FDA under an Emergency Use Authorization (EUA). This EUA will remain  in effect (meaning this test can be used) for the duration of the COVID-19 declaration under Section 564(b)(1) of the Act, 21 U.S.C.section 360bbb-3(b)(1), unless the authorization is terminated  or revoked sooner.       Influenza A by PCR NEGATIVE NEGATIVE Final   Influenza B by PCR NEGATIVE NEGATIVE Final    Comment: (NOTE) The Xpert Xpress SARS-CoV-2/FLU/RSV plus assay is intended as an aid in the diagnosis of influenza from Nasopharyngeal swab specimens and should not be used as a sole basis for treatment. Nasal washings and aspirates are unacceptable for Xpert Xpress SARS-CoV-2/FLU/RSV testing.  Fact Sheet for Patients: bloggercourse.com  Fact Sheet for Healthcare Providers: seriousbroker.it  This test is not yet approved or cleared by the [place]  FDA and has been authorized for detection and/or diagnosis of SARS-CoV-2 by FDA under an Emergency Use Authorization (EUA). This EUA will remain in effect (meaning this test can be used) for the duration of the COVID-19 declaration under Section 564(b)(1) of the Act, 21 U.S.C. section 360bbb-3(b)(1), unless the authorization is terminated or revoked.     Resp Syncytial Virus by PCR NEGATIVE NEGATIVE Final    Comment: (NOTE) Fact Sheet for  Patients: bloggercourse.com  Fact Sheet for Healthcare Providers: seriousbroker.it  This test is not yet approved or cleared by the [place]  FDA and has been authorized for detection and/or diagnosis of SARS-CoV-2 by FDA under an Emergency Use Authorization (EUA). This EUA will remain in effect (meaning this test can be used) for the duration of the COVID-19 declaration under Section 564(b)(1) of the Act, 21 U.S.C. section 360bbb-3(b)(1), unless the authorization is terminated or revoked.  Performed at [facility], [address] [place], [place] [address]   Urine Culture     Status: Abnormal (Preliminary result)   Collection Time: 05/01/24  1:13 PM   Specimen: Urine, Clean Catch  Result Value Ref Range Status   Specimen Description   Final    URINE, CLEAN CATCH Performed at [facility], [address] [place], [place] [address]    Special Requests   Final    NONE Performed at [facility], [address], [place], [place] [address]    Culture 50,000 COLONIES/mL GRAM NEGATIVE RODS (A)  Final   Report Status PENDING  Incomplete  Blood culture (routine x 2)     Status: None (Preliminary result)   Collection Time: 05/01/24  2:13 PM   Specimen: BLOOD  Result Value Ref Range Status   Specimen Description BLOOD BLOOD RIGHT ARM  Final   Special Requests   Final    BOTTLES DRAWN AEROBIC AND ANAEROBIC Blood Culture results may not be optimal due to an inadequate volume of blood received in culture bottles   Culture   Final    NO GROWTH < 24 HOURS Performed at [facility], [address], [place], [place] [address]    Report Status PENDING  Incomplete  Blood culture (routine x 2)     Status: None (Preliminary result)   Collection Time: 05/01/24  2:13 PM   Specimen: BLOOD  Result Value Ref Range Status   Specimen Description BLOOD BLOOD RIGHT HAND  Final   Special Requests   Final    BOTTLES DRAWN AEROBIC AND ANAEROBIC Blood  Culture adequate volume   Culture   Final    NO GROWTH < 24 HOURS Performed at [facility], [address] [place], [place] [address]    Report Status PENDING  Incomplete         Radiology Studies: DG Chest Portable 1 View Result Date: 05/01/2024 EXAM: 1 VIEW(S) XRAY OF THE CHEST 05/01/2024 11:44:12 AM COMPARISON: PET CT 03/30/2024 and CT chest 03/19/2024 CLINICAL HISTORY: productive cough FINDINGS: LUNGS AND PLEURA: Bilateral interstitial prominence. Patchy airspace opacity in right upper lung, possibly representing pneumonia or pulmonary nodule. Bronchitic changes. No pulmonary edema. No pleural effusion. No pneumothorax. HEART AND MEDIASTINUM: Aortic atherosclerosis. No acute abnormality of the cardiac and mediastinal silhouettes. BONES AND SOFT TISSUES: Diffuse osteopenia. IMPRESSION: 1. Patchy airspace opacity in the right upper lung, suspicious for pneumonia versus pulmonary nodule (in the setting of known metastatic renal cell carcinoma). Electronically signed by: Waddell Calk MD 05/01/2024 01:20 PM EST RP Workstation: [id number]           LOS: 2 days   Time spent= 35 mins    Deliliah Room, MD [facility]  If 7PM-7AM, please contact night-coverage  05/03/2024, 9:57 AM

## 2024-05-04 DIAGNOSIS — A419 Sepsis, unspecified organism: Secondary | ICD-10-CM | POA: Diagnosis not present

## 2024-05-04 DIAGNOSIS — J189 Pneumonia, unspecified organism: Secondary | ICD-10-CM | POA: Diagnosis not present

## 2024-05-04 LAB — URINE CULTURE: Culture: 50000 — AB

## 2024-05-04 MED ORDER — FAMOTIDINE 20 MG PO TABS: 20.0000 mg | ORAL_TABLET | Freq: Every day | ORAL | 0 refills | Status: DC

## 2024-05-04 MED ORDER — LEVOFLOXACIN 250 MG PO TABS: 500.0000 mg | ORAL_TABLET | Freq: Every day | ORAL | 0 refills | Status: AC

## 2024-05-04 NOTE — Plan of Care (Signed)

## 2024-05-04 NOTE — Care Management Important Message (Signed)
 Important Message  Patient Details  Name: GAR GLANCE MRN: [id number] Date of Birth: 07-06-1940   Important Message Given:  Yes - Medicare IM     Jolisa Intriago L Ahsley Attwood 05/04/2024, 10:20 AM

## 2024-05-04 NOTE — TOC Transition Note (Signed)
 Transition of Care [facility]) - Discharge Note   Patient Details  Name: Dean Randolph MRN: [id number] Date of Birth: 06/05/1941  Transition of Care [facility]) CM/SW Contact:  [name], LCSW Phone Number: 05/04/2024, 10:12 AM   Clinical Narrative:    Pt to return home with spouse. Pt to receive HHPT through [facility]. HH orders are in place. No further TOC needs identified.    Final next level of care: Home w Home Health Services Barriers to Discharge: No Barriers Identified   Patient Goals and CMS Choice Patient states their goals for this hospitalization and ongoing recovery are:: For pt to get [facility] CMS Medicare.gov Compare Post Acute Care list provided to:: Patient Choice offered to / list presented to : Patient, Adult Children      Discharge Placement                       Discharge Plan and Services Additional resources added to the After Visit Summary for   In-house Referral: Clinical Social Work Discharge Planning Services: NA Post Acute Care Choice: Home Health          DME Arranged: N/A DME Agency: NA       HH Arranged: PT HH Agency: [facility] Date [facility] Agency Contacted: 05/03/24 Time HH Agency Contacted: [phone] Representative spoke with at [facility] Agency: Joane  Social Drivers of Health (SDOH) Interventions SDOH Screenings   Food Insecurity: No Food Insecurity (05/01/2024)  Housing: Low Risk  (05/01/2024)  Transportation Needs: No Transportation Needs (05/01/2024)  Utilities: Not At Risk (05/01/2024)  Depression (PHQ2-9): Low Risk  (04/24/2024)  Social Connections: Moderately Integrated (05/01/2024)  Tobacco Use: Low Risk  (05/01/2024)     Readmission Risk Interventions    05/02/2024    9:55 AM  Readmission Risk Prevention Plan  Transportation Screening Complete  PCP or Specialist Appt within 5-7 Days Not Complete  Not Complete comments PCP list added to AVS  Home Care Screening Complete  Medication Review (RN CM) Complete

## 2024-05-04 NOTE — Plan of Care (Signed)
  Problem: Education: Goal: Knowledge of General Education information will improve Description: Including pain rating scale, medication(s)/side effects and non-pharmacologic comfort measures 05/04/2024 1005 by Mathews Norleen POUR, RN Outcome: Adequate for Discharge 05/04/2024 0726 by Mathews Norleen POUR, RN Outcome: Progressing   Problem: Health Behavior/Discharge Planning: Goal: Ability to manage health-related needs will improve 05/04/2024 1005 by Mathews Norleen POUR, RN Outcome: Adequate for Discharge 05/04/2024 0726 by Mathews Norleen POUR, RN Outcome: Progressing   Problem: Clinical Measurements: Goal: Ability to maintain clinical measurements within normal limits will improve 05/04/2024 1005 by Mathews Norleen POUR, RN Outcome: Adequate for Discharge 05/04/2024 0726 by Mathews Norleen POUR, RN Outcome: Progressing Goal: Will remain free from infection 05/04/2024 1005 by Mathews Norleen POUR, RN Outcome: Adequate for Discharge 05/04/2024 0726 by Mathews Norleen POUR, RN Outcome: Progressing Goal: Diagnostic test results will improve 05/04/2024 1005 by Mathews Norleen POUR, RN Outcome: Adequate for Discharge 05/04/2024 0726 by Mathews Norleen POUR, RN Outcome: Progressing Goal: Respiratory complications will improve 05/04/2024 1005 by Mathews Norleen POUR, RN Outcome: Adequate for Discharge 05/04/2024 0726 by Mathews Norleen POUR, RN Outcome: Progressing Goal: Cardiovascular complication will be avoided 05/04/2024 1005 by Mathews Norleen POUR, RN Outcome: Adequate for Discharge 05/04/2024 0726 by Mathews Norleen POUR, RN Outcome: Progressing   Problem: Activity: Goal: Risk for activity intolerance will decrease 05/04/2024 1005 by Mathews Norleen POUR, RN Outcome: Adequate for Discharge 05/04/2024 0726 by Mathews Norleen POUR, RN Outcome: Progressing   Problem: Nutrition: Goal: Adequate nutrition will be maintained 05/04/2024 1005 by Mathews Norleen POUR, RN Outcome: Adequate for Discharge 05/04/2024 0726 by Mathews Norleen POUR, RN Outcome: Progressing   Problem: Coping: Goal: Level of anxiety  will decrease 05/04/2024 1005 by Mathews Norleen POUR, RN Outcome: Adequate for Discharge 05/04/2024 0726 by Mathews Norleen POUR, RN Outcome: Progressing   Problem: Elimination: Goal: Will not experience complications related to bowel motility 05/04/2024 1005 by Mathews Norleen POUR, RN Outcome: Adequate for Discharge 05/04/2024 0726 by Mathews Norleen POUR, RN Outcome: Progressing Goal: Will not experience complications related to urinary retention 05/04/2024 1005 by Mathews Norleen POUR, RN Outcome: Adequate for Discharge 05/04/2024 0726 by Mathews Norleen POUR, RN Outcome: Progressing   Problem: Pain Managment: Goal: General experience of comfort will improve and/or be controlled 05/04/2024 1005 by Mathews Norleen POUR, RN Outcome: Adequate for Discharge 05/04/2024 0726 by Mathews Norleen POUR, RN Outcome: Progressing   Problem: Safety: Goal: Ability to remain free from injury will improve 05/04/2024 1005 by Mathews Norleen POUR, RN Outcome: Adequate for Discharge 05/04/2024 0726 by Mathews Norleen POUR, RN Outcome: Progressing   Problem: Skin Integrity: Goal: Risk for impaired skin integrity will decrease 05/04/2024 1005 by Mathews Norleen POUR, RN Outcome: Adequate for Discharge 05/04/2024 0726 by Mathews Norleen POUR, RN Outcome: Progressing

## 2024-05-04 NOTE — Discharge Summary (Signed)
 Physician Discharge Summary   Patient: Dean Randolph MRN: [id number] DOB: August 05, 1940  Admit date:     05/01/2024  Discharge date: 05/04/24  Discharge Physician: Deliliah Room   PCP: Pcp, No   Recommendations at discharge:    F/u with your PCP in one week F/u outpatient with urology in one week.  Discharge Diagnoses: Principal Problem:   Sepsis due to pneumonia [facility]) Active Problems:   Iron deficiency anemia   Protein-calorie malnutrition, severe  Hospital Course:  83 y.o. male with medical history significant of urothelial carcinoma on chemotherapy, kidney stones presents to the ER with complaints of generalized weakness, cough with sputum, dyspnea.  Patient has not been feeling well for the past couple of days.  He was diagnosed with urothelial carcinoma per retroperitoneal lymph node biopsy at the end of September 2025.  PET scan was completed and is found to have metastatic lesions in his spine as well as multiple lung nodules as well as thoracic and abdominal lymphadenopathy.  He sees oncology and was initiated on therapy.  He did receive infusion of Enfortumab Vedotin-EjFv on 04/24/2024.  He had issues with urinary dribbling and difficulty urination and had to come to the ER on 04/25/2024 where Foley catheter was placed.  UA was negative at that time.   He is being treated for sepsis likely secondary to UTI and pneumonia. No growth on the blood cultures. Urine culture grew only 50000 colonies of G- rods but this in the setting of chronic foley so not clinically significant. Discharged on 3 days of oral levofloxacin 500 mg. HHPT arranged. Outpatient f/u with PCP and Urology in one week Oncology appointment as per schedule.       Consultants: None Procedures performed: None  Disposition: Home health Diet recommendation:  Regular diet DISCHARGE MEDICATION: Allergies as of 05/04/2024   No Known Allergies      Medication List     TAKE these medications     Carboxymethylcell-Glycerin PF 0.5-0.9 % Soln Place 2 drops into both eyes 4 (four) times daily.   famotidine 20 MG tablet Commonly known as: PEPCID Take 1 tablet (20 mg total) by mouth at bedtime.   HYDROcodone -acetaminophen  5-325 MG tablet Commonly known as: NORCO/VICODIN Take 2 tablets by mouth every 6 (six) hours as needed for severe pain (pain score 7-10).   levofloxacin 250 MG tablet Commonly known as: Levaquin Take 2 tablets (500 mg total) by mouth daily for 3 days.   lidocaine -prilocaine cream Commonly known as: EMLA Apply to affected area once What changed:  how much to take how to take this when to take this additional instructions   ondansetron  8 MG tablet Commonly known as: Zofran  Take 1 tablet (8 mg total) by mouth every 8 (eight) hours as needed for nausea or vomiting.   prochlorperazine 10 MG tablet Commonly known as: COMPAZINE Take 1 tablet (10 mg total) by mouth every 6 (six) hours as needed for nausea or vomiting.        Follow-up Information     [facility]. Schedule an appointment as soon as possible for a visit in 1 week(s).   Contact information: [address] [place]  [address] [phone]               Discharge Exam: Fredricka Weights   05/01/24 1054 05/01/24 1600  Weight: 70.8 kg 69.4 kg   General exam: Appears calm and comfortable  Respiratory system: Clear to auscultation. Respiratory effort normal. Cardiovascular system: S1 &  S2 heard, RRR. No JVD, murmurs, rubs, gallops or clicks. No pedal edema. Gastrointestinal system: Abdomen is nondistended, soft and nontender. No organomegaly or masses felt. Normal bowel sounds heard. Central nervous system: Alert and oriented. No focal neurological deficits. Extremities: Symmetric 5 x 5 power. Skin: No rashes, lesions or ulcers Psychiatry: Judgement and insight appear normal. Mood & affect appropriate. Genitourinary: Foley's  catheter in place  Condition at discharge: good  The results of significant diagnostics from this hospitalization (including imaging, microbiology, ancillary and laboratory) are listed below for reference.   Imaging Studies: DG Chest Portable 1 View Result Date: 05/01/2024 EXAM: 1 VIEW(S) XRAY OF THE CHEST 05/01/2024 11:44:12 AM COMPARISON: PET CT 03/30/2024 and CT chest 03/19/2024 CLINICAL HISTORY: productive cough FINDINGS: LUNGS AND PLEURA: Bilateral interstitial prominence. Patchy airspace opacity in right upper lung, possibly representing pneumonia or pulmonary nodule. Bronchitic changes. No pulmonary edema. No pleural effusion. No pneumothorax. HEART AND MEDIASTINUM: Aortic atherosclerosis. No acute abnormality of the cardiac and mediastinal silhouettes. BONES AND SOFT TISSUES: Diffuse osteopenia. IMPRESSION: 1. Patchy airspace opacity in the right upper lung, suspicious for pneumonia versus pulmonary nodule (in the setting of known metastatic renal cell carcinoma). Electronically signed by: Waddell Calk MD 05/01/2024 01:20 PM EST RP Workstation: [id number]    Microbiology: Results for orders placed or performed during the hospital encounter of 05/01/24  Resp panel by RT-PCR (RSV, Flu A&B, Covid) Anterior Nasal Swab     Status: None   Collection Time: 05/01/24 11:38 AM   Specimen: Anterior Nasal Swab  Result Value Ref Range Status   SARS Coronavirus 2 by RT PCR NEGATIVE NEGATIVE Final    Comment: (NOTE) SARS-CoV-2 target nucleic acids are NOT DETECTED.  The SARS-CoV-2 RNA is generally detectable in upper respiratory specimens during the acute phase of infection. The lowest concentration of SARS-CoV-2 viral copies this assay can detect is 138 copies/mL. A negative result does not preclude SARS-Cov-2 infection and should not be used as the sole basis for treatment or other patient management decisions. A negative result may occur with  improper specimen collection/handling, submission  of specimen other than nasopharyngeal swab, presence of viral mutation(s) within the areas targeted by this assay, and inadequate number of viral copies(<138 copies/mL). A negative result must be combined with clinical observations, patient history, and epidemiological information. The expected result is Negative.  Fact Sheet for Patients:  bloggercourse.com  Fact Sheet for Healthcare Providers:  seriousbroker.it  This test is no t yet approved or cleared by the [place]  FDA and  has been authorized for detection and/or diagnosis of SARS-CoV-2 by FDA under an Emergency Use Authorization (EUA). This EUA will remain  in effect (meaning this test can be used) for the duration of the COVID-19 declaration under Section 564(b)(1) of the Act, 21 U.S.C.section 360bbb-3(b)(1), unless the authorization is terminated  or revoked sooner.       Influenza A by PCR NEGATIVE NEGATIVE Final   Influenza B by PCR NEGATIVE NEGATIVE Final    Comment: (NOTE) The Xpert Xpress SARS-CoV-2/FLU/RSV plus assay is intended as an aid in the diagnosis of influenza from Nasopharyngeal swab specimens and should not be used as a sole basis for treatment. Nasal washings and aspirates are unacceptable for Xpert Xpress SARS-CoV-2/FLU/RSV testing.  Fact Sheet for Patients: bloggercourse.com  Fact Sheet for Healthcare Providers: seriousbroker.it  This test is not yet approved or cleared by the [place]  FDA and has been authorized for detection and/or diagnosis of SARS-CoV-2 by FDA under an Emergency Use  Authorization (EUA). This EUA will remain in effect (meaning this test can be used) for the duration of the COVID-19 declaration under Section 564(b)(1) of the Act, 21 U.S.C. section 360bbb-3(b)(1), unless the authorization is terminated or revoked.     Resp Syncytial Virus by PCR NEGATIVE NEGATIVE  Final    Comment: (NOTE) Fact Sheet for Patients: bloggercourse.com  Fact Sheet for Healthcare Providers: seriousbroker.it  This test is not yet approved or cleared by the [place]  FDA and has been authorized for detection and/or diagnosis of SARS-CoV-2 by FDA under an Emergency Use Authorization (EUA). This EUA will remain in effect (meaning this test can be used) for the duration of the COVID-19 declaration under Section 564(b)(1) of the Act, 21 U.S.C. section 360bbb-3(b)(1), unless the authorization is terminated or revoked.  Performed at [facility], [address] [place], [place] [address]   Urine Culture     Status: Abnormal (Preliminary result)   Collection Time: 05/01/24  1:13 PM   Specimen: Urine, Clean Catch  Result Value Ref Range Status   Specimen Description   Final    URINE, CLEAN CATCH Performed at [facility], [address], [place], [place] [address]    Special Requests   Final    NONE Performed at [facility], [address], [place], [place] [address]    Culture (A)  Final    50,000 COLONIES/mL GRAM NEGATIVE RODS IDENTIFICATION AND SUSCEPTIBILITIES TO FOLLOW Performed at [facility], [address] [place], [place] [address]    Report Status PENDING  Incomplete  Blood culture (routine x 2)     Status: None (Preliminary result)   Collection Time: 05/01/24  2:13 PM   Specimen: BLOOD  Result Value Ref Range Status   Specimen Description BLOOD BLOOD RIGHT ARM  Final   Special Requests   Final    BOTTLES DRAWN AEROBIC AND ANAEROBIC Blood Culture results may not be optimal due to an inadequate volume of blood received in culture bottles   Culture   Final    NO GROWTH 3 DAYS Performed at [facility], [address] [place], [place] [address]    Report Status PENDING  Incomplete  Blood culture (routine x 2)     Status: None (Preliminary result)   Collection Time: 05/01/24  2:13 PM    Specimen: BLOOD  Result Value Ref Range Status   Specimen Description BLOOD BLOOD RIGHT HAND  Final   Special Requests   Final    BOTTLES DRAWN AEROBIC AND ANAEROBIC Blood Culture adequate volume   Culture   Final    NO GROWTH 3 DAYS Performed at [facility], [address] [place], [place] [address]    Report Status PENDING  Incomplete    Labs: CBC: Recent Labs  Lab 05/01/24 1117 05/02/24 0438 05/03/24 0437  WBC 13.2* 7.7 7.7  NEUTROABS  --   --  5.9  HGB 10.7* 8.7* 9.2*  HCT 30.6* 25.3* 27.1*  MCV 99.7 95.8 98.5  PLT 307 213 257   Basic Metabolic Panel: Recent Labs  Lab 05/01/24 1117 05/02/24 0438 05/03/24 0437  NA 135 137 138  K 3.1* 3.1* 3.4*  CL 98 103 103  CO2 21* 24 24  GLUCOSE 176* 109* 127*  BUN 23 18 11   CREATININE 1.19 0.88 0.77  CALCIUM 9.2 8.2* 8.5*   Liver Function Tests: Recent Labs  Lab 05/01/24 1117 05/02/24 0438  AST 47* 53*  ALT 42 31  ALKPHOS 155* 127*  BILITOT  0.6 0.5  PROT 7.3 6.0*  ALBUMIN 3.7 2.9*   CBG: Recent Labs  Lab 05/01/24 1137  GLUCAP 180*    Discharge time spent: 38 minutes.  Signed: Deliliah Room, MD [facility] 05/04/2024

## 2024-05-05 ENCOUNTER — Other Ambulatory Visit: Payer: Self-pay

## 2024-05-05 ENCOUNTER — Other Ambulatory Visit: Payer: Self-pay | Admitting: Oncology

## 2024-05-05 DIAGNOSIS — N2889 Other specified disorders of kidney and ureter: Secondary | ICD-10-CM

## 2024-05-05 DIAGNOSIS — C689 Malignant neoplasm of urinary organ, unspecified: Secondary | ICD-10-CM

## 2024-05-06 LAB — CULTURE, BLOOD (ROUTINE X 2)
Culture: NO GROWTH
Culture: NO GROWTH
Special Requests: ADEQUATE

## 2024-05-07 NOTE — Progress Notes (Unsigned)
 Patient Care Team: Pcp, No as PCP - General Celestia Joesph SQUIBB, RN as Oncology Nurse Navigator (Medical Oncology) Davonna Siad, MD as Medical Oncologist (Medical Oncology)  Clinic Day:  05/07/2024  Referring physician: Daralene Lonni BIRCH, *   CHIEF COMPLAINT:  CC: Metastatic urothelial carcinoma    ASSESSMENT & PLAN:   Assessment & Plan: Dean Randolph  is a 83 y.o. male with Metastatic urothelial carcinoma   Assessment and Plan Assessment & Plan Stage 4 bladder cancer with metastases to bone, lymph nodes Stage IV urothelial carcinoma of upper urinary tract or collecting duct Oncology history below Path IHC staining consistent with urothelial carcinoma     -We discussed the PET scan results together.  Patient has extensive Mehta stasis involving lymph nodes, bone disease. -We discussed the pathology results in detail and poor prognosis with his stage IV carcinoma.  Goal is to treat with a palliative intent. -Considering the pathology, extent of the disease, patient's comorbidities, I think enfortumab vedotin with pembrolizumab is the best available first-line option for the patient. -The patient was informed of the known and potential side effects of enfortumab vedotin (including fatigue, rash, peripheral neuropathy, high blood sugar, low white blood cell count, eye problems, and rare but serious conditions like Stevens-Johnson syndrome and pneumonitis) and pembrolizumab (which can cause immune-related side effects such as inflammation of the lungs, liver, colon, thyroid, kidneys, and infusion reactions). -They were also informed of the EV-302 trial results, which showed that the combination of enfortumab vedotin and pembrolizumab significantly improved overall survival and progression-free survival compared to standard platinum-based chemotherapy in patients with untreated advanced or metastatic urothelial carcinoma.  -Will schedule chemotherapy education session. - Will  send for [facility] NGS. - Monitor for side effects: rash, difficulty breathing, diarrhea. -Will consider port placement if venous access becomes difficult. - Will repeat PET scan in 3 months.   Return to clinic prior to second cycle of treatment to assess for tolerance   Chronic back pain due to bone metastases Back pain from bone metastases secondary to bladder cancer. Prefers medication management over radiation. Percocet twice daily provides adequate pain control.   - Continue Percocet for pain management. - Reassess pain management strategy if current regimen becomes ineffective. - Will consider adding bone modifying agents once patient is more stable.  This will need dental clearance as well.   Anemia under evaluation Likely secondary to iron deficiency  -IV iron     The patient understands the plans discussed today and is in agreement with them.  He knows to contact our office if he develops concerns prior to his next appointment.  The total time spent in the appointment was *** minutes for the encounter with patient, including review of chart and various tests results, discussions about plan of care and coordination of care plan   Siad Davonna, MD  [facility] [place] - A DEPT OF [facility] [address] [place] [address] Dept: [phone] Dept Fax: [phone]   No orders of the defined types were placed in this encounter.    ONCOLOGY HISTORY:   Diagnosis: Stage IV urothelial carcinoma of upper urinary tract or collecting duct   -Presentation: Patient seen in the ED on 03/19/2024 for right sided back, right flank, and right lower abdominal pain.  -03/19/2024: CT AP:  4.1 x 3.4 cm heterogeneous mass in the anterior interpolar left kidney appears to have enhancing central soft tissue nodules although this could  represent blood clot within a pre-existing lesion. 16 mm potential enhancing nodule in the  anterior right renal hilum. Imaging features are highly suspicious for renal cell carcinoma. Irregular centrally necrotic soft tissue mass in the retroperitoneal space just inferior to the SMA and obscuring the left renal vein, probably occluding/obliterating the left renal venous anatomy. 4.8 x 2.7 cm left periaortic nodal mass with multiple nodules in the left renal hilum and soft tissue encasing the left renal artery. Additional 5.2 x 4.9 cm soft tissue mass in the left abdomen. Borderline enlarged lymph nodes are seen at the aortic bifurcation. No pelvic sidewall lymphadenopathy. Numerous new pulmonary nodules in the lung bases measuring up to 6 mm. Imaging features are highly suspicious for metastatic disease. 14 mm short axis left retrocrural lymph node. Lytic lesions in the L1 and L2 vertebral bodies and posterior right ischial tuberosity, consistent with metastatic disease. -03/19/2024: CT chest: Thoracic nodal and pulmonary parenchymal metastatic disease.  -03/27/2024: Left retroperitoneal mass biopsy.  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  -Immunohistochemistry stains show the neoplasm is positive for cytokeratin AE13, ck8/18, high molecular weight cytokeratin (focal), gata 3, pax8, vimentin. Negative staining for ca-ix, ck7, ck20, cdx2, cd117, nkx3.1. the staining pattern favor metastatic urothelial carcinoma.  -03/30/2024: Initial PET: Left renal hypermetabolic masses consistent with malignancy with associated multiple hypermetabolic retroperitoneal, mediastinal and supraclavicular metastatic lymphadenopathy , osseous, pulmonary and peritoneal metastasis.  Current Treatment:  Enfortumab Vedotin + Pembrolizumab  INTERVAL HISTORY:   Dean Randolph is here today for follow up for ***. Patient is accompanied by *** .   Discussed the  use of AI scribe software for clinical note transcription with the patient, who gave verbal consent to proceed.  History of Present Illness     I have reviewed the past medical history, past surgical history, social history and family history with the patient and they are unchanged from previous note.  ALLERGIES:  has no known allergies.  MEDICATIONS:  Current Outpatient Medications  Medication Sig Dispense Refill   Carboxymethylcell-Glycerin PF 0.5-0.9 % SOLN Place 2 drops into both eyes 4 (four) times daily. (Patient not taking: Reported on 05/01/2024) 1 each 11   famotidine (PEPCID) 20 MG tablet Take 1 tablet (20 mg total) by mouth at bedtime. 15 tablet 0   HYDROcodone -acetaminophen  (NORCO/VICODIN) 5-325 MG tablet Take 2 tablets by mouth every 6 (six) hours as needed for severe pain (pain score 7-10). 240 tablet 0   levofloxacin (LEVAQUIN) 250 MG tablet Take 2 tablets (500 mg total) by mouth daily for 3 days. 6 tablet 0   lidocaine -prilocaine (EMLA) cream Apply to affected area once (Patient taking differently: Apply 1 Application topically once. Apply to affected area once for port access) 30 g 3   ondansetron  (ZOFRAN ) 8 MG tablet Take 1 tablet (8 mg total) by mouth every 8 (eight) hours as needed for nausea or vomiting. 30 tablet 1   prochlorperazine (COMPAZINE) 10 MG tablet Take 1 tablet (10 mg total) by mouth every 6 (six) hours as needed for nausea or vomiting. 30 tablet 1   No current facility-administered medications for this visit.    REVIEW OF SYSTEMS:   Constitutional: Denies fevers, chills or abnormal weight loss Eyes: Denies blurriness of vision Ears, nose, mouth, throat, and face: Denies mucositis or sore throat Respiratory: Denies cough, dyspnea or wheezes Cardiovascular: Denies palpitation, chest discomfort or lower extremity swelling Gastrointestinal:  Denies nausea, heartburn or change in bowel habits Skin: Denies abnormal skin rashes Lymphatics: Denies new  lymphadenopathy or easy bruising Neurological:Denies numbness, tingling or new weaknesses Behavioral/Psych: Mood is stable, no new changes  All other systems were reviewed with the patient and are negative.   VITALS:  There were no vitals taken for this visit.  Wt Readings from Last 3 Encounters:  05/01/24 153 lb (69.4 kg)  04/25/24 156 lb (70.8 kg)  04/24/24 156 lb (70.8 kg)    There is no height or weight on file to calculate BMI.  Performance status (ECOG): 1 - Symptomatic but completely ambulatory  PHYSICAL EXAM:   GENERAL:alert, no distress and comfortable SKIN: skin color, texture, turgor are normal, no rashes or significant lesions EYES: normal, Conjunctiva are pink and non-injected, sclera clear OROPHARYNX:no exudate, no erythema and lips, buccal mucosa, and tongue normal  NECK: supple, thyroid normal size, non-tender, without nodularity LYMPH:  no palpable lymphadenopathy in the cervical, axillary or inguinal LUNGS: clear to auscultation and percussion with normal breathing effort HEART: regular rate & rhythm and no murmurs and no lower extremity edema ABDOMEN:abdomen soft, non-tender and normal bowel sounds Musculoskeletal:no cyanosis of digits and no clubbing  NEURO: alert & oriented x 3 with fluent speech, no focal motor/sensory deficits  LABORATORY DATA:  I have reviewed the data as listed     Component Value Date/Time   NA 138 05/03/2024 0437   NA 135 12/02/2020 1129   K 3.4 (L) 05/03/2024 0437   CL 103 05/03/2024 0437   CO2 24 05/03/2024 0437   GLUCOSE 127 (H) 05/03/2024 0437   BUN 11 05/03/2024 0437   BUN 20 12/02/2020 1129   CREATININE 0.77 05/03/2024 0437   CALCIUM 8.5 (L) 05/03/2024 0437   PROT 6.0 (L) 05/02/2024 0438   PROT 7.7 12/11/2020 1602   ALBUMIN 2.9 (L) 05/02/2024 0438   ALBUMIN 3.7 12/11/2020 1602   AST 53 (H) 05/02/2024 0438   ALT 31 05/02/2024 0438   ALKPHOS 127 (H) 05/02/2024 0438   BILITOT 0.5 05/02/2024 0438   BILITOT 1.6 (H)  12/11/2020 1602   GFRNONAA >60 05/03/2024 0437     Lab Results  Component Value Date   WBC 7.7 05/03/2024   NEUTROABS 5.9 05/03/2024   HGB 9.2 (L) 05/03/2024   HCT 27.1 (L) 05/03/2024   MCV 98.5 05/03/2024   PLT 257 05/03/2024      Chemistry      Component Value Date/Time   NA 138 05/03/2024 0437   NA 135 12/02/2020 1129   K 3.4 (L) 05/03/2024 0437   CL 103 05/03/2024 0437   CO2 24 05/03/2024 0437   BUN 11 05/03/2024 0437   BUN 20 12/02/2020 1129   CREATININE 0.77 05/03/2024 0437      Component Value Date/Time   CALCIUM 8.5 (L) 05/03/2024 0437   ALKPHOS 127 (H) 05/02/2024 0438   AST 53 (H) 05/02/2024 0438   ALT 31 05/02/2024 0438   BILITOT 0.5 05/02/2024 0438   BILITOT 1.6 (H) 12/11/2020 1602        RADIOGRAPHIC STUDIES: I have personally reviewed the radiological images as listed and agreed with the findings in the report.  DG Chest Portable 1 View EXAM: 1 VIEW(S) XRAY OF THE CHEST 05/01/2024 11:44:12 AM  COMPARISON: PET CT 03/30/2024 and CT chest 03/19/2024  CLINICAL HISTORY: productive cough  FINDINGS:  LUNGS AND PLEURA: Bilateral interstitial prominence. Patchy airspace opacity in right upper lung, possibly representing pneumonia or pulmonary nodule. Bronchitic changes.  No pulmonary edema. No pleural effusion. No pneumothorax.  HEART AND MEDIASTINUM: Aortic atherosclerosis. No acute abnormality of the cardiac and mediastinal silhouettes.  BONES AND SOFT TISSUES: Diffuse osteopenia.  IMPRESSION: 1. Patchy airspace opacity in the right upper lung, suspicious for pneumonia versus pulmonary nodule (in the setting of known metastatic renal cell carcinoma).  Electronically signed by: Waddell Calk MD 05/01/2024 01:20 PM EST RP Workstation: [id number]

## 2024-05-08 ENCOUNTER — Inpatient Hospital Stay: Attending: Oncology | Admitting: Oncology

## 2024-05-08 ENCOUNTER — Inpatient Hospital Stay

## 2024-05-08 ENCOUNTER — Other Ambulatory Visit: Payer: Self-pay

## 2024-05-08 ENCOUNTER — Inpatient Hospital Stay: Admitting: Dietician

## 2024-05-08 VITALS — BP 104/79 | HR 114 | Temp 98.1°F | Resp 20 | Ht 69.0 in | Wt 159.9 lb

## 2024-05-08 VITALS — BP 135/67 | HR 90 | Temp 97.2°F | Resp 18

## 2024-05-08 DIAGNOSIS — C689 Malignant neoplasm of urinary organ, unspecified: Secondary | ICD-10-CM

## 2024-05-08 DIAGNOSIS — C7951 Secondary malignant neoplasm of bone: Secondary | ICD-10-CM | POA: Insufficient documentation

## 2024-05-08 DIAGNOSIS — E861 Hypovolemia: Secondary | ICD-10-CM | POA: Diagnosis not present

## 2024-05-08 DIAGNOSIS — M545 Low back pain, unspecified: Secondary | ICD-10-CM | POA: Diagnosis not present

## 2024-05-08 DIAGNOSIS — C679 Malignant neoplasm of bladder, unspecified: Secondary | ICD-10-CM | POA: Diagnosis not present

## 2024-05-08 DIAGNOSIS — D649 Anemia, unspecified: Secondary | ICD-10-CM | POA: Insufficient documentation

## 2024-05-08 DIAGNOSIS — C779 Secondary and unspecified malignant neoplasm of lymph node, unspecified: Secondary | ICD-10-CM | POA: Insufficient documentation

## 2024-05-08 DIAGNOSIS — R634 Abnormal weight loss: Secondary | ICD-10-CM | POA: Diagnosis not present

## 2024-05-08 DIAGNOSIS — J189 Pneumonia, unspecified organism: Secondary | ICD-10-CM | POA: Insufficient documentation

## 2024-05-08 DIAGNOSIS — G893 Neoplasm related pain (acute) (chronic): Secondary | ICD-10-CM | POA: Diagnosis not present

## 2024-05-08 DIAGNOSIS — R339 Retention of urine, unspecified: Secondary | ICD-10-CM | POA: Diagnosis not present

## 2024-05-08 DIAGNOSIS — Z5112 Encounter for antineoplastic immunotherapy: Secondary | ICD-10-CM | POA: Insufficient documentation

## 2024-05-08 DIAGNOSIS — I959 Hypotension, unspecified: Secondary | ICD-10-CM | POA: Diagnosis not present

## 2024-05-08 DIAGNOSIS — Z5111 Encounter for antineoplastic chemotherapy: Secondary | ICD-10-CM | POA: Diagnosis present

## 2024-05-08 LAB — CBC WITH DIFFERENTIAL/PLATELET
Abs Immature Granulocytes: 0.03 K/uL (ref 0.00–0.07)
Basophils Absolute: 0.1 K/uL (ref 0.0–0.1)
Basophils Relative: 1 %
Eosinophils Absolute: 0.1 K/uL (ref 0.0–0.5)
Eosinophils Relative: 2 %
HCT: 27.9 % — ABNORMAL LOW (ref 39.0–52.0)
Hemoglobin: 10.3 g/dL — ABNORMAL LOW (ref 13.0–17.0)
Immature Granulocytes: 0 %
Lymphocytes Relative: 11 %
Lymphs Abs: 0.8 K/uL (ref 0.7–4.0)
MCH: 38.1 pg — ABNORMAL HIGH (ref 26.0–34.0)
MCHC: 36.9 g/dL — ABNORMAL HIGH (ref 30.0–36.0)
MCV: 103.3 fL — ABNORMAL HIGH (ref 80.0–100.0)
Monocytes Absolute: 0.8 K/uL (ref 0.1–1.0)
Monocytes Relative: 11 %
Neutro Abs: 5.4 K/uL (ref 1.7–7.7)
Neutrophils Relative %: 75 %
Platelets: 338 K/uL (ref 150–400)
RBC: 2.7 MIL/uL — ABNORMAL LOW (ref 4.22–5.81)
RDW: 21.8 % — ABNORMAL HIGH (ref 11.5–15.5)
Smear Review: NORMAL
WBC: 7.1 K/uL (ref 4.0–10.5)
nRBC: 0 % (ref 0.0–0.2)

## 2024-05-08 LAB — COMPREHENSIVE METABOLIC PANEL WITH GFR
ALT: 24 U/L (ref 0–44)
AST: 35 U/L (ref 15–41)
Albumin: 3.4 g/dL — ABNORMAL LOW (ref 3.5–5.0)
Alkaline Phosphatase: 182 U/L — ABNORMAL HIGH (ref 38–126)
Anion gap: 12 (ref 5–15)
BUN: 18 mg/dL (ref 8–23)
CO2: 25 mmol/L (ref 22–32)
Calcium: 9.2 mg/dL (ref 8.9–10.3)
Chloride: 98 mmol/L (ref 98–111)
Creatinine, Ser: 0.89 mg/dL (ref 0.61–1.24)
GFR, Estimated: >60 mL/min (ref >60)
Glucose, Bld: 122 mg/dL — ABNORMAL HIGH (ref 70–99)
Potassium: 3.7 mmol/L (ref 3.5–5.1)
Sodium: 135 mmol/L (ref 135–145)
Total Bilirubin: 0.4 mg/dL (ref 0.0–1.2)
Total Protein: 7.4 g/dL (ref 6.5–8.1)

## 2024-05-08 LAB — MAGNESIUM: Magnesium: 2 mg/dL (ref 1.7–2.4)

## 2024-05-08 MED ORDER — HYDROCODONE-ACETAMINOPHEN 5-325 MG PO TABS
1.0000 | ORAL_TABLET | Freq: Once | ORAL | Status: AC
Start: 2024-05-08 — End: 2024-05-08
  Administered 2024-05-08: 1 via ORAL
  Filled 2024-05-08: qty 1

## 2024-05-08 MED ORDER — SODIUM CHLORIDE 0.9 % IV SOLN: INTRAVENOUS | Status: DC

## 2024-05-08 NOTE — Patient Instructions (Signed)

## 2024-05-08 NOTE — Progress Notes (Signed)
 Patient tolerated hydration with no complaints voiced.  Port site clean and dry with good blood return noted before and after hydration.  No bruising or swelling noted with port.  Band aid applied.  VSS with discharge and left ambulatory with no s/s of distress noted.

## 2024-05-08 NOTE — Progress Notes (Signed)
 No treatment today per MD. Will give one liter of normal saline per MD orders

## 2024-05-08 NOTE — Patient Instructions (Signed)
 [facility] at [facility] Discharge Instructions   You were seen and examined today by Dr. Davonna.  She reviewed the results of your lab work which are normal/stable.   We will hold your treatment today.   Return as scheduled.    Thank you for choosing [facility] at [facility] to provide your oncology and hematology care.  To afford each patient quality time with our provider, please arrive at least 15 minutes before your scheduled appointment time.   If you have a lab appointment with the Cancer Center please come in thru the Main Entrance and check in at the main information desk.  You need to re-schedule your appointment should you arrive 10 or more minutes late.  We strive to give you quality time with our providers, and arriving late affects you and other patients whose appointments are after yours.  Also, if you no show three or more times for appointments you may be dismissed from the clinic at the providers discretion.     Again, thank you for choosing [facility].  Our hope is that these requests will decrease the amount of time that you wait before being seen by our physicians.       _____________________________________________________________  Should you have questions after your visit to [facility], please contact our office at [phone] and follow the prompts.  Our office hours are 8:00 a.m. and 4:30 p.m. Monday - Friday.  Please note that voicemails left after 4:00 p.m. may not be returned until the following business day.  We are closed weekends and major holidays.  You do have access to a nurse 24-7, just call the main number to the clinic [phone] and do not press any options, hold on the line and a nurse will answer the phone.    For prescription refill requests, have your pharmacy contact our office and allow 72 hours.    Due to Covid, you will need to wear a mask upon entering the  hospital. If you do not have a mask, a mask will be given to you at the Main Entrance upon arrival. For doctor visits, patients may have 1 support person age 53 or older with them. For treatment visits, patients can not have anyone with them due to social distancing guidelines and our immunocompromised population.

## 2024-05-14 NOTE — Progress Notes (Unsigned)
 Patient Care Team: Pcp, No as PCP - General Celestia Joesph SQUIBB, RN as Oncology Nurse Navigator (Medical Oncology) Davonna Siad, MD as Medical Oncologist (Medical Oncology)  Clinic Day:  ***  Primary medical oncologist: Davonna Siad, MD   CHIEF COMPLAINT:  CC: Metastatic urothelial carcinoma    ONCOLOGY HISTORY:   Diagnosis: Stage IV urothelial carcinoma of upper urinary tract or collecting duct   -Presentation: Patient seen in the ED on 03/19/2024 for right sided back, right flank, and right lower abdominal pain.  -03/19/2024: CT AP:  4.1 x 3.4 cm heterogeneous mass in the anterior interpolar left kidney appears to have enhancing central soft tissue nodules although this could represent blood clot within a pre-existing lesion. 16 mm potential enhancing nodule in the anterior right renal hilum. Imaging features are highly suspicious for renal cell carcinoma. Irregular centrally necrotic soft tissue mass in the retroperitoneal space just inferior to the SMA and obscuring the left renal vein, probably occluding/obliterating the left renal venous anatomy. 4.8 x 2.7 cm left periaortic nodal mass with multiple nodules in the left renal hilum and soft tissue encasing the left renal artery. Additional 5.2 x 4.9 cm soft tissue mass in the left abdomen. Borderline enlarged lymph nodes are seen at the aortic bifurcation. No pelvic sidewall lymphadenopathy. Numerous new pulmonary nodules in the lung bases measuring up to 6 mm. Imaging features are highly suspicious for metastatic disease. 14 mm short axis left retrocrural lymph node. Lytic lesions in the L1 and L2 vertebral bodies and posterior right ischial tuberosity, consistent with metastatic disease. -03/19/2024: CT chest: Thoracic nodal and pulmonary parenchymal metastatic disease.  -03/27/2024: Left retroperitoneal mass biopsy.  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  -Immunohistochemistry stains show the neoplasm is positive for cytokeratin AE13, ck8/18, high molecular weight cytokeratin (focal), gata 3, pax8, vimentin. Negative staining for ca-ix, ck7, ck20, cdx2, cd117, nkx3.1. the staining pattern favor metastatic urothelial carcinoma.  -03/30/2024: Initial PET: Left renal hypermetabolic masses consistent with malignancy with associated multiple hypermetabolic retroperitoneal, mediastinal and supraclavicular metastatic lymphadenopathy , osseous, pulmonary and peritoneal metastasis. -04/12/2024: [facility] NGS: MSI-stable, TMB: Low, 2 mut/Mb, PD-L1: Negative  - BRAF, NTRK 1/2/3, RET, ATM, BRCA1/2, HER2, FGFR 1/2/3: Negative  Current Treatment:  Enfortumab Vedotin + Pembrolizumab  INTERVAL HISTORY:   Dean Randolph is here today for follow up for urothelial carcinoma.  ***Patient is accompanied by his daughter today.   Patient was seen by Dr. Davonna on 05/08/2024, but treatment was HELD at that time due to hypotension, weakness, and general malaise following hospital admission from 05/01/2024 to 05/04/2024 (treated for sepsis secondary to UTI or pneumonia).  Instead, he was given IV fluids in clinic and is scheduled to follow-up in 1 week.  He has completed antibiotics.  ***  He returns today for reevaluation and to consider treatment today.  *** ***Weakness?  Able to ambulate? ***Cough/SOB ***Eating and drinking okay? ***Bone pain, pelvic pain, urinary issues?  We discussed holding ***treatment today for weakness and giving IV fluids.  ***Patient is in agreement.   ASSESSMENT & PLAN:   Assessment & Plan: LADAINIAN THERIEN  is a 83 y.o. male with Metastatic urothelial carcinoma   Assessment & Plan  Stage 4 bladder cancer with metastases to bone, lymph nodes Stage IV urothelial carcinoma of upper urinary tract or collecting duct Oncology history as above Path IHC  staining consistent with urothelial carcinoma  Started on EV + pembrolizumab on 04/17/2024   -Patient was admitted to the hospital for pneumonia and UTI on 05/02/2023 and discharged on 05/04/2024 - Patient is not feeling great today and is not very active.*** - Will hold treatment today for weakness and reassess in 1 week.***   Return to clinic in 1 week for reassessment and probable treatment.***  Hypotension Patient has a blood pressure of 104/79 with a heart rate of 115.***  - Will administer 1 L IV fluids today.***  Pneumonia Completed course of antibiotics, following hospitalization in November 2025. No respiratory symptoms today and is saturating 100% on room air***    Chronic back pain due to bone metastases Back pain from bone metastases secondary to bladder cancer. Prefers medication management over radiation. Percocet twice daily provides adequate pain control.*** Currently on Hydrocodone  10mg  every 6 hours but is not taking consistently***   - Continue pain management as above*** - Patient forgot pain medication this morning.  Will give a dose here.*** - Will consider adding bone modifying agents once patient is more stable.  This will need dental clearance as well.***   Anemia under evaluation The most likely cause of his anemia is due to chronic blood loss or malabsorption syndrome.  He may also have functional iron deficiency and anemia secondary to chronic disease, inflammation, malignancy. Lab Results  Component Value Date   IRON 15 (L) 02/04/2021   TIBC 264 02/04/2021   FERRITIN 275 02/04/2021  - Labs from 2022 showed low iron levels.  No updated labs are available at this time. - He was started on oral iron every other day in November 2025.  *** Use MiraLAX  for constipation. - Repeat CBC/D with iron panel in 8 weeks.  ***   ALLERGIES:  has no known allergies.  MEDICATIONS:  Current Outpatient Medications  Medication Sig Dispense Refill    Carboxymethylcell-Glycerin PF 0.5-0.9 % SOLN Place 2 drops into both eyes 4 (four) times daily. (Patient not taking: Reported on 05/01/2024) 1 each 11   famotidine (PEPCID) 20 MG tablet Take 1 tablet (20 mg total) by mouth at bedtime. 15 tablet 0   HYDROcodone -acetaminophen  (NORCO/VICODIN) 5-325 MG tablet Take 2 tablets by mouth every 6 (six) hours as needed for severe pain (pain score 7-10). 240 tablet 0   lidocaine -prilocaine (EMLA) cream Apply to affected area once (Patient taking differently: Apply 1 Application topically once. Apply to affected area once for port access) 30 g 3   ondansetron  (ZOFRAN ) 8 MG tablet Take 1 tablet (8 mg total) by mouth every 8 (eight) hours as needed for nausea or vomiting. 30 tablet 1   prochlorperazine (COMPAZINE) 10 MG tablet Take 1 tablet (10 mg total) by mouth every 6 (six) hours as needed for nausea or vomiting. 30 tablet 1   No current facility-administered medications for this visit.    REVIEW OF SYSTEMS:***   Constitutional: Denies fevers, chills or abnormal weight loss Eyes: Denies blurriness of vision Ears, nose, mouth, throat, and face: Denies mucositis or sore throat Respiratory: Denies cough, dyspnea or wheezes Cardiovascular: Denies palpitation, chest discomfort or lower extremity swelling Gastrointestinal:  Denies nausea, heartburn or change in bowel habits Skin: Denies abnormal skin rashes Lymphatics: Denies new lymphadenopathy or easy bruising Neurological:Denies numbness, tingling or new weaknesses Behavioral/Psych: Mood is stable, no new changes  All other systems were reviewed with the patient and are negative.   VITALS:***  There were no vitals taken for this visit.  Wt Readings from Last 3 Encounters:  05/08/24 159 lb 14.4 oz (72.5 kg)  05/01/24 153 lb (69.4 kg)  04/25/24 156 lb (70.8 kg)    There is no height or weight on file to calculate BMI.  Performance status (ECOG): 1 - Symptomatic but completely ambulatory  PHYSICAL  EXAM:***   GENERAL: Frail looking. SKIN: skin color, texture, turgor are normal, no rashes or significant lesions EYES: normal, Conjunctiva are pink and non-injected, sclera clear OROPHARYNX:no exudate, no erythema and lips, buccal mucosa, and tongue normal  LYMPH:  no palpable lymphadenopathy in the cervical, axillary or inguinal LUNGS: clear to auscultation and percussion with normal breathing effort HEART: regular rate & rhythm and no murmurs and no lower extremity edema ABDOMEN:abdomen soft, non-tender and normal bowel sounds Musculoskeletal:no cyanosis of digits and no clubbing  NEURO: alert & oriented x 3 with fluent speech  LABORATORY DATA:  I have reviewed the data as listed     Component Value Date/Time   NA 135 05/08/2024 0758   NA 135 12/02/2020 1129   K 3.7 05/08/2024 0758   CL 98 05/08/2024 0758   CO2 25 05/08/2024 0758   GLUCOSE 122 (H) 05/08/2024 0758   BUN 18 05/08/2024 0758   BUN 20 12/02/2020 1129   CREATININE 0.89 05/08/2024 0758   CALCIUM 9.2 05/08/2024 0758   PROT 7.4 05/08/2024 0758   PROT 7.7 12/11/2020 1602   ALBUMIN 3.4 (L) 05/08/2024 0758   ALBUMIN 3.7 12/11/2020 1602   AST 35 05/08/2024 0758   ALT 24 05/08/2024 0758   ALKPHOS 182 (H) 05/08/2024 0758   BILITOT 0.4 05/08/2024 0758   BILITOT 1.6 (H) 12/11/2020 1602   GFRNONAA >60 05/08/2024 0758     Lab Results  Component Value Date   WBC 7.1 05/08/2024   NEUTROABS 5.4 05/08/2024   HGB 10.3 (L) 05/08/2024   HCT 27.9 (L) 05/08/2024   MCV 103.3 (H) 05/08/2024   PLT 338 05/08/2024      Chemistry      Component Value Date/Time   NA 135 05/08/2024 0758   NA 135 12/02/2020 1129   K 3.7 05/08/2024 0758   CL 98 05/08/2024 0758   CO2 25 05/08/2024 0758   BUN 18 05/08/2024 0758   BUN 20 12/02/2020 1129   CREATININE 0.89 05/08/2024 0758      Component Value Date/Time   CALCIUM 9.2 05/08/2024 0758   ALKPHOS 182 (H) 05/08/2024 0758   AST 35 05/08/2024 0758   ALT 24 05/08/2024 0758    BILITOT 0.4 05/08/2024 0758   BILITOT 1.6 (H) 12/11/2020 1602        RADIOGRAPHIC STUDIES: I have personally reviewed the radiological images as listed and agreed with the findings in the report.  DG Chest Portable 1 View EXAM: 1 VIEW(S) XRAY OF THE CHEST 05/01/2024 11:44:12 AM  COMPARISON: PET CT 03/30/2024 and CT chest 03/19/2024  CLINICAL HISTORY: productive cough  FINDINGS:  LUNGS AND PLEURA: Bilateral interstitial prominence. Patchy airspace opacity in right upper lung, possibly representing pneumonia or pulmonary nodule. Bronchitic changes. No pulmonary edema. No pleural effusion. No pneumothorax.  HEART AND MEDIASTINUM: Aortic atherosclerosis. No acute abnormality of the cardiac and mediastinal silhouettes.  BONES AND SOFT TISSUES: Diffuse osteopenia.  IMPRESSION: 1. Patchy airspace opacity in the right upper lung, suspicious for pneumonia versus pulmonary nodule (in the setting of known metastatic renal cell carcinoma).  Electronically signed by: Waddell Calk MD 05/01/2024 01:20 PM EST RP Workstation: [id number]    PLAN SUMMARY & DISPOSITION:  All questions were answered. The  patient knows to call the clinic with any problems, questions or concerns.  Medical decision making: ***  Time spent on visit: I spent {CHL ONC TIME VISIT - DTPQU:[id number]} counseling the patient face to face. The total time spent in the appointment was {CHL ONC TIME VISIT - DTPQU:[id number]} and more than 50% was on counseling.   Pleasant CHRISTELLA Barefoot, PA-C  ***

## 2024-05-15 ENCOUNTER — Inpatient Hospital Stay

## 2024-05-15 ENCOUNTER — Inpatient Hospital Stay (HOSPITAL_BASED_OUTPATIENT_CLINIC_OR_DEPARTMENT_OTHER): Admitting: Oncology

## 2024-05-15 ENCOUNTER — Inpatient Hospital Stay: Admitting: Dietician

## 2024-05-15 VITALS — BP 109/75 | HR 123 | Temp 97.7°F | Resp 19 | Wt 144.0 lb

## 2024-05-15 VITALS — BP 110/59 | HR 80 | Temp 98.0°F | Resp 18

## 2024-05-15 DIAGNOSIS — M545 Low back pain, unspecified: Secondary | ICD-10-CM | POA: Diagnosis not present

## 2024-05-15 DIAGNOSIS — D509 Iron deficiency anemia, unspecified: Secondary | ICD-10-CM

## 2024-05-15 DIAGNOSIS — Z5111 Encounter for antineoplastic chemotherapy: Secondary | ICD-10-CM | POA: Diagnosis not present

## 2024-05-15 DIAGNOSIS — E861 Hypovolemia: Secondary | ICD-10-CM

## 2024-05-15 DIAGNOSIS — C689 Malignant neoplasm of urinary organ, unspecified: Secondary | ICD-10-CM

## 2024-05-15 LAB — CBC WITH DIFFERENTIAL/PLATELET
Abs Immature Granulocytes: 0.05 K/uL (ref 0.00–0.07)
Basophils Absolute: 0.1 K/uL (ref 0.0–0.1)
Basophils Relative: 1 %
Eosinophils Absolute: 0 K/uL (ref 0.0–0.5)
Eosinophils Relative: 0 %
HCT: 31.2 % — ABNORMAL LOW (ref 39.0–52.0)
Hemoglobin: 10.6 g/dL — ABNORMAL LOW (ref 13.0–17.0)
Immature Granulocytes: 1 %
Lymphocytes Relative: 10 %
Lymphs Abs: 1 K/uL (ref 0.7–4.0)
MCH: 34 pg (ref 26.0–34.0)
MCHC: 34 g/dL (ref 30.0–36.0)
MCV: 100 fL (ref 80.0–100.0)
Monocytes Absolute: 0.8 K/uL (ref 0.1–1.0)
Monocytes Relative: 8 %
Neutro Abs: 8.4 K/uL — ABNORMAL HIGH (ref 1.7–7.7)
Neutrophils Relative %: 80 %
Platelets: 431 K/uL — ABNORMAL HIGH (ref 150–400)
RBC: 3.12 MIL/uL — ABNORMAL LOW (ref 4.22–5.81)
RDW: 19.4 % — ABNORMAL HIGH (ref 11.5–15.5)
WBC: 10.3 K/uL (ref 4.0–10.5)
nRBC: 0 % (ref 0.0–0.2)

## 2024-05-15 LAB — COMPREHENSIVE METABOLIC PANEL WITH GFR
ALT: 12 U/L (ref 0–44)
AST: 28 U/L (ref 15–41)
Albumin: 3.6 g/dL (ref 3.5–5.0)
Alkaline Phosphatase: 169 U/L — ABNORMAL HIGH (ref 38–126)
Anion gap: 13 (ref 5–15)
BUN: 26 mg/dL — ABNORMAL HIGH (ref 8–23)
CO2: 23 mmol/L (ref 22–32)
Calcium: 9.6 mg/dL (ref 8.9–10.3)
Chloride: 99 mmol/L (ref 98–111)
Creatinine, Ser: 1.03 mg/dL (ref 0.61–1.24)
GFR, Estimated: >60 mL/min (ref >60)
Glucose, Bld: 136 mg/dL — ABNORMAL HIGH (ref 70–99)
Potassium: 4.4 mmol/L (ref 3.5–5.1)
Sodium: 135 mmol/L (ref 135–145)
Total Bilirubin: 0.4 mg/dL (ref 0.0–1.2)
Total Protein: 7.7 g/dL (ref 6.5–8.1)

## 2024-05-15 LAB — MAGNESIUM: Magnesium: 2.3 mg/dL (ref 1.7–2.4)

## 2024-05-15 MED ORDER — SODIUM CHLORIDE 0.9 % IV SOLN: INTRAVENOUS | Status: DC

## 2024-05-15 MED ORDER — SODIUM CHLORIDE 0.9 % IV SOLN
1.0000 mg/kg | Freq: Once | INTRAVENOUS | Status: AC
  Administered 2024-05-15: 70 mg via INTRAVENOUS
  Filled 2024-05-15: qty 7

## 2024-05-15 MED ORDER — ACETAMINOPHEN 325 MG PO TABS
650.0000 mg | ORAL_TABLET | Freq: Once | ORAL | Status: AC
  Administered 2024-05-15: 650 mg via ORAL
  Filled 2024-05-15: qty 2

## 2024-05-15 MED ORDER — HYDROMORPHONE HCL 1 MG/ML IJ SOLN
1.0000 mg | Freq: Once | INTRAMUSCULAR | Status: AC
  Administered 2024-05-15: 1 mg via INTRAVENOUS
  Filled 2024-05-15: qty 1

## 2024-05-15 MED ORDER — SODIUM CHLORIDE 0.9 % IV SOLN
1000.0000 mg | Freq: Once | INTRAVENOUS | Status: AC
  Administered 2024-05-15: 1000 mg via INTRAVENOUS
  Filled 2024-05-15: qty 1000

## 2024-05-15 MED ORDER — FAMOTIDINE IN NACL 20-0.9 MG/50ML-% IV SOLN
20.0000 mg | Freq: Once | INTRAVENOUS | Status: AC
  Administered 2024-05-15: 20 mg via INTRAVENOUS
  Filled 2024-05-15: qty 50

## 2024-05-15 MED ORDER — PROCHLORPERAZINE MALEATE 10 MG PO TABS
10.0000 mg | ORAL_TABLET | Freq: Once | ORAL | Status: AC
  Administered 2024-05-15: 10 mg via ORAL
  Filled 2024-05-15: qty 1

## 2024-05-15 MED ORDER — MIRTAZAPINE 15 MG PO TABS: 15.0000 mg | ORAL_TABLET | Freq: Every day | ORAL | 1 refills | Status: DC

## 2024-05-15 MED ORDER — SODIUM CHLORIDE 0.9 % IV SOLN
200.0000 mg | Freq: Once | INTRAVENOUS | Status: AC
  Administered 2024-05-15: 200 mg via INTRAVENOUS
  Filled 2024-05-15: qty 8

## 2024-05-15 MED ORDER — CETIRIZINE HCL 10 MG/ML IV SOLN
5.0000 mg | Freq: Once | INTRAVENOUS | Status: AC
  Administered 2024-05-15: 5 mg via INTRAVENOUS
  Filled 2024-05-15: qty 1

## 2024-05-15 NOTE — Patient Instructions (Signed)
 [facility] - A DEPT OF [facility]  Discharge Instructions: Thank you for choosing [facility] to provide your oncology and hematology care.  If you have a lab appointment with the Cancer Center - please note that after April 8th, 2024, all labs will be drawn in the cancer center.  You do not have to check in or register with the main entrance as you have in the past but will complete your check-in in the cancer center.  Wear comfortable clothing and clothing appropriate for easy access to any Portacath or PICC line.   We strive to give you quality time with your provider. You may need to reschedule your appointment if you arrive late (15 or more minutes).  Arriving late affects you and other patients whose appointments are after yours.  Also, if you miss three or more appointments without notifying the office, you may be dismissed from the clinic at the provider's discretion.      For prescription refill requests, have your pharmacy contact our office and allow 72 hours for refills to be completed.    Today you received the following chemotherapy and/or immunotherapy agents monoferric padcev,keytruda      To help prevent nausea and vomiting after your treatment, we encourage you to take your nausea medication as directed.  BELOW ARE SYMPTOMS THAT SHOULD BE REPORTED IMMEDIATELY: *FEVER GREATER THAN 100.4 F (38 C) OR HIGHER *CHILLS OR SWEATING *NAUSEA AND VOMITING THAT IS NOT CONTROLLED WITH YOUR NAUSEA MEDICATION *UNUSUAL SHORTNESS OF BREATH *UNUSUAL BRUISING OR BLEEDING *URINARY PROBLEMS (pain or burning when urinating, or frequent urination) *BOWEL PROBLEMS (unusual diarrhea, constipation, pain near the anus) TENDERNESS IN MOUTH AND THROAT WITH OR WITHOUT PRESENCE OF ULCERS (sore throat, sores in mouth, or a toothache) UNUSUAL RASH, SWELLING OR PAIN  UNUSUAL VAGINAL DISCHARGE OR ITCHING   Items with * indicate a potential emergency and  should be followed up as soon as possible or go to the Emergency Department if any problems should occur.  Please show the CHEMOTHERAPY ALERT CARD or IMMUNOTHERAPY ALERT CARD at check-in to the Emergency Department and triage nurse.  Should you have questions after your visit or need to cancel or reschedule your appointment, please contact [facility] - A DEPT OF [facility] [phone]  and follow the prompts.  Office hours are 8:00 a.m. to 4:30 p.m. Monday - Friday. Please note that voicemails left after 4:00 p.m. may not be returned until the following business day.  We are closed weekends and major holidays. You have access to a nurse at all times for urgent questions. Please call the main number to the clinic [phone] and follow the prompts.  For any non-urgent questions, you may also contact your provider using MyChart. We now offer e-Visits for anyone 18 and older to request care online for non-urgent symptoms. For details visit [email or web address].   Also download the MyChart app! Go to the app store, search MyChart, open the app, select [place], and log in with your MyChart username and password.

## 2024-05-15 NOTE — Progress Notes (Unsigned)
 Patient Care Team: Pcp, No as PCP - General Celestia Joesph SQUIBB, RN as Oncology Nurse Navigator (Medical Oncology) Davonna Siad, MD as Medical Oncologist (Medical Oncology)  Clinic Day:  05/15/2024  Referring physician: Davonna Siad, MD   CHIEF COMPLAINT:  CC: Metastatic urothelial carcinoma    ASSESSMENT & PLAN:   Assessment & Plan: Dean Randolph  is a 83 y.o. male with Metastatic urothelial carcinoma   Assessment and Plan Assessment & Plan  Stage 4 bladder cancer with metastases to bone, lymph nodes Stage IV urothelial carcinoma of upper urinary tract or collecting duct Oncology history below Path IHC staining consistent with urothelial carcinoma Started on EV + pembrolizumab on 04/17/2024.   - Second cycle was held recently for deconditioning from hospital admission secondary to pneumonia, UTI and sepsis. - Patient has improved from last week and is feeling better. - Will dose reduce enfortumab vedotin to 1 mg/kg as patient has been losing weight and his functional status has been declining. - Will continue pembrolizumab at the same dose of 200 mg every 3 weeks - Will repeat PET scan after completion of 3 cycles of treatment.  Return to clinic in prior to next cycle of treatment  Hypotension Patient has a blood pressure of 109/80mmHg with a heart rate of 116.  - Will administer 1 L IV fluids today.    Chronic back pain due to bone metastases Back pain from bone metastases secondary to bladder cancer. Prefers medication management over radiation. Percocet twice daily provides adequate pain control. Currently on Hydrocodone  10mg  every 6 hours but is not taking consistently   - Continue pain management as above -Patient is not comfortable sitting in a wheelchair and reports back pain worsening from physical therapy.  Will administer 1 mg Dilaudid  in clinic today. - Will refer to radiation oncology for palliative radiation to the vertebral lesions for pain  control - Will consider adding bone modifying agents once patient is more stable.  This will need dental clearance as well.   Anemia under evaluation The most likely cause of his anemia is due to chronic blood loss or malabsorption syndrome. Lab Results  Component Value Date   IRON 15 (L) 02/04/2021   TIBC 264 02/04/2021   FERRITIN 275 02/04/2021     - We discussed starting IV iron at last visit but it was pending.  Will administer an infusion today.  Repeat labs in 2 months.  Urinary retention with indwelling catheter Managed with indwelling catheter. Recent urology intervention for catheter issues. - Continue follow-up with urologist for catheter management.  Malnutrition and weight loss with poor appetite Significant weight loss from 159 to 144 pounds. Poor appetite and minimal intake. Discussed appetite stimulant to improve nutritional status and mood.  -Will start mirtazapine at 15mg  at bedtime    The patient understands the plans discussed today and is in agreement with them.  He knows to contact our office if he develops concerns prior to his next appointment.  The total time spent in the appointment was 22 minutes for the encounter with patient, including review of chart and various tests results, discussions about plan of care and coordination of care plan   Siad Davonna, MD  [facility] [place] - A DEPT OF [facility] [address] [place] [address] Dept: [phone] Dept Fax: [phone]   No orders of the defined types were placed in this encounter.    ONCOLOGY HISTORY:  Diagnosis: Stage IV urothelial carcinoma of upper urinary tract or collecting duct   -Presentation: Patient seen in the ED on 03/19/2024 for right sided back, right flank, and right lower abdominal pain.  -03/19/2024: CT AP:  4.1 x 3.4 cm heterogeneous mass in the anterior interpolar left kidney appears to have enhancing  central soft tissue nodules although this could represent blood clot within a pre-existing lesion. 16 mm potential enhancing nodule in the anterior right renal hilum. Imaging features are highly suspicious for renal cell carcinoma. Irregular centrally necrotic soft tissue mass in the retroperitoneal space just inferior to the SMA and obscuring the left renal vein, probably occluding/obliterating the left renal venous anatomy. 4.8 x 2.7 cm left periaortic nodal mass with multiple nodules in the left renal hilum and soft tissue encasing the left renal artery. Additional 5.2 x 4.9 cm soft tissue mass in the left abdomen. Borderline enlarged lymph nodes are seen at the aortic bifurcation. No pelvic sidewall lymphadenopathy. Numerous new pulmonary nodules in the lung bases measuring up to 6 mm. Imaging features are highly suspicious for metastatic disease. 14 mm short axis left retrocrural lymph node. Lytic lesions in the L1 and L2 vertebral bodies and posterior right ischial tuberosity, consistent with metastatic disease. -03/19/2024: CT chest: Thoracic nodal and pulmonary parenchymal metastatic disease.  -03/27/2024: Left retroperitoneal mass biopsy.  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  -Immunohistochemistry stains show the neoplasm is positive for cytokeratin AE13, ck8/18, high molecular weight cytokeratin (focal), gata 3, pax8, vimentin. Negative staining for ca-ix, ck7, ck20, cdx2, cd117, nkx3.1. the staining pattern favor metastatic urothelial carcinoma.  -03/30/2024: Initial PET: Left renal hypermetabolic masses consistent with malignancy with associated multiple hypermetabolic retroperitoneal, mediastinal and supraclavicular metastatic lymphadenopathy , osseous, pulmonary and peritoneal metastasis. -04/12/2024: [facility] NGS: MSI-stable, TMB: Low, 2  mut/Mb, PD-L1: Negative  - BRAF, NTRK 1/2/3, RET, ATM, BRCA1/2, HER2, FGFR 1/2/3: Negative -03/2024-Current: Enfortumab Vedotin + Pembrolizumab  -Cycle 2 delayed for hospital admission for pneumonia and sepsis  Current Treatment:  Enfortumab Vedotin + Pembrolizumab  INTERVAL HISTORY:   Discussed the use of AI scribe software for clinical note transcription with the patient, who gave verbal consent to proceed.  History of Present Illness Dean Randolph is an 83 year old male who presents for follow-up and systemic therapy for urothelial carcinoma.  He is accompanied by his daughter today.  He has been experiencing persistent back pain since last Friday, which makes it difficult for him to get up. He takes two pain medication pills in the morning, but it takes time for the medication to take effect. Despite the pain, he manages to walk daily, although he sometimes requires assistance. He recently spent time outside with family while he did yard work.  He has experienced significant weight loss, dropping from 159 pounds to 144 pounds. His appetite is poor, and he consumes very small amounts of food, such as half a bowl of oatmeal and part of a fruit bar for breakfast. He is currently using Ensure to supplement his nutrition. His family is concerned about his nutritional intake, noting that he is eating only 'little bits.'  He has a history of pneumonia and is currently experiencing ongoing congestion and lung changes. He is also experiencing hair loss.  He has been using a catheter and recently underwent testing at [facility] to assess his ability to release urine on his own. He is  on medication to help relax his muscles, and further testing is scheduled for Friday to determine if the catheter can be removed.    I have reviewed the past medical history, past surgical history, social history and family history with the patient and they are unchanged from previous note.  ALLERGIES:  has no known  allergies.  MEDICATIONS:  Current Outpatient Medications  Medication Sig Dispense Refill   Carboxymethylcell-Glycerin PF 0.5-0.9 % SOLN Place 2 drops into both eyes 4 (four) times daily. 1 each 11   famotidine (PEPCID) 20 MG tablet Take 1 tablet (20 mg total) by mouth at bedtime. 15 tablet 0   HYDROcodone -acetaminophen  (NORCO/VICODIN) 5-325 MG tablet Take 2 tablets by mouth every 6 (six) hours as needed for severe pain (pain score 7-10). 240 tablet 0   lidocaine -prilocaine (EMLA) cream Apply to affected area once (Patient taking differently: Apply 1 Application topically once. Apply to affected area once for port access) 30 g 3   mirtazapine (REMERON) 15 MG tablet Take 1 tablet (15 mg total) by mouth at bedtime. 30 tablet 1   ondansetron  (ZOFRAN ) 8 MG tablet Take 1 tablet (8 mg total) by mouth every 8 (eight) hours as needed for nausea or vomiting. 30 tablet 1   prochlorperazine (COMPAZINE) 10 MG tablet Take 1 tablet (10 mg total) by mouth every 6 (six) hours as needed for nausea or vomiting. 30 tablet 1   tamsulosin (FLOMAX) 0.4 MG CAPS capsule Take by mouth.     No current facility-administered medications for this visit.    REVIEW OF SYSTEMS:   Constitutional: Denies fevers, chills. + abnormal weight loss Eyes: Denies blurriness of vision Ears, nose, mouth, throat, and face: Denies mucositis or sore throat Respiratory: Denies cough, dyspnea or wheezes Cardiovascular: Denies palpitation, chest discomfort or lower extremity swelling Gastrointestinal:  Denies nausea, heartburn or change in bowel habits Skin: Denies abnormal skin rashes Lymphatics: Denies new lymphadenopathy or easy bruising Neurological:Denies numbness, tingling. + weakness Behavioral/Psych: Mood is stable, no new changes   All other systems were reviewed with the patient and are negative.   VITALS:  Blood pressure 109/75, pulse (!) 123, temperature 97.7 F (36.5 C), temperature source Tympanic, resp. rate 19,  weight 144 lb (65.3 kg), SpO2 97%.  Wt Readings from Last 3 Encounters:  05/15/24 144 lb (65.3 kg)  05/08/24 159 lb 14.4 oz (72.5 kg)  05/01/24 153 lb (69.4 kg)    Body mass index is 21.27 kg/m.  Performance status (ECOG): 2 - Symptomatic, <50% confined to bed  PHYSICAL EXAM:   GENERAL: Frail looking male in back pain EYES: normal, Conjunctiva are pink and non-injected, sclera clear LYMPH:  no palpable lymphadenopathy in the cervical, axillary or inguinal LUNGS: clear to auscultation and percussion with normal breathing effort HEART: regular rate & rhythm and no murmurs and no lower extremity edema ABDOMEN:abdomen distended, bowel sounds + Musculoskeletal:no cyanosis of digits and no clubbing, urinary bag attached to right leg  NEURO: alert & oriented x 3 with fluent speech  LABORATORY DATA:  I have reviewed the data as listed     Component Value Date/Time   NA 135 05/15/2024 0756   NA 135 12/02/2020 1129   K 4.4 05/15/2024 0756   CL 99 05/15/2024 0756   CO2 23 05/15/2024 0756   GLUCOSE 136 (H) 05/15/2024 0756   BUN 26 (H) 05/15/2024 0756   BUN 20 12/02/2020 1129   CREATININE 1.03 05/15/2024 0756   CALCIUM 9.6 05/15/2024 0756   PROT 7.7  05/15/2024 0756   PROT 7.7 12/11/2020 1602   ALBUMIN 3.6 05/15/2024 0756   ALBUMIN 3.7 12/11/2020 1602   AST 28 05/15/2024 0756   ALT 12 05/15/2024 0756   ALKPHOS 169 (H) 05/15/2024 0756   BILITOT 0.4 05/15/2024 0756   BILITOT 1.6 (H) 12/11/2020 1602   GFRNONAA >60 05/15/2024 0756     Lab Results  Component Value Date   WBC 10.3 05/15/2024   NEUTROABS 8.4 (H) 05/15/2024   HGB 10.6 (L) 05/15/2024   HCT 31.2 (L) 05/15/2024   MCV 100.0 05/15/2024   PLT 431 (H) 05/15/2024      Chemistry      Component Value Date/Time   NA 135 05/15/2024 0756   NA 135 12/02/2020 1129   K 4.4 05/15/2024 0756   CL 99 05/15/2024 0756   CO2 23 05/15/2024 0756   BUN 26 (H) 05/15/2024 0756   BUN 20 12/02/2020 1129   CREATININE 1.03 05/15/2024  0756      Component Value Date/Time   CALCIUM 9.6 05/15/2024 0756   ALKPHOS 169 (H) 05/15/2024 0756   AST 28 05/15/2024 0756   ALT 12 05/15/2024 0756   BILITOT 0.4 05/15/2024 0756   BILITOT 1.6 (H) 12/11/2020 1602        RADIOGRAPHIC STUDIES: I have personally reviewed the radiological images as listed and agreed with the findings in the report.  DG Chest Portable 1 View EXAM: 1 VIEW(S) XRAY OF THE CHEST 05/01/2024 11:44:12 AM  COMPARISON: PET CT 03/30/2024 and CT chest 03/19/2024  CLINICAL HISTORY: productive cough  FINDINGS:  LUNGS AND PLEURA: Bilateral interstitial prominence. Patchy airspace opacity in right upper lung, possibly representing pneumonia or pulmonary nodule. Bronchitic changes. No pulmonary edema. No pleural effusion. No pneumothorax.  HEART AND MEDIASTINUM: Aortic atherosclerosis. No acute abnormality of the cardiac and mediastinal silhouettes.  BONES AND SOFT TISSUES: Diffuse osteopenia.  IMPRESSION: 1. Patchy airspace opacity in the right upper lung, suspicious for pneumonia versus pulmonary nodule (in the setting of known metastatic renal cell carcinoma).  Electronically signed by: Waddell Calk MD 05/01/2024 01:20 PM EST RP Workstation: [id number]

## 2024-05-15 NOTE — Progress Notes (Signed)
 Orders from Dr Davonna to decrease dose of Padcev to 1 mg/kg due to intolerance.  Add Monoferric 1000 mg IVPB x 1 to today's treatment with premeds.  Plan updated.  Niels Molt, PharmD Clinical Pharmacist [facility]

## 2024-05-15 NOTE — Progress Notes (Unsigned)
 Patient has been examined by Dr. Davonna. Vital signs (HR 116) and labs have been reviewed by MD - ANC, Creatinine, LFTs, hemoglobin, and platelets have been reviewed by M.D. - pt may proceed with treatment.  Primary RN and pharmacy notified.

## 2024-05-15 NOTE — Progress Notes (Signed)
 Nutrition Follow-up:  Pt with urothelial carcinoma. He is receiving padcev + keytruda q21d (first padcev 10/27). Patient is under the care of Dr. Davonna.   11/3-11/6 admission with sepsis due to pna  Met with patient in infusion. He received IV pain medication and is sleepy at visit. Patient reports he still isn't doing too good since hospital discharge. Appetite is poor. Patient reports drinking Boost Plus 3x/day. Sometimes leaves a couple swallows in the bottle. He is unable to provide recall what he ate yesterday. Patient closing eyes and requesting a warm blanket.   Medications: reviewed   Labs: glucose 136, BUN 26  Anthropometrics: Wt 144 lb today decreased 7.7% in 4 weeks which is severe for time frame  11/10 - 159 lb 14.4 oz (? Accuracy - per daughter pt not weighed this day)  10/27 - 156 lb  9/25 - 164 lb 3.9 oz    NUTRITION DIAGNOSIS: Unintended wt loss - ongoing    INTERVENTION:  Continue drinking 3 Boost Plus/equivalent - recommend consuming in between meals Provided warm blanket      MONITORING, EVALUATION, GOAL: wt trends, intake   NEXT VISIT: Monday December 8 during infusion

## 2024-05-15 NOTE — Patient Instructions (Signed)

## 2024-05-15 NOTE — Progress Notes (Signed)
 Patient presents today for Padcev/Keytruda and Monoferric infusions per providers order.  Vital signs and labs reviewed by MD.  Message received from Isaiah Piety RN/Dr. Davonna patient okay for treatment.  Patient to also receive 1 liter of saline per providers order.   Peripheral IV started and blood return noted pre and post infusion.  Treatment given today per MD orders.  Stable during infusion without adverse affects.  Vital signs stable.  No complaints at this time.  Discharge from clinic via wheelchair in stable condition.  Alert and oriented X 3.  Follow up with [facility] as scheduled.

## 2024-05-17 ENCOUNTER — Encounter: Payer: Self-pay | Admitting: Oncology

## 2024-05-19 ENCOUNTER — Other Ambulatory Visit: Payer: Self-pay | Admitting: Oncology

## 2024-05-21 ENCOUNTER — Encounter: Payer: Self-pay | Admitting: Oncology

## 2024-05-21 ENCOUNTER — Emergency Department (HOSPITAL_COMMUNITY): Admission: EM | Admit: 2024-05-21 | Discharge: 2024-05-22 | Disposition: A | Discharge status: Home / Self Care

## 2024-05-21 ENCOUNTER — Other Ambulatory Visit: Payer: Self-pay

## 2024-05-21 ENCOUNTER — Emergency Department (HOSPITAL_COMMUNITY)

## 2024-05-21 ENCOUNTER — Encounter (HOSPITAL_COMMUNITY): Payer: Self-pay | Admitting: Emergency Medicine

## 2024-05-21 DIAGNOSIS — C679 Malignant neoplasm of bladder, unspecified: Secondary | ICD-10-CM | POA: Diagnosis not present

## 2024-05-21 DIAGNOSIS — M545 Low back pain, unspecified: Secondary | ICD-10-CM | POA: Diagnosis not present

## 2024-05-21 DIAGNOSIS — M549 Dorsalgia, unspecified: Secondary | ICD-10-CM | POA: Diagnosis present

## 2024-05-21 DIAGNOSIS — N3000 Acute cystitis without hematuria: Secondary | ICD-10-CM

## 2024-05-21 DIAGNOSIS — N2889 Other specified disorders of kidney and ureter: Secondary | ICD-10-CM | POA: Insufficient documentation

## 2024-05-21 DIAGNOSIS — R590 Localized enlarged lymph nodes: Secondary | ICD-10-CM | POA: Insufficient documentation

## 2024-05-21 DIAGNOSIS — G8929 Other chronic pain: Secondary | ICD-10-CM | POA: Insufficient documentation

## 2024-05-21 DIAGNOSIS — C7951 Secondary malignant neoplasm of bone: Secondary | ICD-10-CM | POA: Diagnosis not present

## 2024-05-21 LAB — CBC WITH DIFFERENTIAL/PLATELET
Abs Immature Granulocytes: 0.05 K/uL (ref 0.00–0.07)
Basophils Absolute: 0 K/uL (ref 0.0–0.1)
Basophils Relative: 0 %
Eosinophils Absolute: 0 K/uL (ref 0.0–0.5)
Eosinophils Relative: 0 %
HCT: 28.8 % — ABNORMAL LOW (ref 39.0–52.0)
Hemoglobin: 9.4 g/dL — ABNORMAL LOW (ref 13.0–17.0)
Immature Granulocytes: 0 %
Lymphocytes Relative: 5 %
Lymphs Abs: 0.6 K/uL — ABNORMAL LOW (ref 0.7–4.0)
MCH: 31.6 pg (ref 26.0–34.0)
MCHC: 32.6 g/dL (ref 30.0–36.0)
MCV: 97 fL (ref 80.0–100.0)
Monocytes Absolute: 1.1 K/uL — ABNORMAL HIGH (ref 0.1–1.0)
Monocytes Relative: 9 %
Neutro Abs: 10.1 K/uL — ABNORMAL HIGH (ref 1.7–7.7)
Neutrophils Relative %: 86 %
Platelets: 280 K/uL (ref 150–400)
RBC: 2.97 MIL/uL — ABNORMAL LOW (ref 4.22–5.81)
RDW: 18.2 % — ABNORMAL HIGH (ref 11.5–15.5)
WBC: 11.9 K/uL — ABNORMAL HIGH (ref 4.0–10.5)
nRBC: 0 % (ref 0.0–0.2)

## 2024-05-21 LAB — COMPREHENSIVE METABOLIC PANEL WITH GFR
ALT: 15 U/L (ref 0–44)
AST: 28 U/L (ref 15–41)
Albumin: 3.1 g/dL — ABNORMAL LOW (ref 3.5–5.0)
Alkaline Phosphatase: 151 U/L — ABNORMAL HIGH (ref 38–126)
Anion gap: 12 (ref 5–15)
BUN: 35 mg/dL — ABNORMAL HIGH (ref 8–23)
CO2: 24 mmol/L (ref 22–32)
Calcium: 9.2 mg/dL (ref 8.9–10.3)
Chloride: 96 mmol/L — ABNORMAL LOW (ref 98–111)
Creatinine, Ser: 0.86 mg/dL (ref 0.61–1.24)
GFR, Estimated: >60 mL/min (ref >60)
Glucose, Bld: 140 mg/dL — ABNORMAL HIGH (ref 70–99)
Potassium: 3.8 mmol/L (ref 3.5–5.1)
Sodium: 132 mmol/L — ABNORMAL LOW (ref 135–145)
Total Bilirubin: 0.5 mg/dL (ref 0.0–1.2)
Total Protein: 7.2 g/dL (ref 6.5–8.1)

## 2024-05-21 LAB — URINALYSIS, ROUTINE W REFLEX MICROSCOPIC
Bilirubin Urine: NEGATIVE
Glucose, UA: NEGATIVE mg/dL
Ketones, ur: NEGATIVE mg/dL
Nitrite: NEGATIVE
Protein, ur: 100 mg/dL — AB
Specific Gravity, Urine: 1.024 (ref 1.005–1.030)
WBC, UA: >50 WBC/hpf (ref 0–5)
pH: 5 (ref 5.0–8.0)

## 2024-05-21 LAB — LIPASE, BLOOD: Lipase: 21 U/L (ref 11–51)

## 2024-05-21 MED ORDER — SODIUM CHLORIDE 0.9 % IV BOLUS
500.0000 mL | Freq: Once | INTRAVENOUS | Status: AC
  Administered 2024-05-22: 500 mL via INTRAVENOUS

## 2024-05-21 MED ORDER — IOHEXOL 350 MG/ML SOLN
75.0000 mL | Freq: Once | INTRAVENOUS | Status: AC | PRN
  Administered 2024-05-21: 75 mL via INTRAVENOUS

## 2024-05-21 NOTE — ED Triage Notes (Signed)
 Pt c/o right sided chest pain when he lifts his arm x 2 days. Pt took 2 adult aspirin this am. He is supposed to have cancer treatments in the am.

## 2024-05-21 NOTE — ED Provider Notes (Signed)
 [facility] AT [facility] Provider Note   CSN: [id number] Arrival date & time: 05/21/24  2132     Patient presents with: Chest Pain   Dean Randolph is a 83 y.o. male.    Chest Pain    Chief complaint says chest pain but patient denies all chest pain.  Patient has more right-sided back pain.  Worse with certain movements.  No exertional chest pain.  No midsternal chest pain.  No pleuritic chest pain no hemoptysis at this time.  No fever no chills.  Family states that they have been having a much harder time getting patient to eat and drink here recently.  Patient states he is not hungry or thirsty.  Patient states that he is got a very dry mouth.  No obvious fever or chills.  No nausea vomit diarrhea that he nor family endorses at this time.  No obvious dysuria.  It was concerned so brought him into the ED for further evaluation. Previous medical history reviewed : Last discharged in November 2025 in the setting of sepsis due to pneumonia.  Stage IV bladder cancer with metastatic disease to bone and lymph nodes.   Prior to Admission medications   Medication Sig Start Date End Date Taking? Authorizing Provider  Carboxymethylcell-Glycerin  PF 0.5-0.9 % SOLN Place 2 drops into both eyes 4 (four) times daily. 04/06/24   Kandala, Hyndavi, MD  famotidine  (PEPCID ) 20 MG tablet Take 1 tablet (20 mg total) by mouth at bedtime. 05/04/24   Rashid, Farhan, MD  HYDROcodone -acetaminophen  (NORCO/VICODIN) 5-325 MG tablet Take 2 tablets by mouth every 6 (six) hours as needed for severe pain (pain score 7-10). 04/12/24   Kandala, Hyndavi, MD  lidocaine -prilocaine  (EMLA ) cream Apply to affected area once Patient taking differently: Apply 1 Application topically once. Apply to affected area once for port access 04/06/24   Davonna Siad, MD  mirtazapine  (REMERON ) 15 MG tablet Take 1 tablet (15 mg total) by mouth at bedtime. 05/15/24   Davonna Siad, MD  ondansetron   (ZOFRAN ) 8 MG tablet Take 1 tablet (8 mg total) by mouth every 8 (eight) hours as needed for nausea or vomiting. 04/06/24   Kandala, Hyndavi, MD  prochlorperazine  (COMPAZINE ) 10 MG tablet Take 1 tablet (10 mg total) by mouth every 6 (six) hours as needed for nausea or vomiting. 04/06/24   Kandala, Hyndavi, MD  tamsulosin (FLOMAX) 0.4 MG CAPS capsule Take by mouth. 05/11/24   [provider]    Allergies: Patient has no known allergies.    Review of Systems  Cardiovascular:  Positive for chest pain.    Updated Vital Signs BP (!) 107/54   Pulse 93   Temp 98.1 F (36.7 C) (Oral)   Resp 19   Ht 5' 9 (1.753 m)   Wt 65.3 kg   SpO2 93%   BMI 21.26 kg/m   Physical Exam Vitals and nursing note reviewed.  Constitutional:      General: He is not in acute distress.    Appearance: He is well-developed.  HENT:     Head: Normocephalic and atraumatic.  Eyes:     Conjunctiva/sclera: Conjunctivae normal.  Cardiovascular:     Rate and Rhythm: Normal rate and regular rhythm.     Heart sounds: No murmur heard. Pulmonary:     Effort: Pulmonary effort is normal. No respiratory distress.     Breath sounds: Normal breath sounds.  Abdominal:     Palpations: Abdomen is soft.     Tenderness:  There is no abdominal tenderness.  Musculoskeletal:        General: No swelling.     Cervical back: Neck supple.  Skin:    General: Skin is warm and dry.     Capillary Refill: Capillary refill takes less than 2 seconds.  Neurological:     Mental Status: He is alert.  Psychiatric:        Mood and Affect: Mood normal.     (all labs ordered are listed, but only abnormal results are displayed) Labs Reviewed  CBC WITH DIFFERENTIAL/PLATELET - Abnormal; Notable for the following components:      Result Value   WBC 11.9 (*)    RBC 2.97 (*)    Hemoglobin 9.4 (*)    HCT 28.8 (*)    RDW 18.2 (*)    Neutro Abs 10.1 (*)    Lymphs Abs 0.6 (*)    Monocytes Absolute 1.1 (*)    All other components  within normal limits  COMPREHENSIVE METABOLIC PANEL WITH GFR - Abnormal; Notable for the following components:   Sodium 132 (*)    Chloride 96 (*)    Glucose, Bld 140 (*)    BUN 35 (*)    Albumin 3.1 (*)    Alkaline Phosphatase 151 (*)    All other components within normal limits  LIPASE, BLOOD  URINALYSIS, ROUTINE W REFLEX MICROSCOPIC    EKG: None  Radiology: DG Chest Portable 1 View Result Date: 05/21/2024 CLINICAL DATA:  Right-sided chest pain EXAM: PORTABLE CHEST 1 VIEW COMPARISON:  05/01/2024 FINDINGS: Single frontal view of the chest demonstrates an enlarged cardiac silhouette. Lung volumes are diminished, with increased central pulmonary vascular prominence. Increased density at the left lung base consistent with consolidation and/or effusion. No pneumothorax. No acute bony abnormalities. IMPRESSION: 1. Increased density at the left lung base consistent with consolidation and/or effusion. 2. Low lung volumes, with increased central pulmonary vascular congestion. 3. Enlarged cardiac silhouette. Electronically Signed   By: Ozell Daring M.D.   On: 05/21/2024 22:26     Procedures   Medications Ordered in the ED  sodium chloride  0.9 % bolus 500 mL (has no administration in time range)  iohexol  (OMNIPAQUE ) 350 MG/ML injection 75 mL (has no administration in time range)                                    Medical Decision Making Amount and/or Complexity of Data Reviewed Labs: ordered. Radiology: ordered.  Risk Prescription drug management.     HPI:     Chief complaint says chest pain but patient denies all chest pain.  Patient has more right-sided back pain.  Worse with certain movements.  No exertional chest pain.  No midsternal chest pain.  No pleuritic chest pain no hemoptysis at this time.  No fever no chills.  Family states that they have been having a much harder time getting patient to eat and drink here recently.  Patient states he is not hungry or thirsty.   Patient states that he is got a very dry mouth.  No obvious fever or chills.  No nausea vomit diarrhea that he nor family endorses at this time.  No obvious dysuria.  It was concerned so brought him into the ED for further evaluation. Previous medical history reviewed : Last discharged in November 2025 in the setting of sepsis due to pneumonia.  Stage IV bladder cancer with metastatic  disease to bone and lymph nodes.  MDM:   Upon exam, patient slightly tachycardic but otherwise afebrile.  Maps appropriate.  Patient little tangential in terms of his chief complaint.  No chest pain.  No clear chest pain or hemoptysis.  No concerns for ACS pathology at this point time.  Patient worsening more right-sided back pain.  Unable to reproduce at this time.  Will obtain chest x-ray to rule out infiltrate such as pneumonia.  Obtain UA rule out UTI.  Obtain laboratory workup to screen for any kind infectious or large metabolic derangements.   Reevaluation:   Upon reexamination, patient hemodynamically stable.    Patient signed out pending labs/imaging.        Final diagnoses:  Chronic right-sided low back pain without sciatica    ED Discharge Orders     None          Simon Lavonia SAILOR, MD 05/21/24 2336

## 2024-05-21 NOTE — ED Provider Notes (Signed)
  Provider Note MRN:  [id number]  Arrival date & time: 05/22/24    ED Course and Medical Decision Making  Assumed care of patient at sign-out or upon transfer.  History of metastatic bladder cancer here with abdominal tenderness, tachycardia awaiting labs, CT imaging.  12:30 AM update: Patient resting comfortably on my evaluation with normal vitals, he feels well.  Labs and CT imaging revealed possible UTI, CT imaging showing largely stable metastatic disease, possibly 2 new rib fractures though patient does not really remember a recent fall.  No indication for further testing or admission.  Discharged home, has follow-up with oncology at [facility] soon.  Procedures  Final Clinical Impressions(s) / ED Diagnoses     ICD-10-CM   1. Chronic right-sided low back pain without sciatica  M54.50    G89.29     2. Acute cystitis without hematuria  N30.00       ED Discharge Orders          Ordered    cephALEXin  (KEFLEX ) 500 MG capsule  3 times daily        05/22/24 0040              Discharge Instructions      You were evaluated in the Emergency Department and after careful evaluation, we did not find any emergent condition requiring admission or further testing in the hospital.  Your exam/testing today was overall reassuring.  You might have a urinary tract infection.  You might also have a broken rib.  Take the Keflex  antibiotic as directed and follow-up with your regular doctors.  Use Tylenol  and Motrin for pain.  Please return to the Emergency Department if you experience any worsening of your condition.  Thank you for allowing us  to be a part of your care.       Ozell HERO. Theadore, MD [facility] Emergency Medicine [facility] [email or web address]    Theadore Ozell HERO, MD 05/22/24 [phone]

## 2024-05-21 NOTE — ED Notes (Signed)
 Patient transported to CT

## 2024-05-22 ENCOUNTER — Other Ambulatory Visit: Payer: Self-pay | Admitting: *Deleted

## 2024-05-22 ENCOUNTER — Inpatient Hospital Stay

## 2024-05-22 DIAGNOSIS — C689 Malignant neoplasm of urinary organ, unspecified: Secondary | ICD-10-CM

## 2024-05-22 DIAGNOSIS — R5381 Other malaise: Secondary | ICD-10-CM

## 2024-05-22 DIAGNOSIS — M545 Low back pain, unspecified: Secondary | ICD-10-CM

## 2024-05-22 DIAGNOSIS — E861 Hypovolemia: Secondary | ICD-10-CM

## 2024-05-22 DIAGNOSIS — N2889 Other specified disorders of kidney and ureter: Secondary | ICD-10-CM

## 2024-05-22 MED ORDER — CEPHALEXIN 500 MG PO CAPS: 500.0000 mg | ORAL_CAPSULE | Freq: Three times a day (TID) | ORAL | 0 refills | Status: DC

## 2024-05-22 NOTE — Discharge Instructions (Signed)
 You were evaluated in the Emergency Department and after careful evaluation, we did not find any emergent condition requiring admission or further testing in the hospital.  Your exam/testing today was overall reassuring.  You might have a urinary tract infection.  You might also have a broken rib.  Take the Keflex  antibiotic as directed and follow-up with your regular doctors.  Use Tylenol  and Motrin for pain.  Please return to the Emergency Department if you experience any worsening of your condition.  Thank you for allowing us  to be a part of your care.

## 2024-05-22 NOTE — Progress Notes (Signed)
 Referral made to [facility] for PT,OT,Nursing and Aide per the request of the daughter Channing.  Patient's status has declined secondary to disease process.  He has also sustained rib fractures from a recent fall.  Will cancel appointments tomorrow, per her request, as he is not physically able to come.  Encouraged to make sure he maintains adequate hydration and nutrition as much as possible.  Made her aware that they would not be able to come out until Monday due to the Holiday.  Verbalized understanding.

## 2024-05-23 ENCOUNTER — Inpatient Hospital Stay

## 2024-05-27 ENCOUNTER — Emergency Department (HOSPITAL_COMMUNITY)

## 2024-05-27 ENCOUNTER — Encounter (HOSPITAL_COMMUNITY): Payer: Self-pay | Admitting: Radiology

## 2024-05-27 ENCOUNTER — Inpatient Hospital Stay (HOSPITAL_COMMUNITY)
Admission: EM | Admit: 2024-05-27 | Discharge: 2024-06-01 | DRG: 064 | Disposition: A | Attending: Family Medicine | Admitting: Family Medicine

## 2024-05-27 ENCOUNTER — Other Ambulatory Visit: Payer: Self-pay

## 2024-05-27 DIAGNOSIS — I10 Essential (primary) hypertension: Secondary | ICD-10-CM | POA: Diagnosis present

## 2024-05-27 DIAGNOSIS — G9340 Encephalopathy, unspecified: Secondary | ICD-10-CM | POA: Diagnosis not present

## 2024-05-27 DIAGNOSIS — I639 Cerebral infarction, unspecified: Secondary | ICD-10-CM | POA: Insufficient documentation

## 2024-05-27 DIAGNOSIS — C689 Malignant neoplasm of urinary organ, unspecified: Secondary | ICD-10-CM | POA: Diagnosis present

## 2024-05-27 LAB — CBC WITH DIFFERENTIAL/PLATELET
Abs Immature Granulocytes: 0.03 K/uL (ref 0.00–0.07)
Basophils Absolute: 0.1 K/uL (ref 0.0–0.1)
Basophils Relative: 1 %
Eosinophils Absolute: 0.1 K/uL (ref 0.0–0.5)
Eosinophils Relative: 1 %
HCT: 27.4 % — ABNORMAL LOW (ref 39.0–52.0)
Hemoglobin: 9.4 g/dL — ABNORMAL LOW (ref 13.0–17.0)
Immature Granulocytes: 0 %
Lymphocytes Relative: 9 %
Lymphs Abs: 0.9 K/uL (ref 0.7–4.0)
MCH: 34.4 pg — ABNORMAL HIGH (ref 26.0–34.0)
MCHC: 34.3 g/dL (ref 30.0–36.0)
MCV: 100.4 fL — ABNORMAL HIGH (ref 80.0–100.0)
Monocytes Absolute: 0.8 K/uL (ref 0.1–1.0)
Monocytes Relative: 8 %
Neutro Abs: 7.6 K/uL (ref 1.7–7.7)
Neutrophils Relative %: 81 %
Platelets: 317 K/uL (ref 150–400)
RBC: 2.73 MIL/uL — ABNORMAL LOW (ref 4.22–5.81)
RDW: 20.3 % — ABNORMAL HIGH (ref 11.5–15.5)
WBC: 9.5 K/uL (ref 4.0–10.5)
nRBC: 0 % (ref 0.0–0.2)

## 2024-05-27 LAB — COMPREHENSIVE METABOLIC PANEL WITH GFR
ALT: 23 U/L (ref 0–44)
AST: 35 U/L (ref 15–41)
Albumin: 3.4 g/dL — ABNORMAL LOW (ref 3.5–5.0)
Alkaline Phosphatase: 194 U/L — ABNORMAL HIGH (ref 38–126)
Anion gap: 12 (ref 5–15)
BUN: 23 mg/dL (ref 8–23)
CO2: 25 mmol/L (ref 22–32)
Calcium: 9.6 mg/dL (ref 8.9–10.3)
Chloride: 99 mmol/L (ref 98–111)
Creatinine, Ser: 0.86 mg/dL (ref 0.61–1.24)
GFR, Estimated: >60 mL/min (ref >60)
Glucose, Bld: 102 mg/dL — ABNORMAL HIGH (ref 70–99)
Potassium: 4.3 mmol/L (ref 3.5–5.1)
Sodium: 136 mmol/L (ref 135–145)
Total Bilirubin: 0.4 mg/dL (ref 0.0–1.2)
Total Protein: 7.6 g/dL (ref 6.5–8.1)

## 2024-05-27 LAB — URINALYSIS, W/ REFLEX TO CULTURE (INFECTION SUSPECTED)
Bacteria, UA: NONE SEEN
Bilirubin Urine: NEGATIVE
Glucose, UA: NEGATIVE mg/dL
Ketones, ur: NEGATIVE mg/dL
Nitrite: NEGATIVE
Protein, ur: NEGATIVE mg/dL
Specific Gravity, Urine: 1.018 (ref 1.005–1.030)
pH: 6 (ref 5.0–8.0)

## 2024-05-27 MED ORDER — ACETAMINOPHEN 160 MG/5ML PO SOLN: 650.0000 mg | ORAL | Status: DC | PRN

## 2024-05-27 MED ORDER — SODIUM CHLORIDE 0.9 % IV BOLUS
500.0000 mL | Freq: Once | INTRAVENOUS | Status: AC
  Administered 2024-05-27: 500 mL via INTRAVENOUS

## 2024-05-27 MED ORDER — POLYVINYL ALCOHOL 1.4 % OP SOLN
1.0000 [drp] | Freq: Four times a day (QID) | OPHTHALMIC | Status: DC
  Administered 2024-05-27 – 2024-06-01 (×16): 1 [drp] via OPHTHALMIC
  Filled 2024-05-27 (×3): qty 15

## 2024-05-27 MED ORDER — SODIUM CHLORIDE 0.9 % IV BOLUS
1000.0000 mL | Freq: Once | INTRAVENOUS | Status: AC
  Administered 2024-05-27: 1000 mL via INTRAVENOUS

## 2024-05-27 MED ORDER — ASPIRIN 325 MG PO TABS
325.0000 mg | ORAL_TABLET | Freq: Every day | ORAL | Status: DC
  Administered 2024-05-28: 325 mg via ORAL
  Filled 2024-05-27 (×2): qty 1

## 2024-05-27 MED ORDER — TAMSULOSIN HCL 0.4 MG PO CAPS
0.4000 mg | ORAL_CAPSULE | Freq: Every day | ORAL | Status: DC
  Administered 2024-05-27 – 2024-05-31 (×5): 0.4 mg via ORAL
  Filled 2024-05-27 (×5): qty 1

## 2024-05-27 MED ORDER — ENOXAPARIN SODIUM 40 MG/0.4ML IJ SOSY
40.0000 mg | PREFILLED_SYRINGE | INTRAMUSCULAR | Status: DC
  Administered 2024-05-27 – 2024-05-31 (×5): 40 mg via SUBCUTANEOUS
  Filled 2024-05-27 (×5): qty 0.4

## 2024-05-27 MED ORDER — ASPIRIN 300 MG RE SUPP
300.0000 mg | Freq: Every day | RECTAL | Status: DC
  Administered 2024-05-27: 300 mg via RECTAL
  Filled 2024-05-27: qty 1

## 2024-05-27 MED ORDER — GADOBUTROL 1 MMOL/ML IV SOLN
6.0000 mL | Freq: Once | INTRAVENOUS | Status: AC | PRN
  Administered 2024-05-27: 6 mL via INTRAVENOUS

## 2024-05-27 MED ORDER — CARBOXYMETHYLCELL-GLYCERIN PF 0.5-0.9 % OP SOLN: 2.0000 [drp] | Freq: Four times a day (QID) | OPHTHALMIC | Status: DC

## 2024-05-27 MED ORDER — SENNOSIDES-DOCUSATE SODIUM 8.6-50 MG PO TABS: 1.0000 | ORAL_TABLET | Freq: Every evening | ORAL | Status: DC | PRN

## 2024-05-27 MED ORDER — STROKE: EARLY STAGES OF RECOVERY BOOK
Freq: Once | Status: AC
  Filled 2024-05-27: qty 1

## 2024-05-27 MED ORDER — ACETAMINOPHEN 325 MG PO TABS
650.0000 mg | ORAL_TABLET | ORAL | Status: DC | PRN
  Administered 2024-05-31 (×2): 650 mg via ORAL
  Filled 2024-05-27 (×2): qty 2

## 2024-05-27 MED ORDER — ACETAMINOPHEN 650 MG RE SUPP: 650.0000 mg | RECTAL | Status: DC | PRN

## 2024-05-27 MED ORDER — SODIUM CHLORIDE 0.9 % IV SOLN: 2.0000 g | INTRAVENOUS | Status: DC

## 2024-05-27 MED ORDER — SODIUM CHLORIDE 0.9 % IV SOLN: INTRAVENOUS | Status: DC

## 2024-05-27 MED ORDER — ASPIRIN 81 MG PO CHEW
324.0000 mg | CHEWABLE_TABLET | Freq: Once | ORAL | Status: DC
  Filled 2024-05-27: qty 4

## 2024-05-27 MED ORDER — ASPIRIN 300 MG RE SUPP
300.0000 mg | Freq: Once | RECTAL | Status: AC
  Administered 2024-05-27: 300 mg via RECTAL
  Filled 2024-05-27: qty 1

## 2024-05-27 NOTE — ED Notes (Signed)
 Patient transported to CT

## 2024-05-27 NOTE — ED Provider Notes (Signed)
 Patient has been admitted by the hospitalist for altered mental status in the presence of having new onset acute strokes    Cleotilde Rogue, MD 05/27/24 [phone]

## 2024-05-27 NOTE — ED Triage Notes (Signed)
 Pt comes in for AMS. 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   Pt is currently on abx for a UTI.

## 2024-05-27 NOTE — ED Provider Notes (Signed)
 [place] EMERGENCY DEPARTMENT AT [facility] Provider Note   CSN: [id number] Arrival date & time: 05/27/24  1149     Patient presents with: Altered Mental Status   Dean Randolph is a 83 y.o. male.    Altered Mental Status Patient brought in by daughter with mental status change.  Has been worse over the last few days had been treated with UTI antibiotics around 6 days ago.  Has gotten worse.  Had CT scan at that time.  Does have a history of metastatic cancer.  Has been started on  what sounds like Remeron  to help with diet and has been on oxycodone .  Now more confusion.  Unable to recommend family.  Decreased oral intake.    Past Medical History:  Diagnosis Date   Bladder cancer (HCC)    Hypertension     Prior to Admission medications   Medication Sig Start Date End Date Taking? Authorizing Provider  Carboxymethylcell-Glycerin  PF 0.5-0.9 % SOLN Place 2 drops into both eyes 4 (four) times daily. 04/06/24   Kandala, Hyndavi, MD  cephALEXin  (KEFLEX ) 500 MG capsule Take 1 capsule (500 mg total) by mouth 3 (three) times daily for 7 days. 05/22/24 05/29/24  Theadore Ozell HERO, MD  famotidine  (PEPCID ) 20 MG tablet Take 1 tablet (20 mg total) by mouth at bedtime. 05/04/24   Rashid, Farhan, MD  HYDROcodone -acetaminophen  (NORCO/VICODIN) 5-325 MG tablet Take 2 tablets by mouth every 6 (six) hours as needed for severe pain (pain score 7-10). 04/12/24   Davonna Siad, MD  lidocaine -prilocaine  (EMLA ) cream Apply to affected area once Patient taking differently: Apply 1 Application topically once. Apply to affected area once for port access 04/06/24   Kandala, Hyndavi, MD  mirtazapine  (REMERON ) 15 MG tablet Take 1 tablet (15 mg total) by mouth at bedtime. 05/15/24   Davonna Siad, MD  ondansetron  (ZOFRAN ) 8 MG tablet Take 1 tablet (8 mg total) by mouth every 8 (eight) hours as needed for nausea or vomiting. 04/06/24   Kandala, Hyndavi, MD  prochlorperazine  (COMPAZINE ) 10 MG tablet  Take 1 tablet (10 mg total) by mouth every 6 (six) hours as needed for nausea or vomiting. 04/06/24   Davonna Siad, MD  tamsulosin  (FLOMAX ) 0.4 MG CAPS capsule Take by mouth. 05/11/24   [provider]    Allergies: Patient has no known allergies.    Review of Systems  Updated Vital Signs BP 116/74 (BP Location: Left Arm)   Pulse (!) 118   Temp 98.4 F (36.9 C) (Oral)   Resp 18   Ht 5' 9 (1.753 m)   Wt 65.3 kg   SpO2 98%   BMI 21.26 kg/m   Physical Exam Vitals reviewed.  Cardiovascular:     Rate and Rhythm: Tachycardia present.  Abdominal:     Tenderness: There is no abdominal tenderness.  Skin:    Capillary Refill: Capillary refill takes less than 2 seconds.  Neurological:     Mental Status: He is alert.     Comments: Confused to who the family member is with him.  Somewhat slow to answer which is reportedly unusual for him.  Appears generally weak.     (all labs ordered are listed, but only abnormal results are displayed) Labs Reviewed  COMPREHENSIVE METABOLIC PANEL WITH GFR  URINALYSIS, W/ REFLEX TO CULTURE (INFECTION SUSPECTED)  CBC WITH DIFFERENTIAL/PLATELET    EKG: None  Radiology: CT Head Wo Contrast Result Date: 05/27/2024 EXAM: CT HEAD WITHOUT CONTRAST 05/27/2024 12:58:28 PM TECHNIQUE: CT of  the head was performed without the administration of intravenous contrast. Automated exposure control, iterative reconstruction, and/or weight based adjustment of the mA/kV was utilized to reduce the radiation dose to as low as reasonably achievable. COMPARISON: None available. CLINICAL HISTORY: Mental status change, unknown cause. FINDINGS: BRAIN AND VENTRICLES: No acute hemorrhage. No evidence of acute infarct. No hydrocephalus. No extra-axial collection. No mass effect or midline shift. There is age-related atrophy and mild-to-moderate periventricular white matter disease. There is moderate calcific atheromatous disease within the carotid siphons. ORBITS: No  acute abnormality. Patient is status post bilateral lens replacement. SINUSES: No acute abnormality. SOFT TISSUES AND SKULL: No acute soft tissue abnormality. No skull fracture. IMPRESSION: 1. No acute intracranial abnormality. 2. Age-related atrophy and mild-to-moderate periventricular white matter disease. 3. Moderate calcific atheromatous disease within the carotid siphons. Electronically signed by: Evalene Coho MD 05/27/2024 01:02 PM EST RP Workstation: [id number]     Procedures   Medications Ordered in the ED  sodium chloride  0.9 % bolus 500 mL (500 mLs Intravenous New Bag/Given 05/27/24 1405)  gadobutrol  (GADAVIST ) 1 MMOL/ML injection 6 mL (6 mLs Intravenous Contrast Given 05/27/24 1447)                                    Medical Decision Making Amount and/or Complexity of Data Reviewed Labs: ordered. Radiology: ordered.  Risk Prescription drug management.   Patient with generalized weakness and confusion.  Multiple potential causes.  Differential diagnosis does include cause such as dehydration, UTI, intracranial metastasis, polypharmacy.  Head CT done and reassuring.  Will have her get MRI with widely metastatic cancer.  Also check urinalysis and basic blood work.  Will likely require admission to the hospital.  Care turned over to Dr. Cleotilde.      Final diagnoses:  None    ED Discharge Orders     None          Patsey Lot, MD 05/27/24 1459

## 2024-05-27 NOTE — H&P (Signed)
 History and Physical    Patient: Dean Randolph DOB: 04-20-1941 DOA: 05/27/2024 DOS: the patient was seen and examined on 05/27/2024 PCP: Davonna Siad, MD  Patient coming from: Home  Chief Complaint:  Chief Complaint  Patient presents with   Altered Mental Status   HPI: Dean Randolph is a 83 y.o. 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  Patient recently treated for UTI 6 days ago and presents with worsening confusion.  His confusion was worse over the last 24 hours.  He has also had decreased appetite and oral intake.  No fevers, chills, nausea, vomiting.  MRI shows new strokes.  History obtained by daughter  Review of Systems: As mentioned in the history of present illness. All other systems reviewed and are negative. Past Medical History:  Diagnosis Date   Bladder cancer (HCC)    Hypertension    Past Surgical History:  Procedure Laterality Date   BILIARY DILATION  03/12/2022   Procedure: BILIARY DILATION;  Surgeon: Wilhelmenia Aloha Raddle., MD;  Location: [facility];  Service: Gastroenterology;;   BILIARY STENT PLACEMENT N/A 02/06/2021   Procedure: BILIARY STENT PLACEMENT;  Surgeon: Golda Claudis PENNER, MD;  Location: [facility];  Service: Gastroenterology;  Laterality: N/A;   BILIARY STENT PLACEMENT N/A 03/26/2021   Procedure: BILIARY STENT PLACEMENT 10 FRENCH BY 9cm;  Surgeon: Golda Claudis PENNER, MD;  Location: [facility];  Service: Endoscopy;  Laterality: N/A;   BILIARY STENT PLACEMENT N/A 01/19/2022   Procedure: BILIARY STENT PLACEMENT;  Surgeon: Wilhelmenia Aloha Raddle., MD;  Location: [facility];  Service: Gastroenterology;  Laterality: N/A;   BIOPSY  01/19/2022   Procedure: BIOPSY;  Surgeon: Wilhelmenia Aloha Raddle., MD;  Location: [facility];  Service: Gastroenterology;;   CATARACT EXTRACTION Bilateral    CHOLECYSTECTOMY N/A 04/25/2021   Procedure:  LAPAROSCOPIC CHOLECYSTECTOMY;  Surgeon: Mavis Anes, MD;  Location: [facility];  Service: General;  Laterality: N/A;   ENDOSCOPIC RETROGRADE CHOLANGIOPANCREATOGRAPHY (ERCP) WITH PROPOFOL  N/A 01/19/2022   Procedure: ENDOSCOPIC RETROGRADE CHOLANGIOPANCREATOGRAPHY (ERCP) WITH PROPOFOL ;  Surgeon: Wilhelmenia Aloha Raddle., MD;  Location: [facility];  Service: Gastroenterology;  Laterality: N/A;   ENDOSCOPIC RETROGRADE CHOLANGIOPANCREATOGRAPHY (ERCP) WITH PROPOFOL  N/A 03/12/2022   Procedure: ENDOSCOPIC RETROGRADE CHOLANGIOPANCREATOGRAPHY (ERCP) WITH PROPOFOL ;  Surgeon: Wilhelmenia Aloha Raddle., MD;  Location: [facility];  Service: Gastroenterology;  Laterality: N/A;   ERCP N/A 02/06/2021   Procedure: ENDOSCOPIC RETROGRADE CHOLANGIOPANCREATOGRAPHY (ERCP);  Surgeon: Golda Claudis PENNER, MD;  Location: [facility];  Service: Gastroenterology;  Laterality: N/A;   ERCP N/A 03/26/2021   Procedure: ENDOSCOPIC RETROGRADE CHOLANGIOPANCREATOGRAPHY (ERCP) with EXTENDED SPHINCTEROTOMY;  Surgeon: Golda Claudis PENNER, MD;  Location: [facility];  Service: Endoscopy;  Laterality: N/A;   ERCP N/A 10/27/2021   Procedure: ENDOSCOPIC RETROGRADE CHOLANGIOPANCREATOGRAPHY (ERCP);  Surgeon: Golda Claudis PENNER, MD;  Location: [facility];  Service: Endoscopy;  Laterality: N/A;  no site    GASTROINTESTINAL STENT REMOVAL N/A 03/26/2021   Procedure: GASTROINTESTINAL STENT REMOVAL;  Surgeon: Golda Claudis PENNER, MD;  Location: [facility];  Service: Endoscopy;  Laterality: N/A;   GASTROINTESTINAL STENT REMOVAL N/A 10/27/2021   Procedure: GASTROINTESTINAL STENT EXCHANGE;  Surgeon: Golda Claudis PENNER, MD;  Location: [facility];  Service: Endoscopy;  Laterality: N/A;  no site    REMOVAL OF STONES N/A 03/26/2021   Procedure: REMOVAL OF STONES WITH EXTRACTION BASKET;  Surgeon: Golda Claudis PENNER, MD;  Location: [facility];  Service: Endoscopy;  Laterality: N/A;   REMOVAL  OF STONES N/A 10/27/2021   Procedure: REMOVAL OF STONES;  Surgeon: Golda Claudis PENNER, MD;  Location: [facility];   Service: Endoscopy;  Laterality: N/A;   REMOVAL OF STONES  01/19/2022   Procedure: REMOVAL OF STONES;  Surgeon: Wilhelmenia Aloha Raddle., MD;  Location: [facility];  Service: Gastroenterology;;   REMOVAL OF STONES  03/12/2022   Procedure: REMOVAL OF STONES;  Surgeon: Wilhelmenia Aloha Raddle., MD;  Location: [facility];  Service: Gastroenterology;;   ANNETT N/A 02/06/2021   Procedure: ANNETT;  Surgeon: Golda Claudis PENNER, MD;  Location: [facility];  Service: Gastroenterology;  Laterality: N/A;   SPHINCTEROTOMY N/A 10/27/2021   Procedure: SPHINCTEROTOMY;  Surgeon: Golda Claudis PENNER, MD;  Location: [facility];  Service: Endoscopy;  Laterality: N/A;   SPYGLASS CHOLANGIOSCOPY N/A 01/19/2022   Procedure: SPYGLASS CHOLANGIOSCOPY;  Surgeon: Wilhelmenia Aloha Raddle., MD;  Location: [facility];  Service: Gastroenterology;  Laterality: N/A;   SPYGLASS CHOLANGIOSCOPY N/A 03/12/2022   Procedure: DEBHOJDD CHOLANGIOSCOPY;  Surgeon: Wilhelmenia Aloha Raddle., MD;  Location: [facility];  Service: Gastroenterology;  Laterality: N/A;   SPYGLASS LITHOTRIPSY N/A 03/12/2022   Procedure: DEBHOJDD LITHOTRIPSY;  Surgeon: Wilhelmenia Aloha Raddle., MD;  Location: [facility];  Service: Gastroenterology;  Laterality: N/A;   STENT REMOVAL  01/19/2022   Procedure: STENT REMOVAL;  Surgeon: Wilhelmenia Aloha Raddle., MD;  Location: [facility];  Service: Gastroenterology;;   CLEDA REMOVAL  03/12/2022   Procedure: STENT REMOVAL;  Surgeon: Wilhelmenia Aloha Raddle., MD;  Location: [facility];  Service: Gastroenterology;;   STONE EXTRACTION WITH BASKET  01/19/2022   Procedure: STONE EXTRACTION WITH BASKET;  Surgeon: Wilhelmenia Aloha Raddle., MD;  Location: [facility];  Service: Gastroenterology;;   Social History:  reports that he has never smoked. He has been exposed to tobacco smoke. He has never used smokeless tobacco. He reports that he does not drink alcohol and does not use drugs.  No Known Allergies  Family  History  Problem Relation Age of Onset   Stroke Mother    Colon cancer Neg Hx    Esophageal cancer Neg Hx    Inflammatory bowel disease Neg Hx    Liver disease Neg Hx    Pancreatic cancer Neg Hx    Rectal cancer Neg Hx    Stomach cancer Neg Hx     Prior to Admission medications   Medication Sig Start Date End Date Taking? Authorizing Provider  Carboxymethylcell-Glycerin  PF 0.5-0.9 % SOLN Place 2 drops into both eyes 4 (four) times daily. 04/06/24   Kandala, Hyndavi, MD  cephALEXin  (KEFLEX ) 500 MG capsule Take 1 capsule (500 mg total) by mouth 3 (three) times daily for 7 days. 05/22/24 05/29/24  Theadore Ozell HERO, MD  famotidine  (PEPCID ) 20 MG tablet Take 1 tablet (20 mg total) by mouth at bedtime. 05/04/24   Rashid, Farhan, MD  HYDROcodone -acetaminophen  (NORCO/VICODIN) 5-325 MG tablet Take 2 tablets by mouth every 6 (six) hours as needed for severe pain (pain score 7-10). 04/12/24   Davonna Siad, MD  lidocaine -prilocaine  (EMLA ) cream Apply to affected area once Patient taking differently: Apply 1 Application topically once. Apply to affected area once for port access 04/06/24   Davonna Siad, MD  mirtazapine  (REMERON ) 15 MG tablet Take 1 tablet (15 mg total) by mouth at bedtime. 05/15/24   Kandala, Hyndavi, MD  ondansetron  (ZOFRAN ) 8 MG tablet Take 1 tablet (8 mg total) by mouth every 8 (eight) hours as needed for nausea or vomiting. 04/06/24   Davonna Siad, MD  prochlorperazine  (COMPAZINE ) 10 MG tablet  Take 1 tablet (10 mg total) by mouth every 6 (six) hours as needed for nausea or vomiting. 04/06/24   Kandala, Hyndavi, MD  tamsulosin (FLOMAX) 0.4 MG CAPS capsule Take by mouth. 05/11/24   [provider]    Physical Exam: Vitals:   05/27/24 1645 05/27/24 1650 05/27/24 1651 05/27/24 1700  BP: (!) 75/51 (!) 146/88  135/85  Pulse: 93 90  86  Resp: (!) 26 20  19   Temp:   97.6 F (36.4 C)   TempSrc:   Oral   SpO2: 98% 97%  98%  Weight:      Height:       General:  Elderly male. Awake and alert and oriented x0. No acute cardiopulmonary distress.  HEENT: Normocephalic atraumatic.  Right and left ears normal in appearance.  Pupils equal, round, reactive to light. Extraocular muscles are intact. Sclerae anicteric and noninjected.  Moist mucosal membranes. No mucosal lesions.  Neck: Neck supple without lymphadenopathy. No carotid bruits. No masses palpated.  Cardiovascular: Regular rate with normal S1-S2 sounds. No murmurs, rubs, gallops auscultated. No JVD.  Respiratory: Good respiratory effort with no wheezes, rales, rhonchi. Lungs clear to auscultation bilaterally.  No accessory muscle use. Abdomen: Soft, nontender, nondistended. Active bowel sounds. No masses or hepatosplenomegaly  Skin: No rashes, lesions, or ulcerations.  Dry, warm to touch. 2+ dorsalis pedis and radial pulses. Musculoskeletal: No calf or leg pain. All major joints not erythematous nontender.  No upper or lower joint deformation.  Good ROM.  No contractures  Psychiatric: Lacks insight and judgment.. Neurologic: Unable to fully assess due to not being able to follow commands  Data Reviewed: Labs and imaging reviewed by me  Assessment and Plan: No notes have been filed under this hospital service. Service: Hospitalist  Principal Problem:   Encephalopathy acute Active Problems:   Essential hypertension   Urothelial carcinoma (HCC)   Stroke (HCC)  Acute encephalopathy secondary to stroke With to stepdown due to encephalopathy Ischemic stroke Observation on telemetry MRI/MRA head done CTA head/neck Echocardiogram tomorrow Hemoglobin A1c, lipid panel in the morning PT/OT/speech therapy consult Full aspirin Permissive htn. Will consult neuro after workup complete Hypertension Permissive hypertension Bladder cancer   Advance Care Planning:   Code Status: Limited: Do not attempt resuscitation (DNR) -DNR-LIMITED -Do Not Intubate/DNI confirmed with patient's wife -has documents  scanned in  Consults: Neuro after workup complete  Family Communication: Patient's wife, patient's daughter and granddaughter  Severity of Illness: The appropriate patient status for this patient is INPATIENT. Inpatient status is judged to be reasonable and necessary in order to provide the required intensity of service to ensure the patient's safety. The patient's presenting symptoms, physical exam findings, and initial radiographic and laboratory data in the context of their chronic comorbidities is felt to place them at high risk for further clinical deterioration. Furthermore, it is not anticipated that the patient will be medically stable for discharge from the hospital within 2 midnights of admission.   * I certify that at the point of admission it is my clinical judgment that the patient will require inpatient hospital care spanning beyond 2 midnights from the point of admission due to high intensity of service, high risk for further deterioration and high frequency of surveillance required.*  Author: Kaden Dunkel J Ohanna Gassert, DO 05/27/2024 6:12 PM  For on call review [email or web address].

## 2024-05-28 ENCOUNTER — Inpatient Hospital Stay (HOSPITAL_COMMUNITY)

## 2024-05-28 DIAGNOSIS — G9340 Encephalopathy, unspecified: Secondary | ICD-10-CM

## 2024-05-28 DIAGNOSIS — I6389 Other cerebral infarction: Secondary | ICD-10-CM | POA: Diagnosis not present

## 2024-05-28 DIAGNOSIS — I639 Cerebral infarction, unspecified: Secondary | ICD-10-CM | POA: Diagnosis not present

## 2024-05-28 DIAGNOSIS — C689 Malignant neoplasm of urinary organ, unspecified: Secondary | ICD-10-CM

## 2024-05-28 DIAGNOSIS — I10 Essential (primary) hypertension: Secondary | ICD-10-CM

## 2024-05-28 LAB — LIPID PANEL
Cholesterol: 125 mg/dL (ref 0–200)
HDL: 27 mg/dL — ABNORMAL LOW (ref >40)
LDL Cholesterol: 74 mg/dL (ref 0–99)
Total CHOL/HDL Ratio: 4.7 ratio
Triglycerides: 121 mg/dL (ref <150)
VLDL: 24 mg/dL (ref 0–40)

## 2024-05-28 LAB — MRSA NEXT GEN BY PCR, NASAL: MRSA by PCR Next Gen: NOT DETECTED

## 2024-05-28 LAB — ECHOCARDIOGRAM COMPLETE
Area-P 1/2: 4.49 cm2
Height: 69 in
S' Lateral: 2.7 cm
Weight: 2303.37 [oz_av]

## 2024-05-28 MED ORDER — ASPIRIN 81 MG PO TBEC
81.0000 mg | DELAYED_RELEASE_TABLET | Freq: Every day | ORAL | Status: DC
  Administered 2024-05-29 – 2024-06-01 (×4): 81 mg via ORAL
  Filled 2024-05-28 (×4): qty 1

## 2024-05-28 MED ORDER — ATORVASTATIN CALCIUM 10 MG PO TABS
10.0000 mg | ORAL_TABLET | Freq: Every evening | ORAL | Status: DC
  Administered 2024-05-28: 10 mg via ORAL
  Filled 2024-05-28: qty 1

## 2024-05-28 MED ORDER — SODIUM CHLORIDE 0.9 % IV SOLN: INTRAVENOUS | Status: DC

## 2024-05-28 MED ORDER — HYDROXYZINE HCL 25 MG PO TABS
25.0000 mg | ORAL_TABLET | Freq: Once | ORAL | Status: AC | PRN
  Administered 2024-05-28: 25 mg via ORAL
  Filled 2024-05-28: qty 1

## 2024-05-28 MED ORDER — CLOPIDOGREL BISULFATE 75 MG PO TABS
75.0000 mg | ORAL_TABLET | Freq: Every day | ORAL | Status: DC
  Administered 2024-05-29 – 2024-06-01 (×4): 75 mg via ORAL
  Filled 2024-05-28 (×4): qty 1

## 2024-05-28 MED ORDER — CLOPIDOGREL BISULFATE 75 MG PO TABS
300.0000 mg | ORAL_TABLET | Freq: Once | ORAL | Status: AC
  Administered 2024-05-28: 300 mg via ORAL
  Filled 2024-05-28: qty 4

## 2024-05-28 MED ORDER — SODIUM CHLORIDE 0.9 % IV SOLN: INTRAVENOUS | Status: AC

## 2024-05-28 MED ORDER — IOHEXOL 350 MG/ML SOLN
75.0000 mL | Freq: Once | INTRAVENOUS | Status: AC | PRN
  Administered 2024-05-28: 75 mL via INTRAVENOUS

## 2024-05-28 NOTE — Plan of Care (Signed)

## 2024-05-28 NOTE — TOC Initial Note (Signed)
 Transition of Care [facility]) - Initial/Assessment Note    Patient Details  Name: Dean Randolph MRN: [id number] Date of Birth: Mar 11, 1941  Transition of Care [facility]) CM/SW Contact:    [name], RN Phone Number: 05/28/2024, 12:11 PM  Clinical Narrative:       Patient admitted with acute encephalopathy, considered to be high risk for readmission. Patient discharged on 11/6 with home health PT, still active with [facility] Patient is followed by oncologist Dr Davonna. Needs Tele Neuro consult. IPCM following.       Expected Discharge Plan: Home w Home Health Services Barriers to Discharge: Continued Medical Work up   Patient Goals and CMS Choice Patient states their goals for this hospitalization and ongoing recovery are:: return home CMS Medicare.gov Compare Post Acute Care list provided to:: Patient Choice offered to / list presented to : Patient Alpha ownership interest in [facility].provided to:: Patient    Expected Discharge Plan and Services       Living arrangements for the past 2 months: Single Family Home                      HH Agency: [facility]        Prior Living Arrangements/Services Living arrangements for the past 2 months: Single Family Home Lives with:: Spouse Patient language and need for interpreter reviewed:: Yes Do you feel safe going back to the place where you live?: Yes      Need for Family Participation in Patient Care: Yes (Comment) Care giver support system in place?: Yes (comment) Current home services: DME Criminal Activity/Legal Involvement Pertinent to Current Situation/Hospitalization: No - Comment as needed  Activities of Daily Living   ADL Screening (condition at time of admission) Independently performs ADLs?: No Does the patient have a NEW difficulty with bathing/dressing/toileting/self-feeding that is expected to last >3 days?: No Does the patient have a NEW difficulty with getting in/out of bed,  walking, or climbing stairs that is expected to last >3 days?: No Does the patient have a NEW difficulty with communication that is expected to last >3 days?: Yes (Initiates electronic notice to provider for possible SLP consult) Is the patient deaf or have difficulty hearing?: No Does the patient have difficulty seeing, even when wearing glasses/contacts?: No Does the patient have difficulty concentrating, remembering, or making decisions?: Yes  Permission Sought/Granted                  Emotional Assessment     Affect (typically observed): Unable to Assess Orientation: : Oriented to Self Alcohol / Substance Use: Not Applicable Psych Involvement: No (comment)  Admission diagnosis:  Encephalopathy acute [G93.40] Acute ischemic stroke [facility]) [I63.9] Acute encephalopathy [G93.40] Patient Active Problem List   Diagnosis Date Noted   Encephalopathy acute 05/27/2024   Stroke (HCC) 05/27/2024   Protein-calorie malnutrition, severe 05/03/2024   Sepsis due to pneumonia (HCC) 05/01/2024   Urothelial carcinoma (HCC) 04/04/2024   Renal mass, left 03/23/2024   Back pain 03/23/2024   Elevated alkaline phosphatase level 04/23/2022   History of ERCP 04/23/2022   Colon cancer screening 04/23/2022   Calculus of gallbladder without cholecystitis without obstruction    Acute cholangitis (HCC) 02/09/2021   Pancreatitis due to biliary obstruction/S/p ERCP with stent Placement on 02/06/21 02/09/2021   Essential hypertension 02/07/2021   Cholangitis (HCC)    Choledocholithiasis/Cholelithiasis--S/p ERCP with Plastic Stent Placement on 02/06/21 02/04/2021   Hyperglycemia 02/04/2021   Intermittent abdominal pain 02/04/2021  Early satiety 02/04/2021   Abnormal weight loss 02/04/2021   Hyperbilirubinemia 02/04/2021   Elevated liver enzymes 02/04/2021   Hematemesis 02/04/2021   Iron deficiency anemia    Transaminitis    PCP:  Davonna Siad, MD Pharmacy:   [facility] [phone] - [place], [place] -  [address] [place] [address] Phone: [phone] Fax: [phone]     Social Drivers of Health (SDOH) Social History: SDOH Screenings   Food Insecurity: No Food Insecurity (05/27/2024)  Housing: Low Risk  (05/27/2024)  Transportation Needs: No Transportation Needs (05/27/2024)  Utilities: Not At Risk (05/27/2024)  Depression (PHQ2-9): Low Risk  (05/15/2024)  Social Connections: Moderately Integrated (05/27/2024)  Tobacco Use: Low Risk  (05/21/2024)   SDOH Interventions:     Readmission Risk Interventions    05/28/2024   12:10 PM 05/02/2024    9:55 AM  Readmission Risk Prevention Plan  Transportation Screening Complete Complete  PCP or Specialist Appt within 5-7 Days  Not Complete  Not Complete comments  PCP list added to AVS  PCP or Specialist Appt within 3-5 Days Not Complete   Home Care Screening  Complete  Medication Review (RN CM)  Complete  HRI or Home Care Consult Complete   Social Work Consult for Recovery Care Planning/Counseling Complete   Palliative Care Screening Not Complete   Medication Review Oceanographer) Complete

## 2024-05-28 NOTE — Progress Notes (Signed)
 I was called about this patient to review of subacute stroke.  On imaging, it has the appearance of an embolic stroke and will need admission for therapy evaluations as well as secondary risk factor modification.  I recommend vascular imaging of the head and neck, echo, lipid panel, A1c, telemetry.  I would start dual antiplatelet therapy with aspirin 81 mg as well as Plavix 75 mg daily after 300 mg load. Following completion of the studies, neurology can provide formal consultation for secondary prevention recommendations.  Aisha Seals, MD [facility]   If 7pm- 7am, please page neurology on call as listed in AMION.

## 2024-05-28 NOTE — Hospital Course (Signed)
 83 y.o. 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  Patient recently treated for UTI 6 days ago and presents with worsening confusion.  His confusion was worse over the last 24 hours.  He has also had decreased appetite and oral intake.  No fevers, chills, nausea, vomiting.   MRI shows new strokes.  History obtained by daughter

## 2024-05-28 NOTE — Plan of Care (Signed)
   Problem: Education: Goal: Knowledge of General Education information will improve Description Including pain rating scale, medication(s)/side effects and non-pharmacologic comfort measures Outcome: Progressing   Problem: Health Behavior/Discharge Planning: Goal: Ability to manage health-related needs will improve Outcome: Progressing

## 2024-05-28 NOTE — Progress Notes (Signed)
 PROGRESS NOTE   Dean Randolph  FMW:[id number] DOB: Apr 19, 1941 DOA: 05/27/2024 PCP: Davonna Siad, MD   Chief Complaint  Patient presents with   Altered Mental Status   Level of care: Stepdown  Brief Admission History:  83 y.o. 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  Patient recently treated for UTI 6 days ago and presents with worsening confusion.  His confusion was worse over the last 24 hours.  He has also had decreased appetite and oral intake.  No fevers, chills, nausea, vomiting.   MRI shows new strokes.  History obtained by daughter   Assessment and Plan:  Acute CVA Acute metabolic encephalopathy -- MRI positive for small acute to early subacute bilateral occipital infarcts -- discussed with Dr. Michaela who recommends CTA  h/n, DAPT, plavix load, TTE, A1c, lipid panel, PT/OT, telemetry -- plan for teleneurology consult on 12/1 for final recommendations -- permissive HTN for now in setting of acute CVA  Essential hypertension  -- do not plan to treat BP<220/120 for permissive HTN  urothelial carcinoma  -- metastasis to bone and lymph nodes -- he has been receiving enfortumab vedotin  and pembrolizumab  -- he is started on palliative radiation for pain control   DVT prophylaxis: enoxaparin   Code Status: DNR  DNI  Family Communication: daughter at bedside updated  Disposition: TBD  Consultants:  Neurology  Procedures:   Antimicrobials:    Subjective: Pt severely confused with delirium  Objective: Vitals:   05/28/24 1000 05/28/24 1100 05/28/24 1117 05/28/24 1200  BP:  (!) 142/75  (!) 145/66  Pulse: 98 97  93  Resp: 19 (!) 25  (!) 23  Temp:   97.8 F (36.6 C)   TempSrc:   Oral   SpO2: 97% 97%  100%  Weight:      Height:        Intake/Output Summary (Last 24 hours) at 05/28/2024 1424 Last data filed at 05/28/2024 0655 Gross per 24 hour   Intake 1976.89 ml  Output 475 ml  Net 1501.89 ml   Filed Weights   05/27/24 1222  Weight: 65.3 kg   Examination:  General exam: Appears calm and comfortable  Respiratory system: Clear to auscultation. Respiratory effort normal. Cardiovascular system: normal S1 & S2 heard. No JVD, murmurs, rubs, gallops or clicks. No pedal edema. Gastrointestinal system: Abdomen is nondistended, soft and nontender. No organomegaly or masses felt. Normal bowel sounds heard. Central nervous system: Alert and oriented. No focal neurological deficits. Extremities: Symmetric 5 x 5 power. Skin: No rashes, lesions or ulcers. Psychiatry: Judgement and insight appear normal. Mood & affect appropriate.   Data Reviewed: I have personally reviewed following labs and imaging studies  CBC: Recent Labs  Lab 05/21/24 2140 05/27/24 1326  WBC 11.9* 9.5  NEUTROABS 10.1* 7.6  HGB 9.4* 9.4*  HCT 28.8* 27.4*  MCV 97.0 100.4*  PLT 280 317    Basic Metabolic Panel: Recent Labs  Lab 05/21/24 2140 05/27/24 1326  NA 132* 136  K 3.8 4.3  CL 96* 99  CO2 24 25  GLUCOSE 140* 102*  BUN 35* 23  CREATININE 0.86 0.86  CALCIUM 9.2 9.6    CBG: No results for input(s): GLUCAP in the last 168 hours.  Recent Results (from the past 240 hours)  Urine Culture     Status: None (Preliminary result)   Collection Time: 05/27/24 12:36 PM   Specimen: Urine, Random  Result Value  Ref Range Status   Specimen Description   Final    URINE, RANDOM Performed at [facility], [address], [place], [place] [address]    Special Requests   Final    NONE Reflexed from [id number] Performed at [facility], [address], [place], [place] [address]    Culture   Final    CULTURE REINCUBATED FOR BETTER GROWTH Performed at [facility], [address] [place], [place] [address]    Report Status PENDING  Incomplete  MRSA Next Gen by PCR, Nasal     Status: None   Collection Time: 05/27/24  6:15 PM   Specimen: Nasal  Mucosa; Nasal Swab  Result Value Ref Range Status   MRSA by PCR Next Gen NOT DETECTED NOT DETECTED Final    Comment: (NOTE) The GeneXpert MRSA Assay (FDA approved for NASAL specimens only), is one component of a comprehensive MRSA colonization surveillance program. It is not intended to diagnose MRSA infection nor to guide or monitor treatment for MRSA infections. Test performance is not FDA approved in patients less than 19 years old. Performed at [facility], [address] [place], [place] [address]      Radiology Studies: ECHOCARDIOGRAM COMPLETE Result Date: 05/28/2024    ECHOCARDIOGRAM REPORT   Patient Name:   Dean Randolph Date of Exam: 05/28/2024 Medical Rec #:  [id number]       Height:       69.0 in Accession #:    [id number]      Weight:       144.0 lb Date of Birth:  [birth year]      BSA:          1.797 m Patient Age:    83 years        BP:           142/75 mmHg Patient Gender: M               HR:           100 bpm. Exam Location:  [name] Procedure: 2D Echo, Cardiac Doppler and Color Doppler (Both Spectral and Color            Flow Doppler were utilized during procedure). Indications:    Stroke I63.9  History:        Patient has no prior history of Echocardiogram examinations.                 Stroke and cancer, Signs/Symptoms:Altered Mental Status; Risk                 Factors:Hypertension and Dyslipidemia.  Sonographer:    Koleen Popper RDCS Referring Phys: [phone] JACOB J STINSON IMPRESSIONS  1. Left ventricular ejection fraction, by estimation, is 55 to 60%. The left ventricle has normal function. The left ventricle has no regional wall motion abnormalities. Left ventricular diastolic parameters were normal.  2. Right ventricular systolic function is normal. The right ventricular size is normal.  3. The mitral valve is normal in structure. No evidence of mitral valve regurgitation. No evidence of mitral stenosis.  4. The aortic valve is tricuspid. There is mild calcification of the  aortic valve. There is mild thickening of the aortic valve. Aortic valve regurgitation is not visualized. Aortic valve sclerosis/calcification is present, without any evidence of aortic stenosis. FINDINGS  Left Ventricle: Left ventricular ejection fraction, by estimation, is 55 to 60%. The left ventricle has normal function. The left ventricle has no regional wall motion  abnormalities. The left ventricular internal cavity size was normal in size. There is  no left ventricular hypertrophy. Left ventricular diastolic parameters were normal. Right Ventricle: The right ventricular size is normal. No increase in right ventricular wall thickness. Right ventricular systolic function is normal. Left Atrium: Left atrial size was normal in size. Right Atrium: Right atrial size was normal in size. Pericardium: There is no evidence of pericardial effusion. Mitral Valve: The mitral valve is normal in structure. Mild mitral annular calcification. No evidence of mitral valve regurgitation. No evidence of mitral valve stenosis. Tricuspid Valve: The tricuspid valve is normal in structure. Tricuspid valve regurgitation is not demonstrated. Aortic Valve: The aortic valve is tricuspid. There is mild calcification of the aortic valve. There is mild thickening of the aortic valve. Aortic valve regurgitation is not visualized. Aortic valve sclerosis/calcification is present, without any evidence of aortic stenosis. Pulmonic Valve: The pulmonic valve was grossly normal. Pulmonic valve regurgitation is not visualized. No evidence of pulmonic stenosis. Aorta: The aortic root and ascending aorta are structurally normal, with no evidence of dilitation. IAS/Shunts: No atrial level shunt detected by color flow Doppler.  LEFT VENTRICLE PLAX 2D LVIDd:         3.80 cm   Diastology LVIDs:         2.70 cm   LV e' medial:    5.22 cm/s LV PW:         1.00 cm   LV E/e' medial:  11.3 LV IVS:        1.10 cm   LV e' lateral:   8.49 cm/s LVOT diam:     1.80  cm   LV E/e' lateral: 7.0 LV SV:         43 LV SV Index:   24 LVOT Area:     2.54 cm  RIGHT VENTRICLE RV S prime:     20.10 cm/s TAPSE (M-mode): 2.0 cm LEFT ATRIUM           Index LA diam:      3.20 cm 1.78 cm/m LA Vol (A2C): 27.4 ml 15.25 ml/m  AORTIC VALVE LVOT Vmax:   106.00 cm/s LVOT Vmean:  65.200 cm/s LVOT VTI:    0.169 m  AORTA Ao Root diam: 3.10 cm Ao Asc diam:  3.20 cm MITRAL VALVE MV Area (PHT): 4.49 cm     SHUNTS MV Decel Time: 169 msec     Systemic VTI:  0.17 m MV E velocity: 59.10 cm/s   Systemic Diam: 1.80 cm MV A velocity: 109.00 cm/s MV E/A ratio:  0.54 Mihai Croitoru MD Electronically signed by Jerel Balding MD Signature Date/Time: 05/28/2024/2:10:34 PM    Final    CT ANGIO HEAD NECK W WO CM Result Date: 05/28/2024 EXAM: CTA HEAD AND NECK WITHOUT AND WITH 05/28/2024 09:09:58 AM TECHNIQUE: CTA of the head and neck was performed without and with the administration of 75 mL of intravenous iohexol  (OMNIPAQUE ) 350 MG/ML injection. Multiplanar 2D and/or 3D reformatted images are provided for review. Automated exposure control, iterative reconstruction, and/or weight based adjustment of the mA/kV was utilized to reduce the radiation dose to as low as reasonably achievable. Stenosis of the internal carotid arteries measured using NASCET criteria. COMPARISON: CT and MRI of the head dated 05/27/2024. Recent CT angiogram of the chest performed 05/21/2024. CLINICAL HISTORY: Neuro deficit, acute, stroke suspected. FINDINGS: CTA NECK: AORTIC ARCH AND ARCH VESSELS: There is mild calcific plaque within the aortic arch. No dissection or arterial injury. No significant  stenosis of the brachiocephalic or subclavian arteries. CERVICAL CAROTID ARTERIES: There is calcific and noncalcific plaque present within the origin of the right internal carotid artery, particularly along the posterior wall, with approximately 40% luminal stenosis. The cervical segment of the right internal carotid artery is otherwise normal  in caliber. There is calcific and noncalcific plaque within the posterolateral origin of the left internal carotid artery, with less than 10% luminal stenosis. The remainder of the cervical segment is normal in caliber. No dissection or arterial injury. CERVICAL VERTEBRAL ARTERIES: The left vertebral artery is dominant. Both vertebral arteries are normal in caliber throughout their respective courses. No dissection, arterial injury, or significant stenosis. LUNGS AND MEDIASTINUM: Mediastinal lymphadenopathy, better demonstrated on recent CT angiogram of the chest performed 05/21/2024. SOFT TISSUES: No cervical lymphadenopathy. The patient is status post bilateral lens replacement. No acute abnormality. BONES: No acute abnormality. CTA HEAD: ANTERIOR CIRCULATION: There is mild calcific plaque within the carotid siphons and communicating segments of the internal carotid arteries, with mild stenosis less than 30%. No significant stenosis of the anterior cerebral arteries. No significant stenosis of the middle cerebral arteries. No aneurysm. POSTERIOR CIRCULATION: There is moderate luminal irregularity of the P1 segment of the left posterior cerebral artery with moderate stenosis. There is mild-to-moderate stenosis of the P2 segment of the right posterior cerebral artery. No significant stenosis of the basilar artery. No significant stenosis of the vertebral arteries. No aneurysm. OTHER: Age-related atrophy and mild-to-moderate periventricular white matter disease. No dural venous sinus thrombosis on this non-dedicated study. IMPRESSION: 1. Moderate luminal irregularity of the P1 segment of the left posterior cerebral artery with moderate stenosis. 2. Mild-to-moderate stenosis of the P2 segment of the right posterior cerebral artery. 3. Calcific and noncalcific plaque within the origin of the right internal carotid artery, particularly along the posterior wall, with approximately 40% luminal stenosis. The cervical  segment is otherwise normal in caliber. 4. Calcific and noncalcific plaque within the posterolateral origin of the left internal carotid artery, with less than 10% luminal stenosis. The remainder of the cervical segment is normal in caliber. 5. Mild calcific plaque within the carotid siphons and communicating segments of the internal carotid arteries, with mild stenosis less than 30%. Electronically signed by: Evalene Coho MD 05/28/2024 09:58 AM EST RP Workstation: [id number]   MR Brain W and Wo Contrast Result Date: 05/27/2024 EXAM: MRI BRAIN WITH AND WITHOUT CONTRAST 05/27/2024 03:01:24 PM TECHNIQUE: Multiplanar multisequence MRI of the head/brain was performed with and without the administration of intravenous contrast. 6 mL (gadobutrol  (GADAVIST ) 1 MMOL/ML injection 6 mL GADOBUTROL  1 MMOL/ML IV SOLN). COMPARISON: Head CT 05/27/2024. CLINICAL HISTORY: Headache, history of cancer. FINDINGS: The examination is intermittently mildly to moderately motion degraded. BRAIN AND VENTRICLES: A 7 mm linear focus of mildly restricted diffusion involving posterior left occipital cortex and a 3 mm focus of mildly restricted diffusion involving right occipital subcortical white matter are consistent with acute to early subacute infarcts. Chronic microhemorrhages are noted in the left thalamus. T2 hyperintensities in the cerebral white matter bilaterally are nonspecific but compatible with mild chronic small vessel ischemic disease. There are chronic lacunar infarcts in the basal ganglia bilaterally. No mass, midline shift, hydrocephalus, extra axial fluid collection, or abnormal intracranial enhancement is identified. There is moderate cerebral atrophy. Major intracranial vascular flow voids are preserved. ORBITS: Bilateral cataract extraction. SINUSES: No acute abnormality. BONES AND SOFT TISSUES: Normal bone marrow signal and enhancement. No acute soft tissue abnormality. IMPRESSION: 1. Small acute to early subacute  bilateral occipital infarcts. 2. Mild chronic small vessel ischemic disease and moderate cerebral atrophy. 3. No evidence of intracranial metastatic disease. Electronically signed by: Dasie Hamburg MD 05/27/2024 03:46 PM EST RP Workstation: [id number]   CT Head Wo Contrast Result Date: 05/27/2024 EXAM: CT HEAD WITHOUT CONTRAST 05/27/2024 12:58:28 PM TECHNIQUE: CT of the head was performed without the administration of intravenous contrast. Automated exposure control, iterative reconstruction, and/or weight based adjustment of the mA/kV was utilized to reduce the radiation dose to as low as reasonably achievable. COMPARISON: None available. CLINICAL HISTORY: Mental status change, unknown cause. FINDINGS: BRAIN AND VENTRICLES: No acute hemorrhage. No evidence of acute infarct. No hydrocephalus. No extra-axial collection. No mass effect or midline shift. There is age-related atrophy and mild-to-moderate periventricular white matter disease. There is moderate calcific atheromatous disease within the carotid siphons. ORBITS: No acute abnormality. Patient is status post bilateral lens replacement. SINUSES: No acute abnormality. SOFT TISSUES AND SKULL: No acute soft tissue abnormality. No skull fracture. IMPRESSION: 1. No acute intracranial abnormality. 2. Age-related atrophy and mild-to-moderate periventricular white matter disease. 3. Moderate calcific atheromatous disease within the carotid siphons. Electronically signed by: Evalene Coho MD 05/27/2024 01:02 PM EST RP Workstation: [id number]    Scheduled Meds:  artificial tears  1 drop Both Eyes QID   [START ON 05/29/2024] aspirin EC  81 mg Oral Daily   atorvastatin  10 mg Oral QPM   [START ON 05/29/2024] clopidogrel  75 mg Oral Daily   enoxaparin  (LOVENOX ) injection  40 mg Subcutaneous Q24H   tamsulosin  0.4 mg Oral QPC supper   Continuous Infusions:  sodium chloride  35 mL/hr at 05/28/24 1409    LOS: 1 day   Time spent: 56 mins  Micole Delehanty Vicci,  MD How to contact the [facility] Attending or Consulting provider 7A - 7P or covering provider during after hours 7P -7A, for this patient?  Check the care team in [facility] and look for a) attending/consulting TRH provider listed and b) the TRH team listed Log into [email or web address] to find provider on call.  Locate the TRH provider you are looking for under [facility] and page to a number that you can be directly reached. If you still have difficulty reaching the provider, please page the [facility] (Director on Call) for the Hospitalists listed on amion for assistance.  05/28/2024, 2:24 PM

## 2024-05-28 NOTE — Progress Notes (Signed)
  Echocardiogram 2D Echocardiogram has been performed.  Koleen KANDICE Popper, RDCS 05/28/2024, 12:11 PM

## 2024-05-29 ENCOUNTER — Inpatient Hospital Stay

## 2024-05-29 DIAGNOSIS — I63533 Cerebral infarction due to unspecified occlusion or stenosis of bilateral posterior cerebral arteries: Secondary | ICD-10-CM

## 2024-05-29 DIAGNOSIS — C689 Malignant neoplasm of urinary organ, unspecified: Secondary | ICD-10-CM | POA: Diagnosis not present

## 2024-05-29 DIAGNOSIS — G9340 Encephalopathy, unspecified: Secondary | ICD-10-CM | POA: Diagnosis not present

## 2024-05-29 DIAGNOSIS — I10 Essential (primary) hypertension: Secondary | ICD-10-CM | POA: Diagnosis not present

## 2024-05-29 DIAGNOSIS — I639 Cerebral infarction, unspecified: Secondary | ICD-10-CM | POA: Diagnosis not present

## 2024-05-29 DIAGNOSIS — R29702 NIHSS score 2: Secondary | ICD-10-CM

## 2024-05-29 DIAGNOSIS — I959 Hypotension, unspecified: Secondary | ICD-10-CM

## 2024-05-29 LAB — HEMOGLOBIN A1C
Hgb A1c MFr Bld: 5.7 % — ABNORMAL HIGH (ref 4.8–5.6)
Mean Plasma Glucose: 117 mg/dL

## 2024-05-29 LAB — CBC
HCT: 28.7 % — ABNORMAL LOW (ref 39.0–52.0)
Hemoglobin: 9.2 g/dL — ABNORMAL LOW (ref 13.0–17.0)
MCH: 31.2 pg (ref 26.0–34.0)
MCHC: 32.1 g/dL (ref 30.0–36.0)
MCV: 97.3 fL (ref 80.0–100.0)
Platelets: 308 K/uL (ref 150–400)
RBC: 2.95 MIL/uL — ABNORMAL LOW (ref 4.22–5.81)
RDW: 18.6 % — ABNORMAL HIGH (ref 11.5–15.5)
WBC: 8.4 K/uL (ref 4.0–10.5)
nRBC: 0 % (ref 0.0–0.2)

## 2024-05-29 LAB — BASIC METABOLIC PANEL WITH GFR
Anion gap: 11 (ref 5–15)
BUN: 18 mg/dL (ref 8–23)
CO2: 23 mmol/L (ref 22–32)
Calcium: 8.7 mg/dL — ABNORMAL LOW (ref 8.9–10.3)
Chloride: 103 mmol/L (ref 98–111)
Creatinine, Ser: 0.76 mg/dL (ref 0.61–1.24)
GFR, Estimated: >60 mL/min (ref >60)
Glucose, Bld: 86 mg/dL (ref 70–99)
Potassium: 3.5 mmol/L (ref 3.5–5.1)
Sodium: 137 mmol/L (ref 135–145)

## 2024-05-29 MED ORDER — ATORVASTATIN CALCIUM 20 MG PO TABS
20.0000 mg | ORAL_TABLET | Freq: Every evening | ORAL | Status: DC
  Administered 2024-05-29 – 2024-05-31 (×3): 20 mg via ORAL
  Filled 2024-05-29 (×3): qty 1

## 2024-05-29 MED ORDER — CHLORHEXIDINE GLUCONATE CLOTH 2 % EX PADS
6.0000 | MEDICATED_PAD | Freq: Every day | CUTANEOUS | Status: DC
  Administered 2024-05-30 – 2024-06-01 (×3): 6 via TOPICAL

## 2024-05-29 NOTE — Progress Notes (Signed)
 [facility] Liaison Note:   We recently received a referral to provide outpatient home health services (SN, PT,SW, HHA) for Mr. Stollings. He was hospitalized prior to services being started.    Hospital liaisons will follow while he remains in the hospital.   Please call with any hospice, outpatient palliative care or home health related questions. Please provide home health orders at discharge.   Eleanor Nail, LPN [facility] Liaison [phone]

## 2024-05-29 NOTE — Evaluation (Signed)
 Occupational Therapy Evaluation Patient Details Name: Dean Randolph MRN: [id number] DOB: August 20, 1940 Today's Date: 05/29/2024   History of Present Illness   [name] is a 83 y.Dean. 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  Patient recently treated for UTI 6 days ago and presents with worsening confusion.  His confusion was worse over the last 24 hours.  He has also had decreased appetite and oral intake.  No fevers, chills, nausea, vomiting.     MRI shows new strokes.  History obtained by daughter (per DO)     Clinical Impressions Pt agreeable to OT and PT co-evaluation. Pt is independent at baseline without AD. Pt required mod to max A for EOB to chair transfer without AD. Improved balance noted with RW but pt still had a moment of loss of balance with the RW that likely would have resulted in a fall without assist. Mild R UE gross motor deficit noted. R UE also weak but pt reports also being limited by R flank pain. Pt struggled with visual tracking and keeping fixation on an object in lateral field of view. The pt's medical and functional status supports admission to an inpatient rehabilitation program due to multifactorial deficits, continued progress will require a coordinated, team-based approach with physician oversight to achieve meaningful gains in mobility, ADL independence, and safety. Pt left on the toilet with family present and RN notified. Pt will benefit from continued OT in the hospital to increase strength, balance, and endurance for safe ADL's.         If plan is discharge home, recommend the following:   A lot of help with walking and/or transfers;A little help with bathing/dressing/bathroom;Assistance with cooking/housework;Assist for transportation;Help with stairs or ramp for entrance     Functional Status Assessment   Patient has had a recent decline in  their functional status and demonstrates the ability to make significant improvements in function in a reasonable and predictable amount of time.     Equipment Recommendations   None recommended by OT     Recommendations for Other Services   Other (comment) (Evaluation from neuro vision specialist.)     Precautions/Restrictions   Precautions Precautions: Fall Recall of Precautions/Restrictions: Intact Restrictions Weight Bearing Restrictions Per Provider Order: No     Mobility Bed Mobility Overal bed mobility: Needs Assistance Bed Mobility: Supine to Sit     Supine to sit: Min assist, Mod assist     General bed mobility comments: labored; trunk control assist    Transfers Overall transfer level: Needs assistance   Transfers: Sit to/from Stand, Bed to chair/wheelchair/BSC Sit to Stand: Mod assist, Max assist     Step pivot transfers: Mod assist     General transfer comment: EOB to chair without AD      Balance Overall balance assessment: Needs assistance Sitting-balance support: No upper extremity supported, Feet supported Sitting balance-Leahy Scale: Fair Sitting balance - Comments: fair at EOB   Standing balance support: No upper extremity supported, During functional activity Standing balance-Leahy Scale: Poor Standing balance comment: poor without AD                           ADL either performed or assessed with clinical judgement   ADL Overall ADL's : Needs assistance/impaired Eating/Feeding: Independent;Sitting   Grooming: Set up;Sitting   Upper Body Bathing: Set up;Sitting   Lower Body Bathing:  Set up;Contact guard assist;Sitting/lateral leans   Upper Body Dressing : Set up;Sitting   Lower Body Dressing: Set up;Contact guard assist;Sitting/lateral leans Lower Body Dressing Details (indicate cue type and reason): Able to manage socks seated in chair without physical assist. Toilet Transfer: Moderate  assistance;Stand-pivot;Rolling walker (2 wheels) Toilet Transfer Details (indicate cue type and reason): EOB to chair with RW and ambulation in the room with RW Toileting- Clothing Manipulation and Hygiene: Maximal assistance;Sit to/from stand Toileting - Clothing Manipulation Details (indicate cue type and reason): Assisted for peri-care while standing with assist from physical therapy. Tub/ Shower Transfer: Moderate assistance;Stand-pivot;Tub bench   Functional mobility during ADLs: Minimal assistance;Rolling walker (2 wheels) General ADL Comments: Able to ambulate ~40 feet just outside the room and back to chair with RW.     Vision Baseline Vision/History: 1 Wears glasses Ability to See in Adequate Light: 1 Impaired Patient Visual Report: No change from baseline Vision Assessment?: Wears glasses for reading;Yes Alignment/Gaze Preference: Within Defined Limits Tracking/Visual Pursuits:  (Delayed tracking throughout testing in all quadrants with both eyes.) Saccades: Other (comment) (Not formally tested but pt had trouble fixating vision on a pen in lateral aspect of vision.) Convergence: Impaired (comment) (No convergence noted when pt propted to do so by following a pen.) Visual Fields: No apparent deficits     Perception Perception: Not tested       Praxis Praxis: Not tested       Pertinent Vitals/Pain Pain Assessment Pain Assessment: Faces Faces Pain Scale: Hurts a little bit Pain Location: R flank with movement Pain Descriptors / Indicators: Discomfort Pain Intervention(s): Monitored during session     Extremity/Trunk Assessment Upper Extremity Assessment Upper Extremity Assessment: Generalized weakness;RUE deficits/detail RUE Deficits / Details: 3+/5 MMT but limited by R flank pain. Difficult to make an accurate assessment due to R flank pain confounding variable. MIld gross motor difficult for finger to nose test. RUE Sensation: WNL RUE Coordination: decreased gross  motor   Lower Extremity Assessment Lower Extremity Assessment: Defer to PT evaluation RLE Deficits / Details: Hip Flexion 4/5, Knee flexion/extension 4+/5, ABD/ADD 4+/5, DF/PF 5/5 RLE Sensation: decreased light touch RLE Coordination: WNL LLE Deficits / Details: Hip Flexion 4-/5, Knee flexion/extension 4+/5, ABD/ADD 4+/5, DF/PF 5/5 LLE Sensation: decreased light touch LLE Coordination: WNL   Cervical / Trunk Assessment Cervical / Trunk Assessment: Kyphotic   Communication Communication Communication: Impaired Factors Affecting Communication: Reduced clarity of speech   Cognition Arousal: Alert Behavior During Therapy: WFL for tasks assessed/performed Cognition: No apparent impairments                               Following commands: Intact       Cueing  General Comments   Cueing Techniques: Verbal cues;Tactile cues                 Home Living Family/patient expects to be discharged to:: Private residence Living Arrangements: Spouse/significant other;Children Available Help at Discharge: Family;Available 24 hours/day Type of Home: House Home Access: Level entry     Home Layout: Laundry or work area in basement     [employer] Shower/Tub: Arts Development Officer Toilet: Handicapped height Bathroom Accessibility: No   Home Equipment: Agricultural Consultant (2 wheels);BSC/3in1;Shower seat;Wheelchair - manual;Cane - single point;Grab bars - toilet;Grab bars - tub/shower   Additional Comments: Daugher lives near the home.      Prior Functioning/Environment Prior Level of Function : Independent/Modified Independent  Mobility Comments: Pt reports as community ambulator without AD. The past 2 weeks the pt has needed physical assist to stand. ADLs Comments: Reports independence with ADLs/iADLs    OT Problem List: Decreased strength;Decreased activity tolerance;Impaired balance (sitting and/or standing);Impaired vision/perception;Decreased  coordination;Pain   OT Treatment/Interventions: Self-care/ADL training;Therapeutic exercise;DME and/or AE instruction;Neuromuscular education;Therapeutic activities;Visual/perceptual remediation/compensation;Patient/family education;Balance training      OT Goals(Current goals can be found in the care plan section)   Acute Rehab OT Goals Patient Stated Goal: Improve function OT Goal Formulation: With patient/family Time For Goal Achievement: 06/12/24 Potential to Achieve Goals: Fair   OT Frequency:  Min 3X/week    Co-evaluation PT/OT/SLP Co-Evaluation/Treatment: Yes Reason for Co-Treatment: To address functional/ADL transfers PT goals addressed during session: Mobility/safety with mobility OT goals addressed during session: ADL's and self-care                       End of Session Equipment Utilized During Treatment: Rolling walker (2 wheels);Gait belt Nurse Communication: Other (comment);Mobility status (Notified pt was having a bowel movement on the toilet.)  Activity Tolerance: Patient tolerated treatment well Patient left: Other (comment);with family/visitor present (Pt left on the toilet having a bowel movement with family present.)  OT Visit Diagnosis: Unsteadiness on feet (R26.81);Other abnormalities of gait and mobility (R26.89);Muscle weakness (generalized) (M62.81)                Time: 8480-8452 OT Time Calculation (min): 28 min Charges:  OT General Charges $OT Visit: 1 Visit OT Evaluation $OT Eval Low Complexity: 1 Low  Melquiades Kovar OT, MOT  Jayson Person 05/29/2024, 4:36 PM

## 2024-05-29 NOTE — Progress Notes (Addendum)
 PROGRESS NOTE   Dean Randolph  FMW:[id number] DOB: [birth year] DOA: 05/27/2024 PCP: Davonna Siad, MD   Chief Complaint  Patient presents with   Altered Mental Status   Level of care: Telemetry  Brief Admission History:  As per H&P written by Dr. Barbra on 05/27/2024  Dean Randolph is a 83 y.o. 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  Patient recently treated for UTI 6 days ago and presents with worsening confusion.  His confusion was worse over the last 24 hours.  He has also had decreased appetite and oral intake.  No fevers, chills, nausea, vomiting.   MRI shows new strokes.  History obtained by daughter  Assessment and Plan:  Acute CVA Acute metabolic encephalopathy -- MRI positive for small acute to early subacute bilateral occipital infarcts -- Case has been discussed with neurology service with recommendation for 90 days of dual antiplatelet therapy and following that just daily aspirin. - Will also continue statin - Continue controlling blood pressure and further risk factor modifications. -- Appreciate assistance and recommendation by neurology service. - 2D echo demonstrating preserved ejection fraction and no significant valvular disorder - PT/OT recommending CIR.  Essential hypertension  -Continue current antihypertensive agents and follow vital signs.  urothelial carcinoma  -- metastasis to bone and lymph nodes -- he has been receiving enfortumab vedotin  and pembrolizumab  -- he is started on palliative radiation for pain control. - Continue outpatient follow-up with oncology service.  DVT prophylaxis: enoxaparin   Code Status: DNR  DNI  Family Communication: daughter at bedside updated  Disposition: TBD  Consultants:  Neurology  Procedures:  See below for x-ray reports.  Antimicrobials: None.   Subjective: Calm, afebrile, stable and  following commands.  In no acute distress.  Objective: Vitals:   05/29/24 0800 05/29/24 1000 05/29/24 1500 05/29/24 1600  BP: (!) 142/64 (!) 117/53 127/67 103/62  Pulse: 91 85 95   Resp: 18 (!) 21 19   Temp:      TempSrc:      SpO2: 100% 100% 100%   Weight:      Height:        Intake/Output Summary (Last 24 hours) at 05/29/2024 1632 Last data filed at 05/29/2024 1536 Gross per 24 hour  Intake 556.88 ml  Output 1000 ml  Net -443.12 ml   Filed Weights   05/27/24 1222  Weight: 65.3 kg   Examination: General exam: Alert, awake, following commands appropriately; patient is weak/deconditioned.  In no acute distress. Respiratory system: Air movement bilaterally; good saturation on room air; no using accessory muscles. Cardiovascular system: Rate controlled, no rubs, no gallops, no JVD appreciated on exam. Gastrointestinal system: Abdomen is nondistended, soft and nontender. No organomegaly or masses felt. Normal bowel sounds heard. Central nervous system: Moving 4 limbs spontaneously; no focal neurological deficits. Extremities: No cyanosis, clubbing or edema. Skin: No petechiae Psychiatry: Judgement and insight appear normal.  Flat affect appreciated on exam.  Data Reviewed: I have personally reviewed following labs and imaging studies  CBC: Recent Labs  Lab 05/27/24 1326 05/29/24 0701  WBC 9.5 8.4  NEUTROABS 7.6  --   HGB 9.4* 9.2*  HCT 27.4* 28.7*  MCV 100.4* 97.3  PLT 317 308    Basic Metabolic Panel: Recent Labs  Lab 05/27/24 1326 05/29/24 0355  NA 136 137  K 4.3 3.5  CL 99 103  CO2 25 23  GLUCOSE 102* 86  BUN  23 18  CREATININE 0.86 0.76  CALCIUM 9.6 8.7*    CBG: No results for input(s): GLUCAP in the last 168 hours.  Recent Results (from the past 240 hours)  Urine Culture     Status: Abnormal (Preliminary result)   Collection Time: 05/27/24 12:36 PM   Specimen: Urine, Random  Result Value Ref Range Status   Specimen Description   Final     URINE, RANDOM Performed at [facility], [address] [place], [place] [address]    Special Requests   Final    NONE Reflexed from [id number] Performed at [facility], [address], [place], [place] [address]    Culture (A)  Final    20,000 COLONIES/mL STENOTROPHOMONAS MALTOPHILIA Sent to [facility] for further susceptibility testing. Performed at [facility], [address] [place], [place] [address]    Report Status PENDING  Incomplete  MRSA Next Gen by PCR, Nasal     Status: None   Collection Time: 05/27/24  6:15 PM   Specimen: Nasal Mucosa; Nasal Swab  Result Value Ref Range Status   MRSA by PCR Next Gen NOT DETECTED NOT DETECTED Final    Comment: (NOTE) The GeneXpert MRSA Assay (FDA approved for NASAL specimens only), is one component of a comprehensive MRSA colonization surveillance program. It is not intended to diagnose MRSA infection nor to guide or monitor treatment for MRSA infections. Test performance is not FDA approved in patients less than 9 years old. Performed at [facility], [address], [place], [place] [address]      Radiology Studies: ECHOCARDIOGRAM COMPLETE Result Date: 05/28/2024    ECHOCARDIOGRAM REPORT   Patient Name:   Dean Randolph Date of Exam: 05/28/2024 Medical Rec #:  [id number]       Height:       69.0 in Accession #:    [id number]      Weight:       144.0 lb Date of Birth:  1940/12/14      BSA:          1.797 m Patient Age:    83 years        BP:           142/75 mmHg Patient Gender: M               HR:           100 bpm. Exam Location:  [name] Procedure: 2D Echo, Cardiac Doppler and Color Doppler (Both Spectral and Color            Flow Doppler were utilized during procedure). Indications:    Stroke I63.9  History:        Patient has no prior history of Echocardiogram examinations.                 Stroke and cancer, Signs/Symptoms:Altered Mental Status; Risk                 Factors:Hypertension and Dyslipidemia.  Sonographer:    Koleen Popper RDCS Referring Phys: [phone] JACOB J STINSON IMPRESSIONS  1. Left ventricular ejection fraction, by estimation, is 55 to 60%. The left ventricle has normal function. The left ventricle has no regional wall motion abnormalities. Left ventricular diastolic parameters were normal.  2. Right ventricular systolic function is normal. The right ventricular size is normal.  3. The mitral valve is normal in structure. No evidence of mitral valve regurgitation. No evidence of mitral stenosis.  4. The aortic  valve is tricuspid. There is mild calcification of the aortic valve. There is mild thickening of the aortic valve. Aortic valve regurgitation is not visualized. Aortic valve sclerosis/calcification is present, without any evidence of aortic stenosis. FINDINGS  Left Ventricle: Left ventricular ejection fraction, by estimation, is 55 to 60%. The left ventricle has normal function. The left ventricle has no regional wall motion abnormalities. The left ventricular internal cavity size was normal in size. There is  no left ventricular hypertrophy. Left ventricular diastolic parameters were normal. Right Ventricle: The right ventricular size is normal. No increase in right ventricular wall thickness. Right ventricular systolic function is normal. Left Atrium: Left atrial size was normal in size. Right Atrium: Right atrial size was normal in size. Pericardium: There is no evidence of pericardial effusion. Mitral Valve: The mitral valve is normal in structure. Mild mitral annular calcification. No evidence of mitral valve regurgitation. No evidence of mitral valve stenosis. Tricuspid Valve: The tricuspid valve is normal in structure. Tricuspid valve regurgitation is not demonstrated. Aortic Valve: The aortic valve is tricuspid. There is mild calcification of the aortic valve. There is mild thickening of the aortic valve. Aortic valve regurgitation is not visualized. Aortic valve sclerosis/calcification is present, without any  evidence of aortic stenosis. Pulmonic Valve: The pulmonic valve was grossly normal. Pulmonic valve regurgitation is not visualized. No evidence of pulmonic stenosis. Aorta: The aortic root and ascending aorta are structurally normal, with no evidence of dilitation. IAS/Shunts: No atrial level shunt detected by color flow Doppler.  LEFT VENTRICLE PLAX 2D LVIDd:         3.80 cm   Diastology LVIDs:         2.70 cm   LV e' medial:    5.22 cm/s LV PW:         1.00 cm   LV E/e' medial:  11.3 LV IVS:        1.10 cm   LV e' lateral:   8.49 cm/s LVOT diam:     1.80 cm   LV E/e' lateral: 7.0 LV SV:         43 LV SV Index:   24 LVOT Area:     2.54 cm  RIGHT VENTRICLE RV S prime:     20.10 cm/s TAPSE (M-mode): 2.0 cm LEFT ATRIUM           Index LA diam:      3.20 cm 1.78 cm/m LA Vol (A2C): 27.4 ml 15.25 ml/m  AORTIC VALVE LVOT Vmax:   106.00 cm/s LVOT Vmean:  65.200 cm/s LVOT VTI:    0.169 m  AORTA Ao Root diam: 3.10 cm Ao Asc diam:  3.20 cm MITRAL VALVE MV Area (PHT): 4.49 cm     SHUNTS MV Decel Time: 169 msec     Systemic VTI:  0.17 m MV E velocity: 59.10 cm/s   Systemic Diam: 1.80 cm MV A velocity: 109.00 cm/s MV E/A ratio:  0.54 Mihai Croitoru MD Electronically signed by Jerel Balding MD Signature Date/Time: 05/28/2024/2:10:34 PM    Final    CT ANGIO HEAD NECK W WO CM Result Date: 05/28/2024 EXAM: CTA HEAD AND NECK WITHOUT AND WITH 05/28/2024 09:09:58 AM TECHNIQUE: CTA of the head and neck was performed without and with the administration of 75 mL of intravenous iohexol  (OMNIPAQUE ) 350 MG/ML injection. Multiplanar 2D and/or 3D reformatted images are provided for review. Automated exposure control, iterative reconstruction, and/or weight based adjustment of the mA/kV was utilized to reduce the radiation dose  to as low as reasonably achievable. Stenosis of the internal carotid arteries measured using NASCET criteria. COMPARISON: CT and MRI of the head dated 05/27/2024. Recent CT angiogram of the chest performed  05/21/2024. CLINICAL HISTORY: Neuro deficit, acute, stroke suspected. FINDINGS: CTA NECK: AORTIC ARCH AND ARCH VESSELS: There is mild calcific plaque within the aortic arch. No dissection or arterial injury. No significant stenosis of the brachiocephalic or subclavian arteries. CERVICAL CAROTID ARTERIES: There is calcific and noncalcific plaque present within the origin of the right internal carotid artery, particularly along the posterior wall, with approximately 40% luminal stenosis. The cervical segment of the right internal carotid artery is otherwise normal in caliber. There is calcific and noncalcific plaque within the posterolateral origin of the left internal carotid artery, with less than 10% luminal stenosis. The remainder of the cervical segment is normal in caliber. No dissection or arterial injury. CERVICAL VERTEBRAL ARTERIES: The left vertebral artery is dominant. Both vertebral arteries are normal in caliber throughout their respective courses. No dissection, arterial injury, or significant stenosis. LUNGS AND MEDIASTINUM: Mediastinal lymphadenopathy, better demonstrated on recent CT angiogram of the chest performed 05/21/2024. SOFT TISSUES: No cervical lymphadenopathy. The patient is status post bilateral lens replacement. No acute abnormality. BONES: No acute abnormality. CTA HEAD: ANTERIOR CIRCULATION: There is mild calcific plaque within the carotid siphons and communicating segments of the internal carotid arteries, with mild stenosis less than 30%. No significant stenosis of the anterior cerebral arteries. No significant stenosis of the middle cerebral arteries. No aneurysm. POSTERIOR CIRCULATION: There is moderate luminal irregularity of the P1 segment of the left posterior cerebral artery with moderate stenosis. There is mild-to-moderate stenosis of the P2 segment of the right posterior cerebral artery. No significant stenosis of the basilar artery. No significant stenosis of the vertebral  arteries. No aneurysm. OTHER: Age-related atrophy and mild-to-moderate periventricular white matter disease. No dural venous sinus thrombosis on this non-dedicated study. IMPRESSION: 1. Moderate luminal irregularity of the P1 segment of the left posterior cerebral artery with moderate stenosis. 2. Mild-to-moderate stenosis of the P2 segment of the right posterior cerebral artery. 3. Calcific and noncalcific plaque within the origin of the right internal carotid artery, particularly along the posterior wall, with approximately 40% luminal stenosis. The cervical segment is otherwise normal in caliber. 4. Calcific and noncalcific plaque within the posterolateral origin of the left internal carotid artery, with less than 10% luminal stenosis. The remainder of the cervical segment is normal in caliber. 5. Mild calcific plaque within the carotid siphons and communicating segments of the internal carotid arteries, with mild stenosis less than 30%. Electronically signed by: Evalene Coho MD 05/28/2024 09:58 AM EST RP Workstation: [id number]    Scheduled Meds:  artificial tears  1 drop Both Eyes QID   aspirin  EC  81 mg Oral Daily   atorvastatin   20 mg Oral QPM   clopidogrel   75 mg Oral Daily   enoxaparin  (LOVENOX ) injection  40 mg Subcutaneous Q24H   tamsulosin   0.4 mg Oral QPC supper   Continuous Infusions:    LOS: 2 days   Time spent: 50 mins  Eric Nunnery, MD How to contact the [facility] Attending or Consulting provider 7A - 7P or covering provider during after hours 7P -7A, for this patient?  Check the care team in [facility] and look for a) attending/consulting [facility] provider listed and b) the [facility] team listed Log into [email or web address] to find provider on call.  Locate the [facility] provider you are looking for under [facility]  and page to a number that you can be directly reached. If you still have difficulty reaching the provider, please page the [facility] (Director on Call) for the Hospitalists listed on amion  for assistance.  05/29/2024, 4:32 PM

## 2024-05-29 NOTE — Plan of Care (Signed)
  Problem: Acute Rehab PT Goals(only PT should resolve) Goal: Pt Will Go Supine/Side To Sit Outcome: Progressing Flowsheets (Taken 05/29/2024 1612) Pt will go Supine/Side to Sit:  with contact guard assist  with supervision Goal: Patient Will Perform Sitting Balance Outcome: Progressing Flowsheets (Taken 05/29/2024 1612) Patient will perform sitting balance:  with contact guard assist  with supervision Goal: Patient Will Transfer Sit To/From Stand Outcome: Progressing Flowsheets (Taken 05/29/2024 1612) Patient will transfer sit to/from stand: with contact guard assist Goal: Pt Will Transfer Bed To Chair/Chair To Bed Outcome: Progressing Flowsheets (Taken 05/29/2024 1612) Pt will Transfer Bed to Chair/Chair to Bed: with contact guard assist Goal: Pt Will Perform Standing Balance Or Pre-Gait Outcome: Progressing Flowsheets (Taken 05/29/2024 1612) Pt will perform standing balance or pre-gait:  with contact guard assist  with unilateral UE support  with Supervision Goal: Pt Will Ambulate Outcome: Progressing Flowsheets (Taken 05/29/2024 1612) Pt will Ambulate:  50 feet  25 feet  with contact guard assist  with supervision  with rolling walker  Dean Randolph, SPT

## 2024-05-29 NOTE — Plan of Care (Signed)
  Problem: Acute Rehab OT Goals (only OT should resolve) Goal: Pt. Will Perform Grooming Flowsheets (Taken 05/29/2024 1639) Pt Will Perform Grooming:  with modified independence  standing Goal: Pt. Will Perform Lower Body Bathing Flowsheets (Taken 05/29/2024 1639) Pt Will Perform Lower Body Bathing: with modified independence Goal: Pt. Will Perform Lower Body Dressing Flowsheets (Taken 05/29/2024 1639) Pt Will Perform Lower Body Dressing: with modified independence Goal: Pt. Will Transfer To Toilet Flowsheets (Taken 05/29/2024 1639) Pt Will Transfer to Toilet:  with modified independence  ambulating Goal: Pt. Will Perform Toileting-Clothing Manipulation Flowsheets (Taken 05/29/2024 1639) Pt Will Perform Toileting - Clothing Manipulation and hygiene: with modified independence Goal: Pt/Caregiver Will Perform Home Exercise Program Flowsheets (Taken 05/29/2024 1639) Pt/caregiver will Perform Home Exercise Program:  Increased strength and gross motor coordination  Right Upper extremity  Left upper extremity  Independently Goal: OT Additional ADL Goal #1 Flowsheets (Taken 05/29/2024 1640) Additional ADL Goal #1: Pt will demonstrate improved visual motor skills by tracking an object with fluid eye movement and no delay.  Tyrese Ficek OT, MOT

## 2024-05-29 NOTE — Progress Notes (Signed)
 SLP Cancellation Note  Patient Details Name: Dean Randolph MRN: [id number] DOB: February 24, 1941   Cancelled treatment:       Reason Eval/Treat Not Completed: Other (comment) (Pt screened in room with his daughter present. He  was initially quite confused, but feels back to baseline. His daughter reports an occasionaly off topic verbalization, but otherwise close to baseline in terms of cognitive linguistic functioning.) Recommend f/u at next level of care to ensure resolution of recent confusion.   Thank you,  Lamar Candy, CCC-SLP [phone]    Chariah Bailey 05/29/2024, 4:47 PM

## 2024-05-29 NOTE — Consult Note (Signed)
 I connected with  Dean Randolph on 05/29/24 by a video enabled telemedicine application and verified that I am speaking with the correct person using two identifiers.   I discussed the limitations of evaluation and management by telemedicine. The patient expressed understanding and agreed to proceed.  NEUROLOGY CONSULT NOTE   Date of service: May 29, 2024 Patient Name: Dean Randolph MRN:  [id number] DOB:  January 16, 1941 Chief Complaint: Encephalopathy, found to have bilateral occipital infarcts Requesting Provider: Ricky Fines, MD  History of Present Illness  Dean Randolph is a 83 y.o. male with hx of bladder cancer (c/b met to lumbar and thoracic spine), HTN, who presents with encephalopathy.  Daughter at bedside who brought patient in reports that she received phone call from her mother on Friday (11/28) afternoon that patient was moving slower, appeared confused, anxious, jittery, and appeared to be sleeping with his eyes open. 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 This is in the setting of getting over a pneumonia. They called his cancer doctor who recommended coming to the ED.   Daughter believes he has improved drastically and is currently about 90% of his mental baseline.   LKW: 11/28 AM Modified rankin score: 4-Needs assistance to walk and tend to bodily needs IV Thrombolysis: No (out of window) EVT: No (out of window) ICH Score: N/A  NIHSS components Score: Comment  1a Level of Conscious 0[x]  1[]  2[]  3[]      1b LOC Questions 0[x]  1[]  2[]       1c LOC Commands 0[x]  1[]  2[]       2 Best Gaze 0[x]  1[]  2[]       3 Visual 0[x]  1[]  2[]  3[]      4 Facial Palsy 0[x]  1[]  2[]  3[]      5a Motor Arm - left 0[x]  1[]  2[]  3[]  4[]  UN[]    5b Motor Arm - Right 0[x]  1[]  2[]  3[]  4[]  UN[]    6a Motor Leg - Left 0[]  1[x]  2[]  3[]  4[]  UN[]    6b Motor Leg - Right 0[]  1[x]  2[]  3[]  4[]  UN[]    7 Limb Ataxia 0[x]  1[]  2[]  UN[]      8  Sensory 0[x]  1[]  2[]  UN[]      9 Best Language 0[x]  1[]  2[]  3[]      10 Dysarthria 0[x]  1[]  2[]  UN[]      11 Extinct. and Inattention 0[x]  1[]  2[]       TOTAL: 2      ROS  Comprehensive ROS performed and pertinent positives documented in HPI    Past History   Past Medical History:  Diagnosis Date   Bladder cancer (HCC)    Hypertension     Past Surgical History:  Procedure Laterality Date   BILIARY DILATION  03/12/2022   Procedure: BILIARY DILATION;  Surgeon: Wilhelmenia Aloha Raddle., MD;  Location: [facility];  Service: Gastroenterology;;   BILIARY STENT PLACEMENT N/A 02/06/2021   Procedure: BILIARY STENT PLACEMENT;  Surgeon: Golda Claudis PENNER, MD;  Location: [facility];  Service: Gastroenterology;  Laterality: N/A;   BILIARY STENT PLACEMENT N/A 03/26/2021   Procedure: BILIARY STENT PLACEMENT 10 FRENCH BY 9cm;  Surgeon: Golda Claudis PENNER, MD;  Location: [facility];  Service: Endoscopy;  Laterality: N/A;   BILIARY STENT PLACEMENT N/A 01/19/2022   Procedure: BILIARY STENT PLACEMENT;  Surgeon: Wilhelmenia Aloha Raddle., MD;  Location: [facility];  Service: Gastroenterology;  Laterality: N/A;   BIOPSY  01/19/2022   Procedure: BIOPSY;  Surgeon:  Mansouraty, Aloha Raddle., MD;  Location: [facility];  Service: Gastroenterology;;   CATARACT EXTRACTION Bilateral    CHOLECYSTECTOMY N/A 04/25/2021   Procedure: LAPAROSCOPIC CHOLECYSTECTOMY;  Surgeon: Mavis Anes, MD;  Location: [facility];  Service: General;  Laterality: N/A;   ENDOSCOPIC RETROGRADE CHOLANGIOPANCREATOGRAPHY (ERCP) WITH PROPOFOL  N/A 01/19/2022   Procedure: ENDOSCOPIC RETROGRADE CHOLANGIOPANCREATOGRAPHY (ERCP) WITH PROPOFOL ;  Surgeon: Wilhelmenia Aloha Raddle., MD;  Location: [facility];  Service: Gastroenterology;  Laterality: N/A;   ENDOSCOPIC RETROGRADE CHOLANGIOPANCREATOGRAPHY (ERCP) WITH PROPOFOL  N/A 03/12/2022   Procedure: ENDOSCOPIC RETROGRADE CHOLANGIOPANCREATOGRAPHY (ERCP) WITH PROPOFOL ;  Surgeon: Wilhelmenia Aloha Raddle., MD;   Location: [facility];  Service: Gastroenterology;  Laterality: N/A;   ERCP N/A 02/06/2021   Procedure: ENDOSCOPIC RETROGRADE CHOLANGIOPANCREATOGRAPHY (ERCP);  Surgeon: Golda Claudis PENNER, MD;  Location: [facility];  Service: Gastroenterology;  Laterality: N/A;   ERCP N/A 03/26/2021   Procedure: ENDOSCOPIC RETROGRADE CHOLANGIOPANCREATOGRAPHY (ERCP) with EXTENDED SPHINCTEROTOMY;  Surgeon: Golda Claudis PENNER, MD;  Location: [facility];  Service: Endoscopy;  Laterality: N/A;   ERCP N/A 10/27/2021   Procedure: ENDOSCOPIC RETROGRADE CHOLANGIOPANCREATOGRAPHY (ERCP);  Surgeon: Golda Claudis PENNER, MD;  Location: [facility];  Service: Endoscopy;  Laterality: N/A;  no site    GASTROINTESTINAL STENT REMOVAL N/A 03/26/2021   Procedure: GASTROINTESTINAL STENT REMOVAL;  Surgeon: Golda Claudis PENNER, MD;  Location: [facility];  Service: Endoscopy;  Laterality: N/A;   GASTROINTESTINAL STENT REMOVAL N/A 10/27/2021   Procedure: GASTROINTESTINAL STENT EXCHANGE;  Surgeon: Golda Claudis PENNER, MD;  Location: [facility];  Service: Endoscopy;  Laterality: N/A;  no site    REMOVAL OF STONES N/A 03/26/2021   Procedure: REMOVAL OF STONES WITH EXTRACTION BASKET;  Surgeon: Golda Claudis PENNER, MD;  Location: [facility];  Service: Endoscopy;  Laterality: N/A;   REMOVAL OF STONES N/A 10/27/2021   Procedure: REMOVAL OF STONES;  Surgeon: Golda Claudis PENNER, MD;  Location: [facility];  Service: Endoscopy;  Laterality: N/A;   REMOVAL OF STONES  01/19/2022   Procedure: REMOVAL OF STONES;  Surgeon: Wilhelmenia Aloha Raddle., MD;  Location: [facility];  Service: Gastroenterology;;   REMOVAL OF STONES  03/12/2022   Procedure: REMOVAL OF STONES;  Surgeon: Wilhelmenia Aloha Raddle., MD;  Location: [facility];  Service: Gastroenterology;;   ANNETT N/A 02/06/2021   Procedure: ANNETT;  Surgeon: Golda Claudis PENNER, MD;  Location: [facility];  Service: Gastroenterology;  Laterality: N/A;   SPHINCTEROTOMY N/A 10/27/2021   Procedure: SPHINCTEROTOMY;  Surgeon: Golda Claudis PENNER, MD;  Location: [facility];  Service: Endoscopy;  Laterality: N/A;   SPYGLASS CHOLANGIOSCOPY N/A 01/19/2022   Procedure: SPYGLASS CHOLANGIOSCOPY;  Surgeon: Wilhelmenia Aloha Raddle., MD;  Location: [facility];  Service: Gastroenterology;  Laterality: N/A;   SPYGLASS CHOLANGIOSCOPY N/A 03/12/2022   Procedure: DEBHOJDD CHOLANGIOSCOPY;  Surgeon: Wilhelmenia Aloha Raddle., MD;  Location: [facility];  Service: Gastroenterology;  Laterality: N/A;   SPYGLASS LITHOTRIPSY N/A 03/12/2022   Procedure: DEBHOJDD LITHOTRIPSY;  Surgeon: Wilhelmenia Aloha Raddle., MD;  Location: [facility];  Service: Gastroenterology;  Laterality: N/A;   STENT REMOVAL  01/19/2022   Procedure: STENT REMOVAL;  Surgeon: Wilhelmenia Aloha Raddle., MD;  Location: [facility];  Service: Gastroenterology;;   CLEDA REMOVAL  03/12/2022   Procedure: STENT REMOVAL;  Surgeon: Wilhelmenia Aloha Raddle., MD;  Location: [facility];  Service: Gastroenterology;;   STONE EXTRACTION WITH BASKET  01/19/2022   Procedure: STONE EXTRACTION WITH BASKET;  Surgeon: Wilhelmenia Aloha Raddle., MD;  Location: [facility];  Service: Gastroenterology;;    Family History: Family History  Problem Relation Age of Onset  Stroke Mother    Colon cancer Neg Hx    Esophageal cancer Neg Hx    Inflammatory bowel disease Neg Hx    Liver disease Neg Hx    Pancreatic cancer Neg Hx    Rectal cancer Neg Hx    Stomach cancer Neg Hx     Social History  reports that he has never smoked. He has been exposed to tobacco smoke. He has never used smokeless tobacco. He reports that he does not drink alcohol and does not use drugs.  No Known Allergies  Medications   Current Facility-Administered Medications:    acetaminophen  (TYLENOL ) tablet 650 mg, 650 mg, Oral, Q4H PRN **OR** acetaminophen  (TYLENOL ) 160 MG/5ML solution 650 mg, 650 mg, Per Tube, Q4H PRN **OR** acetaminophen  (TYLENOL ) suppository 650 mg, 650 mg, Rectal, Q4H PRN, Ricky Fines, MD   artificial tears  ophthalmic solution 1 drop, 1 drop, Both Eyes, QID, Ricky Fines, MD, 1 drop at 05/29/24 1333   aspirin EC tablet 81 mg, 81 mg, Oral, Daily, Ricky Fines, MD, 81 mg at 05/29/24 0949   atorvastatin (LIPITOR) tablet 20 mg, 20 mg, Oral, QPM, Ricky Fines, MD, 20 mg at 05/29/24 1732   [START ON 05/30/2024] Chlorhexidine  Gluconate Cloth 2 % PADS 6 each, 6 each, Topical, Q0600, Ricky Fines, MD   [COMPLETED] clopidogrel (PLAVIX) tablet 300 mg, 300 mg, Oral, Once, 300 mg at 05/28/24 1409 **FOLLOWED BY** clopidogrel (PLAVIX) tablet 75 mg, 75 mg, Oral, Daily, Ricky Fines, MD, 75 mg at 05/29/24 9050   enoxaparin  (LOVENOX ) injection 40 mg, 40 mg, Subcutaneous, Q24H, Ricky Fines, MD, 40 mg at 05/29/24 1733   senna-docusate (Senokot-S) tablet 1 tablet, 1 tablet, Oral, QHS PRN, Ricky Fines, MD   tamsulosin [facility]) capsule 0.4 mg, 0.4 mg, Oral, QPC supper, Ricky Fines, MD, 0.4 mg at 05/29/24 1732  Vitals   Vitals:   05/29/24 1600 05/29/24 1700 05/29/24 1800 05/29/24 2006  BP: 103/62 (!) 99/58 129/74 115/75  Pulse:   (!) 108 100  Resp:    18  Temp:  98 F (36.7 C)  98.1 F (36.7 C)  TempSrc:  Oral    SpO2:   95% 96%  Weight:      Height:        Body mass index is 21.26 kg/m.   Physical Exam   Constitutional: Thin, in no acute distress Psych: Affect appropriate to situation.  Eyes: No scleral injection.  HENT: No OP obstruction.  Head: Normocephalic. Cardiovascular: Normal rate and regular rhythm as per monitor Respiratory: Effort normal, non-labored breathing.  GI: No distension Skin: WDI on clothed exam.  Neurologic Examination   Mental status: Alert, able to follow commands  CN's PERRL EOMI, no nystagmus or diplopia or ptosis Face sensation intact No facial droop Hearing grossly not intact Shoulder elevation symmetric and strong Motor Grossly 5/5 delts, biceps, triceps, hand grip Grossly 5/5 hip flexion, dorsiflexion, plantarflexion Sensory Intact to light  touch in UE and LE Gait Deferred. Required mod assist with PT to walk. Was able to stand with min assist.   Labs/Imaging/Neurodiagnostic studies   CBC:  Recent Labs  Lab 2024/06/18 1326 05/29/24 0701  WBC 9.5 8.4  NEUTROABS 7.6  --   HGB 9.4* 9.2*  HCT 27.4* 28.7*  MCV 100.4* 97.3  PLT 317 308   Basic Metabolic Panel:  Lab Results  Component Value Date   NA 137 05/29/2024   K 3.5 05/29/2024   CO2 23 05/29/2024   GLUCOSE 86 05/29/2024   BUN  18 05/29/2024   CREATININE 0.76 05/29/2024   CALCIUM  8.7 (L) 05/29/2024   GFRNONAA >60 05/29/2024   Lipid Panel:  Lab Results  Component Value Date   LDLCALC 74 05/28/2024   HgbA1c:  Lab Results  Component Value Date   HGBA1C 5.7 (H) 05/28/2024   Urine Drug Screen: No results found for: LABOPIA, COCAINSCRNUR, LABBENZ, AMPHETMU, THCU, LABBARB  Alcohol  Level No results found for: [facility] INR  Lab Results  Component Value Date   INR 1.1 03/27/2024   APTT No results found for: APTT AED levels: No results found for: PHENYTOIN, ZONISAMIDE, LAMOTRIGINE, LEVETIRACETA  CT Head without contrast: No acute abnormality  CT angio Head and Neck with contrast: 1. Moderate luminal irregularity of the P1 segment of the left posterior cerebral artery with moderate stenosis. 2. Mild-to-moderate stenosis of the P2 segment of the right posterior cerebral artery. 3. Calcific and noncalcific plaque within the origin of the right internal carotid artery, particularly along the posterior wall, with approximately 40% luminal stenosis. The cervical segment is otherwise normal in caliber. 4. Calcific and noncalcific plaque within the posterolateral origin of the left internal carotid artery, with less than 10% luminal stenosis. The remainder of the cervical segment is normal in caliber. 5. Mild calcific plaque within the carotid siphons and communicating segments of the internal carotid arteries, with mild stenosis less than  30%.  MRI Brain w/wo contrast: 1. Small acute to early subacute bilateral occipital infarcts. 2. Mild chronic small vessel ischemic disease and moderate cerebral atrophy. 3. No evidence of intracranial metastatic disease.   ASSESSMENT   Dean Randolph is a 83 y.o. male with bladder cancer (with met to t and l-spines) who presents with encephalopathy, found to have bilateral occipital infarcts. This is in the setting of bilateral PCA stenoses found on CTA. MRI without concerns for metastatic disease to the brain. As per daughter, patient has history of dehydration. The occurrence of both occipital lobes being affected with stenoses suggests possible hypotension as cause. Echocardiogram was also without thrombus. Due to intracranial stenosis, would recommend DAPT x 90 days as per [facility]IS as well as maintaining good volume status.  RECOMMENDATIONS  - ASA 81mg  and plavix  75mg  for a total of 90 days  - Discussed with daughter to look for signs of any GI bleeding and if so to stop DAPT and to inform PCP/oncologist - Avoid hypotension  - Discussed with daughter that patient may need higher blood pressure to perfuse through his bilateral stenoses in the PCA's - Defer to PCP/oncologist re: pre-diabetes (A1C 5.7) - LDL 74; continue ator 20mg  (as patient > 76yo, would hold off on high intensity statin) - Continue on telemetry.   - If develop afib, will need to consider anticoagulation. ______________________________________________________________________    Signed, Dean Gulino M Garey Alleva, MD [facility]

## 2024-05-29 NOTE — Progress Notes (Signed)
 Pt admitted to unit from ICU. Vitals obtained and stable. Chronic foley intact and draining. Tele order active but indication for ICU /Stepdown. Msg to Dr. Adefeso if could be d/c. Confirmed can d/c. Order placed.

## 2024-05-29 NOTE — Evaluation (Signed)
Physical Therapy Evaluation Patient Details Name: Dean Randolph MRN: [id number] DOB: 08/18/1940 Today's Date: 05/29/2024  History of Present Illness  Dean Randolph is a 83 y.o. 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  Patient recently treated for UTI 6 days ago and presents with worsening confusion.  His confusion was worse over the last 24 hours.  He has also had decreased appetite and oral intake.  No fevers, chills, nausea, vomiting.     MRI shows new strokes.  History obtained by daughter    Clinical Impression  Pt. Presented w/ general weakness in LE due dx. Co-treatment w/ OT. Pt. Had difficulty understanding commands initially during treatment, and required cuing. Pt had difficulty w. Clarity of speech during treatment. Pt was able to perform bed mobility w/ min A to EOB. Initially pt did not use AD during transfer from bed to chair, and required mod A/max A for stability. When pt. Was in chair pt. RLE weaker compared to LLE  (seen down below). During ambulation w/RW and min A/ CGA pt. Was bale to ambulate into the hallway and back to [facility] at the ned of session. Pt. Family was bed side and nursing staff was notified on pt. Status. Patient will benefit from continued skilled physical therapy in hospital and recommended venue below to increase strength, balance, endurance for safe ADLs and gait.       If plan is discharge home, recommend the following: Two people to help with walking and/or transfers;Assistance with feeding;Help with stairs or ramp for entrance;A lot of help with bathing/dressing/bathroom   Can travel by private vehicle    Yes    Equipment Recommendations None recommended by PT  Recommendations for Other Services       Functional Status Assessment Patient has had a recent decline in their functional status and demonstrates the ability to make significant  improvements in function in a reasonable and predictable amount of time.     Precautions / Restrictions Precautions Precautions: Fall Recall of Precautions/Restrictions: Intact Restrictions Weight Bearing Restrictions Per Provider Order: No      Mobility  Bed Mobility Overal bed mobility: Needs Assistance Bed Mobility: Supine to Sit     Supine to sit: Min assist     General bed mobility comments: to EOB    Transfers Overall transfer level: Needs assistance Equipment used: Rolling walker (2 wheels) Transfers: Sit to/from Stand, Bed to chair/wheelchair/BSC Sit to Stand: Mod assist, Max assist   Step pivot transfers: Mod assist, Max assist       General transfer comment: w/o AD initally, from sit to stand from chair pt. used RW    Ambulation/Gait Ambulation/Gait assistance: Min assist, Contact guard assist Gait Distance (Feet): 20 Feet Assistive device: Rolling walker (2 wheels) Gait Pattern/deviations: Step-through pattern, Decreased step length - right, Decreased step length - left, Decreased stance time - right, Decreased stance time - left, Decreased stride length, Narrow base of support       General Gait Details: Pt. was able ambulate into hallway w/ RW towards the end, pt was able to ambulate to [facility]  Stairs            Wheelchair Mobility     Tilt Bed    Modified Rankin (Stroke Patients Only)       Balance Overall balance assessment: Needs assistance   Sitting balance-Leahy Scale: Fair Sitting balance - Comments: fair at EOB  Standing balance-Leahy Scale: Poor Standing balance comment: poor w/ AD, fair w/ RW                             Pertinent Vitals/Pain Pain Assessment Pain Assessment: Faces Faces Pain Scale: Hurts a little bit Pain Location: R. rib/ shoulder Pain Descriptors / Indicators: Discomfort    Home Living Family/patient expects to be discharged to:: Private residence Living Arrangements:  Spouse/significant other;Children Available Help at Discharge: Family;Available 24 hours/day Type of Home: House Home Access: Level entry       Home Layout: One level;Laundry or work area in [employer] Equipment: Agricultural Consultant (2 wheels);BSC/3in1;Shower seat;Wheelchair - manual;Cane - single point;Grab bars - toilet;Grab bars - tub/shower Additional Comments: Lives w/ wife and daughter was at bedside    Prior Function Prior Level of Function : Independent/Modified Independent             Mobility Comments: 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, reports no falls ADLs Comments: Reports independence with ADLs/iADLs     Extremity/Trunk Assessment   Upper Extremity Assessment Upper Extremity Assessment: Defer to OT evaluation    Lower Extremity Assessment Lower Extremity Assessment: RLE deficits/detail;LLE deficits/detail RLE Deficits / Details: Hip Flexion 4/5, Knee flexion/extension 4+/5, ABD/ADD 4+/5, DF/PF 5/5 RLE Sensation: decreased light touch RLE Coordination: WNL LLE Deficits / Details: Hip Flexion 4-/5, Knee flexion/extension 4+/5, ABD/ADD 4+/5, DF/PF 5/5 LLE Sensation: decreased light touch LLE Coordination: WNL    Cervical / Trunk Assessment Cervical / Trunk Assessment: Kyphotic  Communication   Communication Communication: Impaired Factors Affecting Communication: Reduced clarity of speech    Cognition Arousal: Alert Behavior During Therapy: WFL for tasks assessed/performed   PT - Cognitive impairments: No apparent impairments                         Following commands: Intact       Cueing Cueing Techniques: Verbal cues, Tactile cues     General Comments      Exercises     Assessment/Plan    PT Assessment Patient needs continued PT services  PT Problem List Decreased strength;Decreased range of motion;Decreased activity tolerance;Decreased safety awareness;Decreased balance;Decreased mobility;Decreased coordination        PT Treatment Interventions DME instruction;Gait training;Stair training;Functional mobility training;Therapeutic activities;Therapeutic exercise;Balance training;Patient/family education    PT Goals (Current goals can be found in the Care Plan section)  Acute Rehab PT Goals Patient Stated Goal: wants to return home PT Goal Formulation: With patient/family Time For Goal Achievement: 06/11/2024 Potential to Achieve Goals: Good    Frequency Min 3X/week     Co-evaluation PT/OT/SLP Co-Evaluation/Treatment: Yes Reason for Co-Treatment: To address functional/ADL transfers PT goals addressed during session: Mobility/safety with mobility         AM-PAC PT 6 Clicks Mobility  Outcome Measure Help needed turning from your back to your side while in a flat bed without using bedrails?: A Little Help needed moving from lying on your back to sitting on the side of a flat bed without using bedrails?: A Little Help needed moving to and from a bed to a chair (including a wheelchair)?: A Little Help needed standing up from a chair using your arms (e.g., wheelchair or bedside chair)?: A Lot Help needed to walk in hospital room?: A Little Help needed climbing 3-5 steps with a railing? : Total 6 Click Score: 15    End of Session Equipment Utilized  During Treatment: Gait belt Activity Tolerance: Patient tolerated treatment well;Patient limited by fatigue Patient left: with family/visitor present;with call bell/phone within reach;Other (comment) (BSC, w/ nursing staff)   PT Visit Diagnosis: Other abnormalities of gait and mobility (R26.89);Muscle weakness (generalized) (M62.81);History of falling (Z91.81)    Time: 8483-8453 PT Time Calculation (min) (ACUTE ONLY): 30 min   Charges:   PT Evaluation $PT Eval Moderate Complexity: 1 Mod PT Treatments $Therapeutic Activity: 23-37 mins PT General Charges $$ ACUTE PT VISIT: 1 Visit         Neera Teng, SPT

## 2024-05-30 DIAGNOSIS — I639 Cerebral infarction, unspecified: Secondary | ICD-10-CM | POA: Diagnosis not present

## 2024-05-30 DIAGNOSIS — G9340 Encephalopathy, unspecified: Secondary | ICD-10-CM | POA: Diagnosis not present

## 2024-05-30 DIAGNOSIS — C689 Malignant neoplasm of urinary organ, unspecified: Secondary | ICD-10-CM | POA: Diagnosis not present

## 2024-05-30 DIAGNOSIS — I10 Essential (primary) hypertension: Secondary | ICD-10-CM | POA: Diagnosis not present

## 2024-05-30 MED ORDER — ENSURE PLUS HIGH PROTEIN PO LIQD
237.0000 mL | Freq: Two times a day (BID) | ORAL | Status: DC
  Administered 2024-05-30 – 2024-06-01 (×5): 237 mL via ORAL

## 2024-05-30 MED ORDER — GUAIFENESIN-DM 100-10 MG/5ML PO SYRP
5.0000 mL | ORAL_SOLUTION | ORAL | Status: DC | PRN
  Administered 2024-05-30 – 2024-05-31 (×3): 5 mL via ORAL
  Filled 2024-05-30 (×4): qty 5

## 2024-05-30 NOTE — Progress Notes (Signed)
 Occupational Therapy Treatment Patient Details Name: Dean Randolph MRN: [id number] DOB: June 20, 1941 Today's Date: 05/30/2024   History of present illness Dean Randolph is a 83 y.o. 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  Patient recently treated for UTI 6 days ago and presents with worsening confusion.  His confusion was worse over the last 24 hours.  He has also had decreased appetite and oral intake.  No fevers, chills, nausea, vomiting.     MRI shows new strokes.  History obtained by daughter   OT comments  Pt agreeable to OT and PT co-treatment. Pt able to ambulate in the hall with RW and min A. Pt able to groom at the sink while standing with min A due to balance deficits if not holding onto the RW. Pt left in the bed with family present. Pt will benefit from continued OT in the hospital to increase strength, balance, and endurance for safe ADL's.         If plan is discharge home, recommend the following:  A little help with bathing/dressing/bathroom;Assistance with cooking/housework;Assist for transportation;Help with stairs or ramp for entrance;A little help with walking and/or transfers   Equipment Recommendations  None recommended by OT          Precautions / Restrictions Precautions Precautions: Fall Recall of Precautions/Restrictions: Intact Restrictions Weight Bearing Restrictions Per Provider Order: No       Mobility Bed Mobility Overal bed mobility: Needs Assistance Bed Mobility: Supine to Sit     Supine to sit: Min assist     General bed mobility comments: Assist to pull to sit.    Transfers Overall transfer level: Needs assistance Equipment used: Rolling walker (2 wheels) Transfers: Sit to/from Stand Sit to Stand: Contact guard assist           General transfer comment: Sit to stand from EOB with CGA.     Balance Overall balance  assessment: Needs assistance Sitting-balance support: No upper extremity supported, Feet supported Sitting balance-Leahy Scale: Fair Sitting balance - Comments: fair to good at EOB   Standing balance support: Bilateral upper extremity supported, During functional activity Standing balance-Leahy Scale: Poor Standing balance comment: poor to fair using RW                           ADL either performed or assessed with clinical judgement   ADL Overall ADL's : Needs assistance/impaired     Grooming: Wash/dry hands;Standing;Minimal assistance Grooming Details (indicate cue type and reason): Min A due to balance issues whiel standing at the sink with hands off the RW.                             Functional mobility during ADLs: Minimal assistance;Rolling walker (2 wheels) General ADL Comments: Pt ambualted in the room and hall with min A using RW.      Cognition Arousal: Alert Behavior During Therapy: WFL for tasks assessed/performed Cognition: No apparent impairments                               Following commands: Intact                               Pertinent Vitals/  Pain       Pain Assessment Pain Assessment: No/denies pain                                                          Frequency  Min 3X/week        Progress Toward Goals  OT Goals(current goals can now be found in the care plan section)  Progress towards OT goals: Progressing toward goals  Acute Rehab OT Goals Patient Stated Goal: improve function OT Goal Formulation: With patient/family Time For Goal Achievement: 06/12/24 Potential to Achieve Goals: Fair ADL Goals Pt Will Perform Grooming: with modified independence;standing Pt Will Perform Lower Body Bathing: with modified independence Pt Will Perform Lower Body Dressing: with modified independence Pt Will Transfer to Toilet: with modified independence;ambulating Pt Will  Perform Toileting - Clothing Manipulation and hygiene: with modified independence Pt/caregiver will Perform Home Exercise Program: Increased strength;Right Upper extremity;Left upper extremity;Independently Additional ADL Goal #1: Pt will demonstrate improved visual motor skills by tracking an object with fluid eye movement and no delay.  Plan      Co-evaluation    PT/OT/SLP Co-Evaluation/Treatment: Yes Reason for Co-Treatment: To address functional/ADL transfers   OT goals addressed during session: ADL's and self-care                          End of Session Equipment Utilized During Treatment: Rolling walker (2 wheels);Gait belt  OT Visit Diagnosis: Unsteadiness on feet (R26.81);Other abnormalities of gait and mobility (R26.89);Muscle weakness (generalized) (M62.81)   Activity Tolerance Patient tolerated treatment well   Patient Left in bed;with call bell/phone within reach;with bed alarm set;with family/visitor present   Nurse Communication          Time: 8543-8494 OT Time Calculation (min): 9 min  Charges: OT General Charges $OT Visit: 1 Visit (no charge taken due to co-treat with PT.)  JAYSON PERSON OT, MOT   Jayson Person 05/30/2024, 4:00 PM

## 2024-05-30 NOTE — Plan of Care (Signed)

## 2024-05-30 NOTE — Plan of Care (Signed)

## 2024-05-30 NOTE — Progress Notes (Signed)
 SPIRITUAL CARE AND COUNSELING CONSULT NOTE   VISIT SUMMARY   Reason for Visit: Chaplain responding to referral from clinical team informing me that one [facility] our [facility]'s was hospitalized and wife of Pt was requesting a visit from chaplain.  Description of Visit: Upon entering the room I found Dean Randolph in the bed and his wife was there as a support person, as well as his daughter.   [name] states he is feeling better today- talks of his readiness to be transferred to [facility] for PT.  He is anxious to get stronger and be home for Christmas.  This is a big motivation for him.   Lindon seems to be in high spirits.  I suspect his wife is feeling the anxiety of his condition and may need more support in the future.   Plan of Care: As Pt will be transferred to [facility] in the next 24-48 hours, I will make referral to a chaplain there to follow up with Pt and wife.  SPIRITUAL ENCOUNTER                                                                                                                                                                      Type of Visit: Initial Care provided to:: Patient, Family Conversation partners present during encounter: Physician Referral source: Family Reason for visit: Routine spiritual support OnCall Visit: No   SPIRITUAL FRAMEWORK  Presenting Themes: Caregiving needs, Goals in life/care, Significant life change Values/beliefs: Not reviewed at this time Community/Connection: Family Strengths: Not reviewed at this time Needs/Challenges/Barriers: Not reviewed at this time Patient Stress Factors: Health changes, Major life changes Family Stress Factors: Health changes, Major life changes   GOALS   Self/Personal Goals: Pt states I want to be home for Christmas Clinical Care Goals: Create Space for spiritual exploration and religious conversation   INTERVENTIONS   Spiritual Care Interventions Made: Established relationship of care and  support, Compassionate presence, Narrative/life review    INTERVENTION OUTCOMES   Outcomes: Connection to spiritual care, Awareness around self/spiritual resourses  SPIRITUAL CARE PLAN   Spiritual Care Issues Still Outstanding: Referring to oncoming chaplain for further support (Going to refer to chaplains at [facility])    Maude Roll, MDiv Chaplain, [email or web address] [email or web address] [phone] 05/30/2024 3:55 PM

## 2024-05-30 NOTE — Progress Notes (Signed)
 PROGRESS NOTE   [name]  FMW:[id number] DOB: 06/01/1941 DOA: 05/27/2024 PCP: Davonna Siad, MD   Chief Complaint  Patient presents with   Altered Mental Status   Level of care: Telemetry  Brief Admission History:  As per H&P written by Dr. Barbra on 05/27/2024  Dean Randolph is a 83 y.o. 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  Patient recently treated for UTI 6 days ago and presents with worsening confusion.  His confusion was worse over the last 24 hours.  He has also had decreased appetite and oral intake.  No fevers, chills, nausea, vomiting.   MRI shows new strokes.  History obtained by daughter  Assessment and Plan:  Acute CVA Acute metabolic encephalopathy -- MRI positive for small acute to early subacute bilateral occipital infarcts -- Case has been discussed with neurology service with recommendation for 90 days of dual antiplatelet therapy and following that just daily aspirin for secondary prevention.. - Continue controlling blood pressure and further risk factor modifications. -- Appreciate assistance and recommendation by neurology service. - 2D echo demonstrating preserved ejection fraction and no significant valvular disorder - PT/OT recommending CIR. - Continue Lipitor (dose adjusted to 20 mg daily)..  Essential hypertension  -Continue current antihypertensive agents and follow vital signs.  urothelial carcinoma  -- metastasis to bone and lymph nodes -- he has been receiving enfortumab vedotin  and pembrolizumab  -- he is started on palliative radiation for pain control. - Continue outpatient follow-up with oncology service.  DVT prophylaxis: enoxaparin   Code Status: DNR  DNI  Family Communication: daughter at bedside updated  Disposition: TBD; most likely inpatient rehab for acute rehabilitation and tune up prior to discharge home with  home health services.  Consultants:  Neurology  Procedures:  See below for x-ray reports.  Antimicrobials: None.   Subjective: Afebrile, no chest pain, no nausea, no vomiting.  In no acute distress.  Reports feeling generally weak.  Objective: Vitals:   05/30/24 1224 05/30/24 1224 05/30/24 1332 05/30/24 1616  BP: 126/71 126/71 122/64 (!) 130/93  Pulse: (!) 103 (!) 102 (!) 105 (!) 107  Resp: 18 18 20 18   Temp: 98.2 F (36.8 C) 98.2 F (36.8 C) (!) 97.5 F (36.4 C)   TempSrc: Oral Oral Oral   SpO2: 98% 99% 98% 96%  Weight:      Height:        Intake/Output Summary (Last 24 hours) at 05/30/2024 1815 Last data filed at 05/30/2024 1300 Gross per 24 hour  Intake 240 ml  Output 400 ml  Net -160 ml   Filed Weights   05/27/24 1222  Weight: 65.3 kg   Examination: General exam: Alert, awake, following commands appropriately; generally weak in no acute distress. Respiratory system: Good saturation on room air; no using accessory muscles. Cardiovascular system: Rate controlled, no rubs, no gallops, no JVD. Gastrointestinal system: Abdomen is nondistended, soft and nontender. No organomegaly or masses felt. Normal bowel sounds heard. Central nervous system: Moving 4 limbs spontaneously.  No focal neurological deficits. Extremities: No cyanosis or clubbing. Skin: No petechiae. Psychiatry: Judgement and insight appear normal.  Flat affect appreciated on exam.  Data Reviewed: I have personally reviewed following labs and imaging studies  CBC: Recent Labs  Lab 05/27/24 1326 05/29/24 0701  WBC 9.5 8.4  NEUTROABS 7.6  --   HGB 9.4* 9.2*  HCT 27.4* 28.7*  MCV 100.4* 97.3  PLT 317 308  Basic Metabolic Panel: Recent Labs  Lab 05/27/24 1326 05/29/24 0355  NA 136 137  K 4.3 3.5  CL 99 103  CO2 25 23  GLUCOSE 102* 86  BUN 23 18  CREATININE 0.86 0.76  CALCIUM 9.6 8.7*    CBG: No results for input(s): GLUCAP in the last 168 hours.  Recent Results (from the past  240 hours)  Urine Culture     Status: Abnormal (Preliminary result)   Collection Time: 05/27/24 12:36 PM   Specimen: Urine, Random  Result Value Ref Range Status   Specimen Description   Final    URINE, RANDOM Performed at [facility], [address] [place], [place] [address]    Special Requests   Final    NONE Reflexed from [id number] Performed at [facility], [address], [place], [place] [address]    Culture (A)  Final    20,000 COLONIES/mL STENOTROPHOMONAS MALTOPHILIA Sent to [facility] for further susceptibility testing. Performed at [facility], [address] [place], [place] [address]    Report Status PENDING  Incomplete  MRSA Next Gen by PCR, Nasal     Status: None   Collection Time: 05/27/24  6:15 PM   Specimen: Nasal Mucosa; Nasal Swab  Result Value Ref Range Status   MRSA by PCR Next Gen NOT DETECTED NOT DETECTED Final    Comment: (NOTE) The GeneXpert MRSA Assay (FDA approved for NASAL specimens only), is one component of a comprehensive MRSA colonization surveillance program. It is not intended to diagnose MRSA infection nor to guide or monitor treatment for MRSA infections. Test performance is not FDA approved in patients less than 82 years old. Performed at [facility], [address] [place], [place] [address]      Radiology Studies: No results found.   Scheduled Meds:  artificial tears  1 drop Both Eyes QID   aspirin EC  81 mg Oral Daily   atorvastatin  20 mg Oral QPM   Chlorhexidine  Gluconate Cloth  6 each Topical Q0600   clopidogrel  75 mg Oral Daily   enoxaparin  (LOVENOX ) injection  40 mg Subcutaneous Q24H   feeding supplement  237 mL Oral BID BM   tamsulosin  0.4 mg Oral QPC supper   Continuous Infusions:    LOS: 3 days   Time spent: 50 mins  Eric Nunnery, MD How to contact the [facility] Attending or Consulting provider 7A - 7P or covering provider during after hours 7P -7A, for this patient?  Check the care team in [facility] and look for a)  attending/consulting [facility] provider listed and b) the [facility] team listed Log into [email or web address] to find provider on call.  Locate the [facility] provider you are looking for under [facility] and page to a number that you can be directly reached. If you still have difficulty reaching the provider, please page the [facility] (Director on Call) for the Hospitalists listed on amion for assistance.  05/30/2024, 6:15 PM

## 2024-05-30 NOTE — Progress Notes (Signed)
 Physical Medicine & Rehabilitation Consult Service  Pt discussed with rehab admissions coordinator. Chart has been reviewed. This is a 83 year old male with bladder cancer and mets to the thoracic and lumbar spine who presented with encephalopathy and was found to have bilateral occipital infarcts.  MRI without concerns of metastatic disease to the brain.  CTA demonstrated bilateral PCA stenosis.  Patient on aspirin and Plavix for 90 days per neurology.  Permissive hypertension recommended by neurology to maintain perfusion. receiving enfortumab vedotin  and pembrolizumab ,  palliative radiation ongoing for pain control.   Home: Home Living Family/patient expects to be discharged to:: Private residence Living Arrangements: Spouse/significant other, Children Available Help at Discharge: Family, Available 24 hours/day Type of Home: House Home Access: Level entry Home Layout: Laundry or work area in basement [employer] Shower/Tub: Heritage Manager Toilet: Handicapped height Bathroom Accessibility: No Home Equipment: Agricultural Consultant (2 wheels), BSC/3in1, Information systems manager, Wheelchair - manual, Medical Laboratory Scientific Officer - single point, Grab bars - toilet, Grab bars - tub/shower Additional Comments: Daugher lives near the home.  Functional History: Prior Function Prior Level of Function : Independent/Modified Independent Mobility Comments: Pt reports as community ambulator without AD. The past 2 weeks the pt has needed physical assist to stand. ADLs Comments: Reports independence with ADLs/iADLs Functional Status:  Mobility: Bed Mobility Overal bed mobility: Needs Assistance Bed Mobility: Supine to Sit Supine to sit: Min assist, Mod assist General bed mobility comments: labored; trunk control assist Transfers Overall transfer level: Needs assistance Equipment used: Rolling walker (2 wheels) Transfers: Sit to/from Stand, Bed to chair/wheelchair/BSC Sit to Stand: Mod assist, Max assist Bed to/from  chair/wheelchair/BSC transfer type:: Step pivot Step pivot transfers: Mod assist General transfer comment: EOB to chair without AD Ambulation/Gait Ambulation/Gait assistance: Min assist, Contact guard assist Gait Distance (Feet): 20 Feet Assistive device: Rolling walker (2 wheels) Gait Pattern/deviations: Step-through pattern, Decreased step length - right, Decreased step length - left, Decreased stance time - right, Decreased stance time - left, Decreased stride length, Narrow base of support General Gait Details: Pt. was able ambulate into hallway w/ RW towards the end, pt was able to ambulate to [facility]    ADL: ADL Overall ADL's : Needs assistance/impaired Eating/Feeding: Independent, Sitting Grooming: Set up, Sitting Upper Body Bathing: Set up, Sitting Lower Body Bathing: Set up, Contact guard assist, Sitting/lateral leans Upper Body Dressing : Set up, Sitting Lower Body Dressing: Set up, Contact guard assist, Sitting/lateral leans Lower Body Dressing Details (indicate cue type and reason): Able to manage socks seated in chair without physical assist. Toilet Transfer: Moderate assistance, Stand-pivot, Rolling walker (2 wheels) Toilet Transfer Details (indicate cue type and reason): EOB to chair with RW and ambulation in the room with RW Toileting- Clothing Manipulation and Hygiene: Maximal assistance, Sit to/from stand Toileting - Clothing Manipulation Details (indicate cue type and reason): Assisted for peri-care while standing with assist from physical therapy. Tub/ Shower Transfer: Moderate assistance, Stand-pivot, Tub bench Functional mobility during ADLs: Minimal assistance, Rolling walker (2 wheels) General ADL Comments: Able to ambulate ~40 feet just outside the room and back to chair with RW.  Cognition: Cognition Orientation Level: Oriented to person, Oriented to place, Disoriented to situation, Disoriented to time Cognition Arousal: Alert Behavior During Therapy: WFL for  tasks assessed/performed   Assessment: 83 year old male with bilateral occipital infarcts Metastatic bladder cancer to thoracic and lumbar spine   Plan:  This patient would benefit from acute inpatient rehab to address functional mobility and self-care. Additionally, the patient requires daily MD oversight of  the active medical issues noted above. Projected goals would be modified independent with an ELOS of 7 days.  Dispo and social supports are appropriate.    Rehab Admissions Coordinator to follow up.    Arthea IVAR Gunther, MD, [facility] Physical Medicine & Rehabilitation Medical Director Rehabilitation Services 05/30/2024

## 2024-05-30 NOTE — Progress Notes (Signed)
  Inpatient Rehabilitation Admissions Coordinator   I spoke with patient's daughter, Channing, by conference call at patient's bedside with patient and his wife for rehab assessment. We discussed goals and expectations of a possible CIR admit.  Patient has received his chemo at [facility] oncology 2 weeks on and 1 week off. Radiation was to be once and is arranged with [facility]. CIR can not provide chemo treatment while admitted. Dr Babs has consulted and recommends 7 day stay at [facility]. They are in agreement and prefer [facility] for rehab. Family can provide expected caregiver support that is recommended. I will begin insurance Auth with [employer] for possible CIR admit pending approval. Please call me with any questions.   Heron Leavell, RN, MSN Rehab Admissions Coordinator [phone]

## 2024-05-30 NOTE — Progress Notes (Signed)
Physical Therapy Treatment Patient Details Name: Dean Randolph MRN: [id number] DOB: Nov 25, 1940 Today's Date: 05/30/2024   History of Present Illness Dean Randolph is a 83 y.o. 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  Patient recently treated for UTI 6 days ago and presents with worsening confusion.  His confusion was worse over the last 24 hours.  He has also had decreased appetite and oral intake.  No fevers, chills, nausea, vomiting.     MRI shows new strokes.  History obtained by daughter    PT Comments  Patient agreeable to OT/PT co-treatment. Patient was received supine in bed and required min assist to sit EOB. To stand, pt required CGA for steadying pt is able to ambulate in room and halls with RW and min assist at times due to drifting/leaning to L at times. Once returned to room, pt washes hands at sink, and loses balance in anterior direction requiring min assist for safety. Pt returns to bed at end of session, call button in reach, all needs met, and family present. Patient will benefit from continued skilled physical therapy acutely and in recommended venue in order to address current deficits and improve overall function.       If plan is discharge home, recommend the following: Two people to help with walking and/or transfers;Assistance with feeding;Help with stairs or ramp for entrance;A lot of help with bathing/dressing/bathroom   Can travel by private vehicle        Equipment Recommendations  None recommended by PT    Recommendations for Other Services       Precautions / Restrictions Precautions Precautions: Fall Recall of Precautions/Restrictions: Intact Restrictions Weight Bearing Restrictions Per Provider Order: No     Mobility  Bed Mobility Overal bed mobility: Needs Assistance Bed Mobility: Supine to Sit, Sit to Supine     Supine to sit: Min  assist Sit to supine: Contact guard assist   General bed mobility comments: pt able to initiate transfer with LE to EOB but requires Assist via pull to sit for trunk elevation    Transfers Overall transfer level: Needs assistance Equipment used: Rolling walker (2 wheels) Transfers: Sit to/from Stand Sit to Stand: Contact guard assist           General transfer comment: Sit to stand from EOB with CGA for safety and RW, mild unsteadiness once on feet    Ambulation/Gait Ambulation/Gait assistance: Min assist, Contact guard assist Gait Distance (Feet): 40 Feet Assistive device: Rolling walker (2 wheels) Gait Pattern/deviations: Step-through pattern, Decreased step length - right, Decreased step length - left, Decreased stance time - right, Decreased stance time - left, Decreased stride length, Narrow base of support Gait velocity: Dec     General Gait Details: Pt able to ambulate in room and hallways with RW and CGA/min A, pt demo some drifting throughout, mostly to L requiring min assist for steadying   Stairs             Wheelchair Mobility     Tilt Bed    Modified Rankin (Stroke Patients Only)       Balance Overall balance assessment: Needs assistance Sitting-balance support: No upper extremity supported, Feet supported Sitting balance-Leahy Scale: Fair Sitting balance - Comments: fair to good at EOB   Standing balance support: Bilateral upper extremity supported, During functional activity, No upper extremity supported Standing balance-Leahy Scale: Fair Standing balance comment: fair with RW  Communication Communication Communication: Impaired Factors Affecting Communication: Reduced clarity of speech  Cognition Arousal: Alert Behavior During Therapy: WFL for tasks assessed/performed   PT - Cognitive impairments: No apparent impairments                         Following commands: Intact      Cueing  Cueing Techniques: Verbal cues  Exercises      General Comments        Pertinent Vitals/Pain Pain Assessment Pain Assessment: 0-10 Pain Score: 5  Pain Location: R flank with movement Pain Descriptors / Indicators: Discomfort Pain Intervention(s): Limited activity within patient's tolerance, Repositioned    Home Living                          Prior Function            PT Goals (current goals can now be found in the care plan section) Acute Rehab PT Goals Patient Stated Goal: wants to return home PT Goal Formulation: With patient/family Time For Goal Achievement: 06/27/2024 Potential to Achieve Goals: Good Progress towards PT goals: Progressing toward goals    Frequency    Min 3X/week      PT Plan      Co-evaluation PT/OT/SLP Co-Evaluation/Treatment: Yes Reason for Co-Treatment: To address functional/ADL transfers PT goals addressed during session: Mobility/safety with mobility OT goals addressed during session: ADL's and self-care      AM-PAC PT 6 Clicks Mobility   Outcome Measure  Help needed turning from your back to your side while in a flat bed without using bedrails?: A Little Help needed moving from lying on your back to sitting on the side of a flat bed without using bedrails?: A Little Help needed moving to and from a bed to a chair (including a wheelchair)?: A Little Help needed standing up from a chair using your arms (e.g., wheelchair or bedside chair)?: A Lot Help needed to walk in hospital room?: A Little Help needed climbing 3-5 steps with a railing? : Total 6 Click Score: 15    End of Session Equipment Utilized During Treatment: Gait belt Activity Tolerance: Patient tolerated treatment well Patient left: with family/visitor present;with call bell/phone within reach;in bed Nurse Communication: Mobility status PT Visit Diagnosis: Other abnormalities of gait and mobility (R26.89);Muscle weakness (generalized) (M62.81);History of  falling (Z91.81)     Time: 8545-8494 PT Time Calculation (min) (ACUTE ONLY): 11 min  Charges:    $Therapeutic Activity: 8-22 mins PT General Charges $$ ACUTE PT VISIT: 1 Visit                     4:24 PM, 05/30/24 Ormand Senn Powell-Butler, PT, DPT [place] with [facility]

## 2024-05-31 DIAGNOSIS — G9340 Encephalopathy, unspecified: Secondary | ICD-10-CM | POA: Diagnosis not present

## 2024-05-31 DIAGNOSIS — I639 Cerebral infarction, unspecified: Secondary | ICD-10-CM | POA: Diagnosis not present

## 2024-05-31 DIAGNOSIS — I10 Essential (primary) hypertension: Secondary | ICD-10-CM | POA: Diagnosis not present

## 2024-05-31 DIAGNOSIS — C689 Malignant neoplasm of urinary organ, unspecified: Secondary | ICD-10-CM | POA: Diagnosis not present

## 2024-05-31 MED ORDER — LIDOCAINE VISCOUS HCL 2 % MT SOLN: 15.0000 mL | Freq: Once | OROMUCOSAL | Status: DC

## 2024-05-31 MED ORDER — ALUM & MAG HYDROXIDE-SIMETH 200-200-20 MG/5ML PO SUSP
30.0000 mL | Freq: Once | ORAL | Status: AC
  Administered 2024-05-31: 30 mL via ORAL
  Filled 2024-05-31: qty 30

## 2024-05-31 MED ORDER — LORATADINE 10 MG PO TABS
10.0000 mg | ORAL_TABLET | Freq: Every day | ORAL | Status: DC | PRN
  Administered 2024-05-31: 10 mg via ORAL
  Filled 2024-05-31: qty 1

## 2024-05-31 MED ORDER — LORATADINE 10 MG PO TABS: 10.0000 mg | ORAL_TABLET | Freq: Every day | ORAL | Status: DC

## 2024-05-31 MED ORDER — ONDANSETRON HCL 4 MG/2ML IJ SOLN
4.0000 mg | Freq: Four times a day (QID) | INTRAMUSCULAR | Status: DC | PRN
  Administered 2024-05-31 – 2024-06-01 (×4): 4 mg via INTRAVENOUS
  Filled 2024-05-31 (×4): qty 2

## 2024-05-31 NOTE — PMR Pre-admission (Shared)
 PMR Admission Coordinator Pre-Admission Assessment  Patient: Dean Randolph is an 83 y.o., male MRN: [id number] DOB: October 14, 1940 Height: 5' 9 (175.3 cm) Weight: 65.3 kg  Insurance Information HMO:     PPO: yes     PCP:      IPA:      80/20:      OTHER:  PRIMARY: [employer] PPO      Policy#: [id number]; Medicare [id number]      Subscriber: pt CM Name: Madelin      Phone#: [phone]     Fax#: EPIC access and [phone] Pre-Cert#: *** Auth for CIR from *** with *** for admit *** with next review date ***.  Updates due to *** at fax listed above.        Employer:  Benefits:  Phone #: [phone]     Name: 12/2 Eff. Date: 06/30/23     Deduct: none      Out of Pocket Max: $3400 (met)      Life Max: none CIR: $325 co pay per day days 1 until 6      SNF: no co pay per day days 1 until 20; $214 co pay per day days 21 until 100 Outpatient: $15 per visit     Co-Pay: visits per medical neccesity Home Health: 100%      Co-Pay:  DME: 75%     Co-Pay:  Providers: in network  SECONDARY: none      Policy#:      Phone#:   Artist:       Phone#:   The Data Processing Manager" for patients in Inpatient Rehabilitation Facilities with attached "Privacy Act Statement-Health Care Records" was provided and verbally reviewed with: Patient and Family  Emergency Contact Information Contact Information     Name Relation Home Work Mobile   [employer] Spouse [phone]  [phone]   Ledford,Cheryl Daughter [phone]     Clair Anes Relative   [phone]      Other Contacts     Name Relation Home Work Mobile   [place] Son   [phone]       Current Medical History  Patient Admitting Diagnosis: CVA  History of Present Illness: 83 yo male with history of HTN, recent diagnosis of metastatic of stage IV urothelial cancer . Undergoing palliative chemotherapy. Recently treated for UTI presented to [facility] on 05/27/24 with increased confusion, poor  intake.   MRI revealed small acute to early subacute bilateral occipital infarcts. CTA demonstrated bilateral PCA stenosis. Neurology consulted. Recommend 90 days of dual antiplatelet therapy and then follow with daily ASA for secondary prevention. Continue Lipitor. Continue antihypertensive meds, allow permissive HTN.   Urothelial cancer and has been receiving Outpatient Chemotherapy of enfortuman vedotin and pembrolizumab  at [facility] center. Has been simulated at [facility] for palliative radiation for pain control Will follow up as s an outpatient. DNR/DNI . On acute. Lovenox  for DVT prophylaxis.   Complete NIHSS TOTAL: 1  Patient's medical record from [facility] has been reviewed by the rehabilitation admission coordinator and physician.  Past Medical History  Past Medical History:  Diagnosis Date   Bladder cancer (HCC)    Hypertension    Has the patient had major surgery during 100 days prior to admission? No  Family History   family history includes Stroke in his mother.  Current Medications  Current Facility-Administered Medications:    acetaminophen  (TYLENOL ) tablet 650 mg, 650 mg, Oral, Q4H PRN, 650 mg at 05/31/24 0816 **OR** acetaminophen  (  TYLENOL ) 160 MG/5ML solution 650 mg, 650 mg, Per Tube, Q4H PRN **OR** acetaminophen  (TYLENOL ) suppository 650 mg, 650 mg, Rectal, Q4H PRN, Ricky Fines, MD   artificial tears ophthalmic solution 1 drop, 1 drop, Both Eyes, QID, Ricky Fines, MD, 1 drop at 05/31/24 1332   aspirin EC tablet 81 mg, 81 mg, Oral, Daily, Ricky Fines, MD, 81 mg at 05/31/24 0816   atorvastatin (LIPITOR) tablet 20 mg, 20 mg, Oral, QPM, Ricky Fines, MD, 20 mg at 05/30/24 1718   Chlorhexidine  Gluconate Cloth 2 % PADS 6 each, 6 each, Topical, Q0600, Ricky Fines, MD, 6 each at 05/31/24 0450   [COMPLETED] clopidogrel (PLAVIX) tablet 300 mg, 300 mg, Oral, Once, 300 mg at 05/28/24 1409 **FOLLOWED BY** clopidogrel (PLAVIX) tablet 75 mg, 75 mg, Oral,  Daily, Ricky Fines, MD, 75 mg at 05/31/24 9183   enoxaparin  (LOVENOX ) injection 40 mg, 40 mg, Subcutaneous, Q24H, Ricky Fines, MD, 40 mg at 05/30/24 1739   feeding supplement (ENSURE PLUS HIGH PROTEIN) liquid 237 mL, 237 mL, Oral, BID BM, Ricky Fines, MD, 237 mL at 05/31/24 1332   guaiFENesin-dextromethorphan (ROBITUSSIN DM) 100-10 MG/5ML syrup 5 mL, 5 mL, Oral, Q4H PRN, Ricky Fines, MD, 5 mL at 05/30/24 1718   ondansetron  (ZOFRAN ) injection 4 mg, 4 mg, Intravenous, Q6H PRN, Adefeso, Oladapo, DO, 4 mg at 05/31/24 0451   senna-docusate (Senokot-S) tablet 1 tablet, 1 tablet, Oral, QHS PRN, Ricky Fines, MD   tamsulosin (FLOMAX) capsule 0.4 mg, 0.4 mg, Oral, QPC supper, Ricky Fines, MD, 0.4 mg at 05/30/24 1718  Patients Current Diet:  Diet Order             Diet regular Room service appropriate? Yes; Fluid consistency: Thin  Diet effective now                  Precautions / Restrictions Precautions Precautions: Fall Restrictions Weight Bearing Restrictions Per Provider Order: No   Has the patient had 2 or more falls or a fall with injury in the past year? Yes  Prior Activity Level Community (5-7x/wk): was Independent without AD until gradual decline in past month  Prior Functional Level Self Care: Did the patient need help bathing, dressing, using the toilet or eating? Independent  Indoor Mobility: Did the patient need assistance with walking from room to room (with or without device)? Independent  Stairs: Did the patient need assistance with internal or external stairs (with or without device)? Independent  Functional Cognition: Did the patient need help planning regular tasks such as shopping or remembering to take medications? Independent  Patient Information Are you of Hispanic, Latino/a,or Spanish origin?: A. No, not of Hispanic, Latino/a, or Spanish origin What is your race?: A. White Do you need or want an interpreter to communicate with a doctor or  health care staff?: 0. No  Patient's Response To:  Health Literacy and Transportation Is the patient able to respond to health literacy and transportation needs?: Yes Health Literacy - How often do you need to have someone help you when you read instructions, pamphlets, or other written material from your doctor or pharmacy?: Sometimes In the past 12 months, has lack of transportation kept you from medical appointments or from getting medications?: No In the past 12 months, has lack of transportation kept you from meetings, work, or from getting things needed for daily living?: No  Home Assistive Devices / Equipment Home Equipment: Agricultural Consultant (2 wheels), BSC/3in1, Information systems manager, Wheelchair - manual, [place] - single point, Grab bars -  toilet, Grab bars - tub/shower  Prior Device Use: Indicate devices/aids used by the patient prior to current illness, exacerbation or injury? None of the above  Current Functional Level Cognition  Orientation Level: Oriented X4    Extremity Assessment (includes Sensation/Coordination)  Upper Extremity Assessment: Generalized weakness, RUE deficits/detail RUE Deficits / Details: 3+/5 MMT but limited by R flank pain. Difficult to make an accurate assessment due to R flank pain confounding variable. MIld gross motor difficult for finger to nose test. RUE Sensation: WNL RUE Coordination: decreased gross motor  Lower Extremity Assessment: Defer to PT evaluation RLE Deficits / Details: Hip Flexion 4/5, Knee flexion/extension 4+/5, ABD/ADD 4+/5, DF/PF 5/5 RLE Sensation: decreased light touch RLE Coordination: WNL LLE Deficits / Details: Hip Flexion 4-/5, Knee flexion/extension 4+/5, ABD/ADD 4+/5, DF/PF 5/5 LLE Sensation: decreased light touch LLE Coordination: WNL    ADLs  Overall ADL's : Needs assistance/impaired Eating/Feeding: Independent, Sitting Grooming: Wash/dry hands, Standing, Minimal assistance Grooming Details (indicate cue type and reason): Min A  due to balance issues whiel standing at the sink with hands off the RW. Upper Body Bathing: Set up, Sitting Lower Body Bathing: Set up, Contact guard assist, Sitting/lateral leans Upper Body Dressing : Set up, Sitting Lower Body Dressing: Set up, Contact guard assist, Sitting/lateral leans Lower Body Dressing Details (indicate cue type and reason): Able to manage socks seated in chair without physical assist. Toilet Transfer: Moderate assistance, Stand-pivot, Rolling walker (2 wheels) Toilet Transfer Details (indicate cue type and reason): EOB to chair with RW and ambulation in the room with RW Toileting- Clothing Manipulation and Hygiene: Maximal assistance, Sit to/from stand Toileting - Clothing Manipulation Details (indicate cue type and reason): Assisted for peri-care while standing with assist from physical therapy. Tub/ Shower Transfer: Moderate assistance, Stand-pivot, Tub bench Functional mobility during ADLs: Minimal assistance, Rolling walker (2 wheels) General ADL Comments: Pt ambualted in the room and hall with min A using RW.    Mobility  Overal bed mobility: Needs Assistance Bed Mobility: Supine to Sit, Sit to Supine Supine to sit: Min assist Sit to supine: Contact guard assist General bed mobility comments: pt able to initiate transfer with LE to EOB but requires Assist via pull to sit for trunk elevation    Transfers  Overall transfer level: Needs assistance Equipment used: Rolling walker (2 wheels) Transfers: Sit to/from Stand Sit to Stand: Contact guard assist Bed to/from chair/wheelchair/BSC transfer type:: Step pivot Step pivot transfers: Mod assist General transfer comment: Sit to stand from EOB with CGA for safety and RW, mild unsteadiness once on feet    Ambulation / Gait / Stairs / Wheelchair Mobility  Ambulation/Gait Ambulation/Gait assistance: Min assist, Contact guard assist Gait Distance (Feet): 40 Feet Assistive device: Rolling walker (2 wheels) Gait  Pattern/deviations: Step-through pattern, Decreased step length - right, Decreased step length - left, Decreased stance time - right, Decreased stance time - left, Decreased stride length, Narrow base of support General Gait Details: Pt able to ambulate in room and hallways with RW and CGA/min A, pt demo some drifting throughout, mostly to L requiring min assist for steadying Gait velocity: Dec    Posture / Balance Dynamic Sitting Balance Sitting balance - Comments: fair to good at EOB Balance Overall balance assessment: Needs assistance Sitting-balance support: No upper extremity supported, Feet supported Sitting balance-Leahy Scale: Fair Sitting balance - Comments: fair to good at EOB Standing balance support: Bilateral upper extremity supported, During functional activity, No upper extremity supported Standing balance-Leahy Scale: Fair Standing  balance comment: fair with RW    Special considerations/life events  OP IV chemo at [facility] cancer center Simulated for palliative one dose radiation at [facility] 11/29 urethral cath placed  DNR/DNI on acute   Previous Home Environment  Living Arrangements: Spouse/significant other, Children  Lives With: Spouse Available Help at Discharge:  (spouse, daughter and family) Type of Home: House Home Layout: Laundry or work area in basement Home Access: Level entry [employer] Shower/Tub: Health Visitor: Handicapped height Bathroom Accessibility: Yes How Accessible: Accessible via walker Home Care Services: Yes Type of Home Care Services: Homehealth aide, Home PT, Home OT, Home RN Home Care Agency (if known): [facility] Additional Comments: Daugher lives near the home.  Discharge Living Setting Plans for Discharge Living Setting: Patient's home, House, Lives with (comment) (wife) Type of Home at Discharge: House Discharge Home Layout: Laundry or work area in basement Discharge Home Access: Level entry Discharge Bathroom  Shower/Tub: Walk-in shower Discharge Bathroom Toilet: Handicapped height Discharge Bathroom Accessibility: Yes How Accessible: Accessible via walker Does the patient have any problems obtaining your medications?: No  Social/Family/Support Systems Patient Roles: Spouse, Parent Contact Information: daughter, Channing and her Mom is always with her ( pt's spouse) Anticipated Caregiver: wife, daughter and family Anticipated Caregiver's Contact Information: see contacts Ability/Limitations of Caregiver: family can provide 24/7 supervision to light min assist Caregiver Availability: 24/7 Discharge Plan Discussed with Primary Caregiver: Yes Is Caregiver In Agreement with Plan?: Yes Does Caregiver/Family have Issues with Lodging/Transportation while Pt is in Rehab?: No  Goals Patient/Family Goal for Rehab: supervision PT, supervision to min OT Expected length of stay: ELOS 7 to 10 days Pt/Family Agrees to Admission and willing to participate: Yes Program Orientation Provided & Reviewed with Pt/Caregiver Including Roles  & Responsibilities: Yes  Decrease burden of Care through IP rehab admission: n/a  Possible need for SNF placement upon discharge: not anticipated  Patient Condition: I have reviewed medical records from [facility], spoken with  patient, spouse, and daughter. I discussed via phone for inpatient rehabilitation assessment.  Patient will benefit from ongoing PT and OT, can actively participate in 3 hours of therapy a day 5 days of the week, and can make measurable gains during the admission.  Patient will also benefit from the coordinated team approach during an Inpatient Acute Rehabilitation admission.  The patient will receive intensive therapy as well as Rehabilitation physician, nursing, social worker, and care management interventions.  Due to bladder management, bowel management, safety, skin/wound care, disease management, medication administration, pain management, and patient education  the patient requires 24 hour a day rehabilitation nursing.  The patient is currently *** with mobility and basic ADLs.  Discharge setting and therapy post discharge at home with home health is anticipated.  Patient has agreed to participate in the Acute Inpatient Rehabilitation Program and will admit today.  Preadmission Screen Completed By:  Alison Heron Lot, RN MSN 05/31/2024 1:54 PM ______________________________________________________________________   Discussed status with Dr. PIERRETTE on *** at *** and received approval for admission today.  Admission Coordinator:  Alison Heron Lot, RN MSN time PIERRETTEPattricia ***   Assessment/Plan: Diagnosis: *** Does the need for close, 24 hr/day Medical supervision in concert with the patient's rehab needs make it unreasonable for this patient to be served in a less intensive setting? {yes_no_potentially:3041433} Co-Morbidities requiring supervision/potential complications: *** Due to {due un:6958565}, does the patient require 24 hr/day rehab nursing? {yes_no_potentially:3041433} Does the patient require coordinated care of a physician, rehab nurse, PT, OT, and SLP  to address physical and functional deficits in the context of the above medical diagnosis(es)? {yes_no_potentially:3041433} Addressing deficits in the following areas: {deficits:3041436} Can the patient actively participate in an intensive therapy program of at least 3 hrs of therapy 5 days a week? {yes_no_potentially:3041433} The potential for patient to make measurable gains while on inpatient rehab is {potential:3041437} Anticipated functional outcomes upon discharge from inpatient rehab: {functional outcomes:304600100} PT, {functional outcomes:304600100} OT, {functional outcomes:304600100} SLP Estimated rehab length of stay to reach the above functional goals is: *** Anticipated discharge destination: {anticipated dc setting:21604} 10. Overall Rehab/Functional Prognosis:  {potential:3041437}   MD Signature: ***

## 2024-05-31 NOTE — Progress Notes (Signed)
 Mobility Specialist Progress Note:    05/31/24 1012  Mobility  Activity Ambulated with assistance  Level of Assistance Contact guard assist, steadying assist  Assistive Device Other (Comment) (1HHA)  Distance Ambulated (ft) 10 ft  Range of Motion/Exercises Active;All extremities  Activity Response Tolerated well  Mobility Referral Yes  Mobility visit 1 Mobility  Mobility Specialist Start Time (ACUTE ONLY) 1012  Mobility Specialist Stop Time (ACUTE ONLY) 1030  Mobility Specialist Time Calculation (min) (ACUTE ONLY) 18 min   Pt received in bathroom, requesting assistance with peri care and ambulating back to bed. Required CGA to stand and ambulate with 1 hand-held assist. Family in room, alarm on. All needs met.  Kimyetta Flott Mobility Specialist Please contact via Special Educational Needs Teacher or  Rehab office at [phone]

## 2024-05-31 NOTE — Progress Notes (Signed)
   Inpatient Rehabilitation Admissions Coordinator   I await [employer] determination for a possible CIR admit at [facility] in [place].  Heron Leavell, RN, MSN Rehab Admissions Coordinator [phone] 05/31/2024 10:45 AM

## 2024-05-31 NOTE — Plan of Care (Signed)
  Problem: Health Behavior/Discharge Planning: Goal: Ability to manage health-related needs will improve Outcome: Progressing   Problem: Clinical Measurements: Goal: Ability to maintain clinical measurements within normal limits will improve Outcome: Progressing Goal: Will remain free from infection Outcome: Progressing Goal: Diagnostic test results will improve Outcome: Progressing Goal: Respiratory complications will improve Outcome: Progressing Goal: Cardiovascular complication will be avoided Outcome: Progressing   Problem: Activity: Goal: Risk for activity intolerance will decrease Outcome: Progressing   Problem: Nutrition: Goal: Adequate nutrition will be maintained Outcome: Progressing   Problem: Coping: Goal: Level of anxiety will decrease Outcome: Progressing   Problem: Elimination: Goal: Will not experience complications related to bowel motility Outcome: Progressing Goal: Will not experience complications related to urinary retention Outcome: Progressing   Problem: Safety: Goal: Ability to remain free from injury will improve Outcome: Progressing

## 2024-05-31 NOTE — Progress Notes (Signed)
 PROGRESS NOTE   Dean Randolph  FMW:[id number] DOB: 03/31/1941 DOA: 05/27/2024 PCP: Davonna Siad, MD   Chief Complaint  Patient presents with   Altered Mental Status   Level of care: Telemetry  Brief Admission History:  As per H&P written by Dr. Barbra on 05/27/2024  Dean Randolph is a 83 y.o. 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  Patient recently treated for UTI 6 days ago and presents with worsening confusion.  His confusion was worse over the last 24 hours.  He has also had decreased appetite and oral intake.  No fevers, chills, nausea, vomiting.   MRI shows new strokes.  History obtained by daughter  Assessment and Plan:  Acute CVA Acute metabolic encephalopathy -- MRI positive for small acute to early subacute bilateral occipital infarcts -- Case has been discussed with neurology service with recommendation for 90 days of dual antiplatelet therapy and following that just daily aspirin for secondary prevention.. - Continue controlling blood pressure and further risk factor modifications. -- Appreciate assistance and recommendation by neurology service. - 2D echo demonstrating preserved ejection fraction and no significant valvular disorder - PT/OT recommending CIR. awaiting insurance authorization prior to transfer to inpatient rehab. - Continue Lipitor (dose adjusted to 20 mg daily)..  Essential hypertension  -Continue current antihypertensive agents and follow vital signs.  urothelial carcinoma  -- metastasis to bone and lymph nodes -- he has been receiving enfortumab vedotin  and pembrolizumab  -- he is started on palliative radiation for pain control. - Continue outpatient follow-up with oncology service.  DVT prophylaxis: enoxaparin   Code Status: DNR  DNI  Family Communication: daughter at bedside updated  Disposition: Patient is medically ready and  at the moment waiting for insurance authorization prior to inpatient rehab admission  Consultants:  Neurology  Procedures:  See below for x-ray reports.  Antimicrobials: None.   Subjective: No fever, no chest pain, no nausea, no vomiting.  Patient reports no acute complaints.  Objective: Vitals:   05/30/24 1616 05/30/24 2023 05/31/24 0446 05/31/24 1257  BP: (!) 130/93 116/74 112/68 114/68  Pulse: (!) 107 (!) 108 (!) 107 (!) 102  Resp: 18 18 18    Temp:  97.8 F (36.6 C) 98.1 F (36.7 C) 98.2 F (36.8 C)  TempSrc:      SpO2: 96% 96% 94% 97%  Weight:      Height:        Intake/Output Summary (Last 24 hours) at 05/31/2024 1921 Last data filed at 05/31/2024 0500 Gross per 24 hour  Intake 170 ml  Output 900 ml  Net -730 ml   Filed Weights   05/27/24 1222  Weight: 65.3 kg   Examination: General exam: Alert, awake, oriented x 3; following commands appropriately.  In no major distress. Respiratory system: Good saturation on room air.  Normal respiratory effort appreciated on exam. Cardiovascular system: Rate controlled, no rubs, no gallops, no JVD. Gastrointestinal system: Abdomen is nondistended, soft and nontender. No organomegaly or masses felt. Normal bowel sounds heard. Central nervous system: Moving 4 limbs spontaneously.  Generally weak.  No focal neurological deficits. Extremities: No cyanosis or clubbing. Skin: No petechiae. Psychiatry: Judgement and insight appear normal.  Flat affect appreciated on exam.  Data Reviewed: I have personally reviewed following labs and imaging studies  CBC: Recent Labs  Lab 05/27/24 1326 05/29/24 0701  WBC 9.5 8.4  NEUTROABS 7.6  --   HGB 9.4* 9.2*  HCT 27.4* 28.7*  MCV 100.4* 97.3  PLT 317 308    Basic Metabolic Panel: Recent Labs  Lab 05/27/24 1326 05/29/24 0355  NA 136 137  K 4.3 3.5  CL 99 103  CO2 25 23  GLUCOSE 102* 86  BUN 23 18  CREATININE 0.86 0.76  CALCIUM 9.6 8.7*    CBG: No results for input(s):  GLUCAP in the last 168 hours.  Recent Results (from the past 240 hours)  Urine Culture     Status: Abnormal (Preliminary result)   Collection Time: 05/27/24 12:36 PM   Specimen: Urine, Random  Result Value Ref Range Status   Specimen Description   Final    URINE, RANDOM Performed at [facility], [address], [place], [place] [address]    Special Requests   Final    NONE Reflexed from [id number] Performed at [facility], [address], [place], [place] [address]    Culture (A)  Final    20,000 COLONIES/mL STENOTROPHOMONAS MALTOPHILIA Sent to [facility] for further susceptibility testing. Performed at [facility], [address] [place], [place] [address]    Report Status PENDING  Incomplete  MRSA Next Gen by PCR, Nasal     Status: None   Collection Time: 05/27/24  6:15 PM   Specimen: Nasal Mucosa; Nasal Swab  Result Value Ref Range Status   MRSA by PCR Next Gen NOT DETECTED NOT DETECTED Final    Comment: (NOTE) The GeneXpert MRSA Assay (FDA approved for NASAL specimens only), is one component of a comprehensive MRSA colonization surveillance program. It is not intended to diagnose MRSA infection nor to guide or monitor treatment for MRSA infections. Test performance is not FDA approved in patients less than 15 years old. Performed at [facility], [address] [place], [place] [address]      Radiology Studies: No results found.   Scheduled Meds:  artificial tears  1 drop Both Eyes QID   aspirin EC  81 mg Oral Daily   atorvastatin  20 mg Oral QPM   Chlorhexidine  Gluconate Cloth  6 each Topical Q0600   clopidogrel  75 mg Oral Daily   enoxaparin  (LOVENOX ) injection  40 mg Subcutaneous Q24H   feeding supplement  237 mL Oral BID BM   tamsulosin  0.4 mg Oral QPC supper   Continuous Infusions:    LOS: 4 days   Time spent:  Eric Nunnery, MD How to contact the [facility] Attending or Consulting provider 7A - 7P or covering provider during after hours 7P -7A,  for this patient?  Check the care team in [facility] and look for a) attending/consulting TRH provider listed and b) the TRH team listed Log into [email or web address] to find provider on call.  Locate the TRH provider you are looking for under [facility] and page to a number that you can be directly reached. If you still have difficulty reaching the provider, please page the [facility] (Director on Call) for the Hospitalists listed on amion for assistance.  05/31/2024, 7:21 PM

## 2024-06-01 LAB — BASIC METABOLIC PANEL WITH GFR
Anion gap: 10 (ref 5–15)
BUN: 24 mg/dL — ABNORMAL HIGH (ref 8–23)
CO2: 25 mmol/L (ref 22–32)
Calcium: 8.6 mg/dL — ABNORMAL LOW (ref 8.9–10.3)
Chloride: 100 mmol/L (ref 98–111)
Creatinine, Ser: 0.81 mg/dL (ref 0.61–1.24)
GFR, Estimated: >60 mL/min (ref >60)
Glucose, Bld: 101 mg/dL — ABNORMAL HIGH (ref 70–99)
Potassium: 3.4 mmol/L — ABNORMAL LOW (ref 3.5–5.1)
Sodium: 134 mmol/L — ABNORMAL LOW (ref 135–145)

## 2024-06-01 MED ORDER — CLOPIDOGREL BISULFATE 75 MG PO TABS: 75.0000 mg | ORAL_TABLET | Freq: Every day | ORAL | 2 refills | Status: DC

## 2024-06-01 MED ORDER — ATORVASTATIN CALCIUM 20 MG PO TABS: 20.0000 mg | ORAL_TABLET | Freq: Every evening | ORAL | 3 refills | Status: DC

## 2024-06-01 MED ORDER — PANTOPRAZOLE SODIUM 40 MG PO TBEC: 40.0000 mg | DELAYED_RELEASE_TABLET | Freq: Every day | ORAL | 1 refills | Status: DC

## 2024-06-01 MED ORDER — FAMOTIDINE 20 MG PO TABS: 20.0000 mg | ORAL_TABLET | Freq: Every day | ORAL | 0 refills | Status: DC

## 2024-06-01 MED ORDER — GUAIFENESIN-DM 100-10 MG/5ML PO SYRP: 5.0000 mL | ORAL_SOLUTION | ORAL | 0 refills | Status: DC | PRN

## 2024-06-01 MED ORDER — LORATADINE 10 MG PO TABS: 10.0000 mg | ORAL_TABLET | Freq: Every day | ORAL | 2 refills | Status: DC | PRN

## 2024-06-01 MED ORDER — ASPIRIN 81 MG PO TBEC: 81.0000 mg | DELAYED_RELEASE_TABLET | Freq: Every day | ORAL | 12 refills | Status: DC

## 2024-06-01 MED ORDER — ENSURE PLUS HIGH PROTEIN PO LIQD: 237.0000 mL | Freq: Two times a day (BID) | ORAL | Status: DC

## 2024-06-01 MED ORDER — PANTOPRAZOLE SODIUM 40 MG PO TBEC
40.0000 mg | DELAYED_RELEASE_TABLET | Freq: Every day | ORAL | Status: DC
  Administered 2024-06-01: 40 mg via ORAL
  Filled 2024-06-01: qty 1

## 2024-06-01 NOTE — TOC Transition Note (Addendum)
 Transition of Care [facility]) - Discharge Note   Patient Details  Name: Dean Randolph MRN: [id number] Date of Birth: 1941-04-13  Transition of Care [facility]) CM/SW Contact:  [name], RN Phone Number: 06/01/2024, 12:14 PM   Clinical Narrative:   [facility] did not approve CIR, Family wants to take patient home. He is active with [facility] for palliative and [facility] for home health. Ashely  and Darleene updated, MD placed orders, PT/OT/aide/RN/SW.     Final next level of care: Home w Home Health Services Barriers to Discharge: Barriers Resolved   Patient Goals and CMS Choice Patient states their goals for this hospitalization and ongoing recovery are:: return home CMS Medicare.gov Compare Post Acute Care list provided to:: Patient Choice offered to / list presented to : Patient [place] ownership interest in [facility].provided to:: Patient    Discharge Placement               Patient and family notified of of transfer: 06/01/24  Discharge Plan and Services Additional resources added to the After Visit Summary for                  [facility] Agency: [facility]     Social Drivers of Health (SDOH) Interventions SDOH Screenings   Food Insecurity: No Food Insecurity (05/27/2024)  Housing: Low Risk  (05/27/2024)  Transportation Needs: No Transportation Needs (05/27/2024)  Utilities: Not At Risk (05/27/2024)  Depression (PHQ2-9): Low Risk  (05/15/2024)  Social Connections: Moderately Integrated (05/27/2024)  Tobacco Use: Low Risk  (05/21/2024)     Readmission Risk Interventions    05/28/2024   12:10 PM 05/02/2024    9:55 AM  Readmission Risk Prevention Plan  Transportation Screening Complete Complete  PCP or Specialist Appt within 5-7 Days  Not Complete  Not Complete comments  PCP list added to AVS  PCP or Specialist Appt within 3-5 Days Not Complete   Home Care Screening  Complete  Medication Review (RN CM)  Complete  HRI or Home Care Consult Complete    Social Work Consult for Recovery Care Planning/Counseling Complete   Palliative Care Screening Not Complete   Medication Review [employer]) Complete

## 2024-06-01 NOTE — Care Management Important Message (Signed)
 Important Message  Patient Details  Name: Dean Randolph MRN: [id number] Date of Birth: 08/02/1940   Important Message Given:  Yes - Medicare IM     [name] [name] 06/01/2024, 1:18 PM

## 2024-06-01 NOTE — Progress Notes (Addendum)
Physical Therapy Treatment Patient Details Name: Dean Randolph MRN: [id number] DOB: [birth year] Today's Date: 06/01/2024   History of Present Illness Dean Randolph is a 83 y.o. 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  Patient recently treated for UTI 6 days ago and presents with worsening confusion.  His confusion was worse over the last 24 hours.  He has also had decreased appetite and oral intake.  No fevers, chills, nausea, vomiting.     MRI shows new strokes.  History obtained by daughter    PT Comments  Pt. Presented w/ general weakness due to dx. Pt tolerated session well, pt was at EOB prior to treatment w/ nursing staff, pt was able to maintain standing balance w/ RW w/ CGA at bedside. When pt. Returned to EOB, pt was able to perform warm-ups and maintain seating balance. During transfer from sit to stand w/ RW pt. Required CGA. During ambulation into the hallway pt was used RW and CGA w/ gait belt to assist w/ cuing. Toward the end of session pt. Was fatigued and returned to EOB w/ wife at bedside and call bell. Patient will benefit from continued skilled physical therapy in hospital and recommended venue below to increase strength, balance, endurance for safe ADLs and gait.    If plan is discharge home, recommend the following: Two people to help with walking and/or transfers;Assistance with feeding;Help with stairs or ramp for entrance;A lot of help with bathing/dressing/bathroom   Can travel by private vehicle      Yes   Equipment Recommendations  None recommended by PT    Recommendations for Other Services       Precautions / Restrictions Precautions Precautions: Fall Recall of Precautions/Restrictions: Intact Restrictions Weight Bearing Restrictions Per Provider Order: No     Mobility  Bed Mobility               General bed mobility comments: nursing  staff was in attendence prior to treatment, pt at EOB w/ RW    Transfers Overall transfer level: Needs assistance Equipment used: Rolling walker (2 wheels) Transfers: Sit to/from Stand Sit to Stand: Contact guard assist           General transfer comment: Sit to stand from EOB with CGA for safety and RW, able to maintain standing balance    Ambulation/Gait Ambulation/Gait assistance: Contact guard assist Gait Distance (Feet): 41 Feet Assistive device: Rolling walker (2 wheels) Gait Pattern/deviations: Step-through pattern, Decreased step length - right, Decreased step length - left, Decreased stance time - right, Decreased stance time - left, Decreased stride length, Narrow base of support       General Gait Details: pt. was able to ambulate into hallway w/ RW, pt presented w/ and noticable heel strike during IC, and limited PF when pushing off. Pt was able to clear foot during ambulation.   Stairs             Wheelchair Mobility     Tilt Bed    Modified Rankin (Stroke Patients Only)       Balance Overall balance assessment: Needs assistance Sitting-balance support: No upper extremity supported, Feet supported Sitting balance-Leahy Scale: Fair Sitting balance - Comments: fair to good at EOB   Standing balance support: Bilateral upper extremity supported, During functional activity, No upper extremity supported Standing balance-Leahy Scale: Good Standing balance comment: good w/ RW  Communication Communication Communication: No apparent difficulties Factors Affecting Communication: Reduced clarity of speech  Cognition Arousal: Alert Behavior During Therapy: WFL for tasks assessed/performed   PT - Cognitive impairments: No apparent impairments                         Following commands: Intact      Cueing Cueing Techniques: Verbal cues, Tactile cues  Exercises General Exercises - Lower Extremity Ankle  Circles/Pumps: AROM, Both, 5 reps, Seated Long Arc Quad: AROM, Both, Strengthening, 10 reps, Seated Hip Flexion/Marching: AROM, Strengthening, Both, 10 reps, Seated Toe Raises: AROM, Strengthening, Both, 10 reps, Seated Heel Raises: AROM, Strengthening, Both, 10 reps, Seated    General Comments        Pertinent Vitals/Pain Pain Assessment Pain Assessment: No/denies pain    Home Living                          Prior Function            PT Goals (current goals can now be found in the care plan section) Acute Rehab PT Goals Patient Stated Goal: wants to return home PT Goal Formulation: With patient Time For Goal Achievement: 06/02/2024 Potential to Achieve Goals: Good Progress towards PT goals: Progressing toward goals    Frequency    Min 3X/week      PT Plan      Co-evaluation              AM-PAC PT 6 Clicks Mobility   Outcome Measure  Help needed turning from your back to your side while in a flat bed without using bedrails?: A Little Help needed moving from lying on your back to sitting on the side of a flat bed without using bedrails?: A Little Help needed moving to and from a bed to a chair (including a wheelchair)?: A Little Help needed standing up from a chair using your arms (e.g., wheelchair or bedside chair)?: A Lot Help needed to walk in hospital room?: A Little Help needed climbing 3-5 steps with a railing? : Total 6 Click Score: 15    End of Session Equipment Utilized During Treatment: Gait belt Activity Tolerance: Patient tolerated treatment well Patient left: with family/visitor present;with call bell/phone within reach;in bed Nurse Communication: Mobility status PT Visit Diagnosis: Other abnormalities of gait and mobility (R26.89);Muscle weakness (generalized) (M62.81);History of falling (Z91.81)     Time: 1035-1101 PT Time Calculation (min) (ACUTE ONLY): 26 min  Charges:    $Gait Training: 8-22 mins $Therapeutic  Exercise: 8-22 mins PT General Charges $$ ACUTE PT VISIT: 1 Visit                    Jinelle Butchko, SPT

## 2024-06-01 NOTE — Progress Notes (Signed)
 Patient discharged home per MD orders. AVS teaching provided to patient, wife and daughter. All questions answered. No further needs identified. PIV removed. Patient discharged home in stable condition via private auto.

## 2024-06-01 NOTE — Progress Notes (Signed)
  Inpatient Rehabilitation Admissions Coordinator   Peer to peer complete with Dr Janit of [facility] and Dr Babs, CIR is denied. I spoke with daughter, Channing by phone as well as patient and his wife. They are aware and want to d/discharge home with [facility] assist and ask to make contact for those arrangements. Acute team and TOC made aware. We will sign off.  Heron Leavell, RN, MSN Rehab Admissions Coordinator [phone] 06/01/2024 11:46 AM

## 2024-06-01 NOTE — Discharge Summary (Addendum)
 Physician Discharge Summary   Patient: Dean Randolph MRN: [id number] DOB: [birth year]  Admit date:     05/27/2024  Discharge date: 06/01/24  Discharge Physician: Eric Nunnery   PCP: Davonna Siad, MD   Recommendations at discharge:  Repeat CBC to follow hemoglobin trend/stability Repeat basic metabolic panel to follow electrolytes and renal function Reassess blood pressure and adjust antihypertensive regimen as required. Outpatient referral to [facility] palliative care recommended.  Discharge Diagnoses: Principal Problem:   Encephalopathy acute Active Problems:   Essential hypertension   Urothelial carcinoma (HCC)   Stroke [facility])  Brief Admission History:  As per H&P written by Dr. Barbra on 05/27/2024  Dean Randolph is a 83 y.o. 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  Patient recently treated for UTI 6 days ago and presents with worsening confusion.  His confusion was worse over the last 24 hours.  He has also had decreased appetite and oral intake.  No fevers, chills, nausea, vomiting.   MRI shows new strokes.  History obtained by daughter  Assessment and Plan: Acute metabolic encephalopathy -- MRI positive for small acute to early subacute bilateral occipital infarcts -- Case has been discussed with neurology service with recommendation for 90 days of dual antiplatelet therapy and following that just daily aspirin for secondary prevention.. - Continue controlling blood pressure and further risk factor modifications. -- Appreciate assistance and recommendation by neurology service. - 2D echo demonstrating preserved ejection fraction and no significant valvular disorder - PT/OT recommending CIR. insurance company declined inpatient rehab and patient was discharged home with home health services instead.   Essential hypertension  -Continue current  antihypertensive agents and follow vital signs.  Hyperlipidemia - Continue Lipitor.   urothelial carcinoma  -- metastasis to bone and lymph nodes -- he has been receiving enfortumab vedotin  and pembrolizumab  -- he is started on palliative radiation for pain control. - Continue outpatient follow-up with oncology service.   GERD - Continue PPI and nightly famotidine . -Lifestyle modifications discussed with patient.   Consultants: Neurology service, inpatient rehab Procedures performed: See below for x-ray reports. Disposition: Home with home health services. Diet recommendation: Heart healthy diet.  DISCHARGE MEDICATION: Allergies as of 06/01/2024   No Known Allergies      Medication List     STOP taking these medications    cephALEXin  500 MG capsule Commonly known as: KEFLEX        TAKE these medications    aspirin EC 81 MG tablet Take 1 tablet (81 mg total) by mouth daily. Swallow whole. Start taking on: June 02, 2024   atorvastatin 20 MG tablet Commonly known as: LIPITOR Take 1 tablet (20 mg total) by mouth every evening.   clopidogrel 75 MG tablet Commonly known as: PLAVIX Take 1 tablet (75 mg total) by mouth daily. Start taking on: June 02, 2024   famotidine  20 MG tablet Commonly known as: PEPCID  Take 1 tablet (20 mg total) by mouth at bedtime.   feeding supplement Liqd Take 237 mLs by mouth 2 (two) times daily between meals.   guaiFENesin-dextromethorphan 100-10 MG/5ML syrup Commonly known as: ROBITUSSIN DM Take 5 mLs by mouth every 4 (four) hours as needed for cough.   HYDROcodone -acetaminophen  5-325 MG tablet Commonly known as: NORCO/VICODIN Take 2 tablets by mouth every 6 (six) hours as needed for severe pain (pain score 7-10). What changed: how much to take   loratadine 10 MG tablet Commonly known  as: CLARITIN Take 1 tablet (10 mg total) by mouth daily as needed for allergies (congestion).   mirtazapine  15 MG tablet Commonly known  as: Remeron  Take 1 tablet (15 mg total) by mouth at bedtime.   ondansetron  8 MG tablet Commonly known as: Zofran  Take 1 tablet (8 mg total) by mouth every 8 (eight) hours as needed for nausea or vomiting.   pantoprazole  40 MG tablet Commonly known as: PROTONIX  Take 1 tablet (40 mg total) by mouth daily. Start taking on: June 02, 2024   tamsulosin 0.4 MG Caps capsule Commonly known as: FLOMAX Take 0.4 mg by mouth daily.        Follow-up Information     Davonna Siad, MD. Schedule an appointment as soon as possible for a visit in 10 day(s).   Specialty: Oncology Contact information: [address] [place] [address] [phone]                Discharge Exam: Filed Weights   05/27/24 1222  Weight: 65.3 kg   General exam: Alert, awake, oriented x 3; following commands appropriately.  In no major distress. Respiratory system: Good saturation on room air.  Normal respiratory effort appreciated on exam. Cardiovascular system: Rate controlled, no rubs, no gallops, no JVD. Gastrointestinal system: Abdomen is nondistended, soft and nontender. No organomegaly or masses felt. Normal bowel sounds heard. Central nervous system: Moving 4 limbs spontaneously.  Generally weak.  No focal neurological deficits. Extremities: No cyanosis or clubbing. Skin: No petechiae. Psychiatry: Judgement and insight appear normal.  Flat affect appreciated on exam.    Condition at discharge: Stable.  The results of significant diagnostics from this hospitalization (including imaging, microbiology, ancillary and laboratory) are listed below for reference.   Imaging Studies: ECHOCARDIOGRAM COMPLETE Result Date: 05/28/2024    ECHOCARDIOGRAM REPORT   Patient Name:   Dean Randolph Date of Exam: 05/28/2024 Medical Rec #:  [id number]       Height:       69.0 in Accession #:    [id number]      Weight:       144.0 lb Date of Birth:  04/13/1941      BSA:          1.797 m Patient Age:    83  years        BP:           142/75 mmHg Patient Gender: M               HR:           100 bpm. Exam Location:  [name] Procedure: 2D Echo, Cardiac Doppler and Color Doppler (Both Spectral and Color            Flow Doppler were utilized during procedure). Indications:    Stroke I63.9  History:        Patient has no prior history of Echocardiogram examinations.                 Stroke and cancer, Signs/Symptoms:Altered Mental Status; Risk                 Factors:Hypertension and Dyslipidemia.  Sonographer:    Koleen Popper RDCS Referring Phys: [phone] JACOB J STINSON IMPRESSIONS  1. Left ventricular ejection fraction, by estimation, is 55 to 60%. The left ventricle has normal function. The left ventricle has no regional wall motion abnormalities. Left ventricular diastolic parameters were normal.  2. Right ventricular systolic function is normal. The  right ventricular size is normal.  3. The mitral valve is normal in structure. No evidence of mitral valve regurgitation. No evidence of mitral stenosis.  4. The aortic valve is tricuspid. There is mild calcification of the aortic valve. There is mild thickening of the aortic valve. Aortic valve regurgitation is not visualized. Aortic valve sclerosis/calcification is present, without any evidence of aortic stenosis. FINDINGS  Left Ventricle: Left ventricular ejection fraction, by estimation, is 55 to 60%. The left ventricle has normal function. The left ventricle has no regional wall motion abnormalities. The left ventricular internal cavity size was normal in size. There is  no left ventricular hypertrophy. Left ventricular diastolic parameters were normal. Right Ventricle: The right ventricular size is normal. No increase in right ventricular wall thickness. Right ventricular systolic function is normal. Left Atrium: Left atrial size was normal in size. Right Atrium: Right atrial size was normal in size. Pericardium: There is no evidence of pericardial effusion. Mitral  Valve: The mitral valve is normal in structure. Mild mitral annular calcification. No evidence of mitral valve regurgitation. No evidence of mitral valve stenosis. Tricuspid Valve: The tricuspid valve is normal in structure. Tricuspid valve regurgitation is not demonstrated. Aortic Valve: The aortic valve is tricuspid. There is mild calcification of the aortic valve. There is mild thickening of the aortic valve. Aortic valve regurgitation is not visualized. Aortic valve sclerosis/calcification is present, without any evidence of aortic stenosis. Pulmonic Valve: The pulmonic valve was grossly normal. Pulmonic valve regurgitation is not visualized. No evidence of pulmonic stenosis. Aorta: The aortic root and ascending aorta are structurally normal, with no evidence of dilitation. IAS/Shunts: No atrial level shunt detected by color flow Doppler.  LEFT VENTRICLE PLAX 2D LVIDd:         3.80 cm   Diastology LVIDs:         2.70 cm   LV e' medial:    5.22 cm/s LV PW:         1.00 cm   LV E/e' medial:  11.3 LV IVS:        1.10 cm   LV e' lateral:   8.49 cm/s LVOT diam:     1.80 cm   LV E/e' lateral: 7.0 LV SV:         43 LV SV Index:   24 LVOT Area:     2.54 cm  RIGHT VENTRICLE RV S prime:     20.10 cm/s TAPSE (M-mode): 2.0 cm LEFT ATRIUM           Index LA diam:      3.20 cm 1.78 cm/m LA Vol (A2C): 27.4 ml 15.25 ml/m  AORTIC VALVE LVOT Vmax:   106.00 cm/s LVOT Vmean:  65.200 cm/s LVOT VTI:    0.169 m  AORTA Ao Root diam: 3.10 cm Ao Asc diam:  3.20 cm MITRAL VALVE MV Area (PHT): 4.49 cm     SHUNTS MV Decel Time: 169 msec     Systemic VTI:  0.17 m MV E velocity: 59.10 cm/s   Systemic Diam: 1.80 cm MV A velocity: 109.00 cm/s MV E/A ratio:  0.54 Mihai Croitoru MD Electronically signed by Jerel Balding MD Signature Date/Time: 05/28/2024/2:10:34 PM    Final    CT ANGIO HEAD NECK W WO CM Result Date: 05/28/2024 EXAM: CTA HEAD AND NECK WITHOUT AND WITH 05/28/2024 09:09:58 AM TECHNIQUE: CTA of the head and neck was  performed without and with the administration of 75 mL of intravenous iohexol  (OMNIPAQUE ) 350 MG/ML  injection. Multiplanar 2D and/or 3D reformatted images are provided for review. Automated exposure control, iterative reconstruction, and/or weight based adjustment of the mA/kV was utilized to reduce the radiation dose to as low as reasonably achievable. Stenosis of the internal carotid arteries measured using NASCET criteria. COMPARISON: CT and MRI of the head dated 05/27/2024. Recent CT angiogram of the chest performed 05/21/2024. CLINICAL HISTORY: Neuro deficit, acute, stroke suspected. FINDINGS: CTA NECK: AORTIC ARCH AND ARCH VESSELS: There is mild calcific plaque within the aortic arch. No dissection or arterial injury. No significant stenosis of the brachiocephalic or subclavian arteries. CERVICAL CAROTID ARTERIES: There is calcific and noncalcific plaque present within the origin of the right internal carotid artery, particularly along the posterior wall, with approximately 40% luminal stenosis. The cervical segment of the right internal carotid artery is otherwise normal in caliber. There is calcific and noncalcific plaque within the posterolateral origin of the left internal carotid artery, with less than 10% luminal stenosis. The remainder of the cervical segment is normal in caliber. No dissection or arterial injury. CERVICAL VERTEBRAL ARTERIES: The left vertebral artery is dominant. Both vertebral arteries are normal in caliber throughout their respective courses. No dissection, arterial injury, or significant stenosis. LUNGS AND MEDIASTINUM: Mediastinal lymphadenopathy, better demonstrated on recent CT angiogram of the chest performed 05/21/2024. SOFT TISSUES: No cervical lymphadenopathy. The patient is status post bilateral lens replacement. No acute abnormality. BONES: No acute abnormality. CTA HEAD: ANTERIOR CIRCULATION: There is mild calcific plaque within the carotid siphons and communicating  segments of the internal carotid arteries, with mild stenosis less than 30%. No significant stenosis of the anterior cerebral arteries. No significant stenosis of the middle cerebral arteries. No aneurysm. POSTERIOR CIRCULATION: There is moderate luminal irregularity of the P1 segment of the left posterior cerebral artery with moderate stenosis. There is mild-to-moderate stenosis of the P2 segment of the right posterior cerebral artery. No significant stenosis of the basilar artery. No significant stenosis of the vertebral arteries. No aneurysm. OTHER: Age-related atrophy and mild-to-moderate periventricular white matter disease. No dural venous sinus thrombosis on this non-dedicated study. IMPRESSION: 1. Moderate luminal irregularity of the P1 segment of the left posterior cerebral artery with moderate stenosis. 2. Mild-to-moderate stenosis of the P2 segment of the right posterior cerebral artery. 3. Calcific and noncalcific plaque within the origin of the right internal carotid artery, particularly along the posterior wall, with approximately 40% luminal stenosis. The cervical segment is otherwise normal in caliber. 4. Calcific and noncalcific plaque within the posterolateral origin of the left internal carotid artery, with less than 10% luminal stenosis. The remainder of the cervical segment is normal in caliber. 5. Mild calcific plaque within the carotid siphons and communicating segments of the internal carotid arteries, with mild stenosis less than 30%. Electronically signed by: Evalene Coho MD 05/28/2024 09:58 AM EST RP Workstation: [id number]   MR Brain W and Wo Contrast Result Date: 05/27/2024 EXAM: MRI BRAIN WITH AND WITHOUT CONTRAST 05/27/2024 03:01:24 PM TECHNIQUE: Multiplanar multisequence MRI of the head/brain was performed with and without the administration of intravenous contrast. 6 mL (gadobutrol  (GADAVIST ) 1 MMOL/ML injection 6 mL GADOBUTROL  1 MMOL/ML IV SOLN). COMPARISON: Head CT  05/27/2024. CLINICAL HISTORY: Headache, history of cancer. FINDINGS: The examination is intermittently mildly to moderately motion degraded. BRAIN AND VENTRICLES: A 7 mm linear focus of mildly restricted diffusion involving posterior left occipital cortex and a 3 mm focus of mildly restricted diffusion involving right occipital subcortical white matter are consistent with acute to early subacute infarcts.  Chronic microhemorrhages are noted in the left thalamus. T2 hyperintensities in the cerebral white matter bilaterally are nonspecific but compatible with mild chronic small vessel ischemic disease. There are chronic lacunar infarcts in the basal ganglia bilaterally. No mass, midline shift, hydrocephalus, extra axial fluid collection, or abnormal intracranial enhancement is identified. There is moderate cerebral atrophy. Major intracranial vascular flow voids are preserved. ORBITS: Bilateral cataract extraction. SINUSES: No acute abnormality. BONES AND SOFT TISSUES: Normal bone marrow signal and enhancement. No acute soft tissue abnormality. IMPRESSION: 1. Small acute to early subacute bilateral occipital infarcts. 2. Mild chronic small vessel ischemic disease and moderate cerebral atrophy. 3. No evidence of intracranial metastatic disease. Electronically signed by: Dasie Hamburg MD 05/27/2024 03:46 PM EST RP Workstation: [id number]   CT Head Wo Contrast Result Date: 05/27/2024 EXAM: CT HEAD WITHOUT CONTRAST 05/27/2024 12:58:28 PM TECHNIQUE: CT of the head was performed without the administration of intravenous contrast. Automated exposure control, iterative reconstruction, and/or weight based adjustment of the mA/kV was utilized to reduce the radiation dose to as low as reasonably achievable. COMPARISON: None available. CLINICAL HISTORY: Mental status change, unknown cause. FINDINGS: BRAIN AND VENTRICLES: No acute hemorrhage. No evidence of acute infarct. No hydrocephalus. No extra-axial collection. No mass  effect or midline shift. There is age-related atrophy and mild-to-moderate periventricular white matter disease. There is moderate calcific atheromatous disease within the carotid siphons. ORBITS: No acute abnormality. Patient is status post bilateral lens replacement. SINUSES: No acute abnormality. SOFT TISSUES AND SKULL: No acute soft tissue abnormality. No skull fracture. IMPRESSION: 1. No acute intracranial abnormality. 2. Age-related atrophy and mild-to-moderate periventricular white matter disease. 3. Moderate calcific atheromatous disease within the carotid siphons. Electronically signed by: Evalene Coho MD 05/27/2024 01:02 PM EST RP Workstation: [id number]   CT ABDOMEN PELVIS W CONTRAST Result Date: 05/22/2024 EXAM: CT ABDOMEN AND PELVIS WITH CONTRAST 05/21/2024 11:55:59 PM TECHNIQUE: CT of the abdomen and pelvis was performed with the administration of 75 mL of iohexol  (OMNIPAQUE ) 350 MG/ML injection. Multiplanar reformatted images are provided for review. Automated exposure control, iterative reconstruction, and/or weight-based adjustment of the mA/kV was utilized to reduce the radiation dose to as low as reasonably achievable. COMPARISON: 03/19/2024 CLINICAL HISTORY: workup for pyelo on the right side. Flank pain. Known metastatic disease. FINDINGS: LOWER CHEST: See chest CT report today. LIVER: Right hepatic cyst is unchanged. No suspicious focal hepatic abnormality. GALLBLADDER AND BILE DUCTS: Prior cholecystectomy. Pneumobilia is present. No biliary ductal dilatation. SPLEEN: No acute abnormality. PANCREAS: No acute abnormality. ADRENAL GLANDS: No acute abnormality. KIDNEYS, URETERS AND BLADDER: Left renal mass again noted measuring 3.9 x 3.2 cm compared to 4.1 x 3.4 cm previously. Enlarging left upper pole renal mass now measuring 3.1 x 2.6 cm compared to 1.8 x 1.3 cm previously. Perinephric metastatic nodules noted along the anterior perirenal fascia are again noted, unchanged. No stones in  the kidneys or ureters. Small benign-appearing cysts in the right kidney are unchanged. No evidence of pyelonephritis. No hydronephrosis. No perinephric or periureteral stranding. Foley catheter within the bladder, which is decompressed. GI AND BOWEL: Stomach demonstrates no acute abnormality. There is no bowel obstruction. PERITONEUM AND RETROPERITONEUM: Extensive retroperitoneal adenopathy is unchanged. Retrocrural adenopathy is stable. Peritoneal metastases inferior to the left kidney measures 4.4 x 3.6 cm compared to 5.2 x 4.9 cm previously. No ascites. No free air. VASCULATURE: Aorta is normal in caliber. Aortic atherosclerosis. LYMPH NODES: Extensive retroperitoneal adenopathy is unchanged. Retrocrural adenopathy is stable. REPRODUCTIVE ORGANS: Mildly enlarged prostate. BONES AND SOFT TISSUES:  Lytic lesions noted at T11, L1, and L2. Pelvic metastases seen on prior PET CT not as well visualized on today's CT. Right ischial tuberosity lytic lesion is unchanged. No focal soft tissue abnormality. IMPRESSION: 1. Enhancing left mid-pole renal mass is again noted, not significantly changed. Enlarging enhancing mass in the upper pole of the left kidney compatible with second primary or metastatic focus. 2. Peritoneal nodules along the anterior pararenal fascia and inferior to the left kidney stable or slightly smaller since prior study. 3. Retrocrural and retroperitoneal adenopathy not significantly changed. 4. No acute abnormality within the right kidney. 5. Worsening osseous metastatic disease in the lower thoracic and upper lumbar spine. Electronically signed by: Franky Crease MD 05/22/2024 12:20 AM EST RP Workstation: [id number]   CT Angio Chest PE W and/or Wo Contrast Result Date: 05/22/2024 EXAM: CTA CHEST AORTA 05/21/2024 11:55:59 PM TECHNIQUE: CTA of the chest was performed after the administration of 75 mL of iohexol  (OMNIPAQUE ) 350 MG/ML injection. Multiplanar reformatted images are provided for review.  MIP images are provided for review. Automated exposure control, iterative reconstruction, and/or weight based adjustment of the mA/kV was utilized to reduce the radiation dose to as low as reasonably achievable. COMPARISON: 03/19/2024, PET CT 03/30/2024. CLINICAL HISTORY: rule out PE/infiltrate. FINDINGS: AORTA: No thoracic aortic dissection. No aneurysm. MEDIASTINUM: Mediastinal adenopathy again noted. Subcarinal adenopathy is enlarged with a short axis diameter of 2.2 cm compared to 1.7 cm previously. Large right paratracheal mass has a short axis diameter of 3.5 cm compared to 3.4 cm, not significantly changed. Prevascular lymph node has a short axis diameter of 1.8 cm compared to 1.6 cm previously. Bilateral hilar adenopathy stable. The heart and pericardium demonstrate no acute abnormality. LYMPH NODES: No axillary lymphadenopathy. Mediastinal and hilar adenopathy as described above. LUNGS AND PLEURA: Numerous bilateral pulmonary metastases again noted, the largest in the right lower lobe measuring 12 x 11 mm compared to 13 x 9 mm previously. Overall pulmonary metastatic disease similar to prior study. Trace bilateral pleural effusions with bibasilar atelectasis. No pneumothorax. UPPER ABDOMEN: Stable moderate-sized hiatal hernia. SOFT TISSUES AND BONES: Right lateral 5th and 6th rib fractures noted, new since prior study. No acute soft tissue abnormality. IMPRESSION: 1. No evidence of pulmonary embolism. 2. Mediastinal and bilateral hilar adenopathy , mostly unchanged. However, subcarinal lymph nodes have enlarged since the prior study. 3. Numerous bilateral pulmonary metastases, overall similar to prior study. 4. Right lateral 5th and 6th rib fractures, new since prior study. Electronically signed by: Franky Crease MD 05/22/2024 12:08 AM EST RP Workstation: [id number]   DG Chest Portable 1 View Result Date: 05/21/2024 CLINICAL DATA:  Right-sided chest pain EXAM: PORTABLE CHEST 1 VIEW COMPARISON:  05/01/2024  FINDINGS: Single frontal view of the chest demonstrates an enlarged cardiac silhouette. Lung volumes are diminished, with increased central pulmonary vascular prominence. Increased density at the left lung base consistent with consolidation and/or effusion. No pneumothorax. No acute bony abnormalities. IMPRESSION: 1. Increased density at the left lung base consistent with consolidation and/or effusion. 2. Low lung volumes, with increased central pulmonary vascular congestion. 3. Enlarged cardiac silhouette. Electronically Signed   By: Ozell Daring M.D.   On: 05/21/2024 22:26    Microbiology: Results for orders placed or performed during the hospital encounter of 05/27/24  Urine Culture     Status: Abnormal (Preliminary result)   Collection Time: 05/27/24 12:36 PM   Specimen: Urine, Random  Result Value Ref Range Status   Specimen Description   Final  URINE, RANDOM Performed at [facility], [address], [place], [place] [address]    Special Requests   Final    NONE Reflexed from [id number] Performed at [facility], [address] [place], [place] [address]    Culture (A)  Final    20,000 COLONIES/mL STENOTROPHOMONAS MALTOPHILIA Sent to [facility] for further susceptibility testing. Performed at [facility], [address], [place], [place] [address]    Report Status PENDING  Incomplete  MRSA Next Gen by PCR, Nasal     Status: None   Collection Time: 05/27/24  6:15 PM   Specimen: Nasal Mucosa; Nasal Swab  Result Value Ref Range Status   MRSA by PCR Next Gen NOT DETECTED NOT DETECTED Final    Comment: (NOTE) The GeneXpert MRSA Assay (FDA approved for NASAL specimens only), is one component of a comprehensive MRSA colonization surveillance program. It is not intended to diagnose MRSA infection nor to guide or monitor treatment for MRSA infections. Test performance is not FDA approved in patients less than 46 years old. Performed at [facility], [address] [place],  [place] [address]     Labs: CBC: Recent Labs  Lab 05/27/24 1326 05/29/24 0701  WBC 9.5 8.4  NEUTROABS 7.6  --   HGB 9.4* 9.2*  HCT 27.4* 28.7*  MCV 100.4* 97.3  PLT 317 308   Basic Metabolic Panel: Recent Labs  Lab 05/27/24 1326 05/29/24 0355 06/01/24 0823  NA 136 137 134*  K 4.3 3.5 3.4*  CL 99 103 100  CO2 25 23 25   GLUCOSE 102* 86 101*  BUN 23 18 24*  CREATININE 0.86 0.76 0.81  CALCIUM  9.6 8.7* 8.6*   Liver Function Tests: Recent Labs  Lab 05/27/24 1326  AST 35  ALT 23  ALKPHOS 194*  BILITOT 0.4  PROT 7.6  ALBUMIN 3.4*   CBG: No results for input(s): GLUCAP in the last 168 hours.  Discharge time spent:  35 minutes.  Signed: Eric Nunnery, MD [facility] 06/01/2024

## 2024-06-01 NOTE — Progress Notes (Signed)
 Pt had 1 episode of nausea and vomiting during the shift, Zofran  administered and pt ad relief. Pts daughter Channing would also like the day shift doc to call her today with updates on patient, she has a few concerns. [phone]

## 2024-06-01 NOTE — Plan of Care (Signed)
  Problem: Health Behavior/Discharge Planning: Goal: Ability to manage health-related needs will improve Outcome: Progressing   Problem: Clinical Measurements: Goal: Ability to maintain clinical measurements within normal limits will improve Outcome: Progressing Goal: Diagnostic test results will improve Outcome: Progressing Goal: Respiratory complications will improve Outcome: Progressing Goal: Cardiovascular complication will be avoided Outcome: Progressing   Problem: Activity: Goal: Risk for activity intolerance will decrease Outcome: Progressing   Problem: Nutrition: Goal: Adequate nutrition will be maintained Outcome: Progressing   Problem: Coping: Goal: Level of anxiety will decrease Outcome: Progressing   Problem: Elimination: Goal: Will not experience complications related to bowel motility Outcome: Progressing   Problem: Pain Managment: Goal: General experience of comfort will improve and/or be controlled Outcome: Progressing   Problem: Safety: Goal: Ability to remain free from injury will improve Outcome: Progressing   Problem: Education: Goal: Knowledge of disease or condition will improve Outcome: Progressing Goal: Knowledge of secondary prevention will improve (MUST DOCUMENT ALL) Outcome: Progressing Goal: Knowledge of patient specific risk factors will improve (DELETE if not current risk factor) Outcome: Progressing   Problem: Ischemic Stroke/TIA Tissue Perfusion: Goal: Complications of ischemic stroke/TIA will be minimized Outcome: Progressing   Problem: Coping: Goal: Will verbalize positive feelings about self Outcome: Progressing

## 2024-06-02 ENCOUNTER — Inpatient Hospital Stay (HOSPITAL_COMMUNITY)
Admission: EM | Admit: 2024-06-02 | Discharge: 2024-06-29 | DRG: 871 | Disposition: E | Attending: Internal Medicine | Admitting: Internal Medicine

## 2024-06-02 ENCOUNTER — Encounter (HOSPITAL_COMMUNITY): Payer: Self-pay | Admitting: Emergency Medicine

## 2024-06-02 ENCOUNTER — Other Ambulatory Visit: Payer: Self-pay

## 2024-06-02 ENCOUNTER — Emergency Department (HOSPITAL_COMMUNITY)

## 2024-06-02 DIAGNOSIS — C779 Secondary and unspecified malignant neoplasm of lymph node, unspecified: Secondary | ICD-10-CM | POA: Diagnosis present

## 2024-06-02 DIAGNOSIS — R627 Adult failure to thrive: Secondary | ICD-10-CM

## 2024-06-02 DIAGNOSIS — Z7902 Long term (current) use of antithrombotics/antiplatelets: Secondary | ICD-10-CM | POA: Diagnosis not present

## 2024-06-02 DIAGNOSIS — Z7982 Long term (current) use of aspirin: Secondary | ICD-10-CM | POA: Diagnosis not present

## 2024-06-02 DIAGNOSIS — A419 Sepsis, unspecified organism: Secondary | ICD-10-CM | POA: Diagnosis present

## 2024-06-02 DIAGNOSIS — R1312 Dysphagia, oropharyngeal phase: Secondary | ICD-10-CM | POA: Diagnosis present

## 2024-06-02 DIAGNOSIS — E46 Unspecified protein-calorie malnutrition: Secondary | ICD-10-CM | POA: Diagnosis not present

## 2024-06-02 DIAGNOSIS — Z66 Do not resuscitate: Secondary | ICD-10-CM | POA: Diagnosis present

## 2024-06-02 DIAGNOSIS — K219 Gastro-esophageal reflux disease without esophagitis: Secondary | ICD-10-CM | POA: Diagnosis present

## 2024-06-02 DIAGNOSIS — D630 Anemia in neoplastic disease: Secondary | ICD-10-CM | POA: Diagnosis present

## 2024-06-02 DIAGNOSIS — N390 Urinary tract infection, site not specified: Secondary | ICD-10-CM | POA: Diagnosis present

## 2024-06-02 DIAGNOSIS — E441 Mild protein-calorie malnutrition: Secondary | ICD-10-CM | POA: Diagnosis present

## 2024-06-02 DIAGNOSIS — C689 Malignant neoplasm of urinary organ, unspecified: Secondary | ICD-10-CM | POA: Diagnosis not present

## 2024-06-02 DIAGNOSIS — I4510 Unspecified right bundle-branch block: Secondary | ICD-10-CM | POA: Diagnosis present

## 2024-06-02 DIAGNOSIS — J690 Pneumonitis due to inhalation of food and vomit: Secondary | ICD-10-CM

## 2024-06-02 DIAGNOSIS — J189 Pneumonia, unspecified organism: Principal | ICD-10-CM | POA: Diagnosis present

## 2024-06-02 DIAGNOSIS — Z515 Encounter for palliative care: Secondary | ICD-10-CM | POA: Diagnosis not present

## 2024-06-02 DIAGNOSIS — Z9221 Personal history of antineoplastic chemotherapy: Secondary | ICD-10-CM | POA: Diagnosis not present

## 2024-06-02 DIAGNOSIS — R6520 Severe sepsis without septic shock: Secondary | ICD-10-CM | POA: Diagnosis present

## 2024-06-02 DIAGNOSIS — Y95 Nosocomial condition: Secondary | ICD-10-CM | POA: Diagnosis present

## 2024-06-02 DIAGNOSIS — Z923 Personal history of irradiation: Secondary | ICD-10-CM | POA: Diagnosis not present

## 2024-06-02 DIAGNOSIS — E8809 Other disorders of plasma-protein metabolism, not elsewhere classified: Secondary | ICD-10-CM | POA: Diagnosis present

## 2024-06-02 DIAGNOSIS — R338 Other retention of urine: Secondary | ICD-10-CM | POA: Diagnosis not present

## 2024-06-02 DIAGNOSIS — Z8673 Personal history of transient ischemic attack (TIA), and cerebral infarction without residual deficits: Secondary | ICD-10-CM | POA: Diagnosis not present

## 2024-06-02 DIAGNOSIS — E872 Acidosis, unspecified: Secondary | ICD-10-CM | POA: Diagnosis present

## 2024-06-02 DIAGNOSIS — C7951 Secondary malignant neoplasm of bone: Secondary | ICD-10-CM | POA: Diagnosis present

## 2024-06-02 DIAGNOSIS — Z7189 Other specified counseling: Secondary | ICD-10-CM | POA: Diagnosis not present

## 2024-06-02 DIAGNOSIS — C679 Malignant neoplasm of bladder, unspecified: Secondary | ICD-10-CM | POA: Diagnosis present

## 2024-06-02 DIAGNOSIS — I10 Essential (primary) hypertension: Secondary | ICD-10-CM | POA: Diagnosis present

## 2024-06-02 LAB — CBC WITH DIFFERENTIAL/PLATELET
Abs Immature Granulocytes: 0.05 K/uL (ref 0.00–0.07)
Basophils Absolute: 0.1 K/uL (ref 0.0–0.1)
Basophils Relative: 0 %
Eosinophils Absolute: 0.1 K/uL (ref 0.0–0.5)
Eosinophils Relative: 0 %
HCT: 33.3 % — ABNORMAL LOW (ref 39.0–52.0)
Hemoglobin: 10.4 g/dL — ABNORMAL LOW (ref 13.0–17.0)
Immature Granulocytes: 0 %
Lymphocytes Relative: 4 %
Lymphs Abs: 0.6 K/uL — ABNORMAL LOW (ref 0.7–4.0)
MCH: 30.3 pg (ref 26.0–34.0)
MCHC: 31.2 g/dL (ref 30.0–36.0)
MCV: 97.1 fL (ref 80.0–100.0)
Monocytes Absolute: 0.6 K/uL (ref 0.1–1.0)
Monocytes Relative: 5 %
Neutro Abs: 12.7 K/uL — ABNORMAL HIGH (ref 1.7–7.7)
Neutrophils Relative %: 91 %
Platelets: 394 K/uL (ref 150–400)
RBC: 3.43 MIL/uL — ABNORMAL LOW (ref 4.22–5.81)
RDW: 22.3 % — ABNORMAL HIGH (ref 11.5–15.5)
WBC: 14.1 K/uL — ABNORMAL HIGH (ref 4.0–10.5)
nRBC: 0 % (ref 0.0–0.2)

## 2024-06-02 LAB — URINALYSIS, W/ REFLEX TO CULTURE (INFECTION SUSPECTED)
Bacteria, UA: NONE SEEN
Bilirubin Urine: NEGATIVE
Glucose, UA: NEGATIVE mg/dL
Ketones, ur: NEGATIVE mg/dL
Nitrite: NEGATIVE
Protein, ur: 100 mg/dL — AB
Specific Gravity, Urine: 1.016 (ref 1.005–1.030)
WBC, UA: >50 WBC/hpf (ref 0–5)
pH: 5 (ref 5.0–8.0)

## 2024-06-02 LAB — URINE CULTURE: Culture: 20000 — AB

## 2024-06-02 LAB — COMPREHENSIVE METABOLIC PANEL WITH GFR
ALT: 34 U/L (ref 0–44)
AST: 45 U/L — ABNORMAL HIGH (ref 15–41)
Albumin: 3.3 g/dL — ABNORMAL LOW (ref 3.5–5.0)
Alkaline Phosphatase: 208 U/L — ABNORMAL HIGH (ref 38–126)
Anion gap: 12 (ref 5–15)
BUN: 22 mg/dL (ref 8–23)
CO2: 26 mmol/L (ref 22–32)
Calcium: 9.2 mg/dL (ref 8.9–10.3)
Chloride: 98 mmol/L (ref 98–111)
Creatinine, Ser: 0.93 mg/dL (ref 0.61–1.24)
GFR, Estimated: >60 mL/min (ref >60)
Glucose, Bld: 132 mg/dL — ABNORMAL HIGH (ref 70–99)
Potassium: 3.9 mmol/L (ref 3.5–5.1)
Sodium: 136 mmol/L (ref 135–145)
Total Bilirubin: 0.5 mg/dL (ref 0.0–1.2)
Total Protein: 7.5 g/dL (ref 6.5–8.1)

## 2024-06-02 LAB — PRO BRAIN NATRIURETIC PEPTIDE: Pro Brain Natriuretic Peptide: 215 pg/mL (ref <300.0)

## 2024-06-02 LAB — LACTIC ACID, PLASMA
Lactic Acid, Venous: 3.3 mmol/L (ref 0.5–1.9)
Lactic Acid, Venous: 2 mmol/L (ref 0.5–1.9)

## 2024-06-02 LAB — SUSCEPTIBILITY, AER + ANAEROB

## 2024-06-02 LAB — SUSCEPTIBILITY RESULT

## 2024-06-02 LAB — PROTIME-INR
INR: 1.2 (ref 0.8–1.2)
Prothrombin Time: 15.7 s — ABNORMAL HIGH (ref 11.4–15.2)

## 2024-06-02 MED ORDER — SODIUM CHLORIDE 0.9 % IV BOLUS
1000.0000 mL | Freq: Once | INTRAVENOUS | Status: AC
  Administered 2024-06-02: 1000 mL via INTRAVENOUS

## 2024-06-02 MED ORDER — VANCOMYCIN HCL IN DEXTROSE 1-5 GM/200ML-% IV SOLN
1000.0000 mg | Freq: Once | INTRAVENOUS | Status: AC
  Administered 2024-06-02: 1000 mg via INTRAVENOUS
  Filled 2024-06-02: qty 200

## 2024-06-02 MED ORDER — CHLORHEXIDINE GLUCONATE CLOTH 2 % EX PADS
6.0000 | MEDICATED_PAD | Freq: Every day | CUTANEOUS | Status: DC
  Administered 2024-06-04 – 2024-06-07 (×4): 6 via TOPICAL

## 2024-06-02 MED ORDER — SODIUM CHLORIDE 0.9 % IV SOLN
2.0000 g | Freq: Once | INTRAVENOUS | Status: AC
  Administered 2024-06-02: 2 g via INTRAVENOUS
  Filled 2024-06-02: qty 12.5

## 2024-06-02 NOTE — ED Triage Notes (Signed)
 Pt via EMS from home after hospital discharge 2 days ago. Family reports new cough today that will not resolve and he felt SOB after coughing fit. GCS 15, a/o x 4.   BP 168/78 HR 120 RR 20 O2 94% RA, increased to 96% on 3L T 99.2 axillary  Lungs clear but diminished bilaterally. Pt has a foley catheter.

## 2024-06-02 NOTE — ED Notes (Signed)
 Date and time results received: 06/02/24 9:14 PM  Test: Lactic acid Critical Value: 3.3   Name of Provider Notified: DOROTHA Sermon  Orders Received? Or Actions Taken?: Awaiting orders

## 2024-06-02 NOTE — Plan of Care (Signed)
   Problem: Education: Goal: Knowledge of General Education information will improve Description Including pain rating scale, medication(s)/side effects and non-pharmacologic comfort measures Outcome: Progressing   Problem: Health Behavior/Discharge Planning: Goal: Ability to manage health-related needs will improve Outcome: Progressing

## 2024-06-03 DIAGNOSIS — R627 Adult failure to thrive: Secondary | ICD-10-CM

## 2024-06-03 DIAGNOSIS — J690 Pneumonitis due to inhalation of food and vomit: Secondary | ICD-10-CM

## 2024-06-03 LAB — CBC
HCT: 27.6 % — ABNORMAL LOW (ref 39.0–52.0)
Hemoglobin: 8.7 g/dL — ABNORMAL LOW (ref 13.0–17.0)
MCH: 30.1 pg (ref 26.0–34.0)
MCHC: 31.5 g/dL (ref 30.0–36.0)
MCV: 95.5 fL (ref 80.0–100.0)
Platelets: 301 K/uL (ref 150–400)
RBC: 2.89 MIL/uL — ABNORMAL LOW (ref 4.22–5.81)
RDW: 22.6 % — ABNORMAL HIGH (ref 11.5–15.5)
WBC: 14.4 K/uL — ABNORMAL HIGH (ref 4.0–10.5)
nRBC: 0 % (ref 0.0–0.2)

## 2024-06-03 LAB — COMPREHENSIVE METABOLIC PANEL WITH GFR
ALT: 25 U/L (ref 0–44)
AST: 32 U/L (ref 15–41)
Albumin: 2.7 g/dL — ABNORMAL LOW (ref 3.5–5.0)
Alkaline Phosphatase: 158 U/L — ABNORMAL HIGH (ref 38–126)
Anion gap: 9 (ref 5–15)
BUN: 22 mg/dL (ref 8–23)
CO2: 25 mmol/L (ref 22–32)
Calcium: 8.4 mg/dL — ABNORMAL LOW (ref 8.9–10.3)
Chloride: 103 mmol/L (ref 98–111)
Creatinine, Ser: 0.81 mg/dL (ref 0.61–1.24)
GFR, Estimated: >60 mL/min (ref >60)
Glucose, Bld: 108 mg/dL — ABNORMAL HIGH (ref 70–99)
Potassium: 3.4 mmol/L — ABNORMAL LOW (ref 3.5–5.1)
Sodium: 137 mmol/L (ref 135–145)
Total Bilirubin: 0.4 mg/dL (ref 0.0–1.2)
Total Protein: 6 g/dL — ABNORMAL LOW (ref 6.5–8.1)

## 2024-06-03 LAB — STREP PNEUMONIAE URINARY ANTIGEN: Strep Pneumo Urinary Antigen: NEGATIVE

## 2024-06-03 LAB — PHOSPHORUS: Phosphorus: 3.3 mg/dL (ref 2.5–4.6)

## 2024-06-03 LAB — PROCALCITONIN: Procalcitonin: 0.24 ng/mL

## 2024-06-03 LAB — MAGNESIUM: Magnesium: 2 mg/dL (ref 1.7–2.4)

## 2024-06-03 MED ORDER — SODIUM CHLORIDE 0.9 % IV SOLN
2.0000 g | Freq: Two times a day (BID) | INTRAVENOUS | Status: DC
  Administered 2024-06-03: 2 g via INTRAVENOUS
  Filled 2024-06-03: qty 12.5

## 2024-06-03 MED ORDER — POTASSIUM CHLORIDE 10 MEQ/100ML IV SOLN
10.0000 meq | INTRAVENOUS | Status: AC
  Administered 2024-06-03 (×3): 10 meq via INTRAVENOUS
  Filled 2024-06-03 (×3): qty 100

## 2024-06-03 MED ORDER — FAMOTIDINE 20 MG PO TABS
20.0000 mg | ORAL_TABLET | Freq: Every day | ORAL | Status: DC
  Administered 2024-06-03 – 2024-06-05 (×3): 20 mg via ORAL
  Filled 2024-06-03 (×3): qty 1

## 2024-06-03 MED ORDER — ACETAMINOPHEN 325 MG PO TABS
650.0000 mg | ORAL_TABLET | Freq: Four times a day (QID) | ORAL | Status: DC | PRN
  Administered 2024-06-05: 650 mg via ORAL
  Filled 2024-06-03: qty 2

## 2024-06-03 MED ORDER — SULFAMETHOXAZOLE-TRIMETHOPRIM 800-160 MG PO TABS
1.0000 | ORAL_TABLET | Freq: Two times a day (BID) | ORAL | Status: DC
  Administered 2024-06-03 – 2024-06-07 (×7): 1 via ORAL
  Filled 2024-06-03 (×7): qty 1

## 2024-06-03 MED ORDER — ASPIRIN 81 MG PO TBEC
81.0000 mg | DELAYED_RELEASE_TABLET | Freq: Every day | ORAL | Status: DC
  Administered 2024-06-03 – 2024-06-07 (×4): 81 mg via ORAL
  Filled 2024-06-03 (×4): qty 1

## 2024-06-03 MED ORDER — VANCOMYCIN HCL IN DEXTROSE 1-5 GM/200ML-% IV SOLN: 1000.0000 mg | INTRAVENOUS | Status: DC

## 2024-06-03 MED ORDER — ONDANSETRON HCL 4 MG PO TABS: 4.0000 mg | ORAL_TABLET | Freq: Four times a day (QID) | ORAL | Status: DC | PRN

## 2024-06-03 MED ORDER — ONDANSETRON HCL 4 MG/2ML IJ SOLN: 4.0000 mg | Freq: Four times a day (QID) | INTRAMUSCULAR | Status: DC | PRN

## 2024-06-03 MED ORDER — ACETAMINOPHEN 650 MG RE SUPP: 650.0000 mg | Freq: Four times a day (QID) | RECTAL | Status: DC | PRN

## 2024-06-03 MED ORDER — ENSURE PLUS HIGH PROTEIN PO LIQD
237.0000 mL | Freq: Two times a day (BID) | ORAL | Status: DC
  Administered 2024-06-03 – 2024-06-07 (×6): 237 mL via ORAL

## 2024-06-03 MED ORDER — HYDRALAZINE HCL 20 MG/ML IJ SOLN: 10.0000 mg | Freq: Four times a day (QID) | INTRAMUSCULAR | Status: DC | PRN

## 2024-06-03 MED ORDER — LACTATED RINGERS IV SOLN: INTRAVENOUS | Status: AC

## 2024-06-03 MED ORDER — DM-GUAIFENESIN ER 30-600 MG PO TB12
1.0000 | ORAL_TABLET | Freq: Two times a day (BID) | ORAL | Status: DC
  Administered 2024-06-03 – 2024-06-04 (×4): 1 via ORAL
  Filled 2024-06-03 (×4): qty 1

## 2024-06-03 MED ORDER — MIRTAZAPINE 15 MG PO TABS
15.0000 mg | ORAL_TABLET | Freq: Every day | ORAL | Status: DC
  Administered 2024-06-03 – 2024-06-05 (×3): 15 mg via ORAL
  Filled 2024-06-03 (×3): qty 1

## 2024-06-03 MED ORDER — ATORVASTATIN CALCIUM 20 MG PO TABS
20.0000 mg | ORAL_TABLET | Freq: Every evening | ORAL | Status: DC
  Administered 2024-06-03 – 2024-06-06 (×4): 20 mg via ORAL
  Filled 2024-06-03 (×4): qty 1

## 2024-06-03 MED ORDER — ENOXAPARIN SODIUM 40 MG/0.4ML IJ SOSY
40.0000 mg | PREFILLED_SYRINGE | INTRAMUSCULAR | Status: DC
  Administered 2024-06-03 – 2024-06-07 (×5): 40 mg via SUBCUTANEOUS
  Filled 2024-06-03 (×5): qty 0.4

## 2024-06-03 MED ORDER — PANTOPRAZOLE SODIUM 40 MG PO TBEC
40.0000 mg | DELAYED_RELEASE_TABLET | Freq: Every day | ORAL | Status: DC
  Administered 2024-06-03 – 2024-06-07 (×3): 40 mg via ORAL
  Filled 2024-06-03 (×4): qty 1

## 2024-06-03 MED ORDER — TAMSULOSIN HCL 0.4 MG PO CAPS
0.4000 mg | ORAL_CAPSULE | Freq: Every day | ORAL | Status: DC
  Administered 2024-06-03 – 2024-06-07 (×4): 0.4 mg via ORAL
  Filled 2024-06-03 (×4): qty 1

## 2024-06-03 MED ORDER — CLOPIDOGREL BISULFATE 75 MG PO TABS
75.0000 mg | ORAL_TABLET | Freq: Every day | ORAL | Status: DC
  Administered 2024-06-03 – 2024-06-07 (×4): 75 mg via ORAL
  Filled 2024-06-03 (×4): qty 1

## 2024-06-03 NOTE — Progress Notes (Signed)
 Pharmacy Antibiotic Note  Dean Randolph is a 83 y.o. male admitted on 06/02/2024 with pneumonia, sepsis, and UTI.  Pharmacy has been consulted for vancomycin  and cefepime  dosing.  Plan: Vancomycin  1000mg  IV Q24H. Goal AUC 400-550.  Expected AUC 430. Cefepime  2g IV Q12H.  Height: 5' 7 (170.2 cm) Weight: 63.5 kg (140 lb) IBW/kg (Calculated) : 66.1  Temp (24hrs), Avg:98.5 F (36.9 C), Min:97.5 F (36.4 C), Max:99.1 F (37.3 C)  Recent Labs  Lab 05/27/24 1326 05/29/24 0355 05/29/24 0701 06/01/24 0823 06/02/24 2022 06/02/24 2220  WBC 9.5  --  8.4  --  14.1*  --   CREATININE 0.86 0.76  --  0.81 0.93  --   LATICACIDVEN  --   --   --   --  3.3* 2.0*    Estimated Creatinine Clearance: 55 mL/min (by C-G formula based on SCr of 0.93 mg/dL).    No Known Allergies   Thank you for allowing pharmacy to be a part of this patient's care.  Marvetta Dauphin, PharmD, BCPS  06/03/2024 2:46 AM

## 2024-06-03 NOTE — Progress Notes (Signed)
 ASSUMPTION OF CARE NOTE   06/03/2024 2:55 PM  Dean Randolph was seen and examined.  The H&P by the admitting provider, orders, imaging was reviewed.  Please see new orders.  Will continue to follow.  Discussed with pharm D and changing antibiotics to Bactrim .  He may have aspirated at home given report of worsening coughing spell after eating at home.  He is at high risk.  SLP eval requested.  Agree with palliative consultation given ongoing failure to thrive despite maximal interventions and hospitalizations.    Vitals:   06/03/24 1200 06/03/24 1300  BP: 110/78 (!) 114/58  Pulse: 97 100  Resp: (!) 23 (!) 25  Temp:    SpO2: 95% 93%    Results for orders placed or performed during the hospital encounter of 06/02/24  Culture, blood (Routine x 2)   Collection Time: 06/02/24  8:22 PM   Specimen: Right Antecubital; Blood  Result Value Ref Range   Specimen Description RIGHT ANTECUBITAL    Special Requests      BOTTLES DRAWN AEROBIC AND ANAEROBIC Blood Culture adequate volume   Culture      NO GROWTH < 12 HOURS Performed at [facility], [address] [place], [place] [address]    Report Status PENDING   Comprehensive metabolic panel   Collection Time: 06/02/24  8:22 PM  Result Value Ref Range   Sodium 136 135 - 145 mmol/L   Potassium 3.9 3.5 - 5.1 mmol/L   Chloride 98 98 - 111 mmol/L   CO2 26 22 - 32 mmol/L   Glucose, Bld 132 (H) 70 - 99 mg/dL   BUN 22 8 - 23 mg/dL   Creatinine, Ser 9.06 0.61 - 1.24 mg/dL   Calcium  9.2 8.9 - 10.3 mg/dL   Total Protein 7.5 6.5 - 8.1 g/dL   Albumin 3.3 (L) 3.5 - 5.0 g/dL   AST 45 (H) 15 - 41 U/L   ALT 34 0 - 44 U/L   Alkaline Phosphatase 208 (H) 38 - 126 U/L   Total Bilirubin 0.5 0.0 - 1.2 mg/dL   GFR, Estimated >39 >39 mL/min   Anion gap 12 5 - 15  Lactic acid, plasma   Collection Time: 06/02/24  8:22 PM  Result Value Ref Range   Lactic Acid, Venous 3.3 (HH) 0.5 - 1.9 mmol/L  CBC with Differential   Collection Time: 06/02/24  8:22 PM   Result Value Ref Range   WBC 14.1 (H) 4.0 - 10.5 K/uL   RBC 3.43 (L) 4.22 - 5.81 MIL/uL   Hemoglobin 10.4 (L) 13.0 - 17.0 g/dL   HCT 66.6 (L) 60.9 - 47.9 %   MCV 97.1 80.0 - 100.0 fL   MCH 30.3 26.0 - 34.0 pg   MCHC 31.2 30.0 - 36.0 g/dL   RDW 77.6 (H) 88.4 - 84.4 %   Platelets 394 150 - 400 K/uL   nRBC 0.0 0.0 - 0.2 %   Neutrophils Relative % 91 %   Neutro Abs 12.7 (H) 1.7 - 7.7 K/uL   Lymphocytes Relative 4 %   Lymphs Abs 0.6 (L) 0.7 - 4.0 K/uL   Monocytes Relative 5 %   Monocytes Absolute 0.6 0.1 - 1.0 K/uL   Eosinophils Relative 0 %   Eosinophils Absolute 0.1 0.0 - 0.5 K/uL   Basophils Relative 0 %   Basophils Absolute 0.1 0.0 - 0.1 K/uL   Immature Granulocytes 0 %   Abs Immature Granulocytes 0.05 0.00 - 0.07 K/uL  Protime-INR  Collection Time: 06/02/24  8:22 PM  Result Value Ref Range   Prothrombin Time 15.7 (H) 11.4 - 15.2 seconds   INR 1.2 0.8 - 1.2  Urinalysis, w/ Reflex to Culture (Infection Suspected) -Urine, Catheterized; Indwelling urinary catheter   Collection Time: 06/02/24  8:22 PM  Result Value Ref Range   Specimen Source URINE, CATHETERIZED    Color, Urine YELLOW YELLOW   APPearance CLOUDY (A) CLEAR   Specific Gravity, Urine 1.016 1.005 - 1.030   pH 5.0 5.0 - 8.0   Glucose, UA NEGATIVE NEGATIVE mg/dL   Hgb urine dipstick MODERATE (A) NEGATIVE   Bilirubin Urine NEGATIVE NEGATIVE   Ketones, ur NEGATIVE NEGATIVE mg/dL   Protein, ur 899 (A) NEGATIVE mg/dL   Nitrite NEGATIVE NEGATIVE   Leukocytes,Ua LARGE (A) NEGATIVE   RBC / HPF 21-50 0 - 5 RBC/hpf   WBC, UA >50 0 - 5 WBC/hpf   Bacteria, UA NONE SEEN NONE SEEN   Squamous Epithelial / HPF 0-5 0 - 5 /HPF   WBC Clumps PRESENT    Mucus PRESENT    Budding Yeast PRESENT    Crystals PRESENT (A) NEGATIVE  Pro Brain natriuretic peptide   Collection Time: 06/02/24  8:22 PM  Result Value Ref Range   Pro Brain Natriuretic Peptide 215.0 <300.0 pg/mL  Strep pneumoniae urinary antigen   Collection Time:  06/02/24  8:22 PM  Result Value Ref Range   Strep Pneumo Urinary Antigen NEGATIVE NEGATIVE  Culture, blood (Routine x 2)   Collection Time: 06/02/24  8:42 PM   Specimen: BLOOD RIGHT ARM  Result Value Ref Range   Specimen Description BLOOD RIGHT ARM    Special Requests      BOTTLES DRAWN AEROBIC AND ANAEROBIC Blood Culture adequate volume   Culture      NO GROWTH < 12 HOURS Performed at [facility], [address], [place], [place] [address]    Report Status PENDING   Lactic acid, plasma   Collection Time: 06/02/24 10:20 PM  Result Value Ref Range   Lactic Acid, Venous 2.0 (HH) 0.5 - 1.9 mmol/L  Comprehensive metabolic panel   Collection Time: 06/03/24  4:06 AM  Result Value Ref Range   Sodium 137 135 - 145 mmol/L   Potassium 3.4 (L) 3.5 - 5.1 mmol/L   Chloride 103 98 - 111 mmol/L   CO2 25 22 - 32 mmol/L   Glucose, Bld 108 (H) 70 - 99 mg/dL   BUN 22 8 - 23 mg/dL   Creatinine, Ser 9.18 0.61 - 1.24 mg/dL   Calcium  8.4 (L) 8.9 - 10.3 mg/dL   Total Protein 6.0 (L) 6.5 - 8.1 g/dL   Albumin 2.7 (L) 3.5 - 5.0 g/dL   AST 32 15 - 41 U/L   ALT 25 0 - 44 U/L   Alkaline Phosphatase 158 (H) 38 - 126 U/L   Total Bilirubin 0.4 0.0 - 1.2 mg/dL   GFR, Estimated >39 >39 mL/min   Anion gap 9 5 - 15  CBC   Collection Time: 06/03/24  4:06 AM  Result Value Ref Range   WBC 14.4 (H) 4.0 - 10.5 K/uL   RBC 2.89 (L) 4.22 - 5.81 MIL/uL   Hemoglobin 8.7 (L) 13.0 - 17.0 g/dL   HCT 72.3 (L) 60.9 - 47.9 %   MCV 95.5 80.0 - 100.0 fL   MCH 30.1 26.0 - 34.0 pg   MCHC 31.5 30.0 - 36.0 g/dL   RDW 77.3 (H) 88.4 - 84.4 %  Platelets 301 150 - 400 K/uL   nRBC 0.0 0.0 - 0.2 %  Magnesium   Collection Time: 06/03/24  4:06 AM  Result Value Ref Range   Magnesium 2.0 1.7 - 2.4 mg/dL  Phosphorus   Collection Time: 06/03/24  4:06 AM  Result Value Ref Range   Phosphorus 3.3 2.5 - 4.6 mg/dL  Procalcitonin   Collection Time: 06/03/24  5:00 AM  Result Value Ref Range   Procalcitonin 0.24 ng/mL   Prolonged  services time: 45 mins  KYM Louder, MD [facility]   06/02/2024  8:10 PM How to contact the [facility] Attending or Consulting provider 7A - 7P or covering provider during after hours 7P -7A, for this patient?  Check the care team in [facility] and look for a) attending/consulting TR[facility] provider listed and b) the [facility] team listed Log into [email or web address] and use [facility]'s universal password to access. If you do not have the password, please contact the hospital operator. Locate the [facility] provider you are looking for under [facility] and page to a number that you can be directly reached. If you still have difficulty reaching the provider, please page the [facility] (Director on Call) for the Hospitalists listed on amion for assistance.

## 2024-06-03 NOTE — Hospital Course (Signed)
 83 y.o. 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  Daughter at bedside states that patient has had a mild cough prior to being discharged, this worsened (a day after being discharged) after eating a meal at home, cough was productive with yellowish-green sputum and it was associated with shortness of breath when he has a coughing fit.   ED course In the emergency department, patient was tachypneic, tachycardic, temperature was 97.5 F, O2 sat was 95 to 98% on 3 LPM of oxygen (patient does not use oxygen at baseline).  Workup in the ED showed normocytic anemia and leukocytosis.  BMP was normal except for blood glucose of 132 lactic acid 3.3 > 2.0.  Urinalysis was suspicious for UTI.  Urine culture was positive for Stenotrophomonas maltophilia.  Chest x-ray was suggestive of worsening edema or infection.  EKG personally reviewed which showed wide QRS tachycardia heart rate of 148 bpm with RBBB.  Patient was treated with IV vancomycin  and cefepime , IV hydration.  Unfortunately, the patient continued to have signs of aspiration.  He continued to have recurrent signs of fever and SIRS during hospitalization despite being on antibiotics.  His antibiotics were broadened to Zosyn .  He was continued on Bactrim  DS.  Palliative medicine was consulted.  The patient remained very deconditioned without significant improvement.  Goals of care were discussed with the patient and family.  Ultimately, patient's family wanted to transition him to comfort measures.  TOC assisted in transitioning patient home with hospice care. Unfortunately, the patient's medical condition declined overnight from 06/07/2024 to July 03, 2024.  He had increased agitation and aspirational events.  The patient  was subsequently placed on a morphine  drip.  Palliative medicine continued to follow and adjust medications for comfort.  After discussion with family, the patient was continued on full comfort measures with the expectation of in-hospital death.

## 2024-06-03 NOTE — TOC Initial Note (Signed)
 Transition of Care [facility]) - Initial/Assessment Note    Patient Details  Name: Dean Randolph MRN: [id number] Date of Birth: 31-Aug-1940  Transition of Care [facility]) CM/SW Contact:    [name], LCSW Phone Number: 06/03/2024, 9:00 AM  Clinical Narrative:                 Pt assessed due to high risk for readmission score. Pt recently discharged on 12/4. Pt lives with his spouse at home. Pt is active with outpatient palliative care provided by [facility]. Pt is active for [facility] services with [facility] for PT/OT/RN/Aide/SW.  ICM will continue to follow for discharge planning needs.   Expected Discharge Plan: Home w Home Health Services Barriers to Discharge: Continued Medical Work up   Patient Goals and CMS Choice Patient states their goals for this hospitalization and ongoing recovery are:: To feel better          Expected Discharge Plan and Services In-house Referral: Clinical Social Work Discharge Planning Services: NA Post Acute Care Choice: Home Health, Resumption of Svcs/PTA Provider Living arrangements for the past 2 months: Single Family Home                                      Prior Living Arrangements/Services Living arrangements for the past 2 months: Single Family Home Lives with:: Spouse Patient language and need for interpreter reviewed:: Yes Do you feel safe going back to the place where you live?: Yes      Need for Family Participation in Patient Care: Yes (Comment) Care giver support system in place?: Yes (comment) Current home services: DME Criminal Activity/Legal Involvement Pertinent to Current Situation/Hospitalization: No - Comment as needed  Activities of Daily Living   ADL Screening (condition at time of admission) Independently performs ADLs?: No Does the patient have a NEW difficulty with bathing/dressing/toileting/self-feeding that is expected to last >3 days?: Yes (Initiates electronic notice to provider for possible OT consult) Does  the patient have a NEW difficulty with getting in/out of bed, walking, or climbing stairs that is expected to last >3 days?: Yes (Initiates electronic notice to provider for possible PT consult) Does the patient have a NEW difficulty with communication that is expected to last >3 days?: No Is the patient deaf or have difficulty hearing?: No Does the patient have difficulty seeing, even when wearing glasses/contacts?: No Does the patient have difficulty concentrating, remembering, or making decisions?: Yes  Permission Sought/Granted Permission sought to share information with : Family Supports, Oceanographer granted to share information with : Yes, Verbal Permission Granted              Emotional Assessment   Attitude/Demeanor/Rapport: Unable to Assess Affect (typically observed): Unable to Assess Orientation: : Oriented to Self, Oriented to  Time Alcohol  / Substance Use: Not Applicable Psych Involvement: No (comment)  Admission diagnosis:  HCAP (healthcare-associated pneumonia) [J18.9] Community acquired pneumonia, unspecified laterality [J18.9] Patient Active Problem List   Diagnosis Date Noted   HCAP (healthcare-associated pneumonia) 06/02/2024   Encephalopathy acute 05/27/2024   Stroke (HCC) 05/27/2024   Protein-calorie malnutrition, severe 05/03/2024   Sepsis due to pneumonia (HCC) 05/01/2024   Urothelial carcinoma (HCC) 04/04/2024   Renal mass, left 03/23/2024   Back pain 03/23/2024   Elevated alkaline phosphatase level 04/23/2022   History of ERCP 04/23/2022   Colon cancer screening 04/23/2022   Calculus of gallbladder without cholecystitis without  obstruction    Acute cholangitis (HCC) 02/09/2021   Pancreatitis due to biliary obstruction/S/p ERCP with stent Placement on 02/06/21 02/09/2021   Essential hypertension 02/07/2021   Cholangitis (HCC)    Choledocholithiasis/Cholelithiasis--S/p ERCP with Plastic Stent Placement on 02/06/21  02/04/2021   Hyperglycemia 02/04/2021   Intermittent abdominal pain 02/04/2021   Early satiety 02/04/2021   Abnormal weight loss 02/04/2021   Hyperbilirubinemia 02/04/2021   Elevated liver enzymes 02/04/2021   Hematemesis 02/04/2021   Iron deficiency anemia    Transaminitis    PCP:  Davonna Siad, MD Pharmacy:   [facility] [phone] - [place], [place] - [address] [place] [address] Phone: [phone] Fax: [phone]     Social Drivers of Health (SDOH) Social History: SDOH Screenings   Food Insecurity: No Food Insecurity (06/02/2024)  Housing: Low Risk  (06/02/2024)  Transportation Needs: No Transportation Needs (06/02/2024)  Utilities: Not At Risk (06/02/2024)  Depression (PHQ2-9): Low Risk  (05/15/2024)  Social Connections: Moderately Integrated (06/02/2024)  Tobacco Use: Low Risk  (06/02/2024)   SDOH Interventions:     Readmission Risk Interventions    06/03/2024    8:58 AM 05/28/2024   12:10 PM 05/02/2024    9:55 AM  Readmission Risk Prevention Plan  Transportation Screening Complete Complete Complete  PCP or Specialist Appt within 5-7 Days   Not Complete  Not Complete comments   PCP list added to AVS  PCP or Specialist Appt within 3-5 Days  Not Complete   Home Care Screening   Complete  Medication Review (RN CM)   Complete  HRI or Home Care Consult  Complete   Social Work Consult for Recovery Care Planning/Counseling  Complete   Palliative Care Screening  Not Complete   Medication Review Oceanographer) Complete Complete   PCP or Specialist appointment within 3-5 days of discharge Complete    HRI or Home Care Consult Complete    SW Recovery Care/Counseling Consult Complete    Palliative Care Screening Complete    Skilled Nursing Facility Not Applicable

## 2024-06-03 NOTE — H&P (Addendum)
 History and Physical    Patient: Dean Randolph FMW:[id number] DOB: 10/01/1940 DOA: 06/02/2024 DOS: the patient was seen and examined on 06/03/2024 PCP: Davonna Siad, MD  Patient coming from: Home  Chief Complaint:  Chief Complaint  Patient presents with   Cough   HPI: Dean Randolph is a 83 y.o. male with medical history significant of hypertension, history of bladder cancer with metastatic disease to lumbar and thoracic spine who was recently admitted from 11/29 to 12/4 due to acute metabolic encephalopathy due to small acute to early subacute bilateral occipital infarcts.  Daughter at bedside states that patient has had a mild cough prior to being discharged, this worsened today (a day after being discharged), cough was productive with yellowish-green sputum and it was associated with shortness of breath when he has a coughing fit.  ED course In the emergency department, patient was tachypneic, tachycardic, temperature was 97.5 F, O2 sat was 95 to 98% on 3 LPM of oxygen (patient does not use oxygen at baseline).  Workup in the ED showed normocytic anemia and leukocytosis.  BMP was normal except for blood glucose of 132 lactic acid 3.3 > 2.0.  Urinalysis was suspicious for UTI.  Urine culture was positive for Stenotrophomonas maltophilia Chest x-ray was suggestive of worsening edema or infection. EKG personally reviewed which showed wide QRS tachycardia heart rate of 148 bpm with RBBB Patient was treated with IV vancomycin  and cefepime , IV hydration was provided.  Review of Systems: As mentioned in the history of present illness. All other systems reviewed and are negative. Past Medical History:  Diagnosis Date   Bladder cancer (HCC)    Hypertension    Past Surgical History:  Procedure Laterality Date   BILIARY DILATION  03/12/2022   Procedure: BILIARY DILATION;  Surgeon: Wilhelmenia Aloha Raddle., MD;  Location: [facility];  Service: Gastroenterology;;   BILIARY STENT  PLACEMENT N/A 02/06/2021   Procedure: BILIARY STENT PLACEMENT;  Surgeon: Golda Claudis PENNER, MD;  Location: [facility];  Service: Gastroenterology;  Laterality: N/A;   BILIARY STENT PLACEMENT N/A 03/26/2021   Procedure: BILIARY STENT PLACEMENT 10 FRENCH BY 9cm;  Surgeon: Golda Claudis PENNER, MD;  Location: [facility];  Service: Endoscopy;  Laterality: N/A;   BILIARY STENT PLACEMENT N/A 01/19/2022   Procedure: BILIARY STENT PLACEMENT;  Surgeon: Wilhelmenia Aloha Raddle., MD;  Location: [facility];  Service: Gastroenterology;  Laterality: N/A;   BIOPSY  01/19/2022   Procedure: BIOPSY;  Surgeon: Wilhelmenia Aloha Raddle., MD;  Location: [facility];  Service: Gastroenterology;;   CATARACT EXTRACTION Bilateral    CHOLECYSTECTOMY N/A 04/25/2021   Procedure: LAPAROSCOPIC CHOLECYSTECTOMY;  Surgeon: Mavis Anes, MD;  Location: [facility];  Service: General;  Laterality: N/A;   ENDOSCOPIC RETROGRADE CHOLANGIOPANCREATOGRAPHY (ERCP) WITH PROPOFOL  N/A 01/19/2022   Procedure: ENDOSCOPIC RETROGRADE CHOLANGIOPANCREATOGRAPHY (ERCP) WITH PROPOFOL ;  Surgeon: Wilhelmenia Aloha Raddle., MD;  Location: [facility];  Service: Gastroenterology;  Laterality: N/A;   ENDOSCOPIC RETROGRADE CHOLANGIOPANCREATOGRAPHY (ERCP) WITH PROPOFOL  N/A 03/12/2022   Procedure: ENDOSCOPIC RETROGRADE CHOLANGIOPANCREATOGRAPHY (ERCP) WITH PROPOFOL ;  Surgeon: Wilhelmenia Aloha Raddle., MD;  Location: [facility];  Service: Gastroenterology;  Laterality: N/A;   ERCP N/A 02/06/2021   Procedure: ENDOSCOPIC RETROGRADE CHOLANGIOPANCREATOGRAPHY (ERCP);  Surgeon: Golda Claudis PENNER, MD;  Location: [facility];  Service: Gastroenterology;  Laterality: N/A;   ERCP N/A 03/26/2021   Procedure: ENDOSCOPIC RETROGRADE CHOLANGIOPANCREATOGRAPHY (ERCP) with EXTENDED SPHINCTEROTOMY;  Surgeon: Golda Claudis PENNER, MD;  Location: [facility];  Service: Endoscopy;  Laterality: N/A;   ERCP N/A 10/27/2021   Procedure:  ENDOSCOPIC RETROGRADE CHOLANGIOPANCREATOGRAPHY (ERCP);  Surgeon: Golda Claudis PENNER, MD;  Location: [facility];  Service: Endoscopy;  Laterality: N/A;  no site    GASTROINTESTINAL STENT REMOVAL N/A 03/26/2021   Procedure: GASTROINTESTINAL STENT REMOVAL;  Surgeon: Golda Claudis PENNER, MD;  Location: [facility];  Service: Endoscopy;  Laterality: N/A;   GASTROINTESTINAL STENT REMOVAL N/A 10/27/2021   Procedure: GASTROINTESTINAL STENT EXCHANGE;  Surgeon: Golda Claudis PENNER, MD;  Location: [facility];  Service: Endoscopy;  Laterality: N/A;  no site    REMOVAL OF STONES N/A 03/26/2021   Procedure: REMOVAL OF STONES WITH EXTRACTION BASKET;  Surgeon: Golda Claudis PENNER, MD;  Location: [facility];  Service: Endoscopy;  Laterality: N/A;   REMOVAL OF STONES N/A 10/27/2021   Procedure: REMOVAL OF STONES;  Surgeon: Golda Claudis PENNER, MD;  Location: [facility];  Service: Endoscopy;  Laterality: N/A;   REMOVAL OF STONES  01/19/2022   Procedure: REMOVAL OF STONES;  Surgeon: Wilhelmenia Aloha Raddle., MD;  Location: [facility];  Service: Gastroenterology;;   REMOVAL OF STONES  03/12/2022   Procedure: REMOVAL OF STONES;  Surgeon: Wilhelmenia Aloha Raddle., MD;  Location: [facility];  Service: Gastroenterology;;   ANNETT N/A 02/06/2021   Procedure: ANNETT;  Surgeon: Golda Claudis PENNER, MD;  Location: [facility];  Service: Gastroenterology;  Laterality: N/A;   SPHINCTEROTOMY N/A 10/27/2021   Procedure: SPHINCTEROTOMY;  Surgeon: Golda Claudis PENNER, MD;  Location: [facility];  Service: Endoscopy;  Laterality: N/A;   SPYGLASS CHOLANGIOSCOPY N/A 01/19/2022   Procedure: SPYGLASS CHOLANGIOSCOPY;  Surgeon: Wilhelmenia Aloha Raddle., MD;  Location: [facility];  Service: Gastroenterology;  Laterality: N/A;   SPYGLASS CHOLANGIOSCOPY N/A 03/12/2022   Procedure: DEBHOJDD CHOLANGIOSCOPY;  Surgeon: Wilhelmenia Aloha Raddle., MD;  Location: [facility];  Service: Gastroenterology;  Laterality: N/A;   SPYGLASS LITHOTRIPSY N/A 03/12/2022   Procedure: DEBHOJDD LITHOTRIPSY;  Surgeon: Wilhelmenia Aloha Raddle., MD;  Location: [facility];   Service: Gastroenterology;  Laterality: N/A;   STENT REMOVAL  01/19/2022   Procedure: STENT REMOVAL;  Surgeon: Wilhelmenia Aloha Raddle., MD;  Location: [facility];  Service: Gastroenterology;;   CLEDA REMOVAL  03/12/2022   Procedure: STENT REMOVAL;  Surgeon: Wilhelmenia Aloha Raddle., MD;  Location: [facility];  Service: Gastroenterology;;   STONE EXTRACTION WITH BASKET  01/19/2022   Procedure: STONE EXTRACTION WITH BASKET;  Surgeon: Wilhelmenia Aloha Raddle., MD;  Location: [facility];  Service: Gastroenterology;;   Social History:  reports that he has never smoked. He has been exposed to tobacco smoke. He has never used smokeless tobacco. He reports that he does not drink alcohol  and does not use drugs.  No Known Allergies  Family History  Problem Relation Age of Onset   Stroke Mother    Colon cancer Neg Hx    Esophageal cancer Neg Hx    Inflammatory bowel disease Neg Hx    Liver disease Neg Hx    Pancreatic cancer Neg Hx    Rectal cancer Neg Hx    Stomach cancer Neg Hx     Prior to Admission medications   Medication Sig Start Date End Date Taking? Authorizing Provider  aspirin  EC 81 MG tablet Take 1 tablet (81 mg total) by mouth daily. Swallow whole. 06/02/24   Ricky Fines, MD  atorvastatin  (LIPITOR) 20 MG tablet Take 1 tablet (20 mg total) by mouth every evening. 06/01/24   Ricky Fines, MD  clopidogrel  (PLAVIX ) 75 MG tablet Take 1 tablet (75 mg total) by mouth daily. 06/02/24   Ricky Fines, MD  famotidine  (PEPCID )  20 MG tablet Take 1 tablet (20 mg total) by mouth at bedtime. 06/01/24   Ricky Fines, MD  feeding supplement (ENSURE PLUS HIGH PROTEIN) LIQD Take 237 mLs by mouth 2 (two) times daily between meals. 06/01/24   Ricky Fines, MD  guaiFENesin -dextromethorphan  (ROBITUSSIN DM) 100-10 MG/5ML syrup Take 5 mLs by mouth every 4 (four) hours as needed for cough. 06/01/24   Ricky Fines, MD  HYDROcodone -acetaminophen  (NORCO/VICODIN) 5-325 MG tablet Take 2 tablets by mouth  every 6 (six) hours as needed for severe pain (pain score 7-10). Patient taking differently: Take 1 tablet by mouth every 6 (six) hours as needed for severe pain (pain score 7-10). 04/12/24   Kandala, Hyndavi, MD  loratadine  (CLARITIN ) 10 MG tablet Take 1 tablet (10 mg total) by mouth daily as needed for allergies (congestion). 06/01/24   Ricky Fines, MD  mirtazapine  (REMERON ) 15 MG tablet Take 1 tablet (15 mg total) by mouth at bedtime. 05/15/24   Davonna Siad, MD  ondansetron  (ZOFRAN ) 8 MG tablet Take 1 tablet (8 mg total) by mouth every 8 (eight) hours as needed for nausea or vomiting. 04/06/24   Kandala, Hyndavi, MD  pantoprazole  (PROTONIX ) 40 MG tablet Take 1 tablet (40 mg total) by mouth daily. 06/02/24   Ricky Fines, MD  tamsulosin  (FLOMAX ) 0.4 MG CAPS capsule Take 0.4 mg by mouth daily. 05/11/24   [provider]    Physical Exam: Vitals:   06/02/24 2145 06/02/24 2218 06/02/24 2230 06/02/24 2255  BP: 133/76  126/64 (!) 151/64  Pulse: (!) 116  (!) 117 (!) 115  Resp: (!) 30  (!) 34 (!) 32  Temp:  99.1 F (37.3 C)  98.8 F (37.1 C)  TempSrc:  Oral  Oral  SpO2: 97%  98% 99%  Weight:      Height:        General: Elderly male.  Chronically ill-appearing.  Awake and alert and oriented x3. Not in any acute distress.  HEENT: NCAT.  PERRLA. EOMI. Sclerae anicteric.  Moist mucosal membranes. Neck: Neck supple without lymphadenopathy. No carotid bruits. No masses palpated.  Cardiovascular: Regular rate with normal S1-S2 sounds. No murmurs, rubs or gallops auscultated. No JVD.  Respiratory: Clear breath sounds.  No accessory muscle use. Abdomen: Soft, nontender, nondistended. Active bowel sounds. No masses or hepatosplenomegaly  Skin: No rashes, lesions, or ulcerations.  Dry, warm to touch. Musculoskeletal:  2+ dorsalis pedis and radial pulses. Good ROM.  No contractures  Psychiatric: Intact judgment and insight.  Mood appropriate to current condition. Neurologic: No focal  neurological deficits. Strength is 5/5 x 4.  CN II - XII grossly intact.  Assessment and Plan: Sepsis secondary to presumed HCAP  Patient met sepsis criteria due to tachycardia, tachypnea, leukocytosis and suspicion for pneumonia Patient was started on vancomycin  and cefepime , we shall continue same at this time with plan to de-escalate/discontinue based on blood culture, sputum culture, urine Legionella, strep pneumo and procalcitonin Continue Tylenol  as needed Continue Mucinex , incentive spirometry, flutter valve  Lactic acidosis Lactic acid 3.3 > 2.0, continue gentle hydration Continue to trend lactic acid  UTI POA Urine culture was positive for Stenotrophomonas maltophilia Continue IV cefepime  Foley catheter will be changed  History of stroke Continue Plavix , aspirin , Lipitor Continue PT/OT eval and treat  Essential hypertension-stable No anti - Hypertensive medication noted in patient's med rec, we shall await updated med rec Continue IV hydralazine  10 mg every 6 hours for SBP > 170  Urothelial carcinoma Patient with metastasis to bone  and lymph nodes He was started on palliative radiation for pain control per medical record Continue outpatient follow-up with oncology service  GERD Continue Protonix , Pepcid   Urinary retention Patient's Foley catheter will be changed Continue Flomax   Hypoalbuminemia secondary to mild protein calorie malnutrition Albumin 3.3, protein supplement will be provided  Goals of care Palliative care will be consulted   Advance Care Planning: Full code (patient confirmed this with RN and also with me in the ED in presence of daughter).  Consults: Palliative care  Family Communication: Daughter at bedside (all questions answered to satisfaction)  Severity of Illness: The appropriate patient status for this patient is INPATIENT. Inpatient status is judged to be reasonable and necessary in order to provide the required intensity of service  to ensure the patient's safety. The patient's presenting symptoms, physical exam findings, and initial radiographic and laboratory data in the context of their chronic comorbidities is felt to place them at high risk for further clinical deterioration. Furthermore, it is not anticipated that the patient will be medically stable for discharge from the hospital within 2 midnights of admission.   * I certify that at the point of admission it is my clinical judgment that the patient will require inpatient hospital care spanning beyond 2 midnights from the point of admission due to high intensity of service, high risk for further deterioration and high frequency of surveillance required.*  Author: Rhodia Acres, DO 06/03/2024 12:23 AM  For on call review [email or web address].

## 2024-06-03 NOTE — Evaluation (Signed)
 Physical Therapy Evaluation Patient Details Name: Dean Randolph MRN: [id number] DOB: 1940/11/06 Today's Date: 06/03/2024  History of Present Illness  Dean Randolph is a 83 y.o. male with medical history significant of hypertension, history of bladder cancer with metastatic disease to lumbar and thoracic spine who was recently admitted from 11/29 to 12/4 due to acute metabolic encephalopathy due to small acute to early subacute bilateral occipital infarcts.  Daughter at bedside states that patient has had a mild cough prior to being discharged, this worsened today (a day after being discharged), cough was productive with yellowish-green sputum and it was associated with shortness of breath when he has a coughing fit.  Clinical Impression  Pt states that he feels much better.  Pt tired due to not getting any sleep last night and request to go back to bed following session.  Pt Is mod I in bed mobility and transfers.  Deferred ambulation due to pt wanting to rest.         If plan is discharge home, recommend the following: A little help with walking and/or transfers   Can travel by private vehicle    yes    Equipment Recommendations None recommended by PT  Recommendations for Other Services       Functional Status Assessment Patient has had a recent decline in their functional status and demonstrates the ability to make significant improvements in function in a reasonable and predictable amount of time.     Precautions / Restrictions Precautions Precautions: Fall      Mobility  Bed Mobility Overal bed mobility: Modified Independent                  Transfers Overall transfer level: Modified independent   Transfers: Sit to/from Stand Sit to Stand: Modified independent (Device/Increase time)           General transfer comment: MOd I    Ambulation/Gait               General Gait Details: PT up in ER all night requests to rest        Balance  Fair  plus to good minus with side stepping, able to complete without use of UE but Wants to reach for bed rails.                                            Pertinent Vitals/Pain Pain Assessment Pain Assessment: 0-10 Pain Score: 3  Pain Location: back is sore due to vomitting Pain Intervention(s): Limited activity within patient's tolerance    Home Living Family/patient expects to be discharged to:: Private residence Living Arrangements: Spouse/significant other;Children Available Help at Discharge: Family;Available 24 hours/day Type of Home: House Home Access: Level entry       Home Layout: Laundry or work area in basement Home Equipment: Agricultural Consultant (2 wheels);BSC/3in1;Shower seat;Wheelchair - manual;Cane - single point;Grab bars - toilet;Grab bars - tub/shower Additional Comments: Daugher lives near the home.    Prior Function Prior Level of Function : Independent/Modified Independent             Mobility Comments: Pt reports as community ambulator without AD. The past 2 weeks the pt has needed physical assist to stand. ADLs Comments: Reports independence with ADLs/iADLs     Extremity/Trunk Assessment        Lower Extremity Assessment Lower Extremity Assessment: Overall WFL for  tasks assessed       Communication   Communication Communication: No apparent difficulties    Cognition Arousal: Alert Behavior During Therapy: WFL for tasks assessed/performed   PT - Cognitive impairments: No apparent impairments                         Following commands: Intact       Cueing Cueing Techniques: Verbal cues, Tactile cues     General Comments      Exercises General Exercises - Lower Extremity Ankle Circles/Pumps: 10 reps, Both, Supine Quad Sets: 10 reps, Both, Supine Long Arc Quad: 10 reps, Both, Seated Heel Slides: 10 reps, Both, Supine Hip ABduction/ADduction: 5 reps, Both, Supine Mini-Sqauts:  (sit to stand x 10 ; side  stepping at bedside x 3 Reps)   Assessment/Plan    PT Assessment Patient needs continued PT services (Pt dynamic standing balance is decreased)  PT Problem List Decreased strength;Decreased activity tolerance       PT Treatment Interventions DME instruction;Gait training;Stair training;Functional mobility training;Therapeutic activities;Therapeutic exercise;Balance training;Patient/family education    PT Goals (Current goals can be found in the Care Plan section)       Frequency Min 2X/week     Co-evaluation               AM-PAC PT 6 Clicks Mobility  Outcome Measure Help needed turning from your back to your side while in a flat bed without using bedrails?: None Help needed moving from lying on your back to sitting on the side of a flat bed without using bedrails?: None Help needed moving to and from a bed to a chair (including a wheelchair)?: A Little Help needed standing up from a chair using your arms (e.g., wheelchair or bedside chair)?: A Little Help needed to walk in hospital room?: A Little Help needed climbing 3-5 steps with a railing? : A Lot 6 Click Score: 19    End of Session   Activity Tolerance: Patient tolerated treatment well Patient left: with family/visitor present;with call bell/phone within reach;in bed Nurse Communication: Mobility status PT Visit Diagnosis: Muscle weakness (generalized) (M62.81);Unsteadiness on feet (R26.81)    Time: 1000-1025 PT Time Calculation (min) (ACUTE ONLY): 25 min   Charges:   PT Evaluation $PT Eval Low Complexity: 1 Low PT Treatments $Gait Training: 8-22 mins PT General Charges $$ ACUTE PT VISIT: 1 Visit        Montie Metro, PT CLT [phone]   06/03/2024, 10:50 AM

## 2024-06-03 NOTE — Progress Notes (Addendum)
 79 Adefeso MD asked this RN to confirm code status with this patient. RN, and Consulting Civil Engineer, with daughter at bedside, discussed with patient the events should a code occur. Patient stated he would like us  to try everything once or twice. RN relayed this to MD.   [phone] RN touched base with Adefeso regarding pt BP 123/48 (70). No new orders at this time.   0300 Pt foley cath changed per order of Manfred, MD.

## 2024-06-03 NOTE — Plan of Care (Signed)
  Problem: Education: Goal: Knowledge of General Education information will improve Description: Including pain rating scale, medication(s)/side effects and non-pharmacologic comfort measures Outcome: Not Progressing   Problem: Health Behavior/Discharge Planning: Goal: Ability to manage health-related needs will improve Outcome: Progressing   Problem: Clinical Measurements: Goal: Ability to maintain clinical measurements within normal limits will improve Outcome: Progressing Goal: Will remain free from infection Outcome: Progressing Goal: Diagnostic test results will improve Outcome: Progressing Goal: Respiratory complications will improve Outcome: Progressing Goal: Cardiovascular complication will be avoided Outcome: Progressing   Problem: Activity: Goal: Risk for activity intolerance will decrease Outcome: Not Progressing   Problem: Nutrition: Goal: Adequate nutrition will be maintained Outcome: Not Progressing   Problem: Coping: Goal: Level of anxiety will decrease Outcome: Progressing   Problem: Elimination: Goal: Will not experience complications related to bowel motility Outcome: Progressing Goal: Will not experience complications related to urinary retention Outcome: Progressing   Problem: Pain Managment: Goal: General experience of comfort will improve and/or be controlled Outcome: Progressing   Problem: Safety: Goal: Ability to remain free from injury will improve Outcome: Progressing   Problem: Skin Integrity: Goal: Risk for impaired skin integrity will decrease Outcome: Progressing   Problem: Activity: Goal: Ability to tolerate increased activity will improve Outcome: Not Progressing   Problem: Clinical Measurements: Goal: Ability to maintain a body temperature in the normal range will improve Outcome: Progressing   Problem: Respiratory: Goal: Ability to maintain adequate ventilation will improve Outcome: Progressing Goal: Ability to maintain a  clear airway will improve Outcome: Progressing

## 2024-06-04 DIAGNOSIS — J189 Pneumonia, unspecified organism: Secondary | ICD-10-CM | POA: Diagnosis not present

## 2024-06-04 DIAGNOSIS — R627 Adult failure to thrive: Secondary | ICD-10-CM | POA: Diagnosis not present

## 2024-06-04 DIAGNOSIS — J690 Pneumonitis due to inhalation of food and vomit: Secondary | ICD-10-CM | POA: Diagnosis not present

## 2024-06-04 LAB — CBC WITH DIFFERENTIAL/PLATELET
Abs Immature Granulocytes: 0.05 K/uL (ref 0.00–0.07)
Basophils Absolute: 0.1 K/uL (ref 0.0–0.1)
Basophils Relative: 1 %
Eosinophils Absolute: 0.3 K/uL (ref 0.0–0.5)
Eosinophils Relative: 3 %
HCT: 28 % — ABNORMAL LOW (ref 39.0–52.0)
Hemoglobin: 8.6 g/dL — ABNORMAL LOW (ref 13.0–17.0)
Immature Granulocytes: 0 %
Lymphocytes Relative: 6 %
Lymphs Abs: 0.7 K/uL (ref 0.7–4.0)
MCH: 29 pg (ref 26.0–34.0)
MCHC: 30.7 g/dL (ref 30.0–36.0)
MCV: 94.3 fL (ref 80.0–100.0)
Monocytes Absolute: 0.8 K/uL (ref 0.1–1.0)
Monocytes Relative: 7 %
Neutro Abs: 10 K/uL — ABNORMAL HIGH (ref 1.7–7.7)
Neutrophils Relative %: 83 %
Platelets: 267 K/uL (ref 150–400)
RBC: 2.97 MIL/uL — ABNORMAL LOW (ref 4.22–5.81)
RDW: 16.7 % — ABNORMAL HIGH (ref 11.5–15.5)
WBC: 11.8 K/uL — ABNORMAL HIGH (ref 4.0–10.5)
nRBC: 0 % (ref 0.0–0.2)

## 2024-06-04 LAB — BASIC METABOLIC PANEL WITH GFR
Anion gap: 9 (ref 5–15)
BUN: 19 mg/dL (ref 8–23)
CO2: 24 mmol/L (ref 22–32)
Calcium: 8.6 mg/dL — ABNORMAL LOW (ref 8.9–10.3)
Chloride: 103 mmol/L (ref 98–111)
Creatinine, Ser: 0.76 mg/dL (ref 0.61–1.24)
GFR, Estimated: >60 mL/min (ref >60)
Glucose, Bld: 97 mg/dL (ref 70–99)
Potassium: 3.4 mmol/L — ABNORMAL LOW (ref 3.5–5.1)
Sodium: 136 mmol/L (ref 135–145)

## 2024-06-04 LAB — MAGNESIUM: Magnesium: 1.9 mg/dL (ref 1.7–2.4)

## 2024-06-04 MED ORDER — GUAIFENESIN ER 600 MG PO TB12
600.0000 mg | ORAL_TABLET | Freq: Two times a day (BID) | ORAL | Status: DC
  Administered 2024-06-04: 600 mg via ORAL
  Filled 2024-06-04 (×2): qty 1

## 2024-06-04 MED ORDER — IPRATROPIUM-ALBUTEROL 0.5-2.5 (3) MG/3ML IN SOLN
3.0000 mL | Freq: Three times a day (TID) | RESPIRATORY_TRACT | Status: DC
  Administered 2024-06-04 – 2024-06-07 (×9): 3 mL via RESPIRATORY_TRACT
  Filled 2024-06-04 (×10): qty 3

## 2024-06-04 MED ORDER — DEXTROMETHORPHAN POLISTIREX ER 30 MG/5ML PO SUER
30.0000 mg | Freq: Two times a day (BID) | ORAL | Status: DC
  Administered 2024-06-04 – 2024-06-07 (×4): 30 mg via ORAL
  Filled 2024-06-04 (×4): qty 5

## 2024-06-04 NOTE — Plan of Care (Signed)
  Problem: Education: Goal: Knowledge of General Education information will improve Description: Including pain rating scale, medication(s)/side effects and non-pharmacologic comfort measures Outcome: Progressing   Problem: Health Behavior/Discharge Planning: Goal: Ability to manage health-related needs will improve Outcome: Progressing   Problem: Clinical Measurements: Goal: Ability to maintain clinical measurements within normal limits will improve Outcome: Progressing Goal: Will remain free from infection Outcome: Progressing Goal: Diagnostic test results will improve Outcome: Progressing Goal: Respiratory complications will improve Outcome: Progressing Goal: Cardiovascular complication will be avoided Outcome: Progressing   Problem: Activity: Goal: Risk for activity intolerance will decrease Outcome: Progressing   Problem: Nutrition: Goal: Adequate nutrition will be maintained Outcome: Progressing   Problem: Coping: Goal: Level of anxiety will decrease Outcome: Progressing   Problem: Elimination: Goal: Will not experience complications related to bowel motility Outcome: Progressing Goal: Will not experience complications related to urinary retention Outcome: Progressing   Problem: Pain Managment: Goal: General experience of comfort will improve and/or be controlled Outcome: Progressing   Problem: Safety: Goal: Ability to remain free from injury will improve Outcome: Progressing   Problem: Skin Integrity: Goal: Risk for impaired skin integrity will decrease Outcome: Progressing   Problem: Activity: Goal: Ability to tolerate increased activity will improve Outcome: Progressing   Problem: Clinical Measurements: Goal: Ability to maintain a body temperature in the normal range will improve Outcome: Progressing   Problem: Clinical Measurements: Goal: Ability to maintain a body temperature in the normal range will improve Outcome: Progressing   Problem:  Respiratory: Goal: Ability to maintain adequate ventilation will improve Outcome: Progressing Goal: Ability to maintain a clear airway will improve Outcome: Progressing

## 2024-06-04 NOTE — Progress Notes (Deleted)
 Patient Care Team: Davonna Siad, MD as PCP - General (Oncology) Celestia Joesph SQUIBB, RN as Oncology Nurse Navigator (Medical Oncology) Davonna Siad, MD as Medical Oncologist (Medical Oncology)  Clinic Day:  06/04/2024  Referring physician: Davonna Siad, MD   CHIEF COMPLAINT:  CC: Metastatic urothelial carcinoma    ASSESSMENT & PLAN:   Assessment & Plan: Dean Randolph  is a 83 y.o. male with Metastatic urothelial carcinoma   Assessment and Plan Assessment & Plan  Stage 4 bladder cancer with metastases to bone, lymph nodes Stage IV urothelial carcinoma of upper urinary tract or collecting duct Oncology history below Path IHC staining consistent with urothelial carcinoma Started on EV + pembrolizumab  on 04/17/2024.   - Second cycle was held recently for deconditioning from hospital admission secondary to pneumonia, UTI and sepsis. - Patient has improved from last week and is feeling better. - Will dose reduce enfortumab vedotin  to 1 mg/kg as patient has been losing weight and his functional status has been declining. - Will continue pembrolizumab  at the same dose of 200 mg every 3 weeks - Will proceed with treatment today - Will repeat PET scan after completion of 3 cycles of treatment.  Return to clinic in prior to next cycle of treatment  Hypotension Patient has a blood pressure of 109/20mmHg with a heart rate of 116.  - Will administer 1 L IV fluids today.    Chronic back pain due to bone metastases Back pain from bone metastases secondary to bladder cancer. Prefers medication management over radiation. Percocet twice daily provides adequate pain control. Currently on Hydrocodone  10mg  every 6 hours but is not taking consistently   - Continue pain management as above -Patient is not comfortable sitting in a wheelchair and reports back pain worsening from physical therapy.  Will administer 1 mg Dilaudid  in clinic today. - Will refer to radiation oncology  for palliative radiation to the vertebral lesions for pain control - Will consider adding bone modifying agents once patient is more stable.  This will need dental clearance as well.   Anemia under evaluation The most likely cause of his anemia is due to chronic blood loss or malabsorption syndrome. Lab Results  Component Value Date   IRON 15 (L) 02/04/2021   TIBC 264 02/04/2021   FERRITIN 275 02/04/2021     - We discussed starting IV iron at last visit but it was pending.  Will administer an infusion today.  Repeat labs in 2 months.  Urinary retention with indwelling catheter Managed with indwelling catheter. Recent urology intervention for catheter issues. - Continue follow-up with urologist for catheter management.  Malnutrition and weight loss with poor appetite Significant weight loss from 159 to 144 pounds. Poor appetite and minimal intake. Discussed appetite stimulant to improve nutritional status and mood.  -Will start mirtazapine  at 15mg  at bedtime    The patient understands the plans discussed today and is in agreement with them.  He knows to contact our office if he develops concerns prior to his next appointment.  The total time spent in the appointment was 22 minutes for the encounter with patient, including review of chart and various tests results, discussions about plan of care and coordination of care plan   Dolorez Jeffrey, MD  [facility] [place] - A DEPT OF [facility] [address] [place] [address] Dept: [phone] Dept Fax: [phone]   No orders of the defined types were placed in this  encounter.    ONCOLOGY HISTORY:   Diagnosis: Stage IV urothelial carcinoma of upper urinary tract or collecting duct   -Presentation: Patient seen in the ED on 03/19/2024 for right sided back, right flank, and right lower abdominal pain.  -03/19/2024: CT AP:  4.1 x 3.4 cm heterogeneous mass in the  anterior interpolar left kidney appears to have enhancing central soft tissue nodules although this could represent blood clot within a pre-existing lesion. 16 mm potential enhancing nodule in the anterior right renal hilum. Imaging features are highly suspicious for renal cell carcinoma. Irregular centrally necrotic soft tissue mass in the retroperitoneal space just inferior to the SMA and obscuring the left renal vein, probably occluding/obliterating the left renal venous anatomy. 4.8 x 2.7 cm left periaortic nodal mass with multiple nodules in the left renal hilum and soft tissue encasing the left renal artery. Additional 5.2 x 4.9 cm soft tissue mass in the left abdomen. Borderline enlarged lymph nodes are seen at the aortic bifurcation. No pelvic sidewall lymphadenopathy. Numerous new pulmonary nodules in the lung bases measuring up to 6 mm. Imaging features are highly suspicious for metastatic disease. 14 mm short axis left retrocrural lymph node. Lytic lesions in the L1 and L2 vertebral bodies and posterior right ischial tuberosity, consistent with metastatic disease. -03/19/2024: CT chest: Thoracic nodal and pulmonary parenchymal metastatic disease.  -03/27/2024: Left retroperitoneal mass biopsy.  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  -Immunohistochemistry stains show the neoplasm is positive for cytokeratin AE13, ck8/18, high molecular weight cytokeratin (focal), gata 3, pax8, vimentin. Negative staining for ca-ix, ck7, ck20, cdx2, cd117, nkx3.1. the staining pattern favor metastatic urothelial carcinoma.  -03/30/2024: Initial PET: Left renal hypermetabolic masses consistent with malignancy with associated multiple hypermetabolic retroperitoneal, mediastinal and supraclavicular metastatic lymphadenopathy , osseous, pulmonary and peritoneal  metastasis. -04/12/2024: [facility] NGS: MSI-stable, TMB: Low, 2 mut/Mb, PD-L1: Negative  - BRAF, NTRK 1/2/3, RET, ATM, BRCA1/2, HER2, FGFR 1/2/3: Negative -03/2024-Current: Enfortumab Vedotin  + Pembrolizumab   -Cycle 2 delayed for hospital admission for pneumonia and sepsis  Current Treatment:  Enfortumab Vedotin  + Pembrolizumab   INTERVAL HISTORY:   Discussed the use of AI scribe software for clinical note transcription with the patient, who gave verbal consent to proceed.  History of Present Illness Dean Randolph is an 83 year old male who presents for follow-up and systemic therapy for urothelial carcinoma.  He is accompanied by his daughter today.  He has been experiencing persistent back pain since last Friday, which makes it difficult for him to get up. He takes two pain medication pills in the morning, but it takes time for the medication to take effect. Despite the pain, he manages to walk daily, although he sometimes requires assistance. He recently spent time outside with family while he did yard work.  He has experienced significant weight loss, dropping from 159 pounds to 144 pounds. His appetite is poor, and he consumes very small amounts of food, such as half a bowl of oatmeal and part of a fruit bar for breakfast. He is currently using Ensure to supplement his nutrition. His family is concerned about his nutritional intake, noting that he is eating only 'little bits.'  He has a history of pneumonia and is currently experiencing ongoing congestion and lung changes. He is also experiencing hair loss.  He has been using a catheter and recently underwent testing at [facility] to assess his ability  to release urine on his own. He is on medication to help relax his muscles, and further testing is scheduled for Friday to determine if the catheter can be removed.    I have reviewed the past medical history, past surgical history, social history and family history with the patient and they  are unchanged from previous note.  ALLERGIES:  has no known allergies.  MEDICATIONS:  No current facility-administered medications for this visit.   No current outpatient medications on file.   Facility-Administered Medications Ordered in Other Visits  Medication Dose Route Frequency Provider Last Rate Last Admin   acetaminophen  (TYLENOL ) tablet 650 mg  650 mg Oral Q6H PRN Adefeso, Oladapo, DO       Or   acetaminophen  (TYLENOL ) suppository 650 mg  650 mg Rectal Q6H PRN Adefeso, Oladapo, DO       aspirin  EC tablet 81 mg  81 mg Oral Daily Adefeso, Oladapo, DO   81 mg at 06/04/24 0956   atorvastatin  (LIPITOR) tablet 20 mg  20 mg Oral QPM Adefeso, Oladapo, DO   20 mg at 06/04/24 1755   Chlorhexidine  Gluconate Cloth 2 % PADS 6 each  6 each Topical Q0600 Adefeso, Oladapo, DO   6 each at 06/04/24 0612   clopidogrel  (PLAVIX ) tablet 75 mg  75 mg Oral Daily Adefeso, Oladapo, DO   75 mg at 06/04/24 0956   dextromethorphan  (DELSYM ) 30 MG/5ML liquid 30 mg  30 mg Oral BID Johnson, Clanford L, MD       enoxaparin  (LOVENOX ) injection 40 mg  40 mg Subcutaneous Q24H Adefeso, Oladapo, DO   40 mg at 06/04/24 0956   famotidine  (PEPCID ) tablet 20 mg  20 mg Oral QHS Adefeso, Oladapo, DO   20 mg at 06/03/24 2139   feeding supplement (ENSURE PLUS HIGH PROTEIN) liquid 237 mL  237 mL Oral BID BM Adefeso, Oladapo, DO   237 mL at 06/04/24 1450   guaiFENesin  (MUCINEX ) 12 hr tablet 600 mg  600 mg Oral BID Johnson, Clanford L, MD       hydrALAZINE  (APRESOLINE ) injection 10 mg  10 mg Intravenous Q6H PRN Adefeso, Oladapo, DO       ipratropium-albuterol  (DUONEB) 0.5-2.5 (3) MG/3ML nebulizer solution 3 mL  3 mL Nebulization TID Vicci, Clanford L, MD   3 mL at 06/04/24 2024   mirtazapine  (REMERON ) tablet 15 mg  15 mg Oral QHS Johnson, Clanford L, MD   15 mg at 06/03/24 2139   ondansetron  (ZOFRAN ) tablet 4 mg  4 mg Oral Q6H PRN Adefeso, Oladapo, DO       Or   ondansetron  (ZOFRAN ) injection 4 mg  4 mg Intravenous Q6H PRN  Adefeso, Oladapo, DO       pantoprazole  (PROTONIX ) EC tablet 40 mg  40 mg Oral Daily Adefeso, Oladapo, DO   40 mg at 06/04/24 0956   sulfamethoxazole -trimethoprim  (BACTRIM  DS) 800-160 MG per tablet 1 tablet  1 tablet Oral Q12H Tanda Dempsey SAUNDERS, RPH   1 tablet at 06/04/24 9043   tamsulosin  (FLOMAX ) capsule 0.4 mg  0.4 mg Oral Daily Adefeso, Oladapo, DO   0.4 mg at 06/04/24 0956    REVIEW OF SYSTEMS:   Constitutional: Denies fevers, chills. + abnormal weight loss Eyes: Denies blurriness of vision Ears, nose, mouth, throat, and face: Denies mucositis or sore throat Respiratory: Denies cough, dyspnea or wheezes Cardiovascular: Denies palpitation, chest discomfort or lower extremity swelling Gastrointestinal:  Denies nausea, heartburn or change in bowel habits Skin: Denies abnormal skin rashes Lymphatics:  Denies new lymphadenopathy or easy bruising Neurological:Denies numbness, tingling. + weakness Behavioral/Psych: Mood is stable, no new changes   All other systems were reviewed with the patient and are negative.   VITALS:  There were no vitals taken for this visit.  Wt Readings from Last 3 Encounters:  06/02/24 140 lb (63.5 kg)  05/27/24 143 lb 15.4 oz (65.3 kg)  05/21/24 143 lb 15.4 oz (65.3 kg)    There is no height or weight on file to calculate BMI.  Performance status (ECOG): 2 - Symptomatic, <50% confined to bed  PHYSICAL EXAM:   GENERAL: Frail looking male in back pain EYES: normal, Conjunctiva are pink and non-injected, sclera clear LYMPH:  no palpable lymphadenopathy in the cervical, axillary or inguinal LUNGS: clear to auscultation and percussion with normal breathing effort HEART: regular rate & rhythm and no murmurs and no lower extremity edema ABDOMEN:abdomen distended, bowel sounds + Musculoskeletal:no cyanosis of digits and no clubbing, urinary bag attached to right leg  NEURO: alert & oriented x 3 with fluent speech  LABORATORY DATA:  I have reviewed the data  as listed     Component Value Date/Time   NA 136 06/04/2024 0450   NA 135 12/02/2020 1129   K 3.4 (L) 06/04/2024 0450   CL 103 06/04/2024 0450   CO2 24 06/04/2024 0450   GLUCOSE 97 06/04/2024 0450   BUN 19 06/04/2024 0450   BUN 20 12/02/2020 1129   CREATININE 0.76 06/04/2024 0450   CALCIUM  8.6 (L) 06/04/2024 0450   PROT 6.0 (L) 06/03/2024 0406   PROT 7.7 12/11/2020 1602   ALBUMIN 2.7 (L) 06/03/2024 0406   ALBUMIN 3.7 12/11/2020 1602   AST 32 06/03/2024 0406   ALT 25 06/03/2024 0406   ALKPHOS 158 (H) 06/03/2024 0406   BILITOT 0.4 06/03/2024 0406   BILITOT 1.6 (H) 12/11/2020 1602   GFRNONAA >60 06/04/2024 0450     Lab Results  Component Value Date   WBC 11.8 (H) 06/04/2024   NEUTROABS 10.0 (H) 06/04/2024   HGB 8.6 (L) 06/04/2024   HCT 28.0 (L) 06/04/2024   MCV 94.3 06/04/2024   PLT 267 06/04/2024      Chemistry      Component Value Date/Time   NA 136 06/04/2024 0450   NA 135 12/02/2020 1129   K 3.4 (L) 06/04/2024 0450   CL 103 06/04/2024 0450   CO2 24 06/04/2024 0450   BUN 19 06/04/2024 0450   BUN 20 12/02/2020 1129   CREATININE 0.76 06/04/2024 0450      Component Value Date/Time   CALCIUM  8.6 (L) 06/04/2024 0450   ALKPHOS 158 (H) 06/03/2024 0406   AST 32 06/03/2024 0406   ALT 25 06/03/2024 0406   BILITOT 0.4 06/03/2024 0406   BILITOT 1.6 (H) 12/11/2020 1602        RADIOGRAPHIC STUDIES: I have personally reviewed the radiological images as listed and agreed with the findings in the report.  DG Chest Port 1 View if patient is in a treatment room. CLINICAL DATA:  Cough, sepsis, short of breath  EXAM: PORTABLE CHEST 1 VIEW  COMPARISON:  05/21/2024  FINDINGS: Single frontal view of the chest demonstrates a stable cardiac silhouette. Lung volumes are diminished, with continued pulmonary vascular congestion. Interval development of patchy bilateral airspace disease greatest at the left lung base, which may reflect edema or infection. No effusion or  pneumothorax. No acute bony abnormalities.  IMPRESSION: 1. Low lung volumes, with vascular congestion and patchy bilateral airspace disease  left greater than right. Findings may reflect worsening edema or infection.  Electronically Signed   By: Ozell Daring M.D.   On: 06/02/2024 20:31

## 2024-06-04 NOTE — ED Provider Notes (Signed)
 [facility] Provider Note   CSN: [id number] Arrival date & time: 06/02/24  1954     Patient presents with: Cough   Dean Randolph is a 83 y.o. male.   Patient with hypertension and bladder cancer.  He was just discharged from the hospital.  Patient complains of cough and shortness of breath  The history is provided by the patient and medical records. No language interpreter was used.  Cough Cough characteristics:  Productive Sputum characteristics:  Nondescript Severity:  Moderate Onset quality:  Sudden Timing:  Constant Progression:  Worsening Chronicity:  New Smoker: no   Context: not animal exposure   Relieved by:  Nothing Associated symptoms: no chest pain, no eye discharge, no headaches and no rash        Prior to Admission medications   Medication Sig Start Date End Date Taking? Authorizing Provider  aspirin  EC 81 MG tablet Take 1 tablet (81 mg total) by mouth daily. Swallow whole. 06/02/24  Yes Ricky Fines, MD  atorvastatin  (LIPITOR) 20 MG tablet Take 1 tablet (20 mg total) by mouth every evening. 06/01/24  Yes Ricky Fines, MD  clopidogrel  (PLAVIX ) 75 MG tablet Take 1 tablet (75 mg total) by mouth daily. 06/02/24  Yes Ricky Fines, MD  famotidine  (PEPCID ) 20 MG tablet Take 1 tablet (20 mg total) by mouth at bedtime. 06/01/24  Yes Ricky Fines, MD  feeding supplement (ENSURE PLUS HIGH PROTEIN) LIQD Take 237 mLs by mouth 2 (two) times daily between meals. 06/01/24  Yes Ricky Fines, MD  guaiFENesin -dextromethorphan  (ROBITUSSIN DM) 100-10 MG/5ML syrup Take 5 mLs by mouth every 4 (four) hours as needed for cough. 06/01/24  Yes Ricky Fines, MD  HYDROcodone -acetaminophen  (NORCO/VICODIN) 5-325 MG tablet Take 2 tablets by mouth every 6 (six) hours as needed for severe pain (pain score 7-10). Patient taking differently: Take 1 tablet by mouth every 6 (six) hours as needed for severe pain (pain score 7-10). 04/12/24  Yes Davonna Siad, MD   loratadine  (CLARITIN ) 10 MG tablet Take 1 tablet (10 mg total) by mouth daily as needed for allergies (congestion). 06/01/24  Yes Ricky Fines, MD  mirtazapine  (REMERON ) 15 MG tablet Take 1 tablet (15 mg total) by mouth at bedtime. 05/15/24  Yes Davonna Siad, MD  ondansetron  (ZOFRAN ) 8 MG tablet Take 1 tablet (8 mg total) by mouth every 8 (eight) hours as needed for nausea or vomiting. 04/06/24  Yes Davonna Siad, MD  pantoprazole  (PROTONIX ) 40 MG tablet Take 1 tablet (40 mg total) by mouth daily. 06/02/24  Yes Ricky Fines, MD  tamsulosin  (FLOMAX ) 0.4 MG CAPS capsule Take 0.4 mg by mouth daily. 05/11/24  Yes [provider]    Allergies: Patient has no known allergies.    Review of Systems  Constitutional:  Negative for appetite change and fatigue.  HENT:  Negative for congestion, ear discharge and sinus pressure.   Eyes:  Negative for discharge.  Respiratory:  Positive for cough.   Cardiovascular:  Negative for chest pain.  Gastrointestinal:  Negative for abdominal pain and diarrhea.  Genitourinary:  Negative for frequency and hematuria.  Musculoskeletal:  Negative for back pain.  Skin:  Negative for rash.  Neurological:  Negative for seizures and headaches.  Psychiatric/Behavioral:  Negative for hallucinations.     Updated Vital Signs BP (!) 145/53   Pulse 98   Temp 98.4 F (36.9 C) (Oral)   Resp (!) 33   Ht 5' 7 (1.702 m)   Wt 63.5 kg  SpO2 93%   BMI 21.93 kg/m   Physical Exam Vitals and nursing note reviewed.  Constitutional:      Appearance: He is well-developed.  HENT:     Head: Normocephalic.     Nose: Nose normal.  Eyes:     General: No scleral icterus.    Conjunctiva/sclera: Conjunctivae normal.  Neck:     Thyroid : No thyromegaly.  Cardiovascular:     Rate and Rhythm: Normal rate and regular rhythm.     Heart sounds: No murmur heard.    No friction rub. No gallop.  Pulmonary:     Breath sounds: No stridor. No wheezing or rales.   Chest:     Chest wall: No tenderness.  Abdominal:     General: There is no distension.     Tenderness: There is no abdominal tenderness. There is no rebound.  Musculoskeletal:        General: Normal range of motion.     Cervical back: Neck supple.  Lymphadenopathy:     Cervical: No cervical adenopathy.  Skin:    Findings: No erythema or rash.  Neurological:     Mental Status: He is alert and oriented to person, place, and time.     Motor: No abnormal muscle tone.     Coordination: Coordination normal.  Psychiatric:        Behavior: Behavior normal.     (all labs ordered are listed, but only abnormal results are displayed) Labs Reviewed  URINE CULTURE - Abnormal; Notable for the following components:      Result Value   Culture   (*)    Value: >=100,000 COLONIES/mL GRAM NEGATIVE RODS CULTURE REINCUBATED FOR BETTER GROWTH Performed at [facility], [address] [place], [place] [address]    All other components within normal limits  COMPREHENSIVE METABOLIC PANEL WITH GFR - Abnormal; Notable for the following components:   Glucose, Bld 132 (*)    Albumin 3.3 (*)    AST 45 (*)    Alkaline Phosphatase 208 (*)    All other components within normal limits  LACTIC ACID, PLASMA - Abnormal; Notable for the following components:   Lactic Acid, Venous 3.3 (*)    All other components within normal limits  LACTIC ACID, PLASMA - Abnormal; Notable for the following components:   Lactic Acid, Venous 2.0 (*)    All other components within normal limits  CBC WITH DIFFERENTIAL/PLATELET - Abnormal; Notable for the following components:   WBC 14.1 (*)    RBC 3.43 (*)    Hemoglobin 10.4 (*)    HCT 33.3 (*)    RDW 22.3 (*)    Neutro Abs 12.7 (*)    Lymphs Abs 0.6 (*)    All other components within normal limits  PROTIME-INR - Abnormal; Notable for the following components:   Prothrombin Time 15.7 (*)    All other components within normal limits  URINALYSIS, W/ REFLEX TO  CULTURE (INFECTION SUSPECTED) - Abnormal; Notable for the following components:   APPearance CLOUDY (*)    Hgb urine dipstick MODERATE (*)    Protein, ur 100 (*)    Leukocytes,Ua LARGE (*)    Crystals PRESENT (*)    All other components within normal limits  COMPREHENSIVE METABOLIC PANEL WITH GFR - Abnormal; Notable for the following components:   Potassium 3.4 (*)    Glucose, Bld 108 (*)    Calcium  8.4 (*)    Total Protein 6.0 (*)    Albumin 2.7 (*)  Alkaline Phosphatase 158 (*)    All other components within normal limits  CBC - Abnormal; Notable for the following components:   WBC 14.4 (*)    RBC 2.89 (*)    Hemoglobin 8.7 (*)    HCT 27.6 (*)    RDW 22.6 (*)    All other components within normal limits  CBC WITH DIFFERENTIAL/PLATELET - Abnormal; Notable for the following components:   WBC 11.8 (*)    RBC 2.97 (*)    Hemoglobin 8.6 (*)    HCT 28.0 (*)    RDW 16.7 (*)    Neutro Abs 10.0 (*)    All other components within normal limits  BASIC METABOLIC PANEL WITH GFR - Abnormal; Notable for the following components:   Potassium 3.4 (*)    Calcium  8.6 (*)    All other components within normal limits  CULTURE, BLOOD (ROUTINE X 2)  CULTURE, BLOOD (ROUTINE X 2)  EXPECTORATED SPUTUM ASSESSMENT W GRAM STAIN, RFLX TO RESP C  PRO BRAIN NATRIURETIC PEPTIDE  STREP PNEUMONIAE URINARY ANTIGEN  PROCALCITONIN  MAGNESIUM  PHOSPHORUS  MAGNESIUM  LEGIONELLA PNEUMOPHILA SEROGP 1 UR AG    EKG: EKG Interpretation Date/Time:  Friday June 02 2024 20:04:44 EST Ventricular Rate:  140 PR Interval:  83 QRS Duration:  136 QT Interval:  392 QTC Calculation: 601 R Axis:   -19  Text Interpretation: Wide-QRS tachycardia Right bundle branch block Confirmed by Suzette Pac [id number]) on 06/02/2024 8:12:25 PM  Radiology: [facility] Chest Port 1 View if patient is in a treatment room. Result Date: 06/02/2024 CLINICAL DATA:  Cough, sepsis, short of breath EXAM: PORTABLE CHEST 1 VIEW COMPARISON:   05/21/2024 FINDINGS: Single frontal view of the chest demonstrates a stable cardiac silhouette. Lung volumes are diminished, with continued pulmonary vascular congestion. Interval development of patchy bilateral airspace disease greatest at the left lung base, which may reflect edema or infection. No effusion or pneumothorax. No acute bony abnormalities. IMPRESSION: 1. Low lung volumes, with vascular congestion and patchy bilateral airspace disease left greater than right. Findings may reflect worsening edema or infection. Electronically Signed   By: Ozell Daring M.D.   On: 06/02/2024 20:31     Procedures   Medications Ordered in the ED  Chlorhexidine  Gluconate Cloth 2 % PADS 6 each (6 each Topical Given 06/04/24 0612)  dextromethorphan -guaiFENesin  (MUCINEX  DM) 30-600 MG per 12 hr tablet 1 tablet (1 tablet Oral Given 06/04/24 0956)  hydrALAZINE  (APRESOLINE ) injection 10 mg (has no administration in time range)  aspirin  EC tablet 81 mg (81 mg Oral Given 06/04/24 0956)  atorvastatin  (LIPITOR) tablet 20 mg (20 mg Oral Given 06/03/24 1655)  famotidine  (PEPCID ) tablet 20 mg (20 mg Oral Given 06/03/24 2139)  pantoprazole  (PROTONIX ) EC tablet 40 mg (40 mg Oral Given 06/04/24 0956)  tamsulosin  (FLOMAX ) capsule 0.4 mg (0.4 mg Oral Given 06/04/24 0956)  clopidogrel  (PLAVIX ) tablet 75 mg (75 mg Oral Given 06/04/24 0956)  feeding supplement (ENSURE PLUS HIGH PROTEIN) liquid 237 mL (237 mLs Oral Given 06/03/24 1412)  lactated ringers  infusion ( Intravenous Stopped 06/03/24 0859)  enoxaparin  (LOVENOX ) injection 40 mg (40 mg Subcutaneous Given 06/04/24 0956)  acetaminophen  (TYLENOL ) tablet 650 mg (has no administration in time range)    Or  acetaminophen  (TYLENOL ) suppository 650 mg (has no administration in time range)  ondansetron  (ZOFRAN ) tablet 4 mg (has no administration in time range)    Or  ondansetron  (ZOFRAN ) injection 4 mg (has no administration in time range)  sulfamethoxazole -trimethoprim  (BACTRIM  DS)  800-160 MG per tablet 1 tablet (1 tablet Oral Given 06/04/24 0956)  lactated ringers  infusion ( Intravenous Infusion Verify 06/03/24 1816)  mirtazapine  (REMERON ) tablet 15 mg (15 mg Oral Given 06/03/24 2139)  sodium chloride  0.9 % bolus 1,000 mL (0 mLs Intravenous Stopped 06/02/24 2243)  vancomycin  (VANCOCIN ) IVPB 1000 mg/200 mL premix (0 mg Intravenous Stopped 06/02/24 2243)  ceFEPIme  (MAXIPIME ) 2 g in sodium chloride  0.9 % 100 mL IVPB (0 g Intravenous Stopped 06/02/24 2243)  potassium chloride  10 mEq in 100 mL IVPB (0 mEq Intravenous Stopped 06/03/24 1509)   CRITICAL CARE Performed by: Fairy Sermon Total critical care time: 45 minutes Critical care time was exclusive of separately billable procedures and treating other patients. Critical care was necessary to treat or prevent imminent or life-threatening deterioration. Critical care was time spent personally by me on the following activities: development of treatment plan with patient and/or surrogate as well as nursing, discussions with consultants, evaluation of patient's response to treatment, examination of patient, obtaining history from patient or surrogate, ordering and performing treatments and interventions, ordering and review of laboratory studies, ordering and review of radiographic studies, pulse oximetry and re-evaluation of patient's condition.                                  Medical Decision Making Amount and/or Complexity of Data Reviewed Labs: ordered. Radiology: ordered.  Risk Prescription drug management. Decision regarding hospitalization.   Patient with bilateral pneumonia.  He will be started on antibiotics and admitted to medicine     Final diagnoses:  Community acquired pneumonia, unspecified laterality    ED Discharge Orders     None          Sermon Fairy, MD 06/04/24 [phone]

## 2024-06-04 NOTE — Plan of Care (Signed)
  Problem: Safety: Goal: Ability to remain free from injury will improve 06/04/2024 1449 by Elner Ames MATSU, RN Outcome: Progressing 06/04/2024 1448 by Elner Ames MATSU, RN Outcome: Progressing   Problem: Education: Goal: Knowledge of General Education information will improve Description: Including pain rating scale, medication(s)/side effects and non-pharmacologic comfort measures 06/04/2024 1449 by Elner Ames MATSU, RN Outcome: Progressing 06/04/2024 1448 by Elner Ames MATSU, RN Outcome: Progressing   Problem: Health Behavior/Discharge Planning: Goal: Ability to manage health-related needs will improve 06/04/2024 1449 by Elner Ames MATSU, RN Outcome: Progressing 06/04/2024 1448 by Elner Ames MATSU, RN Outcome: Progressing   Problem: Clinical Measurements: Goal: Ability to maintain clinical measurements within normal limits will improve 06/04/2024 1449 by Elner Ames MATSU, RN Outcome: Progressing 06/04/2024 1448 by Elner Ames MATSU, RN Outcome: Progressing Goal: Will remain free from infection 06/04/2024 1449 by Elner Ames MATSU, RN Outcome: Progressing 06/04/2024 1448 by Elner Ames MATSU, RN Outcome: Progressing Goal: Diagnostic test results will improve 06/04/2024 1449 by Elner Ames MATSU, RN Outcome: Progressing 06/04/2024 1448 by Elner Ames MATSU, RN Outcome: Progressing Goal: Respiratory complications will improve 06/04/2024 1449 by Elner Ames MATSU, RN Outcome: Progressing 06/04/2024 1448 by Elner Ames MATSU, RN Outcome: Progressing Goal: Cardiovascular complication will be avoided 06/04/2024 1449 by Elner Ames MATSU, RN Outcome: Progressing 06/04/2024 1448 by Elner Ames MATSU, RN Outcome: Progressing   Problem: Activity: Goal: Risk for activity intolerance will decrease 06/04/2024 1449 by Elner Ames MATSU, RN Outcome: Progressing 06/04/2024 1448 by Elner Ames MATSU, RN Outcome: Progressing   Problem: Nutrition: Goal: Adequate nutrition will be  maintained 06/04/2024 1449 by Elner Ames MATSU, RN Outcome: Progressing 06/04/2024 1448 by Elner Ames MATSU, RN Outcome: Progressing   Problem: Coping: Goal: Level of anxiety will decrease 06/04/2024 1449 by Elner Ames MATSU, RN Outcome: Progressing 06/04/2024 1448 by Elner Ames MATSU, RN Outcome: Progressing   Problem: Elimination: Goal: Will not experience complications related to bowel motility 06/04/2024 1449 by Elner Ames MATSU, RN Outcome: Progressing 06/04/2024 1448 by Elner Ames MATSU, RN Outcome: Progressing Goal: Will not experience complications related to urinary retention 06/04/2024 1449 by Elner Ames MATSU, RN Outcome: Progressing 06/04/2024 1448 by Elner Ames MATSU, RN Outcome: Progressing   Problem: Pain Managment: Goal: General experience of comfort will improve and/or be controlled 06/04/2024 1449 by Elner Ames MATSU, RN Outcome: Progressing 06/04/2024 1448 by Elner Ames MATSU, RN Outcome: Progressing   Problem: Safety: Goal: Ability to remain free from injury will improve 06/04/2024 1449 by Elner Ames MATSU, RN Outcome: Progressing 06/04/2024 1448 by Elner Ames MATSU, RN Outcome: Progressing   Problem: Skin Integrity: Goal: Risk for impaired skin integrity will decrease 06/04/2024 1449 by Elner Ames MATSU, RN Outcome: Progressing 06/04/2024 1448 by Elner Ames MATSU, RN Outcome: Progressing   Problem: Activity: Goal: Ability to tolerate increased activity will improve 06/04/2024 1449 by Elner Ames MATSU, RN Outcome: Progressing 06/04/2024 1448 by Elner Ames MATSU, RN Outcome: Progressing   Problem: Clinical Measurements: Goal: Ability to maintain a body temperature in the normal range will improve 06/04/2024 1449 by Elner Ames MATSU, RN Outcome: Progressing 06/04/2024 1448 by Elner Ames MATSU, RN Outcome: Progressing   Problem: Respiratory: Goal: Ability to maintain adequate ventilation will improve 06/04/2024 1449 by Elner Ames MATSU,  RN Outcome: Progressing 06/04/2024 1448 by Elner Ames MATSU, RN Outcome: Progressing Goal: Ability to maintain a clear airway will improve 06/04/2024 1449 by Elner Ames MATSU, RN Outcome: Progressing 06/04/2024 1448 by Elner Ames MATSU, RN Outcome: Progressing

## 2024-06-04 NOTE — Evaluation (Signed)
 Clinical/Bedside Swallow Evaluation Patient Details  Name: LESSLIE MCKEEHAN MRN: [id number] Date of Birth: 10-17-1940  Today's Date: 06/04/2024 Time: SLP Start Time (ACUTE ONLY): 1456 SLP Stop Time (ACUTE ONLY): 1521 SLP Time Calculation (min) (ACUTE ONLY): 25 min  Past Medical History:  Past Medical History:  Diagnosis Date   Bladder cancer (HCC)    Hypertension    Past Surgical History:  Past Surgical History:  Procedure Laterality Date   BILIARY DILATION  03/12/2022   Procedure: BILIARY DILATION;  Surgeon: Wilhelmenia Aloha Raddle., MD;  Location: [facility];  Service: Gastroenterology;;   BILIARY STENT PLACEMENT N/A 02/06/2021   Procedure: BILIARY STENT PLACEMENT;  Surgeon: Golda Claudis PENNER, MD;  Location: [facility];  Service: Gastroenterology;  Laterality: N/A;   BILIARY STENT PLACEMENT N/A 03/26/2021   Procedure: BILIARY STENT PLACEMENT 10 FRENCH BY 9cm;  Surgeon: Golda Claudis PENNER, MD;  Location: [facility];  Service: Endoscopy;  Laterality: N/A;   BILIARY STENT PLACEMENT N/A 01/19/2022   Procedure: BILIARY STENT PLACEMENT;  Surgeon: Wilhelmenia Aloha Raddle., MD;  Location: [facility];  Service: Gastroenterology;  Laterality: N/A;   BIOPSY  01/19/2022   Procedure: BIOPSY;  Surgeon: Wilhelmenia Aloha Raddle., MD;  Location: [facility];  Service: Gastroenterology;;   CATARACT EXTRACTION Bilateral    CHOLECYSTECTOMY N/A 04/25/2021   Procedure: LAPAROSCOPIC CHOLECYSTECTOMY;  Surgeon: Mavis Anes, MD;  Location: [facility];  Service: General;  Laterality: N/A;   ENDOSCOPIC RETROGRADE CHOLANGIOPANCREATOGRAPHY (ERCP) WITH PROPOFOL  N/A 01/19/2022   Procedure: ENDOSCOPIC RETROGRADE CHOLANGIOPANCREATOGRAPHY (ERCP) WITH PROPOFOL ;  Surgeon: Wilhelmenia Aloha Raddle., MD;  [facility];  Service: Gastroenterology;  Laterality: N/A;   ENDOSCOPIC RETROGRADE CHOLANGIOPANCREATOGRAPHY (ERCP) WITH PROPOFOL  N/A 03/12/2022   Procedure: ENDOSCOPIC RETROGRADE CHOLANGIOPANCREATOGRAPHY (ERCP) WITH  PROPOFOL ;  Surgeon: Wilhelmenia Aloha Raddle., MD;  [facility];  Service: Gastroenterology;  Laterality: N/A;   ERCP N/A 02/06/2021   Procedure: ENDOSCOPIC RETROGRADE CHOLANGIOPANCREATOGRAPHY (ERCP);  Surgeon: Golda Claudis PENNER, MD;  [facility]: [facility];  Service: Gastroenterology;  Laterality: N/A;   ERCP N/A 03/26/2021   Procedure: ENDOSCOPIC RETROGRADE CHOLANGIOPANCREATOGRAPHY (ERCP) with EXTENDED SPHINCTEROTOMY;  Surgeon: Golda Claudis PENNER, MD;  [facility]: [facility];  Service: Endoscopy;  Laterality: N/A;   ERCP N/A 10/27/2021   Procedure: ENDOSCOPIC RETROGRADE CHOLANGIOPANCREATOGRAPHY (ERCP);  Surgeon: Golda Claudis PENNER, MD;  [facility]: [facility];  Service: Endoscopy;  Laterality: N/A;  no site    GASTROINTESTINAL STENT REMOVAL N/A 03/26/2021   Procedure: GASTROINTESTINAL STENT REMOVAL;  Surgeon: Golda Claudis PENNER, MD;  [facility]: [facility];  Service: Endoscopy;  Laterality: N/A;   GASTROINTESTINAL STENT REMOVAL N/A 10/27/2021   Procedure: GASTROINTESTINAL STENT EXCHANGE;  Surgeon: Golda Claudis PENNER, MD;  [facility]: [facility];  Service: Endoscopy;  Laterality: N/A;  no site    REMOVAL OF STONES N/A 03/26/2021   Procedure: REMOVAL OF STONES WITH EXTRACTION BASKET;  Surgeon: Golda Claudis PENNER, MD;  [facility]: [facility];  Service: Endoscopy;  Laterality: N/A;   REMOVAL OF STONES N/A 10/27/2021   Procedure: REMOVAL OF STONES;  Surgeon: Golda Claudis PENNER, MD;  [facility];  Service: Endoscopy;  Laterality: N/A;   REMOVAL OF STONES  01/19/2022   Procedure: REMOVAL OF STONES;  Surgeon: Wilhelmenia Aloha Raddle., MD;  Location: [facility];  Service: Gastroenterology;;   REMOVAL OF STONES  03/12/2022   Procedure: REMOVAL OF STONES;  Surgeon: Wilhelmenia Aloha Raddle., MD;  Location: [facility];  Service: Gastroenterology;;   ANNETT N/A 02/06/2021   Procedure: ANNETT;  Surgeon: Golda Claudis PENNER, MD;  [facility]: [facility];  Service: Gastroenterology;  Laterality: N/A;   SPHINCTEROTOMY N/A 10/27/2021    Procedure: SPHINCTEROTOMY;  Surgeon: Golda Claudis PENNER, MD;  [facility]: [facility];  Service: Endoscopy;  Laterality: N/A;   SPYGLASS CHOLANGIOSCOPY N/A 01/19/2022   Procedure: SPYGLASS CHOLANGIOSCOPY;  Surgeon: Wilhelmenia Aloha Raddle., MD;  [facility];  Service: Gastroenterology;  Laterality: N/A;   SPYGLASS CHOLANGIOSCOPY N/A 03/12/2022   Procedure: DEBHOJDD CHOLANGIOSCOPY;  Surgeon: Wilhelmenia Aloha Raddle., MD;  [facility];  Service: Gastroenterology;  Laterality: N/A;   SPYGLASS LITHOTRIPSY N/A 03/12/2022   Procedure: DEBHOJDD LITHOTRIPSY;  Surgeon: Wilhelmenia Aloha Raddle., MD;  Location: [facility];  Service: Gastroenterology;  Laterality: N/A;   STENT REMOVAL  01/19/2022   Procedure: STENT REMOVAL;  Surgeon: Wilhelmenia Aloha Raddle., MD;  Location: [facility];  Service: Gastroenterology;;   CLEDA REMOVAL  03/12/2022   Procedure: STENT REMOVAL;  Surgeon: Wilhelmenia Aloha Raddle., MD;  Location: [facility];  Service: Gastroenterology;;   STONE EXTRACTION WITH BASKET  01/19/2022   Procedure: STONE EXTRACTION WITH BASKET;  Surgeon: Wilhelmenia Aloha Raddle., MD;  [facility];  Service: Gastroenterology;;   HPI:  83 y.o. 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  Patient recently treated for UTI 6 days ago and presents with worsening confusion.  His confusion was worse over the last 24 hours.  He has also had decreased appetite and oral intake.  No fevers, chills, nausea, vomiting. MRI shows new strokes. MRI positive for small acute to early subacute bilateral occipital infarcts    Assessment / Plan / Recommendation  Clinical Impression  Pt presents with seeminly intact oropharyngeal swallow function, as best determined at bedside. However, pt with high aspiration risk 2/2 current respiratory status and reoccuring PNA. Pt with single immediate cough with thin  liquids, otherwise no overt s/sx aspiration, reports of globus or residue with thin liquids or puree. He has congested cough at baseline, demonstrating expectoration of phlegm thoughout evaluation. Vocal quality is notable for low volume, speech is c/b imprecise articulation. OME within gross normal limits. Recommend ongoing thin liquids, dys 2 solids with strict aspiration precautions. Recommend instrumental swallow study next date to ensure silent aspiration not contributing to PNA, to which pt agrees. SLP Visit Diagnosis: Dysphagia, unspecified (R13.10)    Aspiration Risk  Moderate aspiration risk    Diet Recommendation    Dys 2; thin liquids       Other Recommendations Oral Care Recommendations: Oral care QID     Swallow Evaluation Recommendations Recommendations: PO diet PO Diet Recommendation: Dysphagia 2 (Finely chopped);Thin liquids (Level 0) Medication Administration: Crushed with puree Supervision: Patient able to self-feed Swallowing strategies  : Minimize environmental distractions;Small bites/sips Postural changes: Position pt fully upright for meals;Stay upright 30-60 min after meals Oral care recommendations: Oral care QID (4x/day)   Assistance Recommended at Discharge    Functional Status Assessment Patient has had a recent decline in their functional status and demonstrates the ability to make significant improvements in function in a reasonable and predictable amount of time.  Frequency and Duration min 1 x/week  2 weeks       Prognosis Prognosis for improved oropharyngeal function: Good      Swallow Study   General HPI: 83 y.o. 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  Patient recently treated for UTI 6 days ago and presents with worsening confusion.  His confusion was worse over the last 24 hours.  He has also had decreased appetite and oral intake.  No  fevers, chills, nausea, vomiting. MRI shows new strokes. MRI positive for small acute to early subacute bilateral occipital infarcts Type of Study: Bedside Swallow Evaluation Diet Prior to this Study: Dysphagia 3 (mechanical soft);Thin liquids (Level 0) Temperature Spikes Noted: No Respiratory Status: Room air History of Recent Intubation: No Behavior/Cognition: Alert;Cooperative Oral Cavity Assessment: Dry Oral Care Completed by SLP: Yes Oral Cavity - Dentition: Missing dentition Vision: Functional for self-feeding Self-Feeding Abilities: Able to feed self Patient Positioning: Upright in bed Baseline Vocal Quality: Low vocal intensity Volitional Cough: Congested Volitional Swallow: Able to elicit    Oral/Motor/Sensory Function Overall Oral Motor/Sensory Function: Within functional limits   Ice Chips     Thin Liquid Thin Liquid: Within functional limits Presentation: Straw    Nectar Thick Nectar Thick Liquid: Not tested   Honey Thick Honey Thick Liquid: Not tested   Puree Puree: Within functional limits   Solid     Solid: Not tested      Harlene LITTIE Ned 06/04/2024,3:57 PM

## 2024-06-04 NOTE — Progress Notes (Signed)
 PROGRESS NOTE   Dean Randolph  FMW:[id number] DOB: March 11, 1941 DOA: 06/02/2024 PCP: Davonna Siad, MD   Chief Complaint  Patient presents with   Cough   Level of care: Telemetry  Brief Admission History:  83 y.o. male with medical history significant of hypertension, history of bladder cancer with metastatic disease to lumbar and thoracic spine who was recently admitted from 11/29 to 12/4 due to acute metabolic encephalopathy due to small acute to early subacute bilateral occipital infarcts.  Daughter at bedside states that patient has had a mild cough prior to being discharged, this worsened (a day after being discharged) after eating a meal at home, cough was productive with yellowish-green sputum and it was associated with shortness of breath when he has a coughing fit.   ED course In the emergency department, patient was tachypneic, tachycardic, temperature was 97.5 F, O2 sat was 95 to 98% on 3 LPM of oxygen (patient does not use oxygen at baseline).  Workup in the ED showed normocytic anemia and leukocytosis.  BMP was normal except for blood glucose of 132 lactic acid 3.3 > 2.0.  Urinalysis was suspicious for UTI.  Urine culture was positive for Stenotrophomonas maltophilia.  Chest x-ray was suggestive of worsening edema or infection.  EKG personally reviewed which showed wide QRS tachycardia heart rate of 148 bpm with RBBB.  Patient was treated with IV vancomycin  and cefepime , IV hydration.    Assessment and Plan:  Aspiration pneumonia  -- sepsis physiology resolved  -- continue aspiration precautions -- SLP evaluation with plan for MBS -- dysphagia diet  -- aspiration precautions -- adding chest PT and duonebs to help clear secretions if possible -- concerned about ongoing silent aspiration   Lactic acidosis  -- resolved with treatments  Stenotrophomonas maltophilia UTI  -- agree with Bactrim  DS with plan to treat for 7 days given complicated UTI picture  Essential  hypertension  --- BPs stable, follow  Urothelial carcinoma -- metastasis to bone and lymph nodes -- he hasn't been able to get his palliative radiation due to being hospitalized -- he is on chemo treatments per oncology   GERD  -- pantoprazole  for GI protection   Urinary retention  -- foley cath was changed on admission -- tamsulosin  daily ordered  Protein calorie malnutrition  -- agree with meal supplementation as ordered   Goal of care -- long discussion with family on 12/7 about patient's precipitous decline in last months especially after recent CVA.  I expressed that if he is not eating or drinking better in next few days I would recommend transition to full comfort measures for comfort and dignity.  Wife expressed she did not want patient to feel pain or suffer.  Will ask for palliative consultation.   DVT prophylaxis: enoxaparin  Code Status: Full  Family Communication: long meeting 12/7 Disposition: TBD   Consultants:  Palliative  Procedures:   Antimicrobials:  Bactrim  DS 12/6>>   Subjective: Pt having a lot of cough and chest congestion, some productive cough  Objective: Vitals:   06/04/24 1122 06/04/24 1207 06/04/24 1257 06/04/24 1703  BP:   110/65 123/63  Pulse:  (!) 117 (!) 107 99  Resp:  (!) 21 20 19   Temp: 98.1 F (36.7 C) 98.2 F (36.8 C) 98 F (36.7 C) 97.8 F (36.6 C)  TempSrc: Oral Oral Oral Oral  SpO2:  96% 92% 92%  Weight:      Height:        Intake/Output Summary (Last 24 hours)  at 06/04/2024 1715 Last data filed at 06/04/2024 0956 Gross per 24 hour  Intake 1379.57 ml  Output 850 ml  Net 529.57 ml   Filed Weights   06/02/24 2005  Weight: 63.5 kg   Examination:  General exam: Appears calm and comfortable  Respiratory system: Clear to auscultation. Respiratory effort normal. Cardiovascular system: normal S1 & S2 heard. No JVD, murmurs, rubs, gallops or clicks. No pedal edema. Gastrointestinal system: Abdomen is nondistended, soft  and nontender. No organomegaly or masses felt. Normal bowel sounds heard. Central nervous system: Alert and oriented. No focal neurological deficits. Extremities: Symmetric 5 x 5 power. Skin: No rashes, lesions or ulcers. Psychiatry: Judgement and insight appear normal. Mood & affect appropriate.   Data Reviewed: I have personally reviewed following labs and imaging studies  CBC: Recent Labs  Lab 05/29/24 0701 06/02/24 2022 06/03/24 0406 06/04/24 0450  WBC 8.4 14.1* 14.4* 11.8*  NEUTROABS  --  12.7*  --  10.0*  HGB 9.2* 10.4* 8.7* 8.6*  HCT 28.7* 33.3* 27.6* 28.0*  MCV 97.3 97.1 95.5 94.3  PLT 308 394 301 267    Basic Metabolic Panel: Recent Labs  Lab 05/29/24 0355 06/01/24 0823 06/02/24 2022 06/03/24 0406 06/04/24 0450  NA 137 134* 136 137 136  K 3.5 3.4* 3.9 3.4* 3.4*  CL 103 100 98 103 103  CO2 23 25 26 25 24   GLUCOSE 86 101* 132* 108* 97  BUN 18 24* 22 22 19   CREATININE 0.76 0.81 0.93 0.81 0.76  CALCIUM  8.7* 8.6* 9.2 8.4* 8.6*  MG  --   --   --  2.0 1.9  PHOS  --   --   --  3.3  --     CBG: No results for input(s): GLUCAP in the last 168 hours.  Recent Results (from the past 240 hours)  Urine Culture     Status: Abnormal   Collection Time: 05/27/24 12:36 PM   Specimen: Urine, Random  Result Value Ref Range Status   Specimen Description   Final    URINE, RANDOM Performed at [facility], [address], [place], [place] [address]    Special Requests   Final    NONE Reflexed from [id number] Performed at [facility], [address], [place], [place] [address]    Culture (A)  Final    20,000 COLONIES/mL STENOTROPHOMONAS MALTOPHILIA Sent to [facility] for further susceptibility testing. SEE SEPARATE REPORT Performed at [facility], [address], [place], [place] [address]    Report Status 06/02/2024 FINAL  Final  Susceptibility, Aer + Anaerob     Status: Abnormal   Collection Time: 05/27/24 12:36 PM  Result Value Ref Range Status   Suscept, Aer +  Anaerob Final report (A)  Corrected    Comment: (NOTE) Performed At: [facility] [address] [place], [place] [id number] Jennette Shorter MD Ey:[id number] CORRECTED ON 12/05 AT 1535: PREVIOUSLY REPORTED AS Preliminary report    Source STENOTROPHOMONAS MALTOPHILIA URINE CULTURE  Final    Comment: Performed at [facility], [address] [place], [place] [address]  Susceptibility Result     Status: Abnormal   Collection Time: 05/27/24 12:36 PM  Result Value Ref Range Status   Suscept Result 1 Comment (A)  Final    Comment: (NOTE) Stenotrophomonas maltophilia Identification performed by account, not confirmed by this laboratory. Testing performed by broth microdilution.    Antimicrobial Suscept Comment  Corrected    Comment: (NOTE)      **  S = Susceptible; I = Intermediate; R = Resistant **                   P = Positive; N = Negative            MICS are expressed in micrograms per mL   Antibiotic                 RSLT#1    RSLT#2    RSLT#3    RSLT#4 Ceftazidime                    S Levofloxacin                    S Minocycline                    S Trimethoprim /Sulfa              S Performed At: [facility] [address] [place], [place] [id number] Jennette Shorter MD Ey:[id number]   MRSA Next Gen by PCR, Nasal     Status: None   Collection Time: 05/27/24  6:15 PM   Specimen: Nasal Mucosa; Nasal Swab  Result Value Ref Range Status   MRSA by PCR Next Gen NOT DETECTED NOT DETECTED Final    Comment: (NOTE) The GeneXpert MRSA Assay (FDA approved for NASAL specimens only), is one component of a comprehensive MRSA colonization surveillance program. It is not intended to diagnose MRSA infection nor to guide or monitor treatment for MRSA infections. Test performance is not FDA approved in patients less than 56 years old. Performed at [facility], [address], [place], [place] [address]   Culture, blood (Routine x 2)     Status: None (Preliminary result)    Collection Time: 06/02/24  8:22 PM   Specimen: Right Antecubital; Blood  Result Value Ref Range Status   Specimen Description RIGHT ANTECUBITAL  Final   Special Requests   Final    BOTTLES DRAWN AEROBIC AND ANAEROBIC Blood Culture adequate volume   Culture   Final    NO GROWTH 2 DAYS Performed at [facility], [address] [place], [place] [address]    Report Status PENDING  Incomplete  Urine Culture     Status: Abnormal (Preliminary result)   Collection Time: 06/02/24  8:22 PM   Specimen: Urine, Random  Result Value Ref Range Status   Specimen Description   Final    URINE, RANDOM Performed at [facility], [address], [place], [place] [address]    Special Requests   Final    NONE Reflexed from [id number] Performed at [facility], [address], [place], [place] [address]    Culture >=100,000 COLONIES/mL STENOTROPHOMONAS MALTOPHILIA (A)  Final   Report Status PENDING  Incomplete  Culture, blood (Routine x 2)     Status: None (Preliminary result)   Collection Time: 06/02/24  8:42 PM   Specimen: BLOOD RIGHT ARM  Result Value Ref Range Status   Specimen Description BLOOD RIGHT ARM  Final   Special Requests   Final    BOTTLES DRAWN AEROBIC AND ANAEROBIC Blood Culture adequate volume   Culture   Final    NO GROWTH 2 DAYS Performed at [facility], [address] [place], [place] [address]    Report Status PENDING  Incomplete     Radiology Studies: DG Chest Port 1 View if patient is in a treatment room. Result Date: 06/02/2024 CLINICAL DATA:  Cough, sepsis,  short of breath EXAM: PORTABLE CHEST 1 VIEW COMPARISON:  05/21/2024 FINDINGS: Single frontal view of the chest demonstrates a stable cardiac silhouette. Lung volumes are diminished, with continued pulmonary vascular congestion. Interval development of patchy bilateral airspace disease greatest at the left lung base, which may reflect edema or infection. No effusion or pneumothorax. No acute bony abnormalities. IMPRESSION: 1.  Low lung volumes, with vascular congestion and patchy bilateral airspace disease left greater than right. Findings may reflect worsening edema or infection. Electronically Signed   By: Ozell Daring M.D.   On: 06/02/2024 20:31    Scheduled Meds:  aspirin  EC  81 mg Oral Daily   atorvastatin   20 mg Oral QPM   Chlorhexidine  Gluconate Cloth  6 each Topical Q0600   clopidogrel   75 mg Oral Daily   dextromethorphan   30 mg Oral BID   enoxaparin  (LOVENOX ) injection  40 mg Subcutaneous Q24H   famotidine   20 mg Oral QHS   feeding supplement  237 mL Oral BID BM   guaiFENesin   600 mg Oral BID   ipratropium-albuterol   3 mL Nebulization TID   mirtazapine   15 mg Oral QHS   pantoprazole   40 mg Oral Daily   sulfamethoxazole -trimethoprim   1 tablet Oral Q12H   tamsulosin   0.4 mg Oral Daily   Continuous Infusions:   LOS: 2 days   Time spent: 55 mins  Lester Crickenberger Vicci, MD How to contact the [facility] Attending or Consulting provider 7A - 7P or covering provider during after hours 7P -7A, for this patient?  Check the care team in [facility] and look for a) attending/consulting [facility] provider listed and b) the [facility] team listed Log into [email or web address] to find provider on call.  Locate the [facility] provider you are looking for under [facility] and page to a number that you can be directly reached. If you still have difficulty reaching the provider, please page the [facility] (Director on Call) for the Hospitalists listed on amion for assistance.  06/04/2024, 5:15 PM

## 2024-06-05 ENCOUNTER — Inpatient Hospital Stay: Admitting: Dietician

## 2024-06-05 ENCOUNTER — Inpatient Hospital Stay

## 2024-06-05 ENCOUNTER — Inpatient Hospital Stay (HOSPITAL_COMMUNITY)

## 2024-06-05 ENCOUNTER — Inpatient Hospital Stay: Admitting: Hematology and Oncology

## 2024-06-05 LAB — CBC WITH DIFFERENTIAL/PLATELET
Abs Immature Granulocytes: 0.05 K/uL (ref 0.00–0.07)
Basophils Absolute: 0.1 K/uL (ref 0.0–0.1)
Basophils Relative: 1 %
Eosinophils Absolute: 0.3 K/uL (ref 0.0–0.5)
Eosinophils Relative: 3 %
HCT: 26 % — ABNORMAL LOW (ref 39.0–52.0)
Hemoglobin: 8.3 g/dL — ABNORMAL LOW (ref 13.0–17.0)
Immature Granulocytes: 1 %
Lymphocytes Relative: 9 %
Lymphs Abs: 0.8 K/uL (ref 0.7–4.0)
MCH: 30.3 pg (ref 26.0–34.0)
MCHC: 31.9 g/dL (ref 30.0–36.0)
MCV: 94.9 fL (ref 80.0–100.0)
Monocytes Absolute: 0.7 K/uL (ref 0.1–1.0)
Monocytes Relative: 8 %
Neutro Abs: 7.2 K/uL (ref 1.7–7.7)
Neutrophils Relative %: 78 %
Platelets: 256 K/uL (ref 150–400)
RBC: 2.74 MIL/uL — ABNORMAL LOW (ref 4.22–5.81)
RDW: 17 % — ABNORMAL HIGH (ref 11.5–15.5)
WBC: 9 K/uL (ref 4.0–10.5)
nRBC: 0 % (ref 0.0–0.2)

## 2024-06-05 LAB — BASIC METABOLIC PANEL WITH GFR
Anion gap: 13 (ref 5–15)
BUN: 18 mg/dL (ref 8–23)
CO2: 19 mmol/L — ABNORMAL LOW (ref 22–32)
Calcium: 8.5 mg/dL — ABNORMAL LOW (ref 8.9–10.3)
Chloride: 103 mmol/L (ref 98–111)
Creatinine, Ser: 0.78 mg/dL (ref 0.61–1.24)
GFR, Estimated: >60 mL/min (ref >60)
Glucose, Bld: 95 mg/dL (ref 70–99)
Potassium: 3.3 mmol/L — ABNORMAL LOW (ref 3.5–5.1)
Sodium: 135 mmol/L (ref 135–145)

## 2024-06-05 LAB — LEGIONELLA PNEUMOPHILA SEROGP 1 UR AG: L. pneumophila Serogp 1 Ur Ag: NEGATIVE

## 2024-06-05 MED ORDER — POTASSIUM CHLORIDE 10 MEQ/100ML IV SOLN
10.0000 meq | INTRAVENOUS | Status: AC
  Administered 2024-06-05 (×3): 10 meq via INTRAVENOUS
  Filled 2024-06-05: qty 100

## 2024-06-05 MED ORDER — GUAIFENESIN 100 MG/5ML PO LIQD
10.0000 mL | Freq: Three times a day (TID) | ORAL | Status: DC
  Administered 2024-06-05 – 2024-06-07 (×3): 10 mL via ORAL
  Filled 2024-06-05 (×4): qty 10

## 2024-06-05 NOTE — Progress Notes (Signed)
 PROGRESS NOTE   Dean Randolph  FMW:[id number] DOB: 11-03-1940 DOA: 06/02/2024 PCP: Davonna Siad, MD   Chief Complaint  Patient presents with   Cough   Level of care: Telemetry  Brief Admission History:  83 y.o. male with medical history significant of hypertension, history of bladder cancer with metastatic disease to lumbar and thoracic spine who was recently admitted from 11/29 to 12/4 due to acute metabolic encephalopathy due to small acute to early subacute bilateral occipital infarcts.  Daughter at bedside states that patient has had a mild cough prior to being discharged, this worsened (a day after being discharged) after eating a meal at home, cough was productive with yellowish-green sputum and it was associated with shortness of breath when he has a coughing fit.   ED course In the emergency department, patient was tachypneic, tachycardic, temperature was 97.5 F, O2 sat was 95 to 98% on 3 LPM of oxygen (patient does not use oxygen at baseline).  Workup in the ED showed normocytic anemia and leukocytosis.  BMP was normal except for blood glucose of 132 lactic acid 3.3 > 2.0.  Urinalysis was suspicious for UTI.  Urine culture was positive for Stenotrophomonas maltophilia.  Chest x-ray was suggestive of worsening edema or infection.  EKG personally reviewed which showed wide QRS tachycardia heart rate of 148 bpm with RBBB.  Patient was treated with IV vancomycin  and cefepime , IV hydration.    Assessment and Plan:  Aspiration pneumonia  -- sepsis physiology resolved  -- continue aspiration precautions -- SLP evaluation with plan for MBS -- dysphagia diet  -- aspiration precautions -- adding chest PT and duonebs to help clear secretions if possible -- after MBS placed on dys 3 diet, thin liquids by SLP   Lactic acidosis  -- resolved with treatments  Stenotrophomonas maltophilia UTI  -- agree with Bactrim  DS with plan to treat for 7 days given complicated UTI  picture  Essential hypertension  --- BPs stable, follow  Urothelial carcinoma -- metastasis to bone and lymph nodes -- he hasn't been able to get his palliative radiation due to being hospitalized -- he is on chemo treatments per oncology -- goal of care is to resume chemo and radiation    GERD  -- pantoprazole  for GI protection   Urinary retention  -- foley cath was changed on admission -- tamsulosin  daily ordered  Protein calorie malnutrition  -- agree with meal supplementation as ordered   Goal of care -- long discussion with family on 12/7 about patient's precipitous decline in last months especially after recent CVA. Per GOC discussion continue chemo/radiation treatments.  DVT prophylaxis: enoxaparin  Code Status: [id number] Family Communication: long meeting 12/7 Disposition: TBD   Consultants:  Palliative  Procedures:   Antimicrobials:  Bactrim  DS 12/6>>   Subjective: Not eating as well independently, still with cough and chest congestion.   Objective: Vitals:   06/05/24 0413 06/05/24 0755 06/05/24 1242 06/05/24 1337  BP: (!) 144/65  121/60   Pulse: 100  85   Resp: 19  16   Temp: 98.3 F (36.8 C)  98.2 F (36.8 C)   TempSrc: Oral  Oral   SpO2: 93% 93% 97% 97%  Weight:      Height:        Intake/Output Summary (Last 24 hours) at 06/05/2024 1557 Last data filed at 06/05/2024 1215 Gross per 24 hour  Intake 530 ml  Output 675 ml  Net -145 ml   Filed Weights   06/02/24 2005  Weight: 63.5 kg   Examination:  General exam: Appears calm and comfortable  Respiratory system: cough and chest congestion.  Cardiovascular system: normal S1 & S2 heard. No JVD, murmurs, rubs, gallops or clicks. No pedal edema. Gastrointestinal system: Abdomen is nondistended, soft and nontender. No organomegaly or masses felt. Normal bowel sounds heard. Central nervous system: Alert and oriented. No focal neurological deficits. Extremities: Symmetric 5 x 5 power. Skin: No  rashes, lesions or ulcers. Psychiatry: Judgement and insight appear normal. Mood & affect appropriate.   Data Reviewed: I have personally reviewed following labs and imaging studies  CBC: Recent Labs  Lab 06/02/24 2022 06/03/24 0406 06/04/24 0450 06/05/24 0443  WBC 14.1* 14.4* 11.8* 9.0  NEUTROABS 12.7*  --  10.0* 7.2  HGB 10.4* 8.7* 8.6* 8.3*  HCT 33.3* 27.6* 28.0* 26.0*  MCV 97.1 95.5 94.3 94.9  PLT 394 301 267 256    Basic Metabolic Panel: Recent Labs  Lab 06/01/24 0823 06/02/24 2022 06/03/24 0406 06/04/24 0450 06/05/24 0443  NA 134* 136 137 136 135  K 3.4* 3.9 3.4* 3.4* 3.3*  CL 100 98 103 103 103  CO2 25 26 25 24  19*  GLUCOSE 101* 132* 108* 97 95  BUN 24* 22 22 19 18   CREATININE 0.81 0.93 0.81 0.76 0.78  CALCIUM  8.6* 9.2 8.4* 8.6* 8.5*  MG  --   --  2.0 1.9  --   PHOS  --   --  3.3  --   --     CBG: No results for input(s): GLUCAP in the last 168 hours.  Recent Results (from the past 240 hours)  Urine Culture     Status: Abnormal   Collection Time: 05/27/24 12:36 PM   Specimen: Urine, Random  Result Value Ref Range Status   Specimen Description   Final    URINE, RANDOM Performed at [facility], [address], [place], [place] [address]    Special Requests   Final    NONE Reflexed from [phone] [id number] Performed at [facility], [address] [place], [place] [address]    Culture (A)  Final    20,000 COLONIES/mL STENOTROPHOMONAS MALTOPHILIA Sent to [facility] for further susceptibility testing. SEE SEPARATE REPORT Performed at [facility], [address] [place], [place] [address]    Report Status 06/02/2024 FINAL  Final  Susceptibility, Aer + Anaerob     Status: Abnormal   Collection Time: 05/27/24 12:36 PM  Result Value Ref Range Status   Suscept, Aer + Anaerob Final report (A)  Corrected    Comment: (NOTE) Performed At: [facility] [address] [place], [place] [id number] Jennette Shorter MD Ey:[id number] CORRECTED ON 12/05 AT  1535: PREVIOUSLY REPORTED AS Preliminary report    Source STENOTROPHOMONAS MALTOPHILIA URINE CULTURE  Final    Comment: Performed at [facility], [address], [place], [place] [address]  Susceptibility Result     Status: Abnormal   Collection Time: 05/27/24 12:36 PM  Result Value Ref Range Status   Suscept Result 1 Comment (A)  Final    Comment: (NOTE) Stenotrophomonas maltophilia Identification performed by account, not confirmed by this laboratory. Testing performed by broth microdilution.    Antimicrobial Suscept Comment  Corrected    Comment: (NOTE)      ** S = Susceptible; I = Intermediate; R = Resistant **                   P = Positive; N = Negative  MICS are expressed in micrograms per mL   Antibiotic                 RSLT#1    RSLT#2    RSLT#3    RSLT#4 Ceftazidime                    S Levofloxacin                    S Minocycline                    S Trimethoprim /Sulfa              S Performed At: [facility] [address] [place], [place] [id number] Jennette Shorter MD Ey:[phone]   MRSA Next Gen by PCR, Nasal     Status: None   Collection Time: 05/27/24  6:15 PM   Specimen: Nasal Mucosa; Nasal Swab  Result Value Ref Range Status   MRSA by PCR Next Gen NOT DETECTED NOT DETECTED Final    Comment: (NOTE) The GeneXpert MRSA Assay (FDA approved for NASAL specimens only), is one component of a comprehensive MRSA colonization surveillance program. It is not intended to diagnose MRSA infection nor to guide or monitor treatment for MRSA infections. Test performance is not FDA approved in patients less than 24 years old. Performed at [facility], [address], [place], [place] [address]   Culture, blood (Routine x 2)     Status: None (Preliminary result)   Collection Time: 06/02/24  8:22 PM   Specimen: Right Antecubital; Blood  Result Value Ref Range Status   Specimen Description RIGHT ANTECUBITAL  Final   Special Requests   Final     BOTTLES DRAWN AEROBIC AND ANAEROBIC Blood Culture adequate volume   Culture   Final    NO GROWTH 3 DAYS Performed at [facility], [address], [place], [place] [address]    Report Status PENDING  Incomplete  Urine Culture     Status: Abnormal (Preliminary result)   Collection Time: 06/02/24  8:22 PM   Specimen: Urine, Random  Result Value Ref Range Status   Specimen Description   Final    URINE, RANDOM Performed at [facility], [address], [place], [place] [address]    Special Requests   Final    NONE Reflexed from [phone] [id number] Performed at [facility], [address] [place], [place] [address]    Culture (A)  Final    >=100,000 COLONIES/mL STENOTROPHOMONAS MALTOPHILIA Sent to [facility] for further susceptibility testing. Performed at [facility], [address], [place], [place] [address]    Report Status PENDING  Incomplete  Susceptibility, Aer + Anaerob     Status: None   Collection Time: 06/02/24  8:22 PM  Result Value Ref Range Status   Suscept, Aer + Anaerob PENDING  Incomplete   Source STENOTROPHOMONAS MALTOPHILIA/ URINE  Corrected    Comment: Performed at [facility], [address], [place], [place] [address] CORRECTED ON 12/08 AT 0844: PREVIOUSLY REPORTED AS URINE    Culture, blood (Routine x 2)     Status: None (Preliminary result)   Collection Time: 06/02/24  8:42 PM   Specimen: BLOOD RIGHT ARM  Result Value Ref Range Status   Specimen Description BLOOD RIGHT ARM  Final   Special Requests   Final    BOTTLES DRAWN AEROBIC AND ANAEROBIC Blood Culture adequate volume   Culture   Final  NO GROWTH 3 DAYS Performed at [facility], [address], [place], [place] [address]    Report Status PENDING  Incomplete     Radiology Studies: DG Swallowing Func-Speech Pathology Result Date: 06/05/2024 Table formatting from the original result was not included. Modified Barium Swallow Study Patient Details Name: Dean Randolph MRN: [id number] Date of Birth:  08-07-1940 Today's Date: 06/05/2024 HPI/PMH: HPI: 83 y.o. 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 He had recent admission 11/29-12/4 with findings of new stroke. Initial plan was to discharge to CIR, however, insurance declined to pay. He was discharged home with PT/OT and had quick return due to respiratory symptoms. BSE completed yesterday with recommendation for MBS. Clinical Impression: Clinical Impression: MBSS completed during acute stay and Pt was assessed in the lateral position with barium tinged thin, nectar, honey, puree, regular textures, and a barium tablet in puree. Pt presents with mi/mod oropharyngeal phase dysphagia with suspected esophageal component. Oral phase is marked by reduced labial closure and mild lingual weakness with residual post swallow; pharyngeal phase is marked by swallow trigger after filling the valleculae with thins, min reduced tonge base retraction and hyolaryngeal excursion, and incomplete epiglottic deflection resulting in residuals in the valleculae which then spill to the lateral channels and pyriforms, and trace penetration of thins after the swallow. Pt was noted to have aspiration of secretions mixed with barium after he consumes honey thick liquids followed by puree and regular textures. Pt with congested cough and phlegm tinged with barium was noted to fall beneath the vocal folds and then expelled with a cough and swallowed. The barium tablet was presented in puree and briefly stopped in the valleculae before traversiing the esophagus and delayed in what appeared to be a hiatal hernia (no radiologist present to confirm). Min backflow of barium noted during esophageal sweep. Recommend D3/mech soft and thin liquids via cup/straw sips when Pt is alert and upright and swallow 2-3x for each bite/sip, pills  whole in puree, and remain upright for ~30 minutes post meals for reflux precautions. SLP will follow during acute stay to review recommendations with Pt and family. Factors that may increase risk of adverse event in presence of aspiration Noe & Lianne 2021): Factors that may increase risk of adverse event in presence of aspiration Noe & Lianne 2021): Frail or deconditioned Recommendations/Plan: Swallowing Evaluation Recommendations Swallowing Evaluation Recommendations Recommendations: PO diet PO Diet Recommendation: Dysphagia 3 (Mechanical soft); Thin liquids (Level 0) Liquid Administration via: Straw; Cup Medication Administration: Whole meds with puree Supervision: Patient able to self-feed; Intermittent supervision/cueing for swallowing strategies Swallowing strategies  : Slow rate; Small bites/sips; Multiple dry swallows after each bite/sip Postural changes: Position pt fully upright for meals; Stay upright 30-60 min after meals Oral care recommendations: Oral care BID (2x/day); Staff/trained caregiver to provide oral care Treatment Plan Treatment Plan Treatment recommendations: Therapy as outlined in treatment plan below Follow-up recommendations: Follow physicians's recommendations for discharge plan and follow up therapies Functional status assessment: Patient has had a recent decline in their functional status and demonstrates the ability to make significant improvements in function in a reasonable and predictable amount of time. Treatment frequency: Min 2x/week Treatment duration: 1 week Interventions: Aspiration precaution training; Compensatory techniques; Patient/family education; Diet toleration management by SLP Recommendations Recommendations for follow up therapy are one component of a multi-disciplinary discharge planning process, led by  the attending physician.  Recommendations may be updated based on patient status, additional functional criteria and insurance authorization.  Assessment: Orofacial Exam: Orofacial Exam Oral Cavity: Oral Hygiene: WFL Oral Cavity - Dentition: Missing dentition Orofacial Anatomy: WFL Oral Motor/Sensory Function: WFL Anatomy: Anatomy: WFL Boluses Administered: Boluses Administered Boluses Administered: Thin liquids (Level 0); Mildly thick liquids (Level 2, nectar thick); Moderately thick liquids (Level 3, honey thick); Puree; Solid  Oral Impairment Domain: Oral Impairment Domain Lip Closure: Escape progressing to mid-chin Tongue control during bolus hold: Escape to lateral buccal cavity/floor of mouth Bolus preparation/mastication: Slow prolonged chewing/mashing with complete recollection Bolus transport/lingual motion: Delayed initiation of tongue motion (oral holding) Oral residue: Trace residue lining oral structures Location of oral residue : Tongue Initiation of pharyngeal swallow : Valleculae  Pharyngeal Impairment Domain: Pharyngeal Impairment Domain Soft palate elevation: Trace column of contrast or air between SP and PW Laryngeal elevation: Partial superior movement of thyroid  cartilage/partial approximation of arytenoids to epiglottic petiole Anterior hyoid excursion: Partial anterior movement Epiglottic movement: Partial inversion Laryngeal vestibule closure: Incomplete, narrow column air/contrast in laryngeal vestibule Pharyngeal stripping wave : Present - complete Pharyngeal contraction (A/P view only): N/A Pharyngoesophageal segment opening: Complete distension and complete duration, no obstruction of flow Tongue base retraction: Narrow column of contrast or air between tongue base and PPW Pharyngeal residue: Collection of residue within or on pharyngeal structures Location of pharyngeal residue: Valleculae; Pyriform sinuses; Tongue base  Esophageal Impairment Domain: Esophageal Impairment Domain Esophageal clearance upright position: Esophageal retention with retrograde flow below pharyngoesophageal segment (PES) Pill: Pill Consistency  administered: Puree Puree: Impaired (see clinical impressions) Penetration/Aspiration Scale Score: Penetration/Aspiration Scale Score 1.  Material does not enter airway: Puree; Solid; Pill; Mildly thick liquids (Level 2, nectar thick) 3.  Material enters airway, remains ABOVE vocal cords and not ejected out: Thin liquids (Level 0) 6.  Material enters airway, passes BELOW cords then ejected out: Moderately thick liquids (Level 3, honey thick) (see impression statement- mixed with secretions/phlegm) Compensatory Strategies: Compensatory Strategies Compensatory strategies: Yes Multiple swallows: Effective Effective Multiple Swallows: Thin liquid (Level 0); Mildly thick liquid (Level 2, nectar thick); Moderately thick liquid (Level 3, honey thick); Puree   General Information: Caregiver present: No  Diet Prior to this Study: Dysphagia 2 (finely chopped); Thin liquids (Level 0)   Temperature : Normal   Respiratory Status: WFL   Supplemental O2: None (Room air)   History of Recent Intubation: No  Behavior/Cognition: Alert; Cooperative Self-Feeding Abilities: Able to self-feed Baseline vocal quality/speech: Normal Volitional Cough: Able to elicit Volitional Swallow: Able to elicit Exam Limitations: No limitations (some limitations with shoulder) Goal Planning: Prognosis for improved oropharyngeal function: Good No data recorded No data recorded Patient/Family Stated Goal: return home healthy Consulted and agree with results and recommendations: Patient Pain: Pain Assessment Pain Assessment: No/denies pain End of Session: Start Time:SLP Start Time (ACUTE ONLY): 1312 Stop Time: SLP Stop Time (ACUTE ONLY): 1342 Time Calculation:SLP Time Calculation (min) (ACUTE ONLY): 30 min Charges: SLP Evaluations $ SLP Speech Visit: 1 Visit SLP Evaluations $BSS Swallow: 1 Procedure $MBS Swallow: 1 Procedure SLP visit diagnosis: SLP Visit Diagnosis: Dysphagia, oropharyngeal phase (R13.12) Past Medical History: Past Medical History:  Diagnosis Date  Bladder cancer (HCC)   Hypertension  Past Surgical History: Past Surgical History: Procedure Laterality Date  BILIARY DILATION  03/12/2022  Procedure: BILIARY DILATION;  Surgeon: Mansouraty, Aloha Raddle., MD;  Location: [facility];  Service: Gastroenterology;;  BILIARY STENT PLACEMENT N/A 02/06/2021  Procedure: BILIARY STENT PLACEMENT;  Surgeon:  Golda Claudis PENNER, MD;  Location: [facility];  Service: Gastroenterology;  Laterality: N/A;  BILIARY STENT PLACEMENT N/A 03/26/2021  Procedure: BILIARY STENT PLACEMENT 10 FRENCH BY 9cm;  Surgeon: Golda Claudis PENNER, MD;  Location: [facility];  Service: Endoscopy;  Laterality: N/A;  BILIARY STENT PLACEMENT N/A 01/19/2022  Procedure: BILIARY STENT PLACEMENT;  Surgeon: Wilhelmenia Aloha Raddle., MD;  Location: [facility];  Service: Gastroenterology;  Laterality: N/A;  BIOPSY  01/19/2022  Procedure: BIOPSY;  Surgeon: Wilhelmenia Aloha Raddle., MD;  Location: [facility];  Service: Gastroenterology;;  CATARACT EXTRACTION Bilateral   CHOLECYSTECTOMY N/A 04/25/2021  Procedure: LAPAROSCOPIC CHOLECYSTECTOMY;  Surgeon: Mavis Anes, MD;  Location: [facility];  Service: General;  Laterality: N/A;  ENDOSCOPIC RETROGRADE CHOLANGIOPANCREATOGRAPHY (ERCP) WITH PROPOFOL  N/A 01/19/2022  Procedure: ENDOSCOPIC RETROGRADE CHOLANGIOPANCREATOGRAPHY (ERCP) WITH PROPOFOL ;  Surgeon: Wilhelmenia Aloha Raddle., MD;  Location: [facility];  Service: Gastroenterology;  Laterality: N/A;  ENDOSCOPIC RETROGRADE CHOLANGIOPANCREATOGRAPHY (ERCP) WITH PROPOFOL  N/A 03/12/2022  Procedure: ENDOSCOPIC RETROGRADE CHOLANGIOPANCREATOGRAPHY (ERCP) WITH PROPOFOL ;  Surgeon: Wilhelmenia Aloha Raddle., MD;  [facility]: [facility];  Service: Gastroenterology;  Laterality: N/A;  ERCP N/A 02/06/2021  Procedure: ENDOSCOPIC RETROGRADE CHOLANGIOPANCREATOGRAPHY (ERCP);  Surgeon: Golda Claudis PENNER, MD;  Location: [facility];  Service: Gastroenterology;  Laterality: N/A;  ERCP N/A 03/26/2021  Procedure: ENDOSCOPIC RETROGRADE  CHOLANGIOPANCREATOGRAPHY (ERCP) with EXTENDED SPHINCTEROTOMY;  Surgeon: Golda Claudis PENNER, MD;  Location: [facility];  Service: Endoscopy;  Laterality: N/A;  ERCP N/A 10/27/2021  Procedure: ENDOSCOPIC RETROGRADE CHOLANGIOPANCREATOGRAPHY (ERCP);  Surgeon: Golda Claudis PENNER, MD;  Location: [facility];  Service: Endoscopy;  Laterality: N/A;  no site   GASTROINTESTINAL STENT REMOVAL N/A 03/26/2021  Procedure: GASTROINTESTINAL STENT REMOVAL;  Surgeon: Golda Claudis PENNER, MD;  Location: [facility];  Service: Endoscopy;  Laterality: N/A;  GASTROINTESTINAL STENT REMOVAL N/A 10/27/2021  Procedure: GASTROINTESTINAL STENT EXCHANGE;  Surgeon: Golda Claudis PENNER, MD;  Location: [facility];  Service: Endoscopy;  Laterality: N/A;  no site   REMOVAL OF STONES N/A 03/26/2021  Procedure: REMOVAL OF STONES WITH EXTRACTION BASKET;  Surgeon: Golda Claudis PENNER, MD;  Location: [facility];  Service: Endoscopy;  Laterality: N/A;  REMOVAL OF STONES N/A 10/27/2021  Procedure: REMOVAL OF STONES;  Surgeon: Golda Claudis PENNER, MD;  Location: [facility];  Service: Endoscopy;  Laterality: N/A;  REMOVAL OF STONES  01/19/2022  Procedure: REMOVAL OF STONES;  Surgeon: Wilhelmenia Aloha Raddle., MD;  Location: [facility];  Service: Gastroenterology;;  REMOVAL OF STONES  03/12/2022  Procedure: REMOVAL OF STONES;  Surgeon: Wilhelmenia Aloha Raddle., MD;  Location: [facility];  Service: Gastroenterology;;  ANNETT N/A 02/06/2021  Procedure: ANNETT;  Surgeon: Golda Claudis PENNER, MD;  Location: [facility];  Service: Gastroenterology;  Laterality: N/A;  SPHINCTEROTOMY N/A 10/27/2021  Procedure: SPHINCTEROTOMY;  Surgeon: Golda Claudis PENNER, MD;  Location: [facility];  Service: Endoscopy;  Laterality: N/A;  SPYGLASS CHOLANGIOSCOPY N/A 01/19/2022  Procedure: SPYGLASS CHOLANGIOSCOPY;  Surgeon: Wilhelmenia Aloha Raddle., MD;  Location: [facility];  Service: Gastroenterology;  Laterality: N/A;  SPYGLASS CHOLANGIOSCOPY N/A 03/12/2022  Procedure: DEBHOJDD CHOLANGIOSCOPY;  Surgeon: Wilhelmenia Aloha Raddle., MD;  Location: [facility];  Service: Gastroenterology;  Laterality: N/A;  SPYGLASS LITHOTRIPSY N/A 03/12/2022  Procedure: DEBHOJDD LITHOTRIPSY;  Surgeon: Wilhelmenia Aloha Raddle., MD;  Location: [facility];  Service: Gastroenterology;  Laterality: N/A;  STENT REMOVAL  01/19/2022  Procedure: STENT REMOVAL;  Surgeon: Wilhelmenia Aloha Raddle., MD;  Location: [facility];  Service: Gastroenterology;;  CLEDA REMOVAL  03/12/2022  Procedure: STENT REMOVAL;  Surgeon: Wilhelmenia Aloha Raddle., MD;  Location: [facility];  Service:  Gastroenterology;;  STONE EXTRACTION WITH BASKET  01/19/2022  Procedure: STONE EXTRACTION WITH BASKET;  Surgeon: Wilhelmenia Aloha Raddle., MD;  Location: [facility];  Service: Gastroenterology;; Thank you, Lamar Candy, CCC-SLP [phone] PORTER,DABNEY 06/05/2024, 2:46 PM  DG CHEST PORT 1 VIEW Result Date: 06/05/2024 CLINICAL DATA:  Aspiration pneumonia. EXAM: PORTABLE CHEST 1 VIEW COMPARISON:  06/02/2024 FINDINGS: Cardiopericardial silhouette is at upper limits of normal for size. Interstitial with diffuse patchy bilateral airspace disease is similar to prior. No substantial pleural effusion. Telemetry leads overlie the chest. IMPRESSION: No substantial interval change. Diffuse patchy bilateral airspace disease compatible with asymmetric edema or diffuse infection. Electronically Signed   By: Camellia Candle M.D.   On: 06/05/2024 05:47   Scheduled Meds:  aspirin  EC  81 mg Oral Daily   atorvastatin   20 mg Oral QPM   Chlorhexidine  Gluconate Cloth  6 each Topical Q0600   clopidogrel   75 mg Oral Daily   dextromethorphan   30 mg Oral BID   enoxaparin  (LOVENOX ) injection  40 mg Subcutaneous Q24H   famotidine   20 mg Oral QHS   feeding supplement  237 mL Oral BID BM   guaiFENesin   10 mL Oral TID   ipratropium-albuterol   3 mL Nebulization TID   mirtazapine   15 mg Oral QHS   pantoprazole   40 mg Oral Daily   sulfamethoxazole -trimethoprim   1 tablet Oral Q12H   tamsulosin    0.4 mg Oral Daily   Continuous Infusions:   LOS: 3 days   Time spent: 55 mins  Cotina Freedman Vicci, MD How to contact the [facility] Attending or Consulting provider 7A - 7P or covering provider during after hours 7P -7A, for this patient?  Check the care team in [facility] and look for a) attending/consulting TR[facility] provider listed and b) the TRH team listed Log into [email or web address] to find provider on call.  Locate the [facility] provider you are looking for under [facility] and page to a number that you can be directly reached. If you still have difficulty reaching the provider, please page the [facility] (Director on Call) for the Hospitalists listed on amion for assistance.  06/05/2024, 3:57 PM

## 2024-06-05 NOTE — Consult Note (Signed)
 Consultation Note Date: 06/05/2024   Patient Name: Dean Randolph  DOB: 02-07-1941  MRN: [id number]  Age / Sex: 83 y.o., male  PCP: Davonna Siad, MD Referring Physician: Vicci Afton CROME, MD  Reason for Consultation:  goals of care  HPI/Patient Profile: 83 y.o. 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 He had recent admission 11/29-12/4 with findings of new stroke. Initial plan was to discharge to CIR, however, insurance declined to pay. He was discharged home with PT/OT and had quick return due to respiratory symptoms. Palliative medicine consulted for goals of care.    Primary Decision Maker PATIENT  Discussion: Chart reviewed including labs, progress notes, imaging from this and previous encounters.  Chest xray from today reviewed- diffuse airspace disease.  SLP note reviewed- no overt signs of aspiration noted. Dysphagia diet and instrumental swallow eval recommended.  Labs reviewed- kidney function stable with GFR >60. CBC with WBC down to 9.0 today- was 14.1 on admission.  Vitals are stable, patient is on room air.  Met with patient and his spouse at bedside. Prior to admission patient was living at home. He ambulates with a walker.  We discussed his current acute and chronic issues. He is awaiting MBS test today for further information regarding his possible dysphagia.  Patient and family are hopeful for him to continue to treat what is treatable, and to continue treatments for his cancer.  He has a living will and HCPOA designating his spouse as oncologist.  We discussed code status. Patient does not wish to be resuscitated or placed on life support. He and spouse agreed to change in code status to DNR.    SUMMARY OF  RECOMMENDATIONS -Pneumonia, ?aspiration in setting of stage IV urothelial cancer and recent new strokes- goals are to continue to treat the treatable and resume cancer treatments- last treatment was on 11/17. He has been referred to [facility] for Palliative services at home. -Advanced Care planning- patient has living will and HCPOA- code status changed to DNR per his request    Code Status/Advance Care Planning:   Code Status: Limited: Do not attempt resuscitation (DNR) -DNR-LIMITED -Do Not Intubate/DNI     Prognosis:   Unable to determine  Discharge Planning: Home with Home Health  Primary Diagnoses: Present on Admission:  HCAP (healthcare-associated pneumonia)   Review of Systems  Constitutional:  Positive for activity change.    Physical Exam Vitals and nursing note reviewed.  Constitutional:      Comments: thin  Cardiovascular:     Rate and Rhythm: Normal rate.  Pulmonary:     Effort: Pulmonary effort is normal.  Neurological:     Mental Status: He is oriented to person, place, and time.     Vital Signs: BP (!) 144/65 (BP Location: Left Arm)   Pulse 100   Temp 98.3 F (36.8 C) (Oral)   Resp 19  Ht 5' 7 (1.702 m)   Wt 63.5 kg   SpO2 93%   BMI 21.93 kg/m  Pain Scale: 0-10   Pain Score: 0-No pain   SpO2: SpO2: 93 % O2 Device:SpO2: 93 % O2 Flow Rate: .O2 Flow Rate (L/min): 1 L/min  IO: Intake/output summary:  Intake/Output Summary (Last 24 hours) at 06/05/2024 1222 Last data filed at 06/05/2024 1215 Gross per 24 hour  Intake 530 ml  Output 675 ml  Net -145 ml    LBM: Last BM Date : 06/04/24 Baseline Weight: Weight: 63.5 kg Most recent weight: Weight: 63.5 kg       Thank you for this consult. Palliative medicine will continue to follow and assist as needed.   Signed by: Cassondra Stain, AGNP-C Palliative Medicine  Time includes:   Preparing to see the patient (e.g., review of tests) Obtaining and/or reviewing separately obtained  history Performing a medically necessary appropriate examination and/or evaluation Counseling and educating the patient/family/caregiver Ordering medications, tests, or procedures Referring and communicating with other health care professionals (when not reported separately) Documenting clinical information in the electronic or other health record Independently interpreting results (not reported separately) and communicating results to the patient/family/caregiver Care coordination (not reported separately) Clinical documentation   Please contact Palliative Medicine Team phone at [phone] for questions and concerns.  For individual provider: See Tracey

## 2024-06-05 NOTE — Plan of Care (Signed)
   Problem: Education: Goal: Knowledge of General Education information will improve Description: Including pain rating scale, medication(s)/side effects and non-pharmacologic comfort measures Outcome: Progressing   Problem: Activity: Goal: Risk for activity intolerance will decrease Outcome: Progressing   Problem: Nutrition: Goal: Adequate nutrition will be maintained Outcome: Progressing

## 2024-06-05 NOTE — TOC Progression Note (Addendum)
 Transition of Care [facility]) - Progression Note    Patient Details  Name: Dean Randolph MRN: [id number] Date of Birth: 1941-02-20  [facility]) CM/SW Contact  [name], RN Phone Number: 06/05/2024, 4:37 PM  Clinical Narrative:   PT is recommending SNF. Long conversation with his daughter, could not reach is wife. She was here earlier to meet with Palliative but has not discuss anything with her daughter as of yet. Per Daughter he is not ready to discharge and they do not think he will go to SNF.  [facility] denied CIR, patient walked 50 feet. They want more services in the home. CM explained he has everything ordered for home health. She stated [facility] start of care was going to be 5 days after he was discharged. They can not care for him 24/7. She will talk with her mother. CM called [employer] to screen for medicaid, if qualified, they could possibly get a cap aide. IPCM following.     Expected Discharge Plan: Home w Home Health Services Barriers to Discharge: Continued Medical Work up               Expected Discharge Plan and Services In-house Referral: Clinical Social Work Discharge Planning Services: NA Post Acute Care Choice: Home Health, Resumption of Svcs/PTA Provider Living arrangements for the past 2 months: Single Family Home                    Social Drivers of Health (SDOH) Interventions SDOH Screenings   Food Insecurity: No Food Insecurity (06/02/2024)  Housing: Low Risk  (06/02/2024)  Transportation Needs: No Transportation Needs (06/02/2024)  Utilities: Not At Risk (06/02/2024)  Depression (PHQ2-9): Low Risk  (05/15/2024)  Social Connections: Moderately Integrated (06/02/2024)  Tobacco Use: Low Risk  (06/02/2024)    Readmission Risk Interventions    06/03/2024    8:58 AM 05/28/2024   12:10 PM 05/02/2024    9:55 AM  Readmission Risk Prevention Plan  Transportation Screening Complete Complete Complete  PCP or Specialist Appt within 5-7 Days   Not  Complete  Not Complete comments   PCP list added to AVS  PCP or Specialist Appt within 3-5 Days  Not Complete   Home Care Screening   Complete  Medication Review (RN CM)   Complete  HRI or Home Care Consult  Complete   Social Work Consult for Recovery Care Planning/Counseling  Complete   Palliative Care Screening  Not Complete   Medication Review Oceanographer) Complete Complete   PCP or Specialist appointment within 3-5 days of discharge Complete    HRI or Home Care Consult Complete    SW Recovery Care/Counseling Consult Complete    Palliative Care Screening Complete    Skilled Nursing Facility Not Applicable

## 2024-06-05 NOTE — TOC Progression Note (Signed)
 Transition of Care [facility]) - Progression Note    Patient Details  Name: Dean Randolph MRN: [id number] Date of Birth: [birth year]  [facility] Care [facility]) CM/SW Contact  [name], RN Phone Number: 06/05/2024, 1:33 PM  Clinical Narrative:   Palliative consulted, PT is recommending SNF. Patient was denied for CIR. IPCM following. Patient is not medically ready for discharge.     Expected Discharge Plan: Home w Home Health Services Barriers to Discharge: Continued Medical Work up      Expected Discharge Plan and Services In-house Referral: Clinical Social Work Discharge Planning Services: NA Post Acute Care Choice: Home Health, Resumption of Svcs/PTA Provider Living arrangements for the past 2 months: Single Family Home                      Social Drivers of Health (SDOH) Interventions SDOH Screenings   Food Insecurity: No Food Insecurity (06/02/2024)  Housing: Low Risk  (06/02/2024)  Transportation Needs: No Transportation Needs (06/02/2024)  Utilities: Not At Risk (06/02/2024)  Depression (PHQ2-9): Low Risk  (05/15/2024)  Social Connections: Moderately Integrated (06/02/2024)  Tobacco Use: Low Risk  (06/02/2024)    Readmission Risk Interventions    06/03/2024    8:58 AM 05/28/2024   12:10 PM 05/02/2024    9:55 AM  Readmission Risk Prevention Plan  Transportation Screening Complete Complete Complete  PCP or Specialist Appt within 5-7 Days   Not Complete  Not Complete comments   PCP list added to AVS  PCP or Specialist Appt within 3-5 Days  Not Complete   Home Care Screening   Complete  Medication Review (RN CM)   Complete  HRI or Home Care Consult  Complete   Social Work Consult for Recovery Care Planning/Counseling  Complete   Palliative Care Screening  Not Complete   Medication Review Oceanographer) Complete Complete   PCP or Specialist appointment within 3-5 days of discharge Complete    HRI or Home Care Consult Complete    SW Recovery Care/Counseling  Consult Complete    Palliative Care Screening Complete    Skilled Nursing Facility Not Applicable

## 2024-06-05 NOTE — Evaluation (Signed)
 Occupational Therapy Evaluation Patient Details Name: Dean Randolph MRN: [id number] DOB: August 26, 1940 Today's Date: 06/05/2024   History of Present Illness   Dean Randolph is a 83 y.o. male with medical history significant of hypertension, history of bladder cancer with metastatic disease to lumbar and thoracic spine who was recently admitted from 11/29 to 12/4 due to acute metabolic encephalopathy due to small acute to early subacute bilateral occipital infarcts.  Daughter at bedside states that patient has had a mild cough prior to being discharged, this worsened today (a day after being discharged), cough was productive with yellowish-green sputum and it was associated with shortness of breath when he has a coughing fit. (per DO)     Clinical Impressions Pt agreeable to OT and PT co-evaluation. Pt required no physical assist for bed mobility. CGA to min A needed for functional transfers with RW. Pt's B UE is generally weak with mild A/ROM deficits bilaterally. No apparent visual deficits today. WFL visual pursuits noted during testing. Labored movement and extended time to doff and don sock seated at EOB, but no physical assist. Pt left in the chair with call bell within reach and family present. Pt will benefit from continued OT in the hospital to increase strength, balance, and endurance for safe ADL's.        If plan is discharge home, recommend the following:   A little help with bathing/dressing/bathroom;Assistance with cooking/housework;Assist for transportation;Help with stairs or ramp for entrance;A little help with walking and/or transfers     Functional Status Assessment   Patient has had a recent decline in their functional status and demonstrates the ability to make significant improvements in function in a reasonable and predictable amount of time.     Equipment Recommendations   None recommended by OT             Precautions/Restrictions    Precautions Precautions: Fall Recall of Precautions/Restrictions: Intact Restrictions Weight Bearing Restrictions Per Provider Order: No     Mobility Bed Mobility Overal bed mobility: Modified Independent             General bed mobility comments: mild labored effort    Transfers Overall transfer level: Needs assistance Equipment used: Rolling walker (2 wheels) Transfers: Sit to/from Stand, Bed to chair/wheelchair/BSC Sit to Stand: Contact guard assist, Min assist     Step pivot transfers: Contact guard assist, Min assist     General transfer comment: EOB to chair with RW      Balance Overall balance assessment: Needs assistance Sitting-balance support: No upper extremity supported, Feet supported Sitting balance-Leahy Scale: Fair Sitting balance - Comments: seated at EOB   Standing balance support: Bilateral upper extremity supported, During functional activity, No upper extremity supported Standing balance-Leahy Scale: Fair Standing balance comment: fair RW                           ADL either performed or assessed with clinical judgement   ADL Overall ADL's : Needs assistance/impaired     Grooming: Set up;Sitting   Upper Body Bathing: Set up;Sitting   Lower Body Bathing: Set up;Contact guard assist;Sitting/lateral leans   Upper Body Dressing : Set up;Sitting   Lower Body Dressing: Set up;Contact guard assist;Sitting/lateral leans Lower Body Dressing Details (indicate cue type and reason): Able to manage socks seated in chair without physical assist but with labored effort. Toilet Transfer: Minimal assistance;Rolling walker (2 wheels);Stand-pivot;Contact guard assist Toilet Transfer Details (indicate cue type  and reason): EOB to chair with RW Toileting- Clothing Manipulation and Hygiene: Moderate assistance;Minimal assistance;Sitting/lateral lean;Sit to/from stand       Functional mobility during ADLs: Minimal assistance;Contact guard  assist;Rolling walker (2 wheels) General ADL Comments: Ambualted in the hall a short distance with RW.     Vision Baseline Vision/History: 1 Wears glasses Ability to See in Adequate Light: 1 Impaired Patient Visual Report: No change from baseline Vision Assessment?: No apparent visual deficits     Perception Perception: Not tested       Praxis Praxis: Not tested       Pertinent Vitals/Pain Pain Assessment Pain Assessment: No/denies pain     Extremity/Trunk Assessment Upper Extremity Assessment Upper Extremity Assessment: Generalized weakness (3-/5 shoulder flexion; generally weak.)   Lower Extremity Assessment Lower Extremity Assessment: Defer to PT evaluation   Cervical / Trunk Assessment Cervical / Trunk Assessment: Kyphotic   Communication Communication Communication: No apparent difficulties Factors Affecting Communication: Reduced clarity of speech   Cognition Arousal: Alert Behavior During Therapy: WFL for tasks assessed/performed Cognition: No apparent impairments                               Following commands: Intact       Cueing  General Comments   Cueing Techniques: Verbal cues;Tactile cues                 Home Living Family/patient expects to be discharged to:: Private residence Living Arrangements: Spouse/significant other;Children Available Help at Discharge: Family;Available 24 hours/day Type of Home: House Home Access: Level entry     Home Layout: Laundry or work area in basement     [employer] Shower/Tub: Arts Development Officer Toilet: Handicapped height Bathroom Accessibility: No How Accessible: Accessible via walker Home Equipment: Agricultural Consultant (2 wheels);BSC/3in1;Shower seat;Wheelchair - manual;Cane - single point;Grab bars - toilet;Grab bars - tub/shower   Additional Comments: Daugher lives near the home.  Lives With: Spouse    Prior Functioning/Environment Prior Level of Function :  Independent/Modified Independent             Mobility Comments: Pt reports as community ambulator without AD. The past 2 weeks the pt has needed physical assist to stand. ADLs Comments: Reports independence with ADLs/iADLs ; assist needed since recent hospitalization.    OT Problem List: Decreased strength;Decreased activity tolerance;Impaired balance (sitting and/or standing);Impaired vision/perception;Decreased coordination;Pain   OT Treatment/Interventions: Self-care/ADL training;Therapeutic exercise;DME and/or AE instruction;Neuromuscular education;Therapeutic activities;Visual/perceptual remediation/compensation;Patient/family education;Balance training      OT Goals(Current goals can be found in the care plan section)   Acute Rehab OT Goals Patient Stated Goal: Improve function. OT Goal Formulation: With patient/family Time For Goal Achievement: 06/19/24 Potential to Achieve Goals: Fair   OT Frequency:  Min 3X/week    Co-evaluation PT/OT/SLP Co-Evaluation/Treatment: Yes Reason for Co-Treatment: To address functional/ADL transfers   OT goals addressed during session: ADL's and self-care                       End of Session Equipment Utilized During Treatment: Rolling walker (2 wheels)  Activity Tolerance: Patient tolerated treatment well Patient left: in chair;with call bell/phone within reach;with family/visitor present  OT Visit Diagnosis: Unsteadiness on feet (R26.81);Other abnormalities of gait and mobility (R26.89);Muscle weakness (generalized) (M62.81)                Time: 9164-9143 OT Time Calculation (min): 21 min Charges:  OT General  Charges $OT Visit: 1 Visit OT Evaluation $OT Eval Low Complexity: 1 Low  Malva Diesing OT, MOT  Jayson Person 06/05/2024, 12:10 PM

## 2024-06-05 NOTE — Progress Notes (Signed)
 Modified Barium Swallow Study  Patient Details  Name: Dean Randolph MRN: [id number] Date of Birth: 02/27/1941  Today's Date: 06/05/2024  Modified Barium Swallow completed.  Full report located under Chart Review in the Imaging Section.  History of Present Illness 83 y.o. 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 He had recent admission 11/29-12/4 with findings of new stroke. Initial plan was to discharge to CIR, however, insurance declined to pay. He was discharged home with PT/OT and had quick return due to respiratory symptoms. BSE completed yesterday with recommendation for MBS.   Clinical Impression MBSS completed during acute stay and Pt was assessed in the lateral position with barium tinged thin, nectar, honey, puree, regular textures, and a barium tablet in puree. Pt presents with mi/mod oropharyngeal phase dysphagia with suspected esophageal component. Oral phase is marked by reduced labial closure and mild lingual weakness with residual post swallow; pharyngeal phase is marked by swallow trigger after filling the valleculae with thins, min reduced tonge base retraction and hyolaryngeal excursion, and incomplete epiglottic deflection resulting in residuals in the valleculae which then spill to the lateral channels and pyriforms, and trace penetration of thins after the swallow. Pt was noted to have aspiration of secretions mixed with barium after he consumes honey thick liquids followed by puree and regular textures. Pt with congested cough and phlegm tinged with barium was noted to fall beneath the vocal folds and then expelled with a cough and swallowed. The barium tablet was presented in puree and briefly stopped in the valleculae before traversiing the esophagus and delayed in what appeared to be a hiatal hernia (no  radiologist present to confirm). Min backflow of barium noted during esophageal sweep. Recommend D3/mech soft and thin liquids via cup/straw sips when Pt is alert and upright and swallow 2-3x for each bite/sip, pills whole in puree, and remain upright for ~30 minutes post meals for reflux precautions. SLP will follow during acute stay to review recommendations with Pt and family. Factors that may increase risk of adverse event in presence of aspiration Noe & Lianne 2021): Frail or deconditioned  Swallow Evaluation Recommendations Recommendations: PO diet PO Diet Recommendation: Dysphagia 3 (Mechanical soft);Thin liquids (Level 0) Liquid Administration via: Straw;Cup Medication Administration: Whole meds with puree Supervision: Patient able to self-feed;Intermittent supervision/cueing for swallowing strategies Swallowing strategies  : Slow rate;Small bites/sips;Multiple dry swallows after each bite/sip Postural changes: Position pt fully upright for meals;Stay upright 30-60 min after meals Oral care recommendations: Oral care BID (2x/day);Staff/trained caregiver to provide oral care     Thank you,  Lamar Candy, CCC-SLP [phone]  Eryka Dolinger 06/05/2024,2:46 PM

## 2024-06-05 NOTE — Plan of Care (Signed)

## 2024-06-05 NOTE — Progress Notes (Signed)
 Dr. Ricky made aware of patients difficulty with swallowing, verbal order to hold PO meds.

## 2024-06-05 NOTE — Progress Notes (Signed)
 Physical Therapy Treatment Patient Details Name: Dean Randolph MRN: [id number] DOB: [birth year] Today's Date: 06/05/2024   History of Present Illness Dean Randolph is a 83 y.o. male with medical history significant of hypertension, history of bladder cancer with metastatic disease to lumbar and thoracic spine who was recently admitted from 11/29 to 12/4 due to acute metabolic encephalopathy due to small acute to early subacute bilateral occipital infarcts.  Daughter at bedside states that patient has had a mild cough prior to being discharged, this worsened today (a day after being discharged), cough was productive with yellowish-green sputum and it was associated with shortness of breath when he has a coughing fit.    PT Comments  Patient demonstrates labored movement for sitting up at bedside and transferring to chair using RW, tolerated ambulating in room/hallway with slow labored movement, but limited mostly due to c/o fatigue and generalized weakness. Patient tolerated sitting up in chair after therapy with spouse present. Patient will benefit from continued skilled physical therapy in hospital and recommended venue below to increase strength, balance, endurance for safe ADLs and gait.       If plan is discharge home, recommend the following: A little help with walking and/or transfers;A little help with bathing/dressing/bathroom;Help with stairs or ramp for entrance;Assist for transportation;Assistance with cooking/housework   Can travel by private vehicle     Yes  Equipment Recommendations  None recommended by PT    Recommendations for Other Services       Precautions / Restrictions Precautions Precautions: Fall Recall of Precautions/Restrictions: Intact Restrictions Weight Bearing Restrictions Per Provider Order: No     Mobility  Bed Mobility Overal bed mobility: Modified Independent             General bed mobility comments: slow labored movement for sitting up at  bedside    Transfers Overall transfer level: Needs assistance Equipment used: Rolling walker (2 wheels) Transfers: Sit to/from Stand, Bed to chair/wheelchair/BSC Sit to Stand: Contact guard assist, Min assist   Step pivot transfers: Contact guard assist, Min assist       General transfer comment: increased time, labored movement using RW    Ambulation/Gait Ambulation/Gait assistance: Min assist Gait Distance (Feet): 50 Feet Assistive device: Rolling walker (2 wheels) Gait Pattern/deviations: Decreased step length - left, Decreased stance time - right, Decreased stride length Gait velocity: Dec     General Gait Details: slow labored movement without loss of balance using RW, but limited mostly due to c/o fatigue and generalized weakness   Stairs             Wheelchair Mobility     Tilt Bed    Modified Rankin (Stroke Patients Only)       Balance Overall balance assessment: Needs assistance Sitting-balance support: Feet supported, No upper extremity supported Sitting balance-Leahy Scale: Fair Sitting balance - Comments: seated at EOB   Standing balance support: Bilateral upper extremity supported, During functional activity, No upper extremity supported Standing balance-Leahy Scale: Fair Standing balance comment: fair RW                            Communication Communication Communication: No apparent difficulties Factors Affecting Communication: Reduced clarity of speech  Cognition Arousal: Alert Behavior During Therapy: WFL for tasks assessed/performed   PT - Cognitive impairments: No apparent impairments  Following commands: Intact      Cueing Cueing Techniques: Verbal cues  Exercises      General Comments        Pertinent Vitals/Pain Pain Assessment Pain Assessment: No/denies pain    Home Living Family/patient expects to be discharged to:: Private residence Living Arrangements:  Spouse/significant other;Children Available Help at Discharge: Family;Available 24 hours/day Type of Home: House Home Access: Level entry       Home Layout: Laundry or work area in basement Home Equipment: Agricultural Consultant (2 wheels);BSC/3in1;Shower seat;Wheelchair - manual;Cane - single point;Grab bars - toilet;Grab bars - tub/shower Additional Comments: Daugher lives near the home.    Prior Function            PT Goals (current goals can now be found in the care plan section) Acute Rehab PT Goals Patient Stated Goal: return home with family to assist PT Goal Formulation: With patient/family Time For Goal Achievement: 06-14-2024 Potential to Achieve Goals: Good Progress towards PT goals: Progressing toward goals    Frequency    Min 3X/week      PT Plan      Co-evaluation PT/OT/SLP Co-Evaluation/Treatment: Yes Reason for Co-Treatment: To address functional/ADL transfers PT goals addressed during session: Mobility/safety with mobility OT goals addressed during session: ADL's and self-care      AM-PAC PT 6 Clicks Mobility   Outcome Measure  Help needed turning from your back to your side while in a flat bed without using bedrails?: None Help needed moving from lying on your back to sitting on the side of a flat bed without using bedrails?: None Help needed moving to and from a bed to a chair (including a wheelchair)?: A Little Help needed standing up from a chair using your arms (e.g., wheelchair or bedside chair)?: A Little Help needed to walk in hospital room?: A Little Help needed climbing 3-5 steps with a railing? : A Lot 6 Click Score: 19    End of Session   Activity Tolerance: Patient tolerated treatment well;Patient limited by fatigue Patient left: in chair;with call bell/phone within reach;with family/visitor present Nurse Communication: Mobility status PT Visit Diagnosis: Unsteadiness on feet (R26.81);Other abnormalities of gait and mobility  (R26.89);Muscle weakness (generalized) (M62.81)     Time: 9169-9146 PT Time Calculation (min) (ACUTE ONLY): 23 min  Charges:    $Gait Training: 8-22 mins $Therapeutic Activity: 8-22 mins PT General Charges $$ ACUTE PT VISIT: 1 Visit                     12:30 PM, 06/05/24 Lynwood Music, MPT Physical Therapist with [facility] 336 [phone] office [phone] mobile phone

## 2024-06-05 NOTE — Plan of Care (Signed)
  Problem: Acute Rehab OT Goals (only OT should resolve) Goal: Pt. Will Perform Grooming Flowsheets (Taken 06/05/2024 1212) Pt Will Perform Grooming:  with modified independence  standing Goal: Pt. Will Perform Lower Body Bathing Flowsheets (Taken 06/05/2024 1212) Pt Will Perform Lower Body Bathing: with modified independence Goal: Pt. Will Perform Lower Body Dressing Flowsheets (Taken 06/05/2024 1212) Pt Will Perform Lower Body Dressing: with modified independence Goal: Pt. Will Transfer To Toilet Flowsheets (Taken 06/05/2024 1212) Pt Will Transfer to Toilet:  with modified independence  ambulating Goal: Pt. Will Perform Toileting-Clothing Manipulation Flowsheets (Taken 06/05/2024 1212) Pt Will Perform Toileting - Clothing Manipulation and hygiene: with modified independence Goal: Pt/Caregiver Will Perform Home Exercise Program Flowsheets (Taken 06/05/2024 1212) Pt/caregiver will Perform Home Exercise Program:  Increased strength  Increased ROM  Both right and left upper extremity  Independently  Jeris Roser OT, MOT

## 2024-06-06 LAB — BLOOD GAS, VENOUS
Acid-base deficit: 0.2 mmol/L (ref 0.0–2.0)
Bicarbonate: 22 mmol/L (ref 20.0–28.0)
Drawn by: 7049
O2 Saturation: 79.4 %
Patient temperature: 38.8
pCO2, Ven: 29 mmHg — ABNORMAL LOW (ref 44–60)
pH, Ven: 7.49 — ABNORMAL HIGH (ref 7.25–7.43)
pO2, Ven: 51 mmHg — ABNORMAL HIGH (ref 32–45)

## 2024-06-06 LAB — BASIC METABOLIC PANEL WITH GFR
Anion gap: 11 (ref 5–15)
BUN: 18 mg/dL (ref 8–23)
CO2: 23 mmol/L (ref 22–32)
Calcium: 8.7 mg/dL — ABNORMAL LOW (ref 8.9–10.3)
Chloride: 102 mmol/L (ref 98–111)
Creatinine, Ser: 0.74 mg/dL (ref 0.61–1.24)
GFR, Estimated: >60 mL/min (ref >60)
Glucose, Bld: 100 mg/dL — ABNORMAL HIGH (ref 70–99)
Potassium: 3.9 mmol/L (ref 3.5–5.1)
Sodium: 136 mmol/L (ref 135–145)

## 2024-06-06 LAB — CBC WITH DIFFERENTIAL/PLATELET
Abs Immature Granulocytes: 0.04 K/uL (ref 0.00–0.07)
Basophils Absolute: 0 K/uL (ref 0.0–0.1)
Basophils Relative: 0 %
Eosinophils Absolute: 0.1 K/uL (ref 0.0–0.5)
Eosinophils Relative: 1 %
HCT: 29.1 % — ABNORMAL LOW (ref 39.0–52.0)
Hemoglobin: 9.2 g/dL — ABNORMAL LOW (ref 13.0–17.0)
Immature Granulocytes: 1 %
Lymphocytes Relative: 4 %
Lymphs Abs: 0.3 K/uL — ABNORMAL LOW (ref 0.7–4.0)
MCH: 29.2 pg (ref 26.0–34.0)
MCHC: 31.6 g/dL (ref 30.0–36.0)
MCV: 92.4 fL (ref 80.0–100.0)
Monocytes Absolute: 0.4 K/uL (ref 0.1–1.0)
Monocytes Relative: 5 %
Neutro Abs: 7.4 K/uL (ref 1.7–7.7)
Neutrophils Relative %: 89 %
Platelets: 248 K/uL (ref 150–400)
RBC: 3.15 MIL/uL — ABNORMAL LOW (ref 4.22–5.81)
RDW: 16.7 % — ABNORMAL HIGH (ref 11.5–15.5)
WBC: 8.2 K/uL (ref 4.0–10.5)
nRBC: 0 % (ref 0.0–0.2)

## 2024-06-06 LAB — LACTIC ACID, PLASMA
Lactic Acid, Venous: 2 mmol/L (ref 0.5–1.9)
Lactic Acid, Venous: 1.9 mmol/L (ref 0.5–1.9)

## 2024-06-06 LAB — MRSA NEXT GEN BY PCR, NASAL: MRSA by PCR Next Gen: NOT DETECTED

## 2024-06-06 MED ORDER — LIDOCAINE 5 % EX PTCH
1.0000 | MEDICATED_PATCH | CUTANEOUS | Status: DC
  Filled 2024-06-06: qty 1

## 2024-06-06 MED ORDER — IPRATROPIUM-ALBUTEROL 0.5-2.5 (3) MG/3ML IN SOLN
3.0000 mL | RESPIRATORY_TRACT | Status: AC
  Administered 2024-06-06: 3 mL via RESPIRATORY_TRACT
  Filled 2024-06-06: qty 3

## 2024-06-06 MED ORDER — KETOROLAC TROMETHAMINE 15 MG/ML IJ SOLN
INTRAMUSCULAR | Status: AC
  Administered 2024-06-06: 15 mg via INTRAVENOUS
  Filled 2024-06-06: qty 1

## 2024-06-06 MED ORDER — LIDOCAINE 5 % EX PTCH
2.0000 | MEDICATED_PATCH | CUTANEOUS | Status: AC
  Administered 2024-06-06: 2 via TRANSDERMAL

## 2024-06-06 MED ORDER — KETOROLAC TROMETHAMINE 15 MG/ML IJ SOLN
15.0000 mg | Freq: Once | INTRAMUSCULAR | Status: AC
  Administered 2024-06-06: 15 mg via INTRAVENOUS

## 2024-06-06 MED ORDER — FUROSEMIDE 10 MG/ML IJ SOLN: 20.0000 mg | INTRAMUSCULAR | Status: DC

## 2024-06-06 MED ORDER — SODIUM CHLORIDE 0.9 % IV SOLN
600.0000 mg | Freq: Once | INTRAVENOUS | Status: DC
  Filled 2024-06-06: qty 6

## 2024-06-06 MED ORDER — ACETAMINOPHEN 650 MG RE SUPP
650.0000 mg | RECTAL | Status: AC
  Administered 2024-06-06: 650 mg via RECTAL
  Filled 2024-06-06: qty 1

## 2024-06-06 MED ORDER — PIPERACILLIN-TAZOBACTAM 3.375 G IVPB
3.3750 g | Freq: Three times a day (TID) | INTRAVENOUS | Status: DC
  Administered 2024-06-06 – 2024-06-07 (×4): 3.375 g via INTRAVENOUS
  Filled 2024-06-06 (×4): qty 50

## 2024-06-06 MED ORDER — MIDODRINE HCL 5 MG PO TABS: 10.0000 mg | ORAL_TABLET | ORAL | Status: DC

## 2024-06-06 NOTE — Progress Notes (Signed)
 PROGRESS NOTE   Dean Randolph  FMW:[id number] DOB: [birth year] DOA: 06/02/2024 PCP: Davonna Siad, MD   Chief Complaint  Patient presents with   Cough   Level of care: Telemetry  Brief Admission History:  83 y.o. male with medical history significant of hypertension, history of bladder cancer with metastatic disease to lumbar and thoracic spine who was recently admitted from 11/29 to 12/4 due to acute metabolic encephalopathy due to small acute to early subacute bilateral occipital infarcts.  Daughter at bedside states that patient has had a mild cough prior to being discharged, this worsened (a day after being discharged) after eating a meal at home, cough was productive with yellowish-green sputum and it was associated with shortness of breath when he has a coughing fit.   ED course In the emergency department, patient was tachypneic, tachycardic, temperature was 97.5 F, O2 sat was 95 to 98% on 3 LPM of oxygen (patient does not use oxygen at baseline).  Workup in the ED showed normocytic anemia and leukocytosis.  BMP was normal except for blood glucose of 132 lactic acid 3.3 > 2.0.  Urinalysis was suspicious for UTI.  Urine culture was positive for Stenotrophomonas maltophilia.  Chest x-ray was suggestive of worsening edema or infection.  EKG personally reviewed which showed wide QRS tachycardia heart rate of 148 bpm with RBBB.  Patient was treated with IV vancomycin  and cefepime , IV hydration.    Assessment and Plan:  Aspiration pneumonia  -- sepsis physiology resolved however he re-aspirated overnight  -- continue aspiration precautions -- SLP evaluation s/p MBS -- dysphagia diet as ordered -- aspiration precautions -- adding chest PT and duonebs to help clear secretions if possible -- after MBS placed on dys 3 diet, thin liquids by SLP  -- he remains high risk for recurrent aspiration, multiple discussions with family -- after final discussion of the day today, they seem open to  taking him home with hospice and accepting of necessary hospice equipment like a hospital bed, they will discuss with palliative tomorrow   Lactic acidosis  -- resolved with treatments  Stenotrophomonas maltophilia UTI  -- agree with Bactrim  DS with plan to treat for 7 days given complicated UTI picture  Essential hypertension  --- BPs stable, follow  Urothelial carcinoma -- metastasis to bone and lymph nodes -- he hasn't been able to get his palliative radiation due to being hospitalized -- he is on chemo treatments per oncology -- goal of care is to resume chemo and radiation -- anticipating he will go home with hospice -- family requested oncology consult and [facility] reached out to me for permission and I was happy to have them consult and weigh in to give family more information   GERD  -- pantoprazole  for GI protection   Urinary retention  -- foley cath was changed on admission -- tamsulosin  daily ordered  Protein calorie malnutrition  -- agree with meal supplementation as ordered   Goal of care -- long discussion with family on 12/7 about patient's precipitous decline in last months especially after recent CVA. Per GOC discussion continue chemo/radiation treatments.  DVT prophylaxis: enoxaparin  Code Status: [id number] Family Communication: long meeting 12/7, 12/9 Disposition: anticipate home with hospice when necessary equipment is delivered    Consultants:  Palliative  Procedures:   Antimicrobials:  Bactrim  DS 12/6>>   Subjective: Pt had a severe aspiration episode last night after supper and has declined further since    Objective: Vitals:   06/06/24 0754 06/06/24  9092 06/06/24 1321 06/06/24 1338  BP:  (!) 92/51  (!) 101/47  Pulse:  (!) 111  (!) 108  Resp:  (!) 32  (!) 28  Temp:  99.7 F (37.6 C)  98.1 F (36.7 C)  TempSrc:  Rectal  Oral  SpO2: 92% 93% 93% 95%  Weight:      Height:        Intake/Output Summary (Last 24 hours) at 06/06/2024  1603 Last data filed at 06/06/2024 1130 Gross per 24 hour  Intake 150.92 ml  Output 550 ml  Net -399.08 ml   Filed Weights   06/02/24 2005  Weight: 63.5 kg   Examination:  General exam: Appears very ill, somnolent but arousable.   Respiratory system: cough and chest congestion.  Cardiovascular system: normal S1 & S2 heard. No JVD, murmurs, rubs, gallops or clicks. No pedal edema. Gastrointestinal system: Abdomen is nondistended, soft and nontender. No organomegaly or masses felt. Normal bowel sounds heard. Central nervous system: Alert and oriented. No focal neurological deficits. Extremities: Symmetric 5 x 5 power. Skin: No rashes, lesions or ulcers. Psychiatry: Judgement and insight appear unable to determine. Mood & affect appropriate.   Data Reviewed: I have personally reviewed following labs and imaging studies  CBC: Recent Labs  Lab 06/02/24 2022 06/03/24 0406 06/04/24 0450 06/05/24 0443 06/06/24 0421  WBC 14.1* 14.4* 11.8* 9.0 8.2  NEUTROABS 12.7*  --  10.0* 7.2 7.4  HGB 10.4* 8.7* 8.6* 8.3* 9.2*  HCT 33.3* 27.6* 28.0* 26.0* 29.1*  MCV 97.1 95.5 94.3 94.9 92.4  PLT 394 301 267 256 248    Basic Metabolic Panel: Recent Labs  Lab 06/02/24 2022 06/03/24 0406 06/04/24 0450 06/05/24 0443 06/06/24 0421  NA 136 137 136 135 136  K 3.9 3.4* 3.4* 3.3* 3.9  CL 98 103 103 103 102  CO2 26 25 24  19* 23  GLUCOSE 132* 108* 97 95 100*  BUN 22 22 19 18 18   CREATININE 0.93 0.81 0.76 0.78 0.74  CALCIUM  9.2 8.4* 8.6* 8.5* 8.7*  MG  --  2.0 1.9  --   --   PHOS  --  3.3  --   --   --     CBG: No results for input(s): GLUCAP in the last 168 hours.  Recent Results (from the past 240 hours)  MRSA Next Gen by PCR, Nasal     Status: None   Collection Time: 05/27/24  6:15 PM   Specimen: Nasal Mucosa; Nasal Swab  Result Value Ref Range Status   MRSA by PCR Next Gen NOT DETECTED NOT DETECTED Final    Comment: (NOTE) The GeneXpert MRSA Assay (FDA approved for NASAL  specimens only), is one component of a comprehensive MRSA colonization surveillance program. It is not intended to diagnose MRSA infection nor to guide or monitor treatment for MRSA infections. Test performance is not FDA approved in patients less than 56 years old. Performed at [facility], [address] [place], [place] [address]   Culture, blood (Routine x 2)     Status: None (Preliminary result)   Collection Time: 06/02/24  8:22 PM   Specimen: Right Antecubital; Blood  Result Value Ref Range Status   Specimen Description RIGHT ANTECUBITAL  Final   Special Requests   Final    BOTTLES DRAWN AEROBIC AND ANAEROBIC Blood Culture adequate volume   Culture   Final    NO GROWTH 4 DAYS Performed at [facility], [address], [place], [place] [address]  Report Status PENDING  Incomplete  Urine Culture     Status: Abnormal (Preliminary result)   Collection Time: 06/02/24  8:22 PM   Specimen: Urine, Random  Result Value Ref Range Status   Specimen Description   Final    URINE, RANDOM Performed at [facility], [address] [place], [place] [address]    Special Requests   Final    NONE Reflexed from [id number] Performed at [facility], [address] [place], [place] [address]    Culture (A)  Final    >=100,000 COLONIES/mL STENOTROPHOMONAS MALTOPHILIA Sent to [facility] for further susceptibility testing. Performed at [facility], [address], [place], [place] [address]    Report Status PENDING  Incomplete  Susceptibility, Aer + Anaerob     Status: None   Collection Time: 06/02/24  8:22 PM  Result Value Ref Range Status   Suscept, Aer + Anaerob PENDING  Incomplete   Source STENOTROPHOMONAS MALTOPHILIA/ URINE  Corrected    Comment: Performed at [facility], [address] [place], [place] [address] CORRECTED ON 12/08 AT 9155: PREVIOUSLY REPORTED AS URINE    Culture, blood (Routine x 2)     Status: None (Preliminary result)   Collection Time: 06/02/24  8:42 PM    Specimen: BLOOD RIGHT ARM  Result Value Ref Range Status   Specimen Description BLOOD RIGHT ARM  Final   Special Requests   Final    BOTTLES DRAWN AEROBIC AND ANAEROBIC Blood Culture adequate volume   Culture   Final    NO GROWTH 4 DAYS Performed at [facility], [address] [place], [place] [address]    Report Status PENDING  Incomplete  Culture, blood (Routine X 2) w Reflex to ID Panel     Status: None (Preliminary result)   Collection Time: 06/06/24  5:10 AM   Specimen: BLOOD  Result Value Ref Range Status   Specimen Description BLOOD RIGHT ANTECUBITAL  Final   Special Requests   Final    BOTTLES DRAWN AEROBIC AND ANAEROBIC Blood Culture adequate volume   Culture   Final    NO GROWTH <12 HOURS Performed at [facility], [address], [place], [place] [address]    Report Status PENDING  Incomplete  Culture, blood (Routine X 2) w Reflex to ID Panel     Status: None (Preliminary result)   Collection Time: 06/06/24  5:12 AM   Specimen: BLOOD  Result Value Ref Range Status   Specimen Description BLOOD LEFT ANTECUBITAL  Final   Special Requests   Final    BOTTLES DRAWN AEROBIC AND ANAEROBIC Blood Culture adequate volume   Culture   Final    NO GROWTH <12 HOURS Performed at [facility], [address], [place], [place] [address]    Report Status PENDING  Incomplete  MRSA Next Gen by PCR, Nasal     Status: None   Collection Time: 06/06/24  6:46 AM   Specimen: Nasal Mucosa; Nasal Swab  Result Value Ref Range Status   MRSA by PCR Next Gen NOT DETECTED NOT DETECTED Final    Comment: (NOTE) The GeneXpert MRSA Assay (FDA approved for NASAL specimens only), is one component of a comprehensive MRSA colonization surveillance program. It is not intended to diagnose MRSA infection nor to guide or monitor treatment for MRSA infections. Test performance is not FDA approved in patients less than 91 years old. Performed at [facility], [address], [place], [place] [address]  Radiology Studies: DG Swallowing Func-Speech Pathology Result Date: 06/05/2024 Table formatting from the original result was not included. Modified Barium Swallow Study Patient Details Name: Dean Randolph MRN: [id number] Date of Birth: 1940-09-02 Today's Date: 06/05/2024 HPI/PMH: HPI: 83 y.o. 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 He had recent admission 11/29-12/4 with findings of new stroke. Initial plan was to discharge to CIR, however, insurance declined to pay. He was discharged home with PT/OT and had quick return due to respiratory symptoms. BSE completed yesterday with recommendation for MBS. Clinical Impression: Clinical Impression: MBSS completed during acute stay and Pt was assessed in the lateral position with barium tinged thin, nectar, honey, puree, regular textures, and a barium tablet in puree. Pt presents with mi/mod oropharyngeal phase dysphagia with suspected esophageal component. Oral phase is marked by reduced labial closure and mild lingual weakness with residual post swallow; pharyngeal phase is marked by swallow trigger after filling the valleculae with thins, min reduced tonge base retraction and hyolaryngeal excursion, and incomplete epiglottic deflection resulting in residuals in the valleculae which then spill to the lateral channels and pyriforms, and trace penetration of thins after the swallow. Pt was noted to have aspiration of secretions mixed with barium after he consumes honey thick liquids followed by puree and regular textures. Pt with congested cough and phlegm tinged with barium was noted to fall beneath the vocal folds and then expelled with a cough and swallowed. The barium tablet was presented in puree and briefly stopped in the valleculae before traversiing the esophagus and delayed in what appeared to  be a hiatal hernia (no radiologist present to confirm). Min backflow of barium noted during esophageal sweep. Recommend D3/mech soft and thin liquids via cup/straw sips when Pt is alert and upright and swallow 2-3x for each bite/sip, pills whole in puree, and remain upright for ~30 minutes post meals for reflux precautions. SLP will follow during acute stay to review recommendations with Pt and family. Factors that may increase risk of adverse event in presence of aspiration Noe & Lianne 2021): Factors that may increase risk of adverse event in presence of aspiration Noe & Lianne 2021): Frail or deconditioned Recommendations/Plan: Swallowing Evaluation Recommendations Swallowing Evaluation Recommendations Recommendations: PO diet PO Diet Recommendation: Dysphagia 3 (Mechanical soft); Thin liquids (Level 0) Liquid Administration via: Straw; Cup Medication Administration: Whole meds with puree Supervision: Patient able to self-feed; Intermittent supervision/cueing for swallowing strategies Swallowing strategies  : Slow rate; Small bites/sips; Multiple dry swallows after each bite/sip Postural changes: Position pt fully upright for meals; Stay upright 30-60 min after meals Oral care recommendations: Oral care BID (2x/day); Staff/trained caregiver to provide oral care Treatment Plan Treatment Plan Treatment recommendations: Therapy as outlined in treatment plan below Follow-up recommendations: Follow physicians's recommendations for discharge plan and follow up therapies Functional status assessment: Patient has had a recent decline in their functional status and demonstrates the ability to make significant improvements in function in a reasonable and predictable amount of time. Treatment frequency: Min 2x/week Treatment duration: 1 week Interventions: Aspiration precaution training; Compensatory techniques; Patient/family education; Diet toleration management by SLP Recommendations Recommendations for follow  up therapy are one component of a multi-disciplinary discharge planning process, led by the attending physician.  Recommendations may be updated based on patient status, additional functional criteria and insurance authorization. Assessment: Orofacial Exam: Orofacial Exam Oral Cavity: Oral Hygiene:  WFL Oral Cavity - Dentition: Missing dentition Orofacial Anatomy: WFL Oral Motor/Sensory Function: WFL Anatomy: Anatomy: WFL Boluses Administered: Boluses Administered Boluses Administered: Thin liquids (Level 0); Mildly thick liquids (Level 2, nectar thick); Moderately thick liquids (Level 3, honey thick); Puree; Solid  Oral Impairment Domain: Oral Impairment Domain Lip Closure: Escape progressing to mid-chin Tongue control during bolus hold: Escape to lateral buccal cavity/floor of mouth Bolus preparation/mastication: Slow prolonged chewing/mashing with complete recollection Bolus transport/lingual motion: Delayed initiation of tongue motion (oral holding) Oral residue: Trace residue lining oral structures Location of oral residue : Tongue Initiation of pharyngeal swallow : Valleculae  Pharyngeal Impairment Domain: Pharyngeal Impairment Domain Soft palate elevation: Trace column of contrast or air between SP and PW Laryngeal elevation: Partial superior movement of thyroid  cartilage/partial approximation of arytenoids to epiglottic petiole Anterior hyoid excursion: Partial anterior movement Epiglottic movement: Partial inversion Laryngeal vestibule closure: Incomplete, narrow column air/contrast in laryngeal vestibule Pharyngeal stripping wave : Present - complete Pharyngeal contraction (A/P view only): N/A Pharyngoesophageal segment opening: Complete distension and complete duration, no obstruction of flow Tongue base retraction: Narrow column of contrast or air between tongue base and PPW Pharyngeal residue: Collection of residue within or on pharyngeal structures Location of pharyngeal residue: Valleculae; Pyriform  sinuses; Tongue base  Esophageal Impairment Domain: Esophageal Impairment Domain Esophageal clearance upright position: Esophageal retention with retrograde flow below pharyngoesophageal segment (PES) Pill: Pill Consistency administered: Puree Puree: Impaired (see clinical impressions) Penetration/Aspiration Scale Score: Penetration/Aspiration Scale Score 1.  Material does not enter airway: Puree; Solid; Pill; Mildly thick liquids (Level 2, nectar thick) 3.  Material enters airway, remains ABOVE vocal cords and not ejected out: Thin liquids (Level 0) 6.  Material enters airway, passes BELOW cords then ejected out: Moderately thick liquids (Level 3, honey thick) (see impression statement- mixed with secretions/phlegm) Compensatory Strategies: Compensatory Strategies Compensatory strategies: Yes Multiple swallows: Effective Effective Multiple Swallows: Thin liquid (Level 0); Mildly thick liquid (Level 2, nectar thick); Moderately thick liquid (Level 3, honey thick); Puree   General Information: Caregiver present: No  Diet Prior to this Study: Dysphagia 2 (finely chopped); Thin liquids (Level 0)   Temperature : Normal   Respiratory Status: WFL   Supplemental O2: None (Room air)   History of Recent Intubation: No  Behavior/Cognition: Alert; Cooperative Self-Feeding Abilities: Able to self-feed Baseline vocal quality/speech: Normal Volitional Cough: Able to elicit Volitional Swallow: Able to elicit Exam Limitations: No limitations (some limitations with shoulder) Goal Planning: Prognosis for improved oropharyngeal function: Good No data recorded No data recorded Patient/Family Stated Goal: return home healthy Consulted and agree with results and recommendations: Patient Pain: Pain Assessment Pain Assessment: No/denies pain End of Session: Start Time:SLP Start Time (ACUTE ONLY): 1312 Stop Time: SLP Stop Time (ACUTE ONLY): 1342 Time Calculation:SLP Time Calculation (min) (ACUTE ONLY): 30 min Charges: SLP Evaluations $ SLP  Speech Visit: 1 Visit SLP Evaluations $BSS Swallow: 1 Procedure $MBS Swallow: 1 Procedure SLP visit diagnosis: SLP Visit Diagnosis: Dysphagia, oropharyngeal phase (R13.12) Past Medical History: Past Medical History: Diagnosis Date  Bladder cancer (HCC)   Hypertension  Past Surgical History: Past Surgical History: Procedure Laterality Date  BILIARY DILATION  03/12/2022  Procedure: BILIARY DILATION;  Surgeon: Mansouraty, Aloha Raddle., MD;  Location: [facility];  Service: Gastroenterology;;  BILIARY STENT PLACEMENT N/A 02/06/2021  Procedure: BILIARY STENT PLACEMENT;  Surgeon: Golda Claudis PENNER, MD;  Location: [facility];  Service: Gastroenterology;  Laterality: N/A;  BILIARY STENT PLACEMENT N/A 03/26/2021  Procedure: BILIARY STENT PLACEMENT 10 FRENCH BY  9cm;  Surgeon: Golda Claudis PENNER, MD;  Location: [facility];  Service: Endoscopy;  Laterality: N/A;  BILIARY STENT PLACEMENT N/A 01/19/2022  Procedure: BILIARY STENT PLACEMENT;  Surgeon: Wilhelmenia Aloha Raddle., MD;  Location: [facility];  Service: Gastroenterology;  Laterality: N/A;  BIOPSY  01/19/2022  Procedure: BIOPSY;  Surgeon: Wilhelmenia Aloha Raddle., MD;  Location: [facility];  Service: Gastroenterology;;  CATARACT EXTRACTION Bilateral   CHOLECYSTECTOMY N/A 04/25/2021  Procedure: LAPAROSCOPIC CHOLECYSTECTOMY;  Surgeon: Mavis Anes, MD;  Location: [facility];  Service: General;  Laterality: N/A;  ENDOSCOPIC RETROGRADE CHOLANGIOPANCREATOGRAPHY (ERCP) WITH PROPOFOL  N/A 01/19/2022  Procedure: ENDOSCOPIC RETROGRADE CHOLANGIOPANCREATOGRAPHY (ERCP) WITH PROPOFOL ;  Surgeon: Wilhelmenia Aloha Raddle., MD;  Location: [facility];  Service: Gastroenterology;  Laterality: N/A;  ENDOSCOPIC RETROGRADE CHOLANGIOPANCREATOGRAPHY (ERCP) WITH PROPOFOL  N/A 03/12/2022  Procedure: ENDOSCOPIC RETROGRADE CHOLANGIOPANCREATOGRAPHY (ERCP) WITH PROPOFOL ;  Surgeon: Wilhelmenia Aloha Raddle., MD;  Location: [facility];  Service: Gastroenterology;  Laterality: N/A;  ERCP N/A 02/06/2021  Procedure:  ENDOSCOPIC RETROGRADE CHOLANGIOPANCREATOGRAPHY (ERCP);  Surgeon: Golda Claudis PENNER, MD;  Location: [facility];  Service: Gastroenterology;  Laterality: N/A;  ERCP N/A 03/26/2021  Procedure: ENDOSCOPIC RETROGRADE CHOLANGIOPANCREATOGRAPHY (ERCP) with EXTENDED SPHINCTEROTOMY;  Surgeon: Golda Claudis PENNER, MD;  Location: [facility];  Service: Endoscopy;  Laterality: N/A;  ERCP N/A 10/27/2021  Procedure: ENDOSCOPIC RETROGRADE CHOLANGIOPANCREATOGRAPHY (ERCP);  Surgeon: Golda Claudis PENNER, MD;  Location: [facility];  Service: Endoscopy;  Laterality: N/A;  no site   GASTROINTESTINAL STENT REMOVAL N/A 03/26/2021  Procedure: GASTROINTESTINAL STENT REMOVAL;  Surgeon: Golda Claudis PENNER, MD;  Location: [facility];  Service: Endoscopy;  Laterality: N/A;  GASTROINTESTINAL STENT REMOVAL N/A 10/27/2021  Procedure: GASTROINTESTINAL STENT EXCHANGE;  Surgeon: Golda Claudis PENNER, MD;  Location: [facility];  Service: Endoscopy;  Laterality: N/A;  no site   REMOVAL OF STONES N/A 03/26/2021  Procedure: REMOVAL OF STONES WITH EXTRACTION BASKET;  Surgeon: Golda Claudis PENNER, MD;  Location: [facility];  Service: Endoscopy;  Laterality: N/A;  REMOVAL OF STONES N/A 10/27/2021  Procedure: REMOVAL OF STONES;  Surgeon: Golda Claudis PENNER, MD;  Location: [facility];  Service: Endoscopy;  Laterality: N/A;  REMOVAL OF STONES  01/19/2022  Procedure: REMOVAL OF STONES;  Surgeon: Wilhelmenia Aloha Raddle., MD;  Location: [facility];  Service: Gastroenterology;;  REMOVAL OF STONES  03/12/2022  Procedure: REMOVAL OF STONES;  Surgeon: Wilhelmenia Aloha Raddle., MD;  Location: [facility];  Service: Gastroenterology;;  ANNETT N/A 02/06/2021  Procedure: ANNETT;  Surgeon: Golda Claudis PENNER, MD;  Location: [facility];  Service: Gastroenterology;  Laterality: N/A;  SPHINCTEROTOMY N/A 10/27/2021  Procedure: SPHINCTEROTOMY;  Surgeon: Golda Claudis PENNER, MD;  Location: [facility];  Service: Endoscopy;  Laterality: N/A;  SPYGLASS CHOLANGIOSCOPY N/A 01/19/2022  Procedure: SPYGLASS CHOLANGIOSCOPY;   Surgeon: Wilhelmenia Aloha Raddle., MD;  Location: [facility];  Service: Gastroenterology;  Laterality: N/A;  SPYGLASS CHOLANGIOSCOPY N/A 03/12/2022  Procedure: DEBHOJDD CHOLANGIOSCOPY;  Surgeon: Wilhelmenia Aloha Raddle., MD;  Location: [facility];  Service: Gastroenterology;  Laterality: N/A;  SPYGLASS LITHOTRIPSY N/A 03/12/2022  Procedure: DEBHOJDD LITHOTRIPSY;  Surgeon: Wilhelmenia Aloha Raddle., MD;  Location: [facility];  Service: Gastroenterology;  Laterality: N/A;  STENT REMOVAL  01/19/2022  Procedure: STENT REMOVAL;  Surgeon: Wilhelmenia Aloha Raddle., MD;  Location: [facility];  Service: Gastroenterology;;  CLEDA REMOVAL  03/12/2022  Procedure: STENT REMOVAL;  Surgeon: Wilhelmenia Aloha Raddle., MD;  Location: [facility];  Service: Gastroenterology;;  STONE EXTRACTION WITH BASKET  01/19/2022  Procedure: STONE EXTRACTION WITH BASKET;  Surgeon: Wilhelmenia Aloha Raddle., MD;  Location: [facility];  Service: Gastroenterology;; Thank  you, Lamar Candy, CCC-SLP [phone] PORTER,DABNEY 06/05/2024, 2:46 PM  DG CHEST PORT 1 VIEW Result Date: 06/05/2024 CLINICAL DATA:  Aspiration pneumonia. EXAM: PORTABLE CHEST 1 VIEW COMPARISON:  06/02/2024 FINDINGS: Cardiopericardial silhouette is at upper limits of normal for size. Interstitial with diffuse patchy bilateral airspace disease is similar to prior. No substantial pleural effusion. Telemetry leads overlie the chest. IMPRESSION: No substantial interval change. Diffuse patchy bilateral airspace disease compatible with asymmetric edema or diffuse infection. Electronically Signed   By: Camellia Candle M.D.   On: 06/05/2024 05:47   Scheduled Meds:  aspirin  EC  81 mg Oral Daily   atorvastatin   20 mg Oral QPM   Chlorhexidine  Gluconate Cloth  6 each Topical Q0600   clopidogrel   75 mg Oral Daily   dextromethorphan   30 mg Oral BID   enoxaparin  (LOVENOX ) injection  40 mg Subcutaneous Q24H   famotidine   20 mg Oral QHS   feeding supplement  237 mL Oral BID BM    guaiFENesin   10 mL Oral TID   ipratropium-albuterol   3 mL Nebulization TID   lidocaine   2 patch Transdermal STAT   mirtazapine   15 mg Oral QHS   pantoprazole   40 mg Oral Daily   sulfamethoxazole -trimethoprim   1 tablet Oral Q12H   tamsulosin   0.4 mg Oral Daily   Continuous Infusions:  piperacillin -tazobactam (ZOSYN )  IV 3.375 g (06/06/24 1201)     LOS: 4 days   Time spent: 55 mins  Alejandria Wessells Vicci, MD How to contact the [facility] Attending or Consulting provider 7A - 7P or covering provider during after hours 7P -7A, for this patient?  Check the care team in [facility] and look for a) attending/consulting TRH provider listed and b) the TRH team listed Log into [email or web address] to find provider on call.  Locate the TRH provider you are looking for under [facility] and page to a number that you can be directly reached. If you still have difficulty reaching the provider, please page the [facility] (Director on Call) for the Hospitalists listed on amion for assistance.  06/06/2024, 4:03 PM

## 2024-06-06 NOTE — Progress Notes (Signed)
 Daily Progress Note   Patient Name: Dean Randolph       Date: 06/06/2024 DOB: 03/10/1941  Age: 83 y.o. MRN#: [id number] Attending Physician: Vicci Afton CROME, MD Primary Care Physician: Davonna Siad, MD Admit Date: 06/02/2024  Reason for Consultation/Follow-up: Establishing goals of care  Patient Profile/HPI:  83 y.o. 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 He had recent admission 11/29-12/4 with findings of new stroke. Initial plan was to discharge to CIR, however, insurance declined to pay. He was discharged home with PT/OT and had quick return due to respiratory symptoms. Palliative medicine consulted for goals of care.    Subjective: Chart reviewed including labs, progress notes, imaging from this and previous encounters.  CBC with normal WBC, lactic acid persists in elevation 2.0. MBS reviewed- had some aspiration of secretions, trace penetration of thins. Recommendations for D3 diet, thin liquids, aspiration precautions.  Per notes, appeared to have aspiration event last night, spiking fevers today.  Case discussed with attending MD and bedside RN.  Met with patient, his daughter and spouse at bedside. Discussed aspiration event. Likelihood that he is constantly aspirating causing persistent pneumonia and inflammation.  Discussed continued aggressive interventions.  Patient expressed he would not want a feeding tube.  Reviewed option for comfort and hospice. Discussed transition to comfort measures only which includes stopping IV fluids, antibiotics, labs and providing symptom management for SOB, anxiety, nausea,  vomiting, and other symptoms of dying.  Hospice services and philosophy of care reviewed.  Family would like time to process information and discuss options among themselves. They are awaiting a visit from attending MD as well for further discussion.   Review of Systems  Constitutional:  Positive for malaise/fatigue.  Respiratory:  Positive for cough.      Physical Exam Vitals and nursing note reviewed.  Constitutional:      Appearance: He is ill-appearing.     Comments: frail  Cardiovascular:     Rate and Rhythm: Tachycardia present.  Pulmonary:     Effort: Pulmonary effort is normal.     Comments: Wet cough Skin:    Coloration: Skin is pale.  Neurological:     Mental Status: He is alert  and oriented to person, place, and time.             Vital Signs: BP (!) 92/51   Pulse (!) 111   Temp 99.7 F (37.6 C) (Rectal)   Resp (!) 32   Ht 5' 7 (1.702 m)   Wt 63.5 kg   SpO2 93%   BMI 21.93 kg/m  SpO2: SpO2: 93 % O2 Device: O2 Device: Room Air O2 Flow Rate: O2 Flow Rate (L/min): 1 L/min  Intake/output summary:  Intake/Output Summary (Last 24 hours) at 06/06/2024 1300 Last data filed at 06/06/2024 1130 Gross per 24 hour  Intake 435.73 ml  Output 550 ml  Net -114.27 ml   LBM: Last BM Date : 06/06/24 Baseline Weight: Weight: 63.5 kg Most recent weight: Weight: 63.5 kg       Palliative Assessment/Data: PPS: 30%      Patient Active Problem List   Diagnosis Date Noted   Aspiration pneumonia (HCC) 06/03/2024   Failure to thrive in adult 06/03/2024   HCAP (healthcare-associated pneumonia) 06/02/2024   Encephalopathy acute 05/27/2024   Stroke (HCC) 05/27/2024   Protein-calorie malnutrition, severe 05/03/2024   Sepsis due to pneumonia (HCC) 05/01/2024   Urothelial carcinoma (HCC) 04/04/2024   Renal mass, left 03/23/2024   Back pain 03/23/2024   Elevated alkaline phosphatase level 04/23/2022   History of ERCP 04/23/2022   Colon cancer screening 04/23/2022    Calculus of gallbladder without cholecystitis without obstruction    Acute cholangitis (HCC) 02/09/2021   Pancreatitis due to biliary obstruction/S/p ERCP with stent Placement on 02/06/21 02/09/2021   Essential hypertension 02/07/2021   Cholangitis (HCC)    Choledocholithiasis/Cholelithiasis--S/p ERCP with Plastic Stent Placement on 02/06/21 02/04/2021   Hyperglycemia 02/04/2021   Intermittent abdominal pain 02/04/2021   Early satiety 02/04/2021   Abnormal weight loss 02/04/2021   Hyperbilirubinemia 02/04/2021   Elevated liver enzymes 02/04/2021   Hematemesis 02/04/2021   Iron deficiency anemia    Transaminitis     Palliative Care Assessment & Plan    Assessment/Recommendations/Plan  Pneumonia- likely aspiration, in setting of history of stroke, and stage IV urothelia cancer- appears to be worsening, family considering option for comfort/hospice   Code Status:   Code Status: Limited: Do not attempt resuscitation (DNR) -DNR-LIMITED -Do Not Intubate/DNI    Prognosis:  Unable to determine  Discharge Planning: To Be Determined  Care plan was discussed with patient, family, and care team.   Thank you for allowing the Palliative Medicine Team to assist in the care of this patient.  Total time:  60 minutes Prolonged billing:  Time includes:   Preparing to see the patient (e.g., review of tests) Obtaining and/or reviewing separately obtained history Performing a medically necessary appropriate examination and/or evaluation Counseling and educating the patient/family/caregiver Ordering medications, tests, or procedures Referring and communicating with other health care professionals (when not reported separately) Documenting clinical information in the electronic or other health record Independently interpreting results (not reported separately) and communicating results to the patient/family/caregiver Care coordination (not reported separately) Clinical documentation  Cassondra Stain, AGNP-C Palliative Medicine   Please contact Palliative Medicine Team phone at [phone] for questions and concerns.

## 2024-06-06 NOTE — Progress Notes (Addendum)
 Cross coverage TRH, hospitalist service.  Was notified by bedside RN regarding the patient being febrile with tachycardia and labored breathing.  Presented at bedside, the patient is alert and responding to questions appropriately.  Has chills.  Temperature 101.9 with tachycardia 124.  Has not yet received Tylenol  (p.o./rectal).  Endorses pain in his right scapula.  Vomited earlier, therefore high risk for BIPAP.  2 patches of lidocaine  ordered.  On lungs auscultation, noted mild diffuse wheezing.  Bronchodilator nebulizers, DuoNeb x 1 ordered to be administered-levalbuterol nebs were not available.  O2 saturation 93% on room air.  Chest x-ray done on 06/05/2024 revealed no substantial interval change.  Diffuse patchy bilateral airspace disease compatible with asymmetric edema or diffuse infection, personally reviewed.  Last proBNP within normal limit, 215, on 06/02/2024.  The patient is on p.o. Bactrim  twice daily.  Peripheral blood cultures x 2, lactic acid, and venous blood gas added to patient's morning lab work.  Will continue to closely monitor and treat as indicated.   Time: 15 minutes.  Addendum: The patient spiked another temperature 102.5.  Due to concern for possible aspiration pneumonia, Zosyn  was added.  MRSA screening test is pending.

## 2024-06-06 NOTE — Progress Notes (Signed)
   06/05/24 2357  Respiratory  Respiratory Pattern Labored  Bilateral Breath Sounds Rales  Cough Productive;Weak  Respiratory (WDL) X  Sputum Amount Moderate  Sputum Color White  Sputum Consistency Thick  Respiratory Interventions Oral suction   pt called out c/o SHOB d/t cough. When I entered the room he was covered in what appeared to be stomach contents but pt reports he did NOT vomit, instead coughed them up. MBS showed probable hiatal hernia, but unable to verify d/t radiologist not present (per SLP). Pt suctioned orally with thick white mucous returned, but his lung sounds are now rales vs clear/diminished. O2 sat is stable on RA. MD notified.

## 2024-06-06 NOTE — Progress Notes (Signed)
 OT Cancellation Note  Patient Details Name: Dean Randolph MRN: [id number] DOB: 1941/01/22   Cancelled Treatment:    Reason Eval/Treat Not Completed: Fatigue/lethargy limiting ability to participate. Pt reportedly had a rough night and was not interested in therapy today. Will attempt treatment later as time permits.  Aalani Aikens OT, MOT  Jayson Person 06/06/2024, 2:48 PM

## 2024-06-06 NOTE — Plan of Care (Signed)

## 2024-06-07 DIAGNOSIS — A419 Sepsis, unspecified organism: Secondary | ICD-10-CM | POA: Diagnosis not present

## 2024-06-07 DIAGNOSIS — C689 Malignant neoplasm of urinary organ, unspecified: Secondary | ICD-10-CM | POA: Diagnosis not present

## 2024-06-07 LAB — BASIC METABOLIC PANEL WITH GFR
Anion gap: 11 (ref 5–15)
BUN: 20 mg/dL (ref 8–23)
CO2: 24 mmol/L (ref 22–32)
Calcium: 8.8 mg/dL — ABNORMAL LOW (ref 8.9–10.3)
Chloride: 103 mmol/L (ref 98–111)
Creatinine, Ser: 0.94 mg/dL (ref 0.61–1.24)
GFR, Estimated: >60 mL/min (ref >60)
Glucose, Bld: 89 mg/dL (ref 70–99)
Potassium: 3.4 mmol/L — ABNORMAL LOW (ref 3.5–5.1)
Sodium: 138 mmol/L (ref 135–145)

## 2024-06-07 LAB — CULTURE, BLOOD (ROUTINE X 2)
Culture: NO GROWTH
Culture: NO GROWTH
Special Requests: ADEQUATE
Special Requests: ADEQUATE

## 2024-06-07 LAB — CBC WITH DIFFERENTIAL/PLATELET
Abs Immature Granulocytes: 0.06 K/uL (ref 0.00–0.07)
Basophils Absolute: 0 K/uL (ref 0.0–0.1)
Basophils Relative: 0 %
Eosinophils Absolute: 0.1 K/uL (ref 0.0–0.5)
Eosinophils Relative: 1 %
HCT: 29.7 % — ABNORMAL LOW (ref 39.0–52.0)
Hemoglobin: 9.2 g/dL — ABNORMAL LOW (ref 13.0–17.0)
Immature Granulocytes: 1 %
Lymphocytes Relative: 6 %
Lymphs Abs: 0.6 K/uL — ABNORMAL LOW (ref 0.7–4.0)
MCH: 29.1 pg (ref 26.0–34.0)
MCHC: 31 g/dL (ref 30.0–36.0)
MCV: 94 fL (ref 80.0–100.0)
Monocytes Absolute: 0.6 K/uL (ref 0.1–1.0)
Monocytes Relative: 6 %
Neutro Abs: 8.2 K/uL — ABNORMAL HIGH (ref 1.7–7.7)
Neutrophils Relative %: 86 %
Platelets: 231 K/uL (ref 150–400)
RBC: 3.16 MIL/uL — ABNORMAL LOW (ref 4.22–5.81)
RDW: 16.8 % — ABNORMAL HIGH (ref 11.5–15.5)
WBC: 9.6 K/uL (ref 4.0–10.5)
nRBC: 0 % (ref 0.0–0.2)

## 2024-06-07 MED ORDER — GLYCOPYRROLATE 0.2 MG/ML IJ SOLN
0.2000 mg | INTRAMUSCULAR | Status: DC | PRN
  Administered 2024-06-08 (×5): 0.2 mg via INTRAVENOUS
  Filled 2024-06-07 (×4): qty 1

## 2024-06-07 MED ORDER — LORAZEPAM 2 MG/ML PO CONC: 1.0000 mg | ORAL | Status: DC | PRN

## 2024-06-07 MED ORDER — GLYCOPYRROLATE 0.2 MG/ML IJ SOLN
0.2000 mg | INTRAMUSCULAR | Status: DC | PRN
  Filled 2024-06-07: qty 1

## 2024-06-07 MED ORDER — HALOPERIDOL LACTATE 5 MG/ML IJ SOLN
0.5000 mg | INTRAMUSCULAR | Status: DC | PRN
  Administered 2024-06-07 – 2024-06-08 (×6): 0.5 mg via INTRAVENOUS
  Filled 2024-06-07 (×6): qty 1

## 2024-06-07 MED ORDER — GLYCOPYRROLATE 1 MG PO TABS: 1.0000 mg | ORAL_TABLET | ORAL | Status: DC | PRN

## 2024-06-07 MED ORDER — MORPHINE SULFATE (CONCENTRATE) 10 MG /0.5 ML PO SOLN: 5.0000 mg | ORAL | Status: DC | PRN

## 2024-06-07 MED ORDER — LORAZEPAM 2 MG/ML IJ SOLN
1.0000 mg | INTRAMUSCULAR | Status: DC | PRN
  Administered 2024-06-07 – 2024-06-08 (×4): 1 mg via INTRAVENOUS
  Filled 2024-06-07 (×4): qty 1

## 2024-06-07 MED ORDER — MORPHINE 100MG IN NS 100ML (1MG/ML) PREMIX INFUSION
3.0000 mg/h | INTRAVENOUS | Status: DC
  Administered 2024-06-07: 1 mg/h via INTRAVENOUS
  Filled 2024-06-07: qty 100

## 2024-06-07 MED ORDER — MORPHINE SULFATE (PF) 2 MG/ML IV SOLN
2.0000 mg | Freq: Once | INTRAVENOUS | Status: AC | PRN
  Administered 2024-06-07: 2 mg via INTRAVENOUS
  Filled 2024-06-07: qty 1

## 2024-06-07 MED ORDER — POLYVINYL ALCOHOL 1.4 % OP SOLN: 1.0000 [drp] | Freq: Four times a day (QID) | OPHTHALMIC | Status: DC | PRN

## 2024-06-07 MED ORDER — MORPHINE SULFATE (CONCENTRATE) 10 MG /0.5 ML PO SOLN
5.0000 mg | ORAL | Status: DC | PRN
  Administered 2024-06-07: 5 mg via SUBLINGUAL
  Filled 2024-06-07: qty 0.5

## 2024-06-07 MED ORDER — HALOPERIDOL 0.5 MG PO TABS: 0.5000 mg | ORAL_TABLET | ORAL | Status: DC | PRN

## 2024-06-07 MED ORDER — HALOPERIDOL LACTATE 2 MG/ML PO CONC: 0.5000 mg | ORAL | Status: DC | PRN

## 2024-06-07 MED ORDER — LORAZEPAM 1 MG PO TABS: 1.0000 mg | ORAL_TABLET | ORAL | Status: DC | PRN

## 2024-06-07 NOTE — Consult Note (Signed)
 [facility] Consultation Hematology/Oncology  CONSULTING PHYSICIAN: Dr. Vicci  REASON FOR CONSULT: Stage IV urothelial cancer with mets to the spine.    HISTORY OF PRESENT ILLNESS:   Dean Randolph is a 83 y.o. male with past medical history of stage IV urothelial cancer with mets to the spine being treated with EV plus pembrolizumab  last infusion on 05/15/2024.  Patient was recently admitted on 06/02/2024 with pneumonia due to aspiration.  Prior to that, he was admitted from 05/27/24-06/01/2024 with findings of a stroke.  Initial plan was to discharge to CIR unfortunately, insurance declined to pay.  He was discharged home with PT OT but developed respiratory symptoms and was brought back.  He is currently being treated for pneumonia but continues to aspirate.  It was recommended his diet to consist of D3 diet thin liquids with aspiration precaution.  Of note, appeared to have aspiration event daily since his admission.  Family and patient met with palliative care yesterday who discussed his wishes.  Patient reports he did not want a feeding tube but was not interested in comfort and hospice care at this time.  He is currently a DNR.  He is currently receiving palliative chemo radiation for stage IV bladder cancer with mets to the bone.  This was started on 04/17/2024.  He has only received 1 cycle at full dose which was then reduced/pushed out secondary to pneumonia, UTI and sepsis.  Today, I met with patient's daughter and wife in the hospital room.  Patient appears comfortable.  Earlier today, they spoke with palliative/hospice and have decided to bring him home with hospice.  Reports a hospital bed will be delivered tomorrow.  MEDICATIONS: I have reviewed the patient's current medications.      PERFORMANCE STATUS: The patient's performance status is 3 - Symptomatic, >50% confined to bed  PHYSICAL EXAM: Most Recent Vital Signs: Blood pressure (!) 134/103, pulse 92, temperature  97.8 F (36.6 C), temperature source Oral, resp. rate 12, height 6' (1.829 m), weight 270 lb (122.5 kg), SpO2 91%.  GENERAL:alert, no distress and comfortable SKIN: skin color, texture, turgor are normal, no rashes or significant lesions EYES: normal, conjunctiva are pink and non-injected, sclera clear OROPHARYNX:no exudate, no erythema and lips, buccal mucosa, and tongue normal  NECK: supple, thyroid  normal size, non-tender, without nodularity LYMPH:  no palpable lymphadenopathy in the cervical, axillary or inguinal LUNGS: clear to auscultation and percussion with normal breathing effort HEART: regular rate & rhythm and no murmurs and no lower extremity edema ABDOMEN:abdomen soft, non-tender and normal bowel sounds Musculoskeletal:no cyanosis of digits and no clubbing  PSYCH: alert & oriented x 3 with fluent speech NEURO: no focal motor/sensory deficits    LABORATORY DATA:   Last CBC Lab Results  Component Value Date   WBC 9.6 06/07/2024   HGB 9.2 (L) 06/07/2024   HCT 29.7 (L) 06/07/2024   MCV 94.0 06/07/2024   MCH 29.1 06/07/2024   RDW 16.8 (H) 06/07/2024   PLT 231 06/07/2024     Last metabolic panel Lab Results  Component Value Date   GLUCOSE 89 06/07/2024   NA 138 06/07/2024   K 3.4 (L) 06/07/2024   CL 103 06/07/2024   CO2 24 06/07/2024   BUN 20 06/07/2024   CREATININE 0.94 06/07/2024   GFRNONAA >60 06/07/2024   CALCIUM  8.8 (L) 06/07/2024   PHOS 3.3 06/03/2024   PROT 6.0 (L) 06/03/2024   ALBUMIN 2.7 (L) 06/03/2024   LABGLOB 4.6 (H) 12/02/2020   AGRATIO  0.7 (L) 12/02/2020   BILITOT 0.4 06/03/2024   ALKPHOS 158 (H) 06/03/2024   AST 32 06/03/2024   ALT 25 06/03/2024   ANIONGAP 11 06/07/2024      RADIOGRAPHY: DG Swallowing Func-Speech Pathology Table formatting from the original result was not included. Modified Barium Swallow Study  Patient Details  Name: Dean Randolph MRN: [id number] Date of Birth: Oct 09, 1940  Today's Date:  06/05/2024  HPI/PMH: HPI: 83 y.o. male  with past medical history of stage IV urothelial cancer  with mets to spine- being treated with EV+pembrolizumab - last treatment  11/17, admitted on 06/02/2024 with pneumonia- possibly due to aspiration.  He had recent admission 11/29-12/4 with findings of new stroke. Initial  plan was to discharge to CIR, however, insurance declined to pay. He was  discharged home with PT/OT and had quick return due to respiratory  symptoms. BSE completed yesterday with recommendation for MBS.  Clinical Impression: Clinical Impression: MBSS completed during acute stay and Pt was assessed  in the lateral position with barium tinged thin, nectar, honey, puree,  regular textures, and a barium tablet in puree. Pt presents with mi/mod  oropharyngeal phase dysphagia with suspected esophageal component. Oral  phase is marked by reduced labial closure and mild lingual weakness with  residual post swallow; pharyngeal phase is marked by swallow trigger after  filling the valleculae with thins, min reduced tonge base retraction and  hyolaryngeal excursion, and incomplete epiglottic deflection resulting in  residuals in the valleculae which then spill to the lateral channels and  pyriforms, and trace penetration of thins after the swallow. Pt was noted  to have aspiration of secretions mixed with barium after he consumes honey  thick liquids followed by puree and regular textures. Pt with congested  cough and phlegm tinged with barium was noted to fall beneath the vocal  folds and then expelled with a cough and swallowed. The barium tablet was  presented in puree and briefly stopped in the valleculae before  traversiing the esophagus and delayed in what appeared to be a hiatal  hernia (no radiologist present to confirm). Min backflow of barium noted  during esophageal sweep. Recommend D3/mech soft and thin liquids via  cup/straw sips when Pt is alert and upright and swallow  2-3x for each  bite/sip, pills whole in puree, and remain upright for ~30 minutes post  meals for reflux precautions. SLP will follow during acute stay to review  recommendations with Pt and family.  Factors that may increase risk of adverse event in presence of aspiration  Noe & Lianne 2021): Factors that may increase risk of adverse event  in presence of aspiration Noe & Lianne 2021): Frail or deconditioned  Recommendations/Plan: Swallowing Evaluation Recommendations Swallowing Evaluation Recommendations Recommendations: PO diet PO Diet Recommendation: Dysphagia 3 (Mechanical soft); Thin liquids (Level  0) Liquid Administration via: Straw; Cup Medication Administration: Whole meds with puree Supervision: Patient able to self-feed; Intermittent supervision/cueing  for swallowing strategies Swallowing strategies  : Slow rate; Small bites/sips; Multiple dry  swallows after each bite/sip Postural changes: Position pt fully upright for meals; Stay upright 30-60  min after meals Oral care recommendations: Oral care BID (2x/day); Staff/trained caregiver  to provide oral care  Treatment Plan Treatment Plan Treatment recommendations: Therapy as outlined in treatment plan below Follow-up recommendations: Follow physicians's recommendations for  discharge plan and follow up therapies Functional status assessment: Patient has had a recent decline in their  functional status and demonstrates the ability to make significant  improvements  in function in a reasonable and predictable amount of time. Treatment frequency: Min 2x/week Treatment duration: 1 week Interventions: Aspiration precaution training; Compensatory techniques;  Patient/family education; Diet toleration management by SLP  Recommendations Recommendations for follow up therapy are one component of a  multi-disciplinary discharge planning process, led by the attending  physician.  Recommendations may be updated  based on patient status,  additional functional criteria and insurance authorization.  Assessment: Orofacial Exam: Orofacial Exam Oral Cavity: Oral Hygiene: WFL Oral Cavity - Dentition: Missing dentition Orofacial Anatomy: WFL Oral Motor/Sensory Function: WFL  Anatomy:  Anatomy: WFL  Boluses Administered: Boluses Administered Boluses Administered: Thin liquids (Level 0); Mildly thick liquids (Level  2, nectar thick); Moderately thick liquids (Level 3, honey thick); Puree;  Solid     Oral Impairment Domain: Oral Impairment Domain Lip Closure: Escape progressing to mid-chin Tongue control during bolus hold: Escape to lateral buccal cavity/floor of  mouth Bolus preparation/mastication: Slow prolonged chewing/mashing with  complete recollection Bolus transport/lingual motion: Delayed initiation of tongue motion (oral  holding) Oral residue: Trace residue lining oral structures Location of oral residue : Tongue Initiation of pharyngeal swallow : Valleculae     Pharyngeal Impairment Domain: Pharyngeal Impairment Domain Soft palate elevation: Trace column of contrast or air between SP and PW Laryngeal elevation: Partial superior movement of thyroid   cartilage/partial approximation of arytenoids to epiglottic petiole Anterior hyoid excursion: Partial anterior movement Epiglottic movement: Partial inversion Laryngeal vestibule closure: Incomplete, narrow column air/contrast in  laryngeal vestibule Pharyngeal stripping wave : Present - complete Pharyngeal contraction (A/P view only): N/A Pharyngoesophageal segment opening: Complete distension and complete  duration, no obstruction of flow Tongue base retraction: Narrow column of contrast or air between tongue  base and PPW Pharyngeal residue: Collection of residue within or on pharyngeal  structures Location of pharyngeal residue: Valleculae; Pyriform sinuses; Tongue base     Esophageal Impairment Domain: Esophageal  Impairment Domain Esophageal clearance upright position: Esophageal retention with  retrograde flow below pharyngoesophageal segment (PES)  Pill: Pill Consistency administered: Puree Puree: Impaired (see clinical impressions)  Penetration/Aspiration Scale Score: Penetration/Aspiration Scale Score 1.  Material does not enter airway: Puree; Solid; Pill; Mildly thick  liquids (Level 2, nectar thick) 3.  Material enters airway, remains ABOVE vocal cords and not ejected out:  Thin liquids (Level 0) 6.  Material enters airway, passes BELOW cords then ejected out:  Moderately thick liquids (Level 3, honey thick) (see impression statement-  mixed with secretions/phlegm)  Compensatory Strategies: Compensatory Strategies Compensatory strategies: Yes Multiple swallows: Effective Effective Multiple Swallows: Thin liquid (Level 0); Mildly thick liquid  (Level 2, nectar thick); Moderately thick liquid (Level 3, honey thick);  Puree      General Information: Caregiver present: No   Diet Prior to this Study: Dysphagia 2 (finely chopped); Thin liquids  (Level 0)    Temperature : Normal    Respiratory Status: WFL    Supplemental O2: None (Room air)    History of Recent Intubation: No   Behavior/Cognition: Alert; Cooperative  Self-Feeding Abilities: Able to self-feed  Baseline vocal quality/speech: Normal  Volitional Cough: Able to elicit  Volitional Swallow: Able to elicit  Exam Limitations: No limitations (some limitations with shoulder)  Goal Planning: Prognosis for improved oropharyngeal function: Good  No data recorded No data recorded Patient/Family Stated Goal: return home healthy  Consulted and agree with results and recommendations: Patient  Pain: Pain Assessment Pain Assessment: No/denies pain  End of Session: Start Time:SLP Start Time (ACUTE ONLY): 1312  Stop Time: SLP Stop Time (ACUTE ONLY): 1342  Time Calculation:SLP Time Calculation (min) (ACUTE  ONLY): 30 min  Charges: SLP Evaluations $ SLP Speech Visit: 1 Visit  SLP Evaluations $BSS Swallow: 1 Procedure $MBS Swallow: 1 Procedure  SLP visit diagnosis: SLP Visit Diagnosis: Dysphagia, oropharyngeal phase  (R13.12)  Past Medical History:  Past Medical History:  Diagnosis Date   Bladder cancer (HCC)    Hypertension    Past Surgical History:  Past Surgical History:  Procedure Laterality Date   BILIARY DILATION  03/12/2022   Procedure: BILIARY DILATION;  Surgeon: Wilhelmenia, Aloha Raddle., MD;   Location: [facility];  Service: Gastroenterology;;   BILIARY STENT PLACEMENT N/A 02/06/2021   Procedure: BILIARY STENT PLACEMENT;  Surgeon: Golda Claudis PENNER, MD;   Location: [facility];  Service: Gastroenterology;  Laterality: N/A;   BILIARY STENT PLACEMENT N/A 03/26/2021   Procedure: BILIARY STENT PLACEMENT 10 FRENCH BY 9cm;  Surgeon: Golda Claudis PENNER, MD;  Location: [facility];  Service: Endoscopy;  Laterality: N/A;   BILIARY STENT PLACEMENT N/A 01/19/2022   Procedure: BILIARY STENT PLACEMENT;  Surgeon: Wilhelmenia Aloha Raddle.,  MD;  Location: [facility];  Service: Gastroenterology;  Laterality: N/A;    BIOPSY  01/19/2022   Procedure: BIOPSY;  Surgeon: Wilhelmenia Aloha Raddle., MD;  Location: [facility];  Service: Gastroenterology;;   CATARACT EXTRACTION Bilateral    CHOLECYSTECTOMY N/A 04/25/2021   Procedure: LAPAROSCOPIC CHOLECYSTECTOMY;  Surgeon: Mavis Anes, MD;   Location: [facility];  Service: General;  Laterality: N/A;   ENDOSCOPIC RETROGRADE CHOLANGIOPANCREATOGRAPHY (ERCP) WITH PROPOFOL  N/A  01/19/2022   Procedure: ENDOSCOPIC RETROGRADE CHOLANGIOPANCREATOGRAPHY (ERCP) WITH  PROPOFOL ;  Surgeon: Wilhelmenia Aloha Raddle., MD;  Location: [facility];   Service: Gastroenterology;  Laterality: N/A;   ENDOSCOPIC RETROGRADE CHOLANGIOPANCREATOGRAPHY (ERCP) WITH PROPOFOL  N/A  03/12/2022   Procedure: ENDOSCOPIC RETROGRADE CHOLANGIOPANCREATOGRAPHY (ERCP) WITH  PROPOFOL ;  Surgeon:  Wilhelmenia Aloha Raddle., MD;  Location: [facility];   Service: Gastroenterology;  Laterality: N/A;   ERCP N/A 02/06/2021   Procedure: ENDOSCOPIC RETROGRADE CHOLANGIOPANCREATOGRAPHY (ERCP);   Surgeon: Golda Claudis PENNER, MD;  Location: [facility];  Service:  Gastroenterology;  Laterality: N/A;   ERCP N/A 03/26/2021   Procedure: ENDOSCOPIC RETROGRADE CHOLANGIOPANCREATOGRAPHY (ERCP) with  EXTENDED SPHINCTEROTOMY;  Surgeon: Golda Claudis PENNER, MD;  Location: [facility];  Service: Endoscopy;  Laterality: N/A;   ERCP N/A 10/27/2021   Procedure: ENDOSCOPIC RETROGRADE CHOLANGIOPANCREATOGRAPHY (ERCP);   Surgeon: Golda Claudis PENNER, MD;  Location: [facility];  Service: Endoscopy;   Laterality: N/A;  no site    GASTROINTESTINAL STENT REMOVAL N/A 03/26/2021   Procedure: GASTROINTESTINAL STENT REMOVAL;  Surgeon: Golda Claudis PENNER,  MD;  Location: [facility];  Service: Endoscopy;  Laterality: N/A;   GASTROINTESTINAL STENT REMOVAL N/A 10/27/2021   Procedure: GASTROINTESTINAL STENT EXCHANGE;  Surgeon: Golda Claudis PENNER,  MD;  Location: [facility];  Service: Endoscopy;  Laterality: N/A;  no site    REMOVAL OF STONES N/A 03/26/2021   Procedure: REMOVAL OF STONES WITH EXTRACTION BASKET;  Surgeon: Golda Claudis PENNER, MD;  Location: [facility];  Service: Endoscopy;  Laterality: N/A;   REMOVAL OF STONES N/A 10/27/2021   Procedure: REMOVAL OF STONES;  Surgeon: Golda Claudis PENNER, MD;  [facility];  Service: Endoscopy;  Laterality: N/A;   REMOVAL OF STONES  01/19/2022   Procedure: REMOVAL OF STONES;  Surgeon: Wilhelmenia Aloha Raddle., MD;   Location: [facility];  Service: Gastroenterology;;   REMOVAL OF STONES  03/12/2022   Procedure: REMOVAL  OF STONES;  Surgeon: Mansouraty, Aloha Raddle., MD;   Location: [facility];  Service: Gastroenterology;;   ANNETT N/A 02/06/2021   Procedure: ANNETT;  Surgeon: Golda Claudis PENNER, MD;  [facility];  Service: Gastroenterology;  Laterality: N/A;   SPHINCTEROTOMY N/A  10/27/2021   Procedure: SPHINCTEROTOMY;  Surgeon: Golda Claudis PENNER, MD;  [facility];  Service: Endoscopy;  Laterality: N/A;   SPYGLASS CHOLANGIOSCOPY N/A 01/19/2022   Procedure: SPYGLASS CHOLANGIOSCOPY;  Surgeon: Wilhelmenia Aloha Raddle.,  MD;  Location: [facility];  Service: Gastroenterology;  Laterality: N/A;    SPYGLASS CHOLANGIOSCOPY N/A 03/12/2022   Procedure: DEBHOJDD CHOLANGIOSCOPY;  Surgeon: Wilhelmenia Aloha Raddle.,  MD;  Location: [facility];  Service: Gastroenterology;  Laterality: N/A;    SPYGLASS LITHOTRIPSY N/A 03/12/2022   Procedure: DEBHOJDD LITHOTRIPSY;  Surgeon: Wilhelmenia Aloha Raddle., MD;   Location: [facility];  Service: Gastroenterology;  Laterality: N/A;   STENT REMOVAL  01/19/2022   Procedure: STENT REMOVAL;  Surgeon: Wilhelmenia Aloha Raddle., MD;   Location: [facility];  Service: Gastroenterology;;   CLEDA REMOVAL  03/12/2022   Procedure: STENT REMOVAL;  Surgeon: Wilhelmenia Aloha Raddle., MD;   Location: [facility];  Service: Gastroenterology;;   STONE EXTRACTION WITH BASKET  01/19/2022   Procedure: STONE EXTRACTION WITH BASKET;  Surgeon: Wilhelmenia Aloha Raddle., MD;  Location: [facility];  Service: Gastroenterology;;   Thank you,  Lamar Candy, CCC-SLP [phone]  PORTER,DABNEY 06/05/2024, 2:46 PM DG CHEST PORT 1 VIEW CLINICAL DATA:  Aspiration pneumonia.  EXAM: PORTABLE CHEST 1 VIEW  COMPARISON:  06/02/2024  FINDINGS: Cardiopericardial silhouette is at upper limits of normal for size. Interstitial with diffuse patchy bilateral airspace disease is similar to prior. No substantial pleural effusion. Telemetry leads overlie the chest.  IMPRESSION: No substantial interval change. Diffuse patchy bilateral airspace disease compatible with asymmetric edema or diffuse infection.  Electronically Signed   By: Camellia Candle M.D.   On: 06/05/2024 05:47        ASSESSMENT: Mr. Traynham is an 83 year old male who is followed by  oncology for stage IV bladder cancer with mets to the bone.  PLAN:  Stage IV bladder cancer with mets to the bone. He is status post 2 cycles of EV plus pembrolizumab  started on 04/17/2024. Since then, he has been admitted twice for CVA and now most recently aspiration pneumonia. At this point, I would recommend holding additional treatment until he has significant improvement of his functional status. He met with hospice/palliative care and they have opted to go with hospice care moving forward. It sounds like he will be discharged either tomorrow or Friday and a hospital bed will be delivered to the patient's home tomorrow. At this time, we will cancel all additional appointments with us .   Thank you for involving us  in this patient's care.  Please to reach out with any questions or concerns.  Delon Hope, AGNP-C Department of Hematology/Oncology [facility] at [facility]  Phone: [phone]  06/07/2024 1:23 PM

## 2024-06-07 NOTE — TOC Progression Note (Addendum)
 Transition of Care [facility]) - Progression Note    Patient Details  Name: Dean Randolph MRN: [id number] Date of Birth: September 08, 1940  Transition of [facility]) CM/SW Contact  [name], RN Phone Number: 06/07/2024, 12:22 PM  Clinical Narrative:   Palliative consulted IPCM to refer to [facility] for a discharge plan to go home with Hospice. Patient will need DME. Patient is active with [facility] palliative, CM called [name], she will contact the daughter and update IPCM as soon as possible with a discharge plan and DME delivery. Team updated.  Addendum: Hospital bed will be delivered late this evening or morning. Family wants bed in the home before the patient arrives. Patient is on comfort care. [name] updated and will update IPCM team when bed has been delivered.   Expected Discharge Plan: Home w Hospice Care Barriers to Discharge: Other (must enter comment) (DME delivery)      Expected Discharge Plan and Services In-house Referral: Clinical Social Work Discharge Planning Services: NA Post Acute Care Choice: Home Health, Resumption of Svcs/PTA Provider Living arrangements for the past 2 months: Single Family Home                      Social Drivers of Health (SDOH) Interventions SDOH Screenings   Food Insecurity: No Food Insecurity (06/02/2024)  Housing: Low Risk  (06/02/2024)  Transportation Needs: No Transportation Needs (06/02/2024)  Utilities: Not At Risk (06/02/2024)  Depression (PHQ2-9): Low Risk  (05/15/2024)  Social Connections: Moderately Integrated (06/02/2024)  Tobacco Use: Low Risk  (06/02/2024)    Readmission Risk Interventions    06/03/2024    8:58 AM 05/28/2024   12:10 PM 05/02/2024    9:55 AM  Readmission Risk Prevention Plan  Transportation Screening Complete Complete Complete  PCP or Specialist Appt within 5-7 Days   Not Complete  Not Complete comments   PCP list added to AVS  PCP or Specialist Appt within 3-5 Days  Not Complete   Home Care Screening    Complete  Medication Review (RN CM)   Complete  HRI or Home Care Consult  Complete   Social Work Consult for Recovery Care Planning/Counseling  Complete   Palliative Care Screening  Not Complete   Medication Review [employer]) Complete Complete   PCP or Specialist appointment within 3-5 days of discharge Complete    HRI or Home Care Consult Complete    SW Recovery Care/Counseling Consult Complete    Palliative Care Screening Complete    Skilled Nursing Facility Not Applicable

## 2024-06-07 NOTE — Progress Notes (Signed)
 PROGRESS NOTE  Dean Randolph FMW:[id number] DOB: 1941-04-15 DOA: 06/02/2024 PCP: Davonna Siad, MD  Brief History:  83 y.o. 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  Daughter at bedside states that patient has had a mild cough prior to being discharged, this worsened (a day after being discharged) after eating a meal at home, cough was productive with yellowish-green sputum and it was associated with shortness of breath when he has a coughing fit.   ED course In the emergency department, patient was tachypneic, tachycardic, temperature was 97.5 F, O2 sat was 95 to 98% on 3 LPM of oxygen (patient does not use oxygen at baseline).  Workup in the ED showed normocytic anemia and leukocytosis.  BMP was normal except for blood glucose of 132 lactic acid 3.3 > 2.0.  Urinalysis was suspicious for UTI.  Urine culture was positive for Stenotrophomonas maltophilia.  Chest x-ray was suggestive of worsening edema or infection.  EKG personally reviewed which showed wide QRS tachycardia heart rate of 148 bpm with RBBB.  Patient was treated with IV vancomycin  and cefepime , IV hydration.  Unfortunately, the patient continued to have signs of aspiration.  He continued to have recurrent signs of fever and SIRS during hospitalization despite being on antibiotics.  His antibiotics were broadened to Zosyn .  He was continued on Bactrim  DS.  Palliative medicine was consulted.  The patient remained very deconditioned without significant improvement.  Goals of care were discussed with the patient and family.  Ultimately, patient's family wanted to transition him to comfort measures.  TOC assisted in transitioning patient home with hospice care.    Assessment/Plan: Aspiration pneumonia  -- continues to have fever and SIRS with signs of aspiration -- continue aspiration precautions -- SLP evaluation s/p MBS -- dysphagia diet as ordered -- adding chest PT and duonebs to help clear secretions if possible -- after MBS placed on dys 3 diet, thin liquids by SLP  -- he remains high risk for recurrent aspiration, multiple discussions with family -- after final discussion of the day today, they seem open to taking him home with hospice and accepting of necessary hospice equipment - zosyn  was started on 12/9, but now d/c with transition to comfort measures - 12/10 now will accept pt for comfort feeds as goals of care now focused on comfort   Lactic acidosis  -- resolved with treatments   Stenotrophomonas maltophilia UTI  -- started on Bactrim  DS   Essential hypertension  --- BPs stable, follow   Urothelial carcinoma -- metastasis to bone and lymph nodes -- he hasn't been able to get his palliative radiation due to being hospitalized -- he is on chemo treatments per oncology -- goal of care is to resume chemo and radiation -- anticipating he will go home with hospice -- family requested oncology consult and [facility] reached out to me for permission and I was happy to have them consult and weigh in to give family more information - appreciate med/onc consult    GERD  -- pantoprazole  for GI protection    Urinary retention  -- foley cath was changed on admission -- tamsulosin  daily ordered   Protein calorie malnutrition  -- agree with meal supplementation as ordered    Goal  of care -- long discussion with family on 12/7 about patient's precipitous decline in last months especially after recent CVA. --continued GOC with palliative medicine>>transition to comfort measures on 12/10        Family Communication:   daughter at bedside 12/10  Consultants:  palliative  Code Status:  FULL COMFORT  DVT Prophylaxis:   FULL COMFORT   Procedures: As Listed in Progress Note Above  Antibiotics: Zosyn  12/9>>12/10 Bactrim  DS 12/6>>12/10     Subjective: Patient is confused.  He is intermittently agitated.  Denies any chest pain.  He has some shortness of breath.  Denies any abdominal pain.  He continues to have choking episodes.  Objective: Vitals:   06/07/24 0610 06/07/24 0915 06/07/24 1226 06/07/24 1359  BP: 126/65  128/81   Pulse: (!) 104  (!) 109   Resp: 20     Temp: 98.1 F (36.7 C)  98.6 F (37 C)   TempSrc: Axillary  Oral   SpO2: 92% (!) 89% 91% 90%  Weight:      Height:        Intake/Output Summary (Last 24 hours) at 06/07/2024 1813 Last data filed at 06/07/2024 0600 Gross per 24 hour  Intake 50 ml  Output 400 ml  Net -350 ml   Weight change:  Exam:  General:  Pt is alert, follows commands appropriately, not in acute distress HEENT: No icterus, No thrush, No neck mass, [place]/AT Cardiovascular: RRR, S1/S2, no rubs, no gallops Respiratory: Bilateral rales. Abdomen: Soft/+BS, non tender, non distended, no guarding Extremities: No edema, No lymphangitis, No petechiae, No rashes, no synovitis   Data Reviewed: I have personally reviewed following labs and imaging studies Basic Metabolic Panel: Recent Labs  Lab 06/03/24 0406 06/04/24 0450 06/05/24 0443 06/06/24 0421 06/07/24 0555  NA 137 136 135 136 138  K 3.4* 3.4* 3.3* 3.9 3.4*  CL 103 103 103 102 103  CO2 25 24 19* 23 24  GLUCOSE 108* 97 95 100* 89  BUN 22 19 18 18 20   CREATININE 0.81 0.76 0.78 0.74 0.94  CALCIUM  8.4* 8.6* 8.5* 8.7* 8.8*  MG 2.0 1.9  --   --   --   PHOS 3.3  --   --   --   --    Liver Function Tests: Recent Labs  Lab 06/02/24 2022 06/03/24 0406  AST 45* 32  ALT 34 25  ALKPHOS 208* 158*  BILITOT 0.5 0.4  PROT 7.5 6.0*  ALBUMIN 3.3* 2.7*   No results for input(s): LIPASE, AMYLASE in the last 168 hours. No results for input(s): AMMONIA in the last 168 hours. Coagulation  Profile: Recent Labs  Lab 06/02/24 2022  INR 1.2   CBC: Recent Labs  Lab 06/02/24 2022 06/03/24 0406 06/04/24 0450 06/05/24 0443 06/06/24 0421 06/07/24 0555  WBC 14.1* 14.4* 11.8* 9.0 8.2 9.6  NEUTROABS 12.7*  --  10.0* 7.2 7.4 8.2*  HGB 10.4* 8.7* 8.6* 8.3* 9.2* 9.2*  HCT 33.3* 27.6* 28.0* 26.0* 29.1* 29.7*  MCV 97.1 95.5 94.3 94.9 92.4 94.0  PLT 394 301 267 256 248 231   Cardiac Enzymes: No results for input(s): CKTOTAL, CKMB, CKMBINDEX, TROPONINI in the last 168 hours. BNP: Invalid input(s): POCBNP CBG: No results for input(s): GLUCAP in the last 168 hours. HbA1C: No results for input(s): HGBA1C in the last 72 hours. Urine analysis:    Component Value Date/Time   COLORURINE YELLOW 06/02/2024 2022   APPEARANCEUR CLOUDY (A) 06/02/2024 2022   LABSPEC 1.016 06/02/2024 2022  PHURINE 5.0 06/02/2024 2022   GLUCOSEU NEGATIVE 06/02/2024 2022   HGBUR MODERATE (A) 06/02/2024 2022   BILIRUBINUR NEGATIVE 06/02/2024 2022   KETONESUR NEGATIVE 06/02/2024 2022   PROTEINUR 100 (A) 06/02/2024 2022   NITRITE NEGATIVE 06/02/2024 2022   LEUKOCYTESUR LARGE (A) 06/02/2024 2022   Sepsis Labs: @LABRCNTIP (procalcitonin:4,lacticidven:4) ) Recent Results (from the past 240 hours)  Culture, blood (Routine x 2)     Status: None   Collection Time: 06/02/24  8:22 PM   Specimen: Right Antecubital; Blood  Result Value Ref Range Status   Specimen Description RIGHT ANTECUBITAL  Final   Special Requests   Final    BOTTLES DRAWN AEROBIC AND ANAEROBIC Blood Culture adequate volume   Culture   Final    NO GROWTH 5 DAYS Performed at [facility], [address], [place], [place] [address]    Report Status 06/07/2024 FINAL  Final  Urine Culture     Status: Abnormal (Preliminary result)   Collection Time: 06/02/24  8:22 PM   Specimen: Urine, Random  Result Value Ref Range Status   Specimen Description   Final    URINE, RANDOM Performed at [facility], [address],  [place], [place] [address]    Special Requests   Final    NONE Reflexed from [id number] Performed at [facility], [address] [place], [place] [address]    Culture (A)  Final    >=100,000 COLONIES/mL STENOTROPHOMONAS MALTOPHILIA Sent to [facility] for further susceptibility testing. Performed at [facility], [address], [place], [place] [address]    Report Status PENDING  Incomplete  Susceptibility, Aer + Anaerob     Status: Abnormal   Collection Time: 06/02/24  8:22 PM  Result Value Ref Range Status   Suscept, Aer + Anaerob Preliminary report (A)  Final    Comment: (NOTE) Performed At: [facility] [address] [place], [place] [id number] Jennette Shorter MD Ey:[id number]    Source STENOTROPHOMONAS MALTOPHILIA/ URINE  Corrected    Comment: Performed at [facility], [address], [place], [place] [address] CORRECTED ON 12/08 AT 0844: PREVIOUSLY REPORTED AS URINE    Susceptibility Result     Status: Abnormal   Collection Time: 06/02/24  8:22 PM  Result Value Ref Range Status   Suscept Result 1 Comment (A)  Final    Comment: (NOTE) Stenotrophomonas maltophilia Identification performed by account, not confirmed by this laboratory. Performed At: [facility] [address] [place], [place] [id number] Jennette Shorter MD Ey:[id number]   Culture, blood (Routine x 2)     Status: None   Collection Time: 06/02/24  8:42 PM   Specimen: BLOOD RIGHT ARM  Result Value Ref Range Status   Specimen Description BLOOD RIGHT ARM  Final   Special Requests   Final    BOTTLES DRAWN AEROBIC AND ANAEROBIC Blood Culture adequate volume   Culture   Final    NO GROWTH 5 DAYS Performed at [facility], [address] [place], [place] [address]    Report Status 06/07/2024 FINAL  Final  Culture, blood (Routine X 2) w Reflex to ID Panel     Status: None (Preliminary result)   Collection Time: 06/06/24  5:10 AM   Specimen: BLOOD  Result Value Ref Range Status   Specimen  Description BLOOD RIGHT ANTECUBITAL  Final   Special Requests   Final    BOTTLES DRAWN AEROBIC AND ANAEROBIC Blood Culture adequate volume   Culture   Final  NO GROWTH 1 DAY Performed at [facility], [address], [place], [place] [address]    Report Status PENDING  Incomplete  Culture, blood (Routine X 2) w Reflex to ID Panel     Status: None (Preliminary result)   Collection Time: 06/06/24  5:12 AM   Specimen: BLOOD  Result Value Ref Range Status   Specimen Description BLOOD LEFT ANTECUBITAL  Final   Special Requests   Final    BOTTLES DRAWN AEROBIC AND ANAEROBIC Blood Culture adequate volume   Culture   Final    NO GROWTH 1 DAY Performed at [facility], [address] [place], [place] [address]    Report Status PENDING  Incomplete  MRSA Next Gen by PCR, Nasal     Status: None   Collection Time: 06/06/24  6:46 AM   Specimen: Nasal Mucosa; Nasal Swab  Result Value Ref Range Status   MRSA by PCR Next Gen NOT DETECTED NOT DETECTED Final    Comment: (NOTE) The GeneXpert MRSA Assay (FDA approved for NASAL specimens only), is one component of a comprehensive MRSA colonization surveillance program. It is not intended to diagnose MRSA infection nor to guide or monitor treatment for MRSA infections. Test performance is not FDA approved in patients less than 76 years old. Performed at [facility], [address], [place], [place] [address]      Scheduled Meds:  aspirin  EC  81 mg Oral Daily   Chlorhexidine  Gluconate Cloth  6 each Topical Q0600   clopidogrel   75 mg Oral Daily   dextromethorphan   30 mg Oral BID   famotidine   20 mg Oral QHS   feeding supplement  237 mL Oral BID BM   guaiFENesin   10 mL Oral TID   ipratropium-albuterol   3 mL Nebulization TID   mirtazapine   15 mg Oral QHS   pantoprazole   40 mg Oral Daily   tamsulosin   0.4 mg Oral Daily   Continuous Infusions:  Procedures/Studies: DG Swallowing Func-Speech Pathology Result Date: 06/05/2024 Table formatting  from the original result was not included. Modified Barium Swallow Study Patient Details Name: KYNDAL GLOSTER MRN: [id number] Date of Birth: 02/09/1941 Today's Date: 06/05/2024 HPI/PMH: HPI: 83 y.o. 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 He had recent admission 11/29-12/4 with findings of new stroke. Initial plan was to discharge to CIR, however, insurance declined to pay. He was discharged home with PT/OT and had quick return due to respiratory symptoms. BSE completed yesterday with recommendation for MBS. Clinical Impression: Clinical Impression: MBSS completed during acute stay and Pt was assessed in the lateral position with barium tinged thin, nectar, honey, puree, regular textures, and a barium tablet in puree. Pt presents with mi/mod oropharyngeal phase dysphagia with suspected esophageal component. Oral phase is marked by reduced labial closure and mild lingual weakness with residual post swallow; pharyngeal phase is marked by swallow trigger after filling the valleculae with thins, min reduced tonge base retraction and hyolaryngeal excursion, and incomplete epiglottic deflection resulting in residuals in the valleculae which then spill to the lateral channels and pyriforms, and trace penetration of thins after the swallow. Pt was noted to have aspiration of secretions mixed with barium after he consumes honey thick liquids followed by puree and regular textures. Pt with congested cough and phlegm tinged with barium was noted to fall beneath the vocal folds and then  expelled with a cough and swallowed. The barium tablet was presented in puree and briefly stopped in the valleculae before traversiing the esophagus and delayed in what appeared to be a hiatal hernia (no radiologist present to confirm). Min backflow of barium noted during esophageal  sweep. Recommend D3/mech soft and thin liquids via cup/straw sips when Pt is alert and upright and swallow 2-3x for each bite/sip, pills whole in puree, and remain upright for ~30 minutes post meals for reflux precautions. SLP will follow during acute stay to review recommendations with Pt and family. Factors that may increase risk of adverse event in presence of aspiration Noe & Lianne 2021): Factors that may increase risk of adverse event in presence of aspiration Noe & Lianne 2021): Frail or deconditioned Recommendations/Plan: Swallowing Evaluation Recommendations Swallowing Evaluation Recommendations Recommendations: PO diet PO Diet Recommendation: Dysphagia 3 (Mechanical soft); Thin liquids (Level 0) Liquid Administration via: Straw; Cup Medication Administration: Whole meds with puree Supervision: Patient able to self-feed; Intermittent supervision/cueing for swallowing strategies Swallowing strategies  : Slow rate; Small bites/sips; Multiple dry swallows after each bite/sip Postural changes: Position pt fully upright for meals; Stay upright 30-60 min after meals Oral care recommendations: Oral care BID (2x/day); Staff/trained caregiver to provide oral care Treatment Plan Treatment Plan Treatment recommendations: Therapy as outlined in treatment plan below Follow-up recommendations: Follow physicians's recommendations for discharge plan and follow up therapies Functional status assessment: Patient has had a recent decline in their functional status and demonstrates the ability to make significant improvements in function in a reasonable and predictable amount of time. Treatment frequency: Min 2x/week Treatment duration: 1 week Interventions: Aspiration precaution training; Compensatory techniques; Patient/family education; Diet toleration management by SLP Recommendations Recommendations for follow up therapy are one component of a multi-disciplinary discharge planning process, led by the attending  physician.  Recommendations may be updated based on patient status, additional functional criteria and insurance authorization. Assessment: Orofacial Exam: Orofacial Exam Oral Cavity: Oral Hygiene: WFL Oral Cavity - Dentition: Missing dentition Orofacial Anatomy: WFL Oral Motor/Sensory Function: WFL Anatomy: Anatomy: WFL Boluses Administered: Boluses Administered Boluses Administered: Thin liquids (Level 0); Mildly thick liquids (Level 2, nectar thick); Moderately thick liquids (Level 3, honey thick); Puree; Solid  Oral Impairment Domain: Oral Impairment Domain Lip Closure: Escape progressing to mid-chin Tongue control during bolus hold: Escape to lateral buccal cavity/floor of mouth Bolus preparation/mastication: Slow prolonged chewing/mashing with complete recollection Bolus transport/lingual motion: Delayed initiation of tongue motion (oral holding) Oral residue: Trace residue lining oral structures Location of oral residue : Tongue Initiation of pharyngeal swallow : Valleculae  Pharyngeal Impairment Domain: Pharyngeal Impairment Domain Soft palate elevation: Trace column of contrast or air between SP and PW Laryngeal elevation: Partial superior movement of thyroid  cartilage/partial approximation of arytenoids to epiglottic petiole Anterior hyoid excursion: Partial anterior movement Epiglottic movement: Partial inversion Laryngeal vestibule closure: Incomplete, narrow column air/contrast in laryngeal vestibule Pharyngeal stripping wave : Present - complete Pharyngeal contraction (A/P view only): N/A Pharyngoesophageal segment opening: Complete distension and complete duration, no obstruction of flow Tongue base retraction: Narrow column of contrast or air between tongue base and PPW Pharyngeal residue: Collection of residue within or on pharyngeal structures Location of pharyngeal residue: Valleculae; Pyriform sinuses; Tongue base  Esophageal Impairment Domain: Esophageal Impairment Domain Esophageal clearance  upright position: Esophageal retention with retrograde flow below pharyngoesophageal segment (PES) Pill: Pill Consistency administered: Puree Puree: Impaired (see clinical impressions) Penetration/Aspiration Scale Score: Penetration/Aspiration Scale Score 1.  Material does not enter airway: Puree; Solid; Pill;  Mildly thick liquids (Level 2, nectar thick) 3.  Material enters airway, remains ABOVE vocal cords and not ejected out: Thin liquids (Level 0) 6.  Material enters airway, passes BELOW cords then ejected out: Moderately thick liquids (Level 3, honey thick) (see impression statement- mixed with secretions/phlegm) Compensatory Strategies: Compensatory Strategies Compensatory strategies: Yes Multiple swallows: Effective Effective Multiple Swallows: Thin liquid (Level 0); Mildly thick liquid (Level 2, nectar thick); Moderately thick liquid (Level 3, honey thick); Puree   General Information: Caregiver present: No  Diet Prior to this Study: Dysphagia 2 (finely chopped); Thin liquids (Level 0)   Temperature : Normal   Respiratory Status: WFL   Supplemental O2: None (Room air)   History of Recent Intubation: No  Behavior/Cognition: Alert; Cooperative Self-Feeding Abilities: Able to self-feed Baseline vocal quality/speech: Normal Volitional Cough: Able to elicit Volitional Swallow: Able to elicit Exam Limitations: No limitations (some limitations with shoulder) Goal Planning: Prognosis for improved oropharyngeal function: Good No data recorded No data recorded Patient/Family Stated Goal: return home healthy Consulted and agree with results and recommendations: Patient Pain: Pain Assessment Pain Assessment: No/denies pain End of Session: Start Time:SLP Start Time (ACUTE ONLY): 1312 Stop Time: SLP Stop Time (ACUTE ONLY): 1342 Time Calculation:SLP Time Calculation (min) (ACUTE ONLY): 30 min Charges: SLP Evaluations $ SLP Speech Visit: 1 Visit SLP Evaluations $BSS Swallow: 1 Procedure $MBS Swallow: 1 Procedure SLP visit  diagnosis: SLP Visit Diagnosis: Dysphagia, oropharyngeal phase (R13.12) Past Medical History: Past Medical History: Diagnosis Date  Bladder cancer (HCC)   Hypertension  Past Surgical History: Past Surgical History: Procedure Laterality Date  BILIARY DILATION  03/12/2022  Procedure: BILIARY DILATION;  Surgeon: Mansouraty, Aloha Raddle., MD;  Location: [facility];  Service: Gastroenterology;;  BILIARY STENT PLACEMENT N/A 02/06/2021  Procedure: BILIARY STENT PLACEMENT;  Surgeon: Golda Claudis PENNER, MD;  Location: [facility];  Service: Gastroenterology;  Laterality: N/A;  BILIARY STENT PLACEMENT N/A 03/26/2021  Procedure: BILIARY STENT PLACEMENT 10 FRENCH BY 9cm;  Surgeon: Golda Claudis PENNER, MD;  Location: [facility];  Service: Endoscopy;  Laterality: N/A;  BILIARY STENT PLACEMENT N/A 01/19/2022  Procedure: BILIARY STENT PLACEMENT;  Surgeon: Wilhelmenia Aloha Raddle., MD;  Location: [facility];  Service: Gastroenterology;  Laterality: N/A;  BIOPSY  01/19/2022  Procedure: BIOPSY;  Surgeon: Wilhelmenia Aloha Raddle., MD;  Location: [facility];  Service: Gastroenterology;;  CATARACT EXTRACTION Bilateral   CHOLECYSTECTOMY N/A 04/25/2021  Procedure: LAPAROSCOPIC CHOLECYSTECTOMY;  Surgeon: Mavis Anes, MD;  Location: [facility];  Service: General;  Laterality: N/A;  ENDOSCOPIC RETROGRADE CHOLANGIOPANCREATOGRAPHY (ERCP) WITH PROPOFOL  N/A 01/19/2022  Procedure: ENDOSCOPIC RETROGRADE CHOLANGIOPANCREATOGRAPHY (ERCP) WITH PROPOFOL ;  Surgeon: Wilhelmenia Aloha Raddle., MD;  Location: [facility];  Service: Gastroenterology;  Laterality: N/A;  ENDOSCOPIC RETROGRADE CHOLANGIOPANCREATOGRAPHY (ERCP) WITH PROPOFOL  N/A 03/12/2022  Procedure: ENDOSCOPIC RETROGRADE CHOLANGIOPANCREATOGRAPHY (ERCP) WITH PROPOFOL ;  Surgeon: Wilhelmenia Aloha Raddle., MD;  Location: [facility];  Service: Gastroenterology;  Laterality: N/A;  ERCP N/A 02/06/2021  Procedure: ENDOSCOPIC RETROGRADE CHOLANGIOPANCREATOGRAPHY (ERCP);  Surgeon: Golda Claudis PENNER, MD;  [facility]: [facility];  Service: Gastroenterology;  Laterality: N/A;  ERCP N/A 03/26/2021  Procedure: ENDOSCOPIC RETROGRADE CHOLANGIOPANCREATOGRAPHY (ERCP) with EXTENDED SPHINCTEROTOMY;  Surgeon: Golda Claudis PENNER, MD;  Location: [facility];  Service: Endoscopy;  Laterality: N/A;  ERCP N/A 10/27/2021  Procedure: ENDOSCOPIC RETROGRADE CHOLANGIOPANCREATOGRAPHY (ERCP);  Surgeon: Golda Claudis PENNER, MD;  Location: [facility];  Service: Endoscopy;  Laterality: N/A;  no site   GASTROINTESTINAL STENT REMOVAL N/A 03/26/2021  Procedure: GASTROINTESTINAL STENT REMOVAL;  Surgeon: Golda Claudis PENNER, MD;  [facility]: [facility];  Service: Endoscopy;  Laterality: N/A;  GASTROINTESTINAL STENT REMOVAL N/A 10/27/2021  Procedure: GASTROINTESTINAL STENT EXCHANGE;  Surgeon: Golda Claudis PENNER, MD;  Location: [facility];  Service: Endoscopy;  Laterality: N/A;  no site   REMOVAL OF STONES N/A 03/26/2021  Procedure: REMOVAL OF STONES WITH EXTRACTION BASKET;  Surgeon: Golda Claudis PENNER, MD;  Location: [facility];  Service: Endoscopy;  Laterality: N/A;  REMOVAL OF STONES N/A 10/27/2021  Procedure: REMOVAL OF STONES;  Surgeon: Golda Claudis PENNER, MD;  Location: [facility];  Service: Endoscopy;  Laterality: N/A;  REMOVAL OF STONES  01/19/2022  Procedure: REMOVAL OF STONES;  Surgeon: Wilhelmenia Aloha Raddle., MD;  Location: [facility];  Service: Gastroenterology;;  REMOVAL OF STONES  03/12/2022  Procedure: REMOVAL OF STONES;  Surgeon: Wilhelmenia Aloha Raddle., MD;  Location: [facility];  Service: Gastroenterology;;  ANNETT N/A 02/06/2021  Procedure: ANNETT;  Surgeon: Golda Claudis PENNER, MD;  Location: [facility];  Service: Gastroenterology;  Laterality: N/A;  SPHINCTEROTOMY N/A 10/27/2021  Procedure: SPHINCTEROTOMY;  Surgeon: Golda Claudis PENNER, MD;  Location: [facility];  Service: Endoscopy;  Laterality: N/A;  SPYGLASS CHOLANGIOSCOPY N/A 01/19/2022  Procedure: SPYGLASS CHOLANGIOSCOPY;  Surgeon: Wilhelmenia Aloha Raddle., MD;  Location: [facility];  Service: Gastroenterology;  Laterality:  N/A;  SPYGLASS CHOLANGIOSCOPY N/A 03/12/2022  Procedure: DEBHOJDD CHOLANGIOSCOPY;  Surgeon: Wilhelmenia Aloha Raddle., MD;  Location: [facility];  Service: Gastroenterology;  Laterality: N/A;  SPYGLASS LITHOTRIPSY N/A 03/12/2022  Procedure: DEBHOJDD LITHOTRIPSY;  Surgeon: Wilhelmenia Aloha Raddle., MD;  Location: [facility];  Service: Gastroenterology;  Laterality: N/A;  STENT REMOVAL  01/19/2022  Procedure: STENT REMOVAL;  Surgeon: Wilhelmenia Aloha Raddle., MD;  Location: [facility];  Service: Gastroenterology;;  CLEDA REMOVAL  03/12/2022  Procedure: STENT REMOVAL;  Surgeon: Wilhelmenia Aloha Raddle., MD;  Location: [facility];  Service: Gastroenterology;;  STONE EXTRACTION WITH BASKET  01/19/2022  Procedure: STONE EXTRACTION WITH BASKET;  Surgeon: Wilhelmenia Aloha Raddle., MD;  Location: [facility];  Service: Gastroenterology;; Thank you, Lamar Candy, CCC-SLP [phone] PORTER,DABNEY 06/05/2024, 2:46 PM  DG CHEST PORT 1 VIEW Result Date: 06/05/2024 CLINICAL DATA:  Aspiration pneumonia. EXAM: PORTABLE CHEST 1 VIEW COMPARISON:  06/02/2024 FINDINGS: Cardiopericardial silhouette is at upper limits of normal for size. Interstitial with diffuse patchy bilateral airspace disease is similar to prior. No substantial pleural effusion. Telemetry leads overlie the chest. IMPRESSION: No substantial interval change. Diffuse patchy bilateral airspace disease compatible with asymmetric edema or diffuse infection. Electronically Signed   By: Camellia Candle M.D.   On: 06/05/2024 05:47   DG Chest Port 1 View if patient is in a treatment room. Result Date: 06/02/2024 CLINICAL DATA:  Cough, sepsis, short of breath EXAM: PORTABLE CHEST 1 VIEW COMPARISON:  05/21/2024 FINDINGS: Single frontal view of the chest demonstrates a stable cardiac silhouette. Lung volumes are diminished, with continued pulmonary vascular congestion. Interval development of patchy bilateral airspace disease greatest at the left lung base, which may  reflect edema or infection. No effusion or pneumothorax. No acute bony abnormalities. IMPRESSION: 1. Low lung volumes, with vascular congestion and patchy bilateral airspace disease left greater than right. Findings may reflect worsening edema or infection. Electronically Signed   By: Ozell Daring M.D.   On: 06/02/2024 20:31   ECHOCARDIOGRAM COMPLETE Result Date: 05/28/2024    ECHOCARDIOGRAM REPORT   Patient Name:   Dean Randolph Date of Exam: 05/28/2024 Medical Rec #:  [id number]       Height:       69.0 in Accession #:    [id number]  Weight:       144.0 lb Date of Birth:  1941-05-10      BSA:          1.797 m Patient Age:    83 years        BP:           142/75 mmHg Patient Gender: M               HR:           100 bpm. Exam Location:  [name] Procedure: 2D Echo, Cardiac Doppler and Color Doppler (Both Spectral and Color            Flow Doppler were utilized during procedure). Indications:    Stroke I63.9  History:        Patient has no prior history of Echocardiogram examinations.                 Stroke and cancer, Signs/Symptoms:Altered Mental Status; Risk                 Factors:Hypertension and Dyslipidemia.  Sonographer:    Koleen Popper RDCS Referring Phys: [phone] JACOB J STINSON IMPRESSIONS  1. Left ventricular ejection fraction, by estimation, is 55 to 60%. The left ventricle has normal function. The left ventricle has no regional wall motion abnormalities. Left ventricular diastolic parameters were normal.  2. Right ventricular systolic function is normal. The right ventricular size is normal.  3. The mitral valve is normal in structure. No evidence of mitral valve regurgitation. No evidence of mitral stenosis.  4. The aortic valve is tricuspid. There is mild calcification of the aortic valve. There is mild thickening of the aortic valve. Aortic valve regurgitation is not visualized. Aortic valve sclerosis/calcification is present, without any evidence of aortic stenosis. FINDINGS  Left  Ventricle: Left ventricular ejection fraction, by estimation, is 55 to 60%. The left ventricle has normal function. The left ventricle has no regional wall motion abnormalities. The left ventricular internal cavity size was normal in size. There is  no left ventricular hypertrophy. Left ventricular diastolic parameters were normal. Right Ventricle: The right ventricular size is normal. No increase in right ventricular wall thickness. Right ventricular systolic function is normal. Left Atrium: Left atrial size was normal in size. Right Atrium: Right atrial size was normal in size. Pericardium: There is no evidence of pericardial effusion. Mitral Valve: The mitral valve is normal in structure. Mild mitral annular calcification. No evidence of mitral valve regurgitation. No evidence of mitral valve stenosis. Tricuspid Valve: The tricuspid valve is normal in structure. Tricuspid valve regurgitation is not demonstrated. Aortic Valve: The aortic valve is tricuspid. There is mild calcification of the aortic valve. There is mild thickening of the aortic valve. Aortic valve regurgitation is not visualized. Aortic valve sclerosis/calcification is present, without any evidence of aortic stenosis. Pulmonic Valve: The pulmonic valve was grossly normal. Pulmonic valve regurgitation is not visualized. No evidence of pulmonic stenosis. Aorta: The aortic root and ascending aorta are structurally normal, with no evidence of dilitation. IAS/Shunts: No atrial level shunt detected by color flow Doppler.  LEFT VENTRICLE PLAX 2D LVIDd:         3.80 cm   Diastology LVIDs:         2.70 cm   LV e' medial:    5.22 cm/s LV PW:         1.00 cm   LV E/e' medial:  11.3 LV IVS:  1.10 cm   LV e' lateral:   8.49 cm/s LVOT diam:     1.80 cm   LV E/e' lateral: 7.0 LV SV:         43 LV SV Index:   24 LVOT Area:     2.54 cm  RIGHT VENTRICLE RV S prime:     20.10 cm/s TAPSE (M-mode): 2.0 cm LEFT ATRIUM           Index LA diam:      3.20 cm 1.78  cm/m LA Vol (A2C): 27.4 ml 15.25 ml/m  AORTIC VALVE LVOT Vmax:   106.00 cm/s LVOT Vmean:  65.200 cm/s LVOT VTI:    0.169 m  AORTA Ao Root diam: 3.10 cm Ao Asc diam:  3.20 cm MITRAL VALVE MV Area (PHT): 4.49 cm     SHUNTS MV Decel Time: 169 msec     Systemic VTI:  0.17 m MV E velocity: 59.10 cm/s   Systemic Diam: 1.80 cm MV A velocity: 109.00 cm/s MV E/A ratio:  0.54 Mihai Croitoru MD Electronically signed by Jerel Balding MD Signature Date/Time: 05/28/2024/2:10:34 PM    Final    CT ANGIO HEAD NECK W WO CM Result Date: 05/28/2024 EXAM: CTA HEAD AND NECK WITHOUT AND WITH 05/28/2024 09:09:58 AM TECHNIQUE: CTA of the head and neck was performed without and with the administration of 75 mL of intravenous iohexol  (OMNIPAQUE ) 350 MG/ML injection. Multiplanar 2D and/or 3D reformatted images are provided for review. Automated exposure control, iterative reconstruction, and/or weight based adjustment of the mA/kV was utilized to reduce the radiation dose to as low as reasonably achievable. Stenosis of the internal carotid arteries measured using NASCET criteria. COMPARISON: CT and MRI of the head dated 05/27/2024. Recent CT angiogram of the chest performed 05/21/2024. CLINICAL HISTORY: Neuro deficit, acute, stroke suspected. FINDINGS: CTA NECK: AORTIC ARCH AND ARCH VESSELS: There is mild calcific plaque within the aortic arch. No dissection or arterial injury. No significant stenosis of the brachiocephalic or subclavian arteries. CERVICAL CAROTID ARTERIES: There is calcific and noncalcific plaque present within the origin of the right internal carotid artery, particularly along the posterior wall, with approximately 40% luminal stenosis. The cervical segment of the right internal carotid artery is otherwise normal in caliber. There is calcific and noncalcific plaque within the posterolateral origin of the left internal carotid artery, with less than 10% luminal stenosis. The remainder of the cervical segment is normal  in caliber. No dissection or arterial injury. CERVICAL VERTEBRAL ARTERIES: The left vertebral artery is dominant. Both vertebral arteries are normal in caliber throughout their respective courses. No dissection, arterial injury, or significant stenosis. LUNGS AND MEDIASTINUM: Mediastinal lymphadenopathy, better demonstrated on recent CT angiogram of the chest performed 05/21/2024. SOFT TISSUES: No cervical lymphadenopathy. The patient is status post bilateral lens replacement. No acute abnormality. BONES: No acute abnormality. CTA HEAD: ANTERIOR CIRCULATION: There is mild calcific plaque within the carotid siphons and communicating segments of the internal carotid arteries, with mild stenosis less than 30%. No significant stenosis of the anterior cerebral arteries. No significant stenosis of the middle cerebral arteries. No aneurysm. POSTERIOR CIRCULATION: There is moderate luminal irregularity of the P1 segment of the left posterior cerebral artery with moderate stenosis. There is mild-to-moderate stenosis of the P2 segment of the right posterior cerebral artery. No significant stenosis of the basilar artery. No significant stenosis of the vertebral arteries. No aneurysm. OTHER: Age-related atrophy and mild-to-moderate periventricular white matter disease. No dural venous sinus thrombosis on this non-dedicated  study. IMPRESSION: 1. Moderate luminal irregularity of the P1 segment of the left posterior cerebral artery with moderate stenosis. 2. Mild-to-moderate stenosis of the P2 segment of the right posterior cerebral artery. 3. Calcific and noncalcific plaque within the origin of the right internal carotid artery, particularly along the posterior wall, with approximately 40% luminal stenosis. The cervical segment is otherwise normal in caliber. 4. Calcific and noncalcific plaque within the posterolateral origin of the left internal carotid artery, with less than 10% luminal stenosis. The remainder of the cervical  segment is normal in caliber. 5. Mild calcific plaque within the carotid siphons and communicating segments of the internal carotid arteries, with mild stenosis less than 30%. Electronically signed by: Evalene Coho MD 05/28/2024 09:58 AM EST RP Workstation: [id number]   MR Brain W and Wo Contrast Result Date: 05/27/2024 EXAM: MRI BRAIN WITH AND WITHOUT CONTRAST 05/27/2024 03:01:24 PM TECHNIQUE: Multiplanar multisequence MRI of the head/brain was performed with and without the administration of intravenous contrast. 6 mL (gadobutrol  (GADAVIST ) 1 MMOL/ML injection 6 mL GADOBUTROL  1 MMOL/ML IV SOLN). COMPARISON: Head CT 05/27/2024. CLINICAL HISTORY: Headache, history of cancer. FINDINGS: The examination is intermittently mildly to moderately motion degraded. BRAIN AND VENTRICLES: A 7 mm linear focus of mildly restricted diffusion involving posterior left occipital cortex and a 3 mm focus of mildly restricted diffusion involving right occipital subcortical white matter are consistent with acute to early subacute infarcts. Chronic microhemorrhages are noted in the left thalamus. T2 hyperintensities in the cerebral white matter bilaterally are nonspecific but compatible with mild chronic small vessel ischemic disease. There are chronic lacunar infarcts in the basal ganglia bilaterally. No mass, midline shift, hydrocephalus, extra axial fluid collection, or abnormal intracranial enhancement is identified. There is moderate cerebral atrophy. Major intracranial vascular flow voids are preserved. ORBITS: Bilateral cataract extraction. SINUSES: No acute abnormality. BONES AND SOFT TISSUES: Normal bone marrow signal and enhancement. No acute soft tissue abnormality. IMPRESSION: 1. Small acute to early subacute bilateral occipital infarcts. 2. Mild chronic small vessel ischemic disease and moderate cerebral atrophy. 3. No evidence of intracranial metastatic disease. Electronically signed by: Dasie Hamburg MD 05/27/2024  03:46 PM EST RP Workstation: [id number]   CT Head Wo Contrast Result Date: 05/27/2024 EXAM: CT HEAD WITHOUT CONTRAST 05/27/2024 12:58:28 PM TECHNIQUE: CT of the head was performed without the administration of intravenous contrast. Automated exposure control, iterative reconstruction, and/or weight based adjustment of the mA/kV was utilized to reduce the radiation dose to as low as reasonably achievable. COMPARISON: None available. CLINICAL HISTORY: Mental status change, unknown cause. FINDINGS: BRAIN AND VENTRICLES: No acute hemorrhage. No evidence of acute infarct. No hydrocephalus. No extra-axial collection. No mass effect or midline shift. There is age-related atrophy and mild-to-moderate periventricular white matter disease. There is moderate calcific atheromatous disease within the carotid siphons. ORBITS: No acute abnormality. Patient is status post bilateral lens replacement. SINUSES: No acute abnormality. SOFT TISSUES AND SKULL: No acute soft tissue abnormality. No skull fracture. IMPRESSION: 1. No acute intracranial abnormality. 2. Age-related atrophy and mild-to-moderate periventricular white matter disease. 3. Moderate calcific atheromatous disease within the carotid siphons. Electronically signed by: Evalene Coho MD 05/27/2024 01:02 PM EST RP Workstation: [id number]   CT ABDOMEN PELVIS W CONTRAST Result Date: 05/22/2024 EXAM: CT ABDOMEN AND PELVIS WITH CONTRAST 05/21/2024 11:55:59 PM TECHNIQUE: CT of the abdomen and pelvis was performed with the administration of 75 mL of iohexol  (OMNIPAQUE ) 350 MG/ML injection. Multiplanar reformatted images are provided for review. Automated exposure control, iterative reconstruction, and/or weight-based adjustment  of the mA/kV was utilized to reduce the radiation dose to as low as reasonably achievable. COMPARISON: 03/19/2024 CLINICAL HISTORY: workup for pyelo on the right side. Flank pain. Known metastatic disease. FINDINGS: LOWER CHEST: See chest CT  report today. LIVER: Right hepatic cyst is unchanged. No suspicious focal hepatic abnormality. GALLBLADDER AND BILE DUCTS: Prior cholecystectomy. Pneumobilia is present. No biliary ductal dilatation. SPLEEN: No acute abnormality. PANCREAS: No acute abnormality. ADRENAL GLANDS: No acute abnormality. KIDNEYS, URETERS AND BLADDER: Left renal mass again noted measuring 3.9 x 3.2 cm compared to 4.1 x 3.4 cm previously. Enlarging left upper pole renal mass now measuring 3.1 x 2.6 cm compared to 1.8 x 1.3 cm previously. Perinephric metastatic nodules noted along the anterior perirenal fascia are again noted, unchanged. No stones in the kidneys or ureters. Small benign-appearing cysts in the right kidney are unchanged. No evidence of pyelonephritis. No hydronephrosis. No perinephric or periureteral stranding. Foley catheter within the bladder, which is decompressed. GI AND BOWEL: Stomach demonstrates no acute abnormality. There is no bowel obstruction. PERITONEUM AND RETROPERITONEUM: Extensive retroperitoneal adenopathy is unchanged. Retrocrural adenopathy is stable. Peritoneal metastases inferior to the left kidney measures 4.4 x 3.6 cm compared to 5.2 x 4.9 cm previously. No ascites. No free air. VASCULATURE: Aorta is normal in caliber. Aortic atherosclerosis. LYMPH NODES: Extensive retroperitoneal adenopathy is unchanged. Retrocrural adenopathy is stable. REPRODUCTIVE ORGANS: Mildly enlarged prostate. BONES AND SOFT TISSUES: Lytic lesions noted at T11, L1, and L2. Pelvic metastases seen on prior PET CT not as well visualized on today's CT. Right ischial tuberosity lytic lesion is unchanged. No focal soft tissue abnormality. IMPRESSION: 1. Enhancing left mid-pole renal mass is again noted, not significantly changed. Enlarging enhancing mass in the upper pole of the left kidney compatible with second primary or metastatic focus. 2. Peritoneal nodules along the anterior pararenal fascia and inferior to the left kidney  stable or slightly smaller since prior study. 3. Retrocrural and retroperitoneal adenopathy not significantly changed. 4. No acute abnormality within the right kidney. 5. Worsening osseous metastatic disease in the lower thoracic and upper lumbar spine. Electronically signed by: Franky Crease MD 05/22/2024 12:20 AM EST RP Workstation: [id number]   CT Angio Chest PE W and/or Wo Contrast Result Date: 05/22/2024 EXAM: CTA CHEST AORTA 05/21/2024 11:55:59 PM TECHNIQUE: CTA of the chest was performed after the administration of 75 mL of iohexol  (OMNIPAQUE ) 350 MG/ML injection. Multiplanar reformatted images are provided for review. MIP images are provided for review. Automated exposure control, iterative reconstruction, and/or weight based adjustment of the mA/kV was utilized to reduce the radiation dose to as low as reasonably achievable. COMPARISON: 03/19/2024, PET CT 03/30/2024. CLINICAL HISTORY: rule out PE/infiltrate. FINDINGS: AORTA: No thoracic aortic dissection. No aneurysm. MEDIASTINUM: Mediastinal adenopathy again noted. Subcarinal adenopathy is enlarged with a short axis diameter of 2.2 cm compared to 1.7 cm previously. Large right paratracheal mass has a short axis diameter of 3.5 cm compared to 3.4 cm, not significantly changed. Prevascular lymph node has a short axis diameter of 1.8 cm compared to 1.6 cm previously. Bilateral hilar adenopathy stable. The heart and pericardium demonstrate no acute abnormality. LYMPH NODES: No axillary lymphadenopathy. Mediastinal and hilar adenopathy as described above. LUNGS AND PLEURA: Numerous bilateral pulmonary metastases again noted, the largest in the right lower lobe measuring 12 x 11 mm compared to 13 x 9 mm previously. Overall pulmonary metastatic disease similar to prior study. Trace bilateral pleural effusions with bibasilar atelectasis. No pneumothorax. UPPER ABDOMEN: Stable moderate-sized hiatal hernia. SOFT  TISSUES AND BONES: Right lateral 5th and 6th rib  fractures noted, new since prior study. No acute soft tissue abnormality. IMPRESSION: 1. No evidence of pulmonary embolism. 2. Mediastinal and bilateral hilar adenopathy , mostly unchanged. However, subcarinal lymph nodes have enlarged since the prior study. 3. Numerous bilateral pulmonary metastases, overall similar to prior study. 4. Right lateral 5th and 6th rib fractures, new since prior study. Electronically signed by: Franky Crease MD 05/22/2024 12:08 AM EST RP Workstation: [id number]   DG Chest Portable 1 View Result Date: 05/21/2024 CLINICAL DATA:  Right-sided chest pain EXAM: PORTABLE CHEST 1 VIEW COMPARISON:  05/01/2024 FINDINGS: Single frontal view of the chest demonstrates an enlarged cardiac silhouette. Lung volumes are diminished, with increased central pulmonary vascular prominence. Increased density at the left lung base consistent with consolidation and/or effusion. No pneumothorax. No acute bony abnormalities. IMPRESSION: 1. Increased density at the left lung base consistent with consolidation and/or effusion. 2. Low lung volumes, with increased central pulmonary vascular congestion. 3. Enlarged cardiac silhouette. Electronically Signed   By: Ozell Daring M.D.   On: 05/21/2024 22:26    Alm Schneider, DO  [facility]  If 7PM-7AM, please contact night-coverage [email or web address] Password [email or web address] 06/07/2024, 6:13 PM   LOS: 5 days

## 2024-06-07 NOTE — Progress Notes (Signed)
 SLP Cancellation Note  Patient Details Name: Dean Randolph MRN: [id number] DOB: 1940/07/04   Cancelled treatment:       Reason Eval/Treat Not Completed: Patient declined, no reason specified (family transitioning to Hospice per report d/t pt regurgitating food; only taking small sips of liquids now; ST will s/o at this time; please re-consult prn)   Pat Geni Skorupski,M.S.,CCC-SLP 06/07/2024, 1:50 PM

## 2024-06-07 NOTE — Progress Notes (Addendum)
 Patient continued to complain of pain of 8/10 on p.o. morphine  regimen.  This was transitioned to IV morphine  IV at 1 milligram per hour.    Patient has been requiring more morphine  to control pain overnight.  This was titrated to 2 mg/hr which appeared to control the pain for some time.  Patient appears to be in pain even despite the 2 mg/h of morphine , this was now titrated to 3 mg/h.  We shall continue to monitor patient.

## 2024-06-07 NOTE — Progress Notes (Addendum)
 Daily Progress Note   Patient Name: Dean Randolph       Date: 06/07/2024 DOB: 03-Jul-1940  Age: 83 y.o. MRN#: [id number] Attending Physician: Evonnie Lenis, MD Primary Care Physician: Davonna Siad, MD Admit Date: 06/02/2024  Reason for Consultation/Follow-up: Establishing goals of care  Patient Profile/HPI:  83 y.o. 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 He had recent admission 11/29-12/4 with findings of new stroke. Initial plan was to discharge to CIR, however, insurance declined to pay. He was discharged home with PT/OT and had quick return due to respiratory symptoms. Palliative medicine consulted for goals of care.    Subjective: Chart reviewed including labs, progress notes, imaging from this and previous encounters. CBC and CMET reviewed.  On evaluation patient is having noticeable shortness of breath.  His daughter and spouse are at bedside and report he had another aspiration event this morning.  We discussed goals of care and determined that goal is for patient to return home. I shared my concern that there is likely a small window for patient to be stable enough to transfer home, and that hospice would best support him through what is likely end of life process.  Emotional support provided to patient's family.  Spouse shared her history with patient and her previous experiences with family deaths.  Daughter and spouse stated they would do their best to care for patient at home with help from hospice.   Review of Systems  Constitutional:  Positive for malaise/fatigue.  Respiratory:  Positive for cough.       Physical Exam Vitals and nursing note reviewed.  Constitutional:      Appearance: He is ill-appearing.     Comments: frail  Cardiovascular:     Rate and Rhythm: Tachycardia present.  Pulmonary:     Effort: Pulmonary effort is normal.     Comments: Wet cough Skin:    Coloration: Skin is pale.  Neurological:     Mental Status: He is alert and oriented to person, place, and time.             Vital Signs: BP 126/65 (BP Location: Left Arm)   Pulse (!) 104   Temp 98.1 F (  36.7 C) (Axillary)   Resp 20   Ht 5' 7 (1.702 m)   Wt 63.5 kg   SpO2 (!) 89%   BMI 21.93 kg/m  SpO2: SpO2: (!) 89 % O2 Device: O2 Device: Room Air O2 Flow Rate: O2 Flow Rate (L/min): 1 L/min  Intake/output summary:  Intake/Output Summary (Last 24 hours) at 06/07/2024 1209 Last data filed at 06/07/2024 0600 Gross per 24 hour  Intake 149.12 ml  Output 400 ml  Net -250.88 ml   LBM: Last BM Date : 06/06/24 Baseline Weight: Weight: 63.5 kg Most recent weight: Weight: 63.5 kg       Palliative Assessment/Data: PPS: 30%      Patient Active Problem List   Diagnosis Date Noted   Aspiration pneumonia (HCC) 06/03/2024   Failure to thrive in adult 06/03/2024   HCAP (healthcare-associated pneumonia) 06/02/2024   Encephalopathy acute 05/27/2024   Stroke (HCC) 05/27/2024   Protein-calorie malnutrition, severe 05/03/2024   Sepsis due to pneumonia (HCC) 05/01/2024   Urothelial carcinoma (HCC) 04/04/2024   Renal mass, left 03/23/2024   Back pain 03/23/2024   Elevated alkaline phosphatase level 04/23/2022   History of ERCP 04/23/2022   Colon cancer screening 04/23/2022   Calculus of gallbladder without cholecystitis without obstruction    Acute cholangitis (HCC) 02/09/2021   Pancreatitis due to biliary obstruction/S/p ERCP with stent Placement on 02/06/21 02/09/2021   Essential hypertension 02/07/2021   Cholangitis (HCC)    Choledocholithiasis/Cholelithiasis--S/p ERCP with Plastic Stent Placement  on 02/06/21 02/04/2021   Hyperglycemia 02/04/2021   Intermittent abdominal pain 02/04/2021   Early satiety 02/04/2021   Abnormal weight loss 02/04/2021   Hyperbilirubinemia 02/04/2021   Elevated liver enzymes 02/04/2021   Hematemesis 02/04/2021   Iron deficiency anemia    Transaminitis     Palliative Care Assessment & Plan    Assessment/Recommendations/Plan  Pneumonia- likely aspiration, in setting of history of stroke, and stage IV urothelia cancer- appears to be worsening, family considering option for comfort/hospice Decision made to transition to comfort measures and hospice On discharge, would recommend scripts for: - Morphine  Concentrate 10mg /0.61ml: 5mg  (0.59ml) sublingual every 1 hour as needed for pain or shortness of breath: Disp 30ml - Lorazepam  2mg /ml concentrated solution: 1mg  (0.62ml) sublingual every 4 hours as needed for anxiety: Disp 30ml - Haldol  2mg /ml solution: 0.5mg  (0.9ml) sublingual every 4 hours as needed for agitation or nausea: Disp 30ml  TOC order placed for hospice- appreciate their assistance   Code Status:   Code Status: Do not attempt resuscitation (DNR) - Comfort care   Prognosis:  < 2 weeks  Discharge Planning: Home with Hospice  Care plan was discussed with patient, family, and care team.   Thank you for allowing the Palliative Medicine Team to assist in the care of this patient.  Total time:  Prolonged billing:  Time includes:   Preparing to see the patient (e.g., review of tests) Obtaining and/or reviewing separately obtained history Performing a medically necessary appropriate examination and/or evaluation Counseling and educating the patient/family/caregiver Ordering medications, tests, or procedures Referring and communicating with other health care professionals (when not reported separately) Documenting clinical information in the electronic or other health record Independently interpreting results (not reported separately) and  communicating results to the patient/family/caregiver Care coordination (not reported separately) Clinical documentation  Cassondra Stain, AGNP-C Palliative Medicine   Please contact Palliative Medicine Team phone at [phone] for questions and concerns.

## 2024-06-08 DIAGNOSIS — J189 Pneumonia, unspecified organism: Secondary | ICD-10-CM | POA: Diagnosis not present

## 2024-06-08 DIAGNOSIS — R627 Adult failure to thrive: Secondary | ICD-10-CM | POA: Diagnosis not present

## 2024-06-08 DIAGNOSIS — Z515 Encounter for palliative care: Secondary | ICD-10-CM | POA: Diagnosis not present

## 2024-06-08 DIAGNOSIS — J690 Pneumonitis due to inhalation of food and vomit: Secondary | ICD-10-CM | POA: Diagnosis not present

## 2024-06-08 LAB — SUSCEPTIBILITY RESULT

## 2024-06-08 LAB — SUSCEPTIBILITY, AER + ANAEROB

## 2024-06-08 MED ORDER — MORPHINE BOLUS VIA INFUSION: 2.0000 mg | INTRAVENOUS | Status: DC | PRN

## 2024-06-08 MED ORDER — LORAZEPAM 1 MG PO TABS: 1.0000 mg | ORAL_TABLET | ORAL | Status: DC | PRN

## 2024-06-08 MED ORDER — LORAZEPAM 2 MG/ML IJ SOLN
2.0000 mg | INTRAMUSCULAR | Status: DC | PRN
  Administered 2024-06-08 (×2): 2 mg via INTRAVENOUS
  Filled 2024-06-08 (×2): qty 1

## 2024-06-08 MED ORDER — LORAZEPAM 2 MG/ML PO CONC: 1.0000 mg | ORAL | Status: DC | PRN

## 2024-06-08 MED ADMIN — Morphine Sulfate IV Soln PF 2 MG/ML: 5 mg | INTRAVENOUS | NDC 70092138036

## 2024-06-08 NOTE — Progress Notes (Signed)
 Morning rounds-- Family at bedside--daughter, wife, grand daughter, son in law Events of last night reviewed--agitation and pain Morphine  drip started  Subjective: Pt somnolent.  No distress  Vitals:   06/07/24 0610 06/07/24 0915 06/07/24 1226 06/07/24 1359  BP: 126/65  128/81   Pulse: (!) 104  (!) 109   Resp: 20     Temp: 98.1 F (36.7 C)  98.6 F (37 C)   TempSrc: Axillary  Oral   SpO2: 92% (!) 89% 91% 90%  Weight:      Height:       CV--RRR Lung--bilateral rales Abd--soft+BS   Assessment/Plan: Aspiration pneumonia/UTI -focusing on comfort measures  GOC -confirmed with family--focus on comfort measures -given events of last night and current situation, pt is not stable for transfer -discussed continuing comfort measures in hospital to allow for natural death -family agrees VS--HR [id number]--87% on 2L     Alm Schneider, DO [facility]

## 2024-06-08 NOTE — Progress Notes (Signed)
 Daily Progress Note   Patient Name: Dean Randolph       Date: June 11, 2024 DOB: August 12, 1940  Age: 83 y.o. MRN#: [id number] Attending Physician: Evonnie Lenis, MD Primary Care Physician: Davonna Siad, MD Admit Date: 06/02/2024  Reason for Consultation/Follow-up: Establishing goals of care  Patient Profile/HPI:  83 y.o. 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 He had recent admission 11/29-12/4 with findings of new stroke. Initial plan was to discharge to CIR, however, insurance declined to pay. He was discharged home with PT/OT and had quick return due to respiratory symptoms. Palliative medicine consulted for goals of care.    Subjective: Chart reviewed including labs, progress notes, imaging from this and previous encounters. Patient had decline overnight with increase in symptom burden and was started on IV morphine  infusion.  He has had approx 34mg  of IV morphine  over the last 24 hours.  On evaluation he appears uncomfortable. Using accessory muscles for breathing.  I administered a 4mg  bolus of IV morphine  from infusion bag.  Family at bedside are grateful for care he is receiving. They had no questions.    Review of Systems  Unable to perform ROS: Mental status change     Physical Exam Vitals and nursing note reviewed.  Constitutional:      Appearance: He is ill-appearing and toxic-appearing.     Comments: frail  Cardiovascular:     Rate and Rhythm: Tachycardia present.  Pulmonary:     Comments: Increased work of breathing Skin:    Coloration: Skin is pale.  Neurological:     Comments: unresponsive              Vital Signs: BP 128/81 (BP Location: Left Arm)   Pulse (!) 109   Temp 98.6 F (37 C) (Oral)   Resp 20   Ht 5' 7 (1.702 m)   Wt 63.5 kg   SpO2 90%   BMI 21.93 kg/m  SpO2: SpO2: 90 % O2 Device: O2 Device: Nasal Cannula O2 Flow Rate: O2 Flow Rate (L/min): 2 L/min  Intake/output summary:  Intake/Output Summary (Last 24 hours) at 2024-06-11 1300 Last data filed at 06-11-24 0900 Gross per 24 hour  Intake 8.76 ml  Output 250 ml  Net -241.24 ml   LBM: Last BM Date : 06/07/24 Baseline Weight: Weight: 63.5 kg Most recent weight: Weight: 63.5 kg       Palliative Assessment/Data: PPS: 10%      Patient Active Problem List   Diagnosis Date Noted   Aspiration pneumonia (HCC) 06/03/2024   Failure to thrive in adult 06/03/2024   HCAP (healthcare-associated pneumonia) 06/02/2024   Encephalopathy acute 05/27/2024   Stroke (HCC) 05/27/2024   Protein-calorie malnutrition, severe 05/03/2024   Sepsis due to pneumonia (HCC) 05/01/2024   Urothelial carcinoma (HCC) 04/04/2024   Renal mass, left 03/23/2024   Back pain 03/23/2024   Elevated alkaline phosphatase level 04/23/2022   History of ERCP 04/23/2022   Colon cancer screening 04/23/2022   Calculus of gallbladder without cholecystitis without obstruction    Acute cholangitis (HCC) 02/09/2021   Pancreatitis due to biliary obstruction/S/p ERCP with stent Placement on 02/06/21 02/09/2021   Essential hypertension 02/07/2021   Cholangitis (HCC)    Choledocholithiasis/Cholelithiasis--S/p ERCP with Plastic Stent Placement on 02/06/21 02/04/2021   Hyperglycemia 02/04/2021   Intermittent abdominal pain 02/04/2021   Early satiety 02/04/2021   Abnormal weight loss 02/04/2021   Hyperbilirubinemia 02/04/2021   Elevated liver enzymes 02/04/2021   Hematemesis 02/04/2021   Iron deficiency anemia    Transaminitis     Palliative Care Assessment & Plan    Assessment/Recommendations/Plan  Pneumonia- likely aspiration, in setting of  history of stroke, and stage IV urothelia cancer- appears to be worsening, transitioned to comfort care Actively dying- continue IV morphine  infusion, start bolus doses 2-10mg  q13min prn- give bolus dose equal to infusion rate.   Code Status:   Code Status: Do not attempt resuscitation (DNR) - Comfort care   Prognosis:  Hours - Days  Discharge Planning: Home with Hospice  Care plan was discussed with patient, family, and care team.   Thank you for allowing the Palliative Medicine Team to assist in the care of this patient.  Total time:  Prolonged billing:  Time includes:   Preparing to see the patient (e.g., review of tests) Obtaining and/or reviewing separately obtained history Performing a medically necessary appropriate examination and/or evaluation Counseling and educating the patient/family/caregiver Ordering medications, tests, or procedures Referring and communicating with other health care professionals (when not reported separately) Documenting clinical information in the electronic or other health record Independently interpreting results (not reported separately) and communicating results to the patient/family/caregiver Care coordination (not reported separately) Clinical documentation  Cassondra Stain, AGNP-C Palliative Medicine   Please contact Palliative Medicine Team phone at [phone] for questions and concerns.

## 2024-06-08 NOTE — Progress Notes (Addendum)
 PROGRESS NOTE  Dean Randolph FMW:[id number] DOB: 14-Dec-1940 DOA: 06/02/2024 PCP: Davonna Siad, MD  Brief History:  83 y.o. 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  Daughter at bedside states that patient has had a mild cough prior to being discharged, this worsened (a day after being discharged) after eating a meal at home, cough was productive with yellowish-green sputum and it was associated with shortness of breath when he has a coughing fit.   ED course In the emergency department, patient was tachypneic, tachycardic, temperature was 97.5 F, O2 sat was 95 to 98% on 3 LPM of oxygen (patient does not use oxygen at baseline).  Workup in the ED showed normocytic anemia and leukocytosis.  BMP was normal except for blood glucose of 132 lactic acid 3.3 > 2.0.  Urinalysis was suspicious for UTI.  Urine culture was positive for Stenotrophomonas maltophilia.  Chest x-ray was suggestive of worsening edema or infection.  EKG personally reviewed which showed wide QRS tachycardia heart rate of 148 bpm with RBBB.  Patient was treated with IV vancomycin  and cefepime , IV hydration.  Unfortunately, the patient continued to have signs of aspiration.  He continued to have recurrent signs of fever and SIRS during hospitalization despite being on antibiotics.  His antibiotics were broadened to Zosyn .  He was continued on Bactrim  DS.  Palliative medicine was consulted.  The patient remained very deconditioned without significant improvement.  Goals of care were discussed with the patient and family.  Ultimately, patient's family wanted to transition him to comfort measures.  TOC assisted in transitioning patient home with hospice care.    Assessment/Plan: Aspiration pneumonia  -- continues to have fever and SIRS with signs of aspiration -- continue aspiration precautions -- SLP evaluation s/p MBS -- dysphagia diet as ordered -- adding chest PT and duonebs to help clear secretions if possible -- after MBS placed on dys 3 diet, thin liquids by SLP  -- he remains high risk for recurrent aspiration, multiple discussions with family -- after final discussion of the day today, they seem open to taking him home with hospice and accepting of necessary hospice equipment - zosyn  was started on 12/9, but now d/c with transition to comfort measures - 12/10 now will accept pt for comfort feeds as goals of care now focused on comfort   Lactic acidosis  -- resolved with treatments   Stenotrophomonas maltophilia UTI  -- started on Bactrim  DS initially - now full comfort measures   Essential hypertension  --- BPs stable, follow - now full comfort measures   Urothelial carcinoma -- metastasis to bone and lymph nodes -- he hasn't been able to get his palliative radiation due to being hospitalized -- he is on chemo treatments per oncology -- goal of care is to resume chemo and radiation -- anticipating he will go home with hospice -- family requested oncology consult and [facility] reached out to me for permission and I was happy to have them consult and weigh in to give family more information - appreciate med/onc consult    GERD  -- pantoprazole  for GI protection    Urinary retention  -- foley cath was changed on admission -- tamsulosin  daily ordered   Protein calorie malnutrition  --  agree with meal supplementation as ordered    Goal of care -- long discussion with family on 12/7 about patient's precipitous decline in last months especially after recent CVA. --continued GOC with palliative medicine>>transition to comfort measures on 12/10 - 06/07/2024 evening--patient started on morphine  drip>> continue.  Give  additional boluses to manage distress and pain.               Family Communication:   wife/daughter at bedside 06-Jul-2024   Consultants:  palliative   Code Status:  FULL COMFORT   DVT Prophylaxis:  FULL COMFORT     Procedures: As Listed in Progress Note Above   Antibiotics: Zosyn  12/9>>12/10 Bactrim  DS 12/6>>12/10          Subjective:  Patient is resting comfortably.  There is no respiratory distress.  No uncontrolled pain. Objective: Vitals:   06/07/24 0610 06/07/24 0915 06/07/24 1226 06/07/24 1359  BP: 126/65  128/81   Pulse: (!) 104  (!) 109   Resp: 20     Temp: 98.1 F (36.7 C)  98.6 F (37 C)   TempSrc: Axillary  Oral   SpO2: 92% (!) 89% 91% 90%  Weight:      Height:        Intake/Output Summary (Last 24 hours) at 2024-07-06 1817 Last data filed at 2024/07/06 0900 Gross per 24 hour  Intake 8.76 ml  Output 250 ml  Net -241.24 ml   Weight change:  Exam:  General:  Pt is somnolent without distress. HEENT: No icterus, [place]/AT Cardiovascular: RRR, S1/S2, no rubs, no gallops Respiratory: Bilateral rales. Abdomen: Soft/+BS, non distended Extremities: No edema, No lymphangitis, No petechiae, No rashes, no synovitis   Data Reviewed: I have personally reviewed following labs and imaging studies Basic Metabolic Panel: Recent Labs  Lab 06/03/24 0406 06/04/24 0450 06/05/24 0443 06/06/24 0421 06/07/24 0555  NA 137 136 135 136 138  K 3.4* 3.4* 3.3* 3.9 3.4*  CL 103 103 103 102 103  CO2 25 24 19* 23 24  GLUCOSE 108* 97 95 100* 89  BUN 22 19 18 18 20   CREATININE 0.81 0.76 0.78 0.74 0.94  CALCIUM  8.4* 8.6* 8.5* 8.7* 8.8*  MG 2.0 1.9  --   --   --   PHOS 3.3  --   --   --   --    Liver Function Tests: Recent Labs  Lab 06/02/24 2022 06/03/24 0406  AST 45* 32  ALT 34 25  ALKPHOS 208* 158*  BILITOT 0.5 0.4  PROT 7.5 6.0*  ALBUMIN 3.3* 2.7*   No results for input(s): LIPASE, AMYLASE in the last 168 hours. No results for input(s):  AMMONIA in the last 168 hours. Coagulation Profile: Recent Labs  Lab 06/02/24 2022  INR 1.2   CBC: Recent Labs  Lab 06/02/24 2022 06/03/24 0406 06/04/24 0450 06/05/24 0443 06/06/24 0421 06/07/24 0555  WBC 14.1* 14.4* 11.8* 9.0 8.2 9.6  NEUTROABS 12.7*  --  10.0* 7.2 7.4 8.2*  HGB 10.4* 8.7* 8.6* 8.3* 9.2* 9.2*  HCT 33.3* 27.6* 28.0* 26.0* 29.1* 29.7*  MCV 97.1 95.5 94.3 94.9 92.4 94.0  PLT 394 301 267 256 248 231   Cardiac Enzymes: No results for input(s): CKTOTAL, CKMB, CKMBINDEX, TROPONINI in the last 168 hours. BNP: Invalid input(s): POCBNP CBG: No results for input(s): GLUCAP in the last 168 hours. HbA1C: No results for input(s): HGBA1C in the last 72 hours. Urine analysis:    Component Value Date/Time   COLORURINE YELLOW 06/02/2024 2022  APPEARANCEUR CLOUDY (A) 06/02/2024 2022   LABSPEC 1.016 06/02/2024 2022   PHURINE 5.0 06/02/2024 2022   GLUCOSEU NEGATIVE 06/02/2024 2022   HGBUR MODERATE (A) 06/02/2024 2022   BILIRUBINUR NEGATIVE 06/02/2024 2022   KETONESUR NEGATIVE 06/02/2024 2022   PROTEINUR 100 (A) 06/02/2024 2022   NITRITE NEGATIVE 06/02/2024 2022   LEUKOCYTESUR LARGE (A) 06/02/2024 2022   Sepsis Labs: @LABRCNTIP (procalcitonin:4,lacticidven:4) ) Recent Results (from the past 240 hours)  Culture, blood (Routine x 2)     Status: None   Collection Time: 06/02/24  8:22 PM   Specimen: Right Antecubital; Blood  Result Value Ref Range Status   Specimen Description RIGHT ANTECUBITAL  Final   Special Requests   Final    BOTTLES DRAWN AEROBIC AND ANAEROBIC Blood Culture adequate volume   Culture   Final    NO GROWTH 5 DAYS Performed at [facility], [address], [place], [place] [address]    Report Status 06/07/2024 FINAL  Final  Urine Culture     Status: Abnormal (Preliminary result)   Collection Time: 06/02/24  8:22 PM   Specimen: Urine, Random  Result Value Ref Range Status   Specimen Description   Final    URINE,  RANDOM Performed at [facility], [address] [place], [place] [address]    Special Requests   Final    NONE Reflexed from [id number] Performed at [facility], [address], [place], [place] [address]    Culture (A)  Final    >=100,000 COLONIES/mL STENOTROPHOMONAS MALTOPHILIA Sent to [facility] for further susceptibility testing. Performed at [facility], [address], [place], [place] [address]    Report Status PENDING  Incomplete  Susceptibility, Aer + Anaerob     Status: Abnormal   Collection Time: 06/02/24  8:22 PM  Result Value Ref Range Status   Suscept, Aer + Anaerob Final report (A)  Corrected    Comment: (NOTE) Performed At: [facility] [address] [place], [place] [id number] Jennette Shorter MD Ey:[id number] CORRECTED ON Jul 02, 2024 AT 0936: PREVIOUSLY REPORTED AS Preliminary report    Source STENOTROPHOMONAS MALTOPHILIA/ URINE  Corrected    Comment: Performed at [facility], [address] [place], [place] [address] CORRECTED ON 12/08 AT 0844: PREVIOUSLY REPORTED AS URINE    Susceptibility Result     Status: Abnormal   Collection Time: 06/02/24  8:22 PM  Result Value Ref Range Status   Suscept Result 1 Comment (A)  Final    Comment: (NOTE) Stenotrophomonas maltophilia Identification performed by account, not confirmed by this laboratory. Testing performed by broth microdilution.    Antimicrobial Suscept Comment  Corrected    Comment: (NOTE)      ** S = Susceptible; I = Intermediate; R = Resistant **                   P = Positive; N = Negative            MICS are expressed in micrograms per mL   Antibiotic                 RSLT#1    RSLT#2    RSLT#3    RSLT#4 Ceftazidime                    S Levofloxacin                    S Minocycline  S Trimethoprim /Sulfa              S Performed At: [facility] [address] [place], [place] [id number] Jennette Shorter MD Ey:[id number]   Culture, blood (Routine x 2)      Status: None   Collection Time: 06/02/24  8:42 PM   Specimen: BLOOD RIGHT ARM  Result Value Ref Range Status   Specimen Description BLOOD RIGHT ARM  Final   Special Requests   Final    BOTTLES DRAWN AEROBIC AND ANAEROBIC Blood Culture adequate volume   Culture   Final    NO GROWTH 5 DAYS Performed at [facility], [address] [place], [place] [address]    Report Status 06/07/2024 FINAL  Final  Culture, blood (Routine X 2) w Reflex to ID Panel     Status: None (Preliminary result)   Collection Time: 06/06/24  5:10 AM   Specimen: BLOOD  Result Value Ref Range Status   Specimen Description BLOOD RIGHT ANTECUBITAL  Final   Special Requests   Final    BOTTLES DRAWN AEROBIC AND ANAEROBIC Blood Culture adequate volume   Culture   Final    NO GROWTH 2 DAYS Performed at [facility], [address], [place], [place] [address]    Report Status PENDING  Incomplete  Culture, blood (Routine X 2) w Reflex to ID Panel     Status: None (Preliminary result)   Collection Time: 06/06/24  5:12 AM   Specimen: BLOOD  Result Value Ref Range Status   Specimen Description BLOOD LEFT ANTECUBITAL  Final   Special Requests   Final    BOTTLES DRAWN AEROBIC AND ANAEROBIC Blood Culture adequate volume   Culture   Final    NO GROWTH 2 DAYS Performed at [facility], [address], [place], [place] [address]    Report Status PENDING  Incomplete  MRSA Next Gen by PCR, Nasal     Status: None   Collection Time: 06/06/24  6:46 AM   Specimen: Nasal Mucosa; Nasal Swab  Result Value Ref Range Status   MRSA by PCR Next Gen NOT DETECTED NOT DETECTED Final    Comment: (NOTE) The GeneXpert MRSA Assay (FDA approved for NASAL specimens only), is one component of a comprehensive MRSA colonization surveillance program. It is not intended to diagnose MRSA infection nor to guide or monitor treatment for MRSA infections. Test performance is not FDA approved in patients less than 22 years old. Performed at [facility], [address] [place], [place] [address]      Scheduled Meds: Continuous Infusions:  morphine  4 mg/hr (June 17, 2024 1029)    Procedures/Studies: DG Swallowing Func-Speech Pathology Result Date: 06/05/2024 Table formatting from the original result was not included. Modified Barium Swallow Study Patient Details Name: Dean Randolph MRN: [id number] Date of Birth: 1940-10-25 Today's Date: 06/05/2024 HPI/PMH: HPI: 83 y.o. 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 He had recent admission 11/29-12/4 with findings of new stroke. Initial plan was to discharge to CIR, however, insurance declined to pay. He was discharged home with PT/OT and had quick return due to respiratory symptoms. BSE completed yesterday with recommendation for MBS. Clinical Impression: Clinical Impression: MBSS completed during acute stay and Pt was assessed in the lateral position with barium tinged thin, nectar, honey, puree, regular textures, and a barium tablet in puree. Pt presents with mi/mod  oropharyngeal phase dysphagia with suspected esophageal component. Oral phase is marked by reduced labial closure and mild lingual weakness with residual post swallow; pharyngeal phase is marked by swallow trigger after filling the valleculae with thins, min reduced tonge base retraction and hyolaryngeal excursion, and incomplete epiglottic deflection resulting in residuals in the valleculae which then spill to the lateral channels and pyriforms, and trace penetration of thins after the swallow. Pt was noted to have aspiration of secretions mixed with barium after he consumes honey thick liquids followed by puree and regular textures. Pt with congested cough and phlegm tinged with barium was noted to fall beneath the vocal folds and then expelled with a cough and swallowed. The barium  tablet was presented in puree and briefly stopped in the valleculae before traversiing the esophagus and delayed in what appeared to be a hiatal hernia (no radiologist present to confirm). Min backflow of barium noted during esophageal sweep. Recommend D3/mech soft and thin liquids via cup/straw sips when Pt is alert and upright and swallow 2-3x for each bite/sip, pills whole in puree, and remain upright for ~30 minutes post meals for reflux precautions. SLP will follow during acute stay to review recommendations with Pt and family. Factors that may increase risk of adverse event in presence of aspiration Noe & Lianne 2021): Factors that may increase risk of adverse event in presence of aspiration Noe & Lianne 2021): Frail or deconditioned Recommendations/Plan: Swallowing Evaluation Recommendations Swallowing Evaluation Recommendations Recommendations: PO diet PO Diet Recommendation: Dysphagia 3 (Mechanical soft); Thin liquids (Level 0) Liquid Administration via: Straw; Cup Medication Administration: Whole meds with puree Supervision: Patient able to self-feed; Intermittent supervision/cueing for swallowing strategies Swallowing strategies  : Slow rate; Small bites/sips; Multiple dry swallows after each bite/sip Postural changes: Position pt fully upright for meals; Stay upright 30-60 min after meals Oral care recommendations: Oral care BID (2x/day); Staff/trained caregiver to provide oral care Treatment Plan Treatment Plan Treatment recommendations: Therapy as outlined in treatment plan below Follow-up recommendations: Follow physicians's recommendations for discharge plan and follow up therapies Functional status assessment: Patient has had a recent decline in their functional status and demonstrates the ability to make significant improvements in function in a reasonable and predictable amount of time. Treatment frequency: Min 2x/week Treatment duration: 1 week Interventions: Aspiration precaution  training; Compensatory techniques; Patient/family education; Diet toleration management by SLP Recommendations Recommendations for follow up therapy are one component of a multi-disciplinary discharge planning process, led by the attending physician.  Recommendations may be updated based on patient status, additional functional criteria and insurance authorization. Assessment: Orofacial Exam: Orofacial Exam Oral Cavity: Oral Hygiene: WFL Oral Cavity - Dentition: Missing dentition Orofacial Anatomy: WFL Oral Motor/Sensory Function: WFL Anatomy: Anatomy: WFL Boluses Administered: Boluses Administered Boluses Administered: Thin liquids (Level 0); Mildly thick liquids (Level 2, nectar thick); Moderately thick liquids (Level 3, honey thick); Puree; Solid  Oral Impairment Domain: Oral Impairment Domain Lip Closure: Escape progressing to mid-chin Tongue control during bolus hold: Escape to lateral buccal cavity/floor of mouth Bolus preparation/mastication: Slow prolonged chewing/mashing with complete recollection Bolus transport/lingual motion: Delayed initiation of tongue motion (oral holding) Oral residue: Trace residue lining oral structures Location of oral residue : Tongue Initiation of pharyngeal swallow : Valleculae  Pharyngeal Impairment Domain: Pharyngeal Impairment Domain Soft palate elevation: Trace column of contrast or air between SP and PW Laryngeal elevation: Partial superior movement of thyroid  cartilage/partial approximation of arytenoids to epiglottic petiole Anterior hyoid excursion: Partial anterior movement Epiglottic movement: Partial inversion Laryngeal vestibule  closure: Incomplete, narrow column air/contrast in laryngeal vestibule Pharyngeal stripping wave : Present - complete Pharyngeal contraction (A/P view only): N/A Pharyngoesophageal segment opening: Complete distension and complete duration, no obstruction of flow Tongue base retraction: Narrow column of contrast or air between tongue base  and PPW Pharyngeal residue: Collection of residue within or on pharyngeal structures Location of pharyngeal residue: Valleculae; Pyriform sinuses; Tongue base  Esophageal Impairment Domain: Esophageal Impairment Domain Esophageal clearance upright position: Esophageal retention with retrograde flow below pharyngoesophageal segment (PES) Pill: Pill Consistency administered: Puree Puree: Impaired (see clinical impressions) Penetration/Aspiration Scale Score: Penetration/Aspiration Scale Score 1.  Material does not enter airway: Puree; Solid; Pill; Mildly thick liquids (Level 2, nectar thick) 3.  Material enters airway, remains ABOVE vocal cords and not ejected out: Thin liquids (Level 0) 6.  Material enters airway, passes BELOW cords then ejected out: Moderately thick liquids (Level 3, honey thick) (see impression statement- mixed with secretions/phlegm) Compensatory Strategies: Compensatory Strategies Compensatory strategies: Yes Multiple swallows: Effective Effective Multiple Swallows: Thin liquid (Level 0); Mildly thick liquid (Level 2, nectar thick); Moderately thick liquid (Level 3, honey thick); Puree   General Information: Caregiver present: No  Diet Prior to this Study: Dysphagia 2 (finely chopped); Thin liquids (Level 0)   Temperature : Normal   Respiratory Status: WFL   Supplemental O2: None (Room air)   History of Recent Intubation: No  Behavior/Cognition: Alert; Cooperative Self-Feeding Abilities: Able to self-feed Baseline vocal quality/speech: Normal Volitional Cough: Able to elicit Volitional Swallow: Able to elicit Exam Limitations: No limitations (some limitations with shoulder) Goal Planning: Prognosis for improved oropharyngeal function: Good No data recorded No data recorded Patient/Family Stated Goal: return home healthy Consulted and agree with results and recommendations: Patient Pain: Pain Assessment Pain Assessment: No/denies pain End of Session: Start Time:SLP Start Time (ACUTE ONLY): 1312  Stop Time: SLP Stop Time (ACUTE ONLY): 1342 Time Calculation:SLP Time Calculation (min) (ACUTE ONLY): 30 min Charges: SLP Evaluations $ SLP Speech Visit: 1 Visit SLP Evaluations $BSS Swallow: 1 Procedure $MBS Swallow: 1 Procedure SLP visit diagnosis: SLP Visit Diagnosis: Dysphagia, oropharyngeal phase (R13.12) Past Medical History: Past Medical History: Diagnosis Date  Bladder cancer (HCC)   Hypertension  Past Surgical History: Past Surgical History: Procedure Laterality Date  BILIARY DILATION  03/12/2022  Procedure: BILIARY DILATION;  Surgeon: Mansouraty, Aloha Raddle., MD;  Location: [facility];  Service: Gastroenterology;;  BILIARY STENT PLACEMENT N/A 02/06/2021  Procedure: BILIARY STENT PLACEMENT;  Surgeon: Golda Claudis PENNER, MD;  Location: [facility];  Service: Gastroenterology;  Laterality: N/A;  BILIARY STENT PLACEMENT N/A 03/26/2021  Procedure: BILIARY STENT PLACEMENT 10 FRENCH BY 9cm;  Surgeon: Golda Claudis PENNER, MD;  Location: [facility];  Service: Endoscopy;  Laterality: N/A;  BILIARY STENT PLACEMENT N/A 01/19/2022  Procedure: BILIARY STENT PLACEMENT;  Surgeon: Wilhelmenia Aloha Raddle., MD;  Location: [facility];  Service: Gastroenterology;  Laterality: N/A;  BIOPSY  01/19/2022  Procedure: BIOPSY;  Surgeon: Wilhelmenia Aloha Raddle., MD;  Location: [facility];  Service: Gastroenterology;;  CATARACT EXTRACTION Bilateral   CHOLECYSTECTOMY N/A 04/25/2021  Procedure: LAPAROSCOPIC CHOLECYSTECTOMY;  Surgeon: Mavis Anes, MD;  Location: [facility];  Service: General;  Laterality: N/A;  ENDOSCOPIC RETROGRADE CHOLANGIOPANCREATOGRAPHY (ERCP) WITH PROPOFOL  N/A 01/19/2022  Procedure: ENDOSCOPIC RETROGRADE CHOLANGIOPANCREATOGRAPHY (ERCP) WITH PROPOFOL ;  Surgeon: Wilhelmenia Aloha Raddle., MD;  Location: [facility];  Service: Gastroenterology;  Laterality: N/A;  ENDOSCOPIC RETROGRADE CHOLANGIOPANCREATOGRAPHY (ERCP) WITH PROPOFOL  N/A 03/12/2022  Procedure: ENDOSCOPIC RETROGRADE CHOLANGIOPANCREATOGRAPHY (ERCP) WITH PROPOFOL ;   Surgeon: Wilhelmenia Aloha Raddle., MD;  Location:  [facility];  Service: Gastroenterology;  Laterality: N/A;  ERCP N/A 02/06/2021  Procedure: ENDOSCOPIC RETROGRADE CHOLANGIOPANCREATOGRAPHY (ERCP);  Surgeon: Golda Claudis PENNER, MD;  Location: [facility];  Service: Gastroenterology;  Laterality: N/A;  ERCP N/A 03/26/2021  Procedure: ENDOSCOPIC RETROGRADE CHOLANGIOPANCREATOGRAPHY (ERCP) with EXTENDED SPHINCTEROTOMY;  Surgeon: Golda Claudis PENNER, MD;  Location: [facility];  Service: Endoscopy;  Laterality: N/A;  ERCP N/A 10/27/2021  Procedure: ENDOSCOPIC RETROGRADE CHOLANGIOPANCREATOGRAPHY (ERCP);  Surgeon: Golda Claudis PENNER, MD;  Location: [facility];  Service: Endoscopy;  Laterality: N/A;  no site   GASTROINTESTINAL STENT REMOVAL N/A 03/26/2021  Procedure: GASTROINTESTINAL STENT REMOVAL;  Surgeon: Golda Claudis PENNER, MD;  Location: [facility];  Service: Endoscopy;  Laterality: N/A;  GASTROINTESTINAL STENT REMOVAL N/A 10/27/2021  Procedure: GASTROINTESTINAL STENT EXCHANGE;  Surgeon: Golda Claudis PENNER, MD;  Location: [facility];  Service: Endoscopy;  Laterality: N/A;  no site   REMOVAL OF STONES N/A 03/26/2021  Procedure: REMOVAL OF STONES WITH EXTRACTION BASKET;  Surgeon: Golda Claudis PENNER, MD;  Location: [facility];  Service: Endoscopy;  Laterality: N/A;  REMOVAL OF STONES N/A 10/27/2021  Procedure: REMOVAL OF STONES;  Surgeon: Golda Claudis PENNER, MD;  Location: [facility];  Service: Endoscopy;  Laterality: N/A;  REMOVAL OF STONES  01/19/2022  Procedure: REMOVAL OF STONES;  Surgeon: Wilhelmenia Aloha Raddle., MD;  Location: [facility];  Service: Gastroenterology;;  REMOVAL OF STONES  03/12/2022  Procedure: REMOVAL OF STONES;  Surgeon: Wilhelmenia Aloha Raddle., MD;  Location: [facility];  Service: Gastroenterology;;  ANNETT N/A 02/06/2021  Procedure: ANNETT;  Surgeon: Golda Claudis PENNER, MD;  Location: [facility];  Service: Gastroenterology;  Laterality: N/A;  SPHINCTEROTOMY N/A 10/27/2021  Procedure: SPHINCTEROTOMY;  Surgeon: Golda Claudis PENNER,  MD;  Location: [facility];  Service: Endoscopy;  Laterality: N/A;  SPYGLASS CHOLANGIOSCOPY N/A 01/19/2022  Procedure: SPYGLASS CHOLANGIOSCOPY;  Surgeon: Wilhelmenia Aloha Raddle., MD;  Location: [facility];  Service: Gastroenterology;  Laterality: N/A;  SPYGLASS CHOLANGIOSCOPY N/A 03/12/2022  Procedure: DEBHOJDD CHOLANGIOSCOPY;  Surgeon: Wilhelmenia Aloha Raddle., MD;  Location: [facility];  Service: Gastroenterology;  Laterality: N/A;  SPYGLASS LITHOTRIPSY N/A 03/12/2022  Procedure: DEBHOJDD LITHOTRIPSY;  Surgeon: Wilhelmenia Aloha Raddle., MD;  Location: [facility];  Service: Gastroenterology;  Laterality: N/A;  STENT REMOVAL  01/19/2022  Procedure: STENT REMOVAL;  Surgeon: Wilhelmenia Aloha Raddle., MD;  Location: [facility];  Service: Gastroenterology;;  CLEDA REMOVAL  03/12/2022  Procedure: STENT REMOVAL;  Surgeon: Wilhelmenia Aloha Raddle., MD;  Location: [facility];  Service: Gastroenterology;;  STONE EXTRACTION WITH BASKET  01/19/2022  Procedure: STONE EXTRACTION WITH BASKET;  Surgeon: Wilhelmenia Aloha Raddle., MD;  Location: [facility];  Service: Gastroenterology;; Thank you, Lamar Candy, CCC-SLP [phone] PORTER,DABNEY 06/05/2024, 2:46 PM  DG CHEST PORT 1 VIEW Result Date: 06/05/2024 CLINICAL DATA:  Aspiration pneumonia. EXAM: PORTABLE CHEST 1 VIEW COMPARISON:  06/02/2024 FINDINGS: Cardiopericardial silhouette is at upper limits of normal for size. Interstitial with diffuse patchy bilateral airspace disease is similar to prior. No substantial pleural effusion. Telemetry leads overlie the chest. IMPRESSION: No substantial interval change. Diffuse patchy bilateral airspace disease compatible with asymmetric edema or diffuse infection. Electronically Signed   By: Camellia Candle M.D.   On: 06/05/2024 05:47   DG Chest Port 1 View if patient is in a treatment room. Result Date: 06/02/2024 CLINICAL DATA:  Cough, sepsis, short of breath EXAM: PORTABLE CHEST 1 VIEW COMPARISON:  05/21/2024 FINDINGS: Single  frontal view of the chest demonstrates a stable cardiac silhouette. Lung volumes are diminished, with continued pulmonary vascular congestion. Interval development of  patchy bilateral airspace disease greatest at the left lung base, which may reflect edema or infection. No effusion or pneumothorax. No acute bony abnormalities. IMPRESSION: 1. Low lung volumes, with vascular congestion and patchy bilateral airspace disease left greater than right. Findings may reflect worsening edema or infection. Electronically Signed   By: Ozell Daring M.D.   On: 06/02/2024 20:31   ECHOCARDIOGRAM COMPLETE Result Date: 05/28/2024    ECHOCARDIOGRAM REPORT   Patient Name:   Dean Randolph Date of Exam: 05/28/2024 Medical Rec #:  [id number]       Height:       69.0 in Accession #:    [id number]      Weight:       144.0 lb Date of Birth:  May 18, 1941      BSA:          1.797 m Patient Age:    83 years        BP:           142/75 mmHg Patient Gender: M               HR:           100 bpm. Exam Location:  [name] Procedure: 2D Echo, Cardiac Doppler and Color Doppler (Both Spectral and Color            Flow Doppler were utilized during procedure). Indications:    Stroke I63.9  History:        Patient has no prior history of Echocardiogram examinations.                 Stroke and cancer, Signs/Symptoms:Altered Mental Status; Risk                 Factors:Hypertension and Dyslipidemia.  Sonographer:    Koleen Popper RDCS Referring Phys: [phone] JACOB J STINSON IMPRESSIONS  1. Left ventricular ejection fraction, by estimation, is 55 to 60%. The left ventricle has normal function. The left ventricle has no regional wall motion abnormalities. Left ventricular diastolic parameters were normal.  2. Right ventricular systolic function is normal. The right ventricular size is normal.  3. The mitral valve is normal in structure. No evidence of mitral valve regurgitation. No evidence of mitral stenosis.  4. The aortic valve is tricuspid. There  is mild calcification of the aortic valve. There is mild thickening of the aortic valve. Aortic valve regurgitation is not visualized. Aortic valve sclerosis/calcification is present, without any evidence of aortic stenosis. FINDINGS  Left Ventricle: Left ventricular ejection fraction, by estimation, is 55 to 60%. The left ventricle has normal function. The left ventricle has no regional wall motion abnormalities. The left ventricular internal cavity size was normal in size. There is  no left ventricular hypertrophy. Left ventricular diastolic parameters were normal. Right Ventricle: The right ventricular size is normal. No increase in right ventricular wall thickness. Right ventricular systolic function is normal. Left Atrium: Left atrial size was normal in size. Right Atrium: Right atrial size was normal in size. Pericardium: There is no evidence of pericardial effusion. Mitral Valve: The mitral valve is normal in structure. Mild mitral annular calcification. No evidence of mitral valve regurgitation. No evidence of mitral valve stenosis. Tricuspid Valve: The tricuspid valve is normal in structure. Tricuspid valve regurgitation is not demonstrated. Aortic Valve: The aortic valve is tricuspid. There is mild calcification of the aortic valve. There is mild thickening of the aortic valve. Aortic valve regurgitation is not visualized. Aortic valve sclerosis/calcification is  present, without any evidence of aortic stenosis. Pulmonic Valve: The pulmonic valve was grossly normal. Pulmonic valve regurgitation is not visualized. No evidence of pulmonic stenosis. Aorta: The aortic root and ascending aorta are structurally normal, with no evidence of dilitation. IAS/Shunts: No atrial level shunt detected by color flow Doppler.  LEFT VENTRICLE PLAX 2D LVIDd:         3.80 cm   Diastology LVIDs:         2.70 cm   LV e' medial:    5.22 cm/s LV PW:         1.00 cm   LV E/e' medial:  11.3 LV IVS:        1.10 cm   LV e' lateral:    8.49 cm/s LVOT diam:     1.80 cm   LV E/e' lateral: 7.0 LV SV:         43 LV SV Index:   24 LVOT Area:     2.54 cm  RIGHT VENTRICLE RV S prime:     20.10 cm/s TAPSE (M-mode): 2.0 cm LEFT ATRIUM           Index LA diam:      3.20 cm 1.78 cm/m LA Vol (A2C): 27.4 ml 15.25 ml/m  AORTIC VALVE LVOT Vmax:   106.00 cm/s LVOT Vmean:  65.200 cm/s LVOT VTI:    0.169 m  AORTA Ao Root diam: 3.10 cm Ao Asc diam:  3.20 cm MITRAL VALVE MV Area (PHT): 4.49 cm     SHUNTS MV Decel Time: 169 msec     Systemic VTI:  0.17 m MV E velocity: 59.10 cm/s   Systemic Diam: 1.80 cm MV A velocity: 109.00 cm/s MV E/A ratio:  0.54 Mihai Croitoru MD Electronically signed by Jerel Balding MD Signature Date/Time: 05/28/2024/2:10:34 PM    Final    CT ANGIO HEAD NECK W WO CM Result Date: 05/28/2024 EXAM: CTA HEAD AND NECK WITHOUT AND WITH 05/28/2024 09:09:58 AM TECHNIQUE: CTA of the head and neck was performed without and with the administration of 75 mL of intravenous iohexol  (OMNIPAQUE ) 350 MG/ML injection. Multiplanar 2D and/or 3D reformatted images are provided for review. Automated exposure control, iterative reconstruction, and/or weight based adjustment of the mA/kV was utilized to reduce the radiation dose to as low as reasonably achievable. Stenosis of the internal carotid arteries measured using NASCET criteria. COMPARISON: CT and MRI of the head dated 05/27/2024. Recent CT angiogram of the chest performed 05/21/2024. CLINICAL HISTORY: Neuro deficit, acute, stroke suspected. FINDINGS: CTA NECK: AORTIC ARCH AND ARCH VESSELS: There is mild calcific plaque within the aortic arch. No dissection or arterial injury. No significant stenosis of the brachiocephalic or subclavian arteries. CERVICAL CAROTID ARTERIES: There is calcific and noncalcific plaque present within the origin of the right internal carotid artery, particularly along the posterior wall, with approximately 40% luminal stenosis. The cervical segment of the right internal  carotid artery is otherwise normal in caliber. There is calcific and noncalcific plaque within the posterolateral origin of the left internal carotid artery, with less than 10% luminal stenosis. The remainder of the cervical segment is normal in caliber. No dissection or arterial injury. CERVICAL VERTEBRAL ARTERIES: The left vertebral artery is dominant. Both vertebral arteries are normal in caliber throughout their respective courses. No dissection, arterial injury, or significant stenosis. LUNGS AND MEDIASTINUM: Mediastinal lymphadenopathy, better demonstrated on recent CT angiogram of the chest performed 05/21/2024. SOFT TISSUES: No cervical lymphadenopathy. The patient is status post bilateral lens replacement.  No acute abnormality. BONES: No acute abnormality. CTA HEAD: ANTERIOR CIRCULATION: There is mild calcific plaque within the carotid siphons and communicating segments of the internal carotid arteries, with mild stenosis less than 30%. No significant stenosis of the anterior cerebral arteries. No significant stenosis of the middle cerebral arteries. No aneurysm. POSTERIOR CIRCULATION: There is moderate luminal irregularity of the P1 segment of the left posterior cerebral artery with moderate stenosis. There is mild-to-moderate stenosis of the P2 segment of the right posterior cerebral artery. No significant stenosis of the basilar artery. No significant stenosis of the vertebral arteries. No aneurysm. OTHER: Age-related atrophy and mild-to-moderate periventricular white matter disease. No dural venous sinus thrombosis on this non-dedicated study. IMPRESSION: 1. Moderate luminal irregularity of the P1 segment of the left posterior cerebral artery with moderate stenosis. 2. Mild-to-moderate stenosis of the P2 segment of the right posterior cerebral artery. 3. Calcific and noncalcific plaque within the origin of the right internal carotid artery, particularly along the posterior wall, with approximately 40%  luminal stenosis. The cervical segment is otherwise normal in caliber. 4. Calcific and noncalcific plaque within the posterolateral origin of the left internal carotid artery, with less than 10% luminal stenosis. The remainder of the cervical segment is normal in caliber. 5. Mild calcific plaque within the carotid siphons and communicating segments of the internal carotid arteries, with mild stenosis less than 30%. Electronically signed by: Evalene Coho MD 05/28/2024 09:58 AM EST RP Workstation: [id number]   MR Brain W and Wo Contrast Result Date: 05/27/2024 EXAM: MRI BRAIN WITH AND WITHOUT CONTRAST 05/27/2024 03:01:24 PM TECHNIQUE: Multiplanar multisequence MRI of the head/brain was performed with and without the administration of intravenous contrast. 6 mL (gadobutrol  (GADAVIST ) 1 MMOL/ML injection 6 mL GADOBUTROL  1 MMOL/ML IV SOLN). COMPARISON: Head CT 05/27/2024. CLINICAL HISTORY: Headache, history of cancer. FINDINGS: The examination is intermittently mildly to moderately motion degraded. BRAIN AND VENTRICLES: A 7 mm linear focus of mildly restricted diffusion involving posterior left occipital cortex and a 3 mm focus of mildly restricted diffusion involving right occipital subcortical white matter are consistent with acute to early subacute infarcts. Chronic microhemorrhages are noted in the left thalamus. T2 hyperintensities in the cerebral white matter bilaterally are nonspecific but compatible with mild chronic small vessel ischemic disease. There are chronic lacunar infarcts in the basal ganglia bilaterally. No mass, midline shift, hydrocephalus, extra axial fluid collection, or abnormal intracranial enhancement is identified. There is moderate cerebral atrophy. Major intracranial vascular flow voids are preserved. ORBITS: Bilateral cataract extraction. SINUSES: No acute abnormality. BONES AND SOFT TISSUES: Normal bone marrow signal and enhancement. No acute soft tissue abnormality. IMPRESSION: 1.  Small acute to early subacute bilateral occipital infarcts. 2. Mild chronic small vessel ischemic disease and moderate cerebral atrophy. 3. No evidence of intracranial metastatic disease. Electronically signed by: Dasie Hamburg MD 05/27/2024 03:46 PM EST RP Workstation: [id number]   CT Head Wo Contrast Result Date: 05/27/2024 EXAM: CT HEAD WITHOUT CONTRAST 05/27/2024 12:58:28 PM TECHNIQUE: CT of the head was performed without the administration of intravenous contrast. Automated exposure control, iterative reconstruction, and/or weight based adjustment of the mA/kV was utilized to reduce the radiation dose to as low as reasonably achievable. COMPARISON: None available. CLINICAL HISTORY: Mental status change, unknown cause. FINDINGS: BRAIN AND VENTRICLES: No acute hemorrhage. No evidence of acute infarct. No hydrocephalus. No extra-axial collection. No mass effect or midline shift. There is age-related atrophy and mild-to-moderate periventricular white matter disease. There is moderate calcific atheromatous disease within the carotid siphons. ORBITS:  No acute abnormality. Patient is status post bilateral lens replacement. SINUSES: No acute abnormality. SOFT TISSUES AND SKULL: No acute soft tissue abnormality. No skull fracture. IMPRESSION: 1. No acute intracranial abnormality. 2. Age-related atrophy and mild-to-moderate periventricular white matter disease. 3. Moderate calcific atheromatous disease within the carotid siphons. Electronically signed by: Evalene Coho MD 05/27/2024 01:02 PM EST RP Workstation: [id number]   CT ABDOMEN PELVIS W CONTRAST Result Date: 05/22/2024 EXAM: CT ABDOMEN AND PELVIS WITH CONTRAST 05/21/2024 11:55:59 PM TECHNIQUE: CT of the abdomen and pelvis was performed with the administration of 75 mL of iohexol  (OMNIPAQUE ) 350 MG/ML injection. Multiplanar reformatted images are provided for review. Automated exposure control, iterative reconstruction, and/or weight-based adjustment of  the mA/kV was utilized to reduce the radiation dose to as low as reasonably achievable. COMPARISON: 03/19/2024 CLINICAL HISTORY: workup for pyelo on the right side. Flank pain. Known metastatic disease. FINDINGS: LOWER CHEST: See chest CT report today. LIVER: Right hepatic cyst is unchanged. No suspicious focal hepatic abnormality. GALLBLADDER AND BILE DUCTS: Prior cholecystectomy. Pneumobilia is present. No biliary ductal dilatation. SPLEEN: No acute abnormality. PANCREAS: No acute abnormality. ADRENAL GLANDS: No acute abnormality. KIDNEYS, URETERS AND BLADDER: Left renal mass again noted measuring 3.9 x 3.2 cm compared to 4.1 x 3.4 cm previously. Enlarging left upper pole renal mass now measuring 3.1 x 2.6 cm compared to 1.8 x 1.3 cm previously. Perinephric metastatic nodules noted along the anterior perirenal fascia are again noted, unchanged. No stones in the kidneys or ureters. Small benign-appearing cysts in the right kidney are unchanged. No evidence of pyelonephritis. No hydronephrosis. No perinephric or periureteral stranding. Foley catheter within the bladder, which is decompressed. GI AND BOWEL: Stomach demonstrates no acute abnormality. There is no bowel obstruction. PERITONEUM AND RETROPERITONEUM: Extensive retroperitoneal adenopathy is unchanged. Retrocrural adenopathy is stable. Peritoneal metastases inferior to the left kidney measures 4.4 x 3.6 cm compared to 5.2 x 4.9 cm previously. No ascites. No free air. VASCULATURE: Aorta is normal in caliber. Aortic atherosclerosis. LYMPH NODES: Extensive retroperitoneal adenopathy is unchanged. Retrocrural adenopathy is stable. REPRODUCTIVE ORGANS: Mildly enlarged prostate. BONES AND SOFT TISSUES: Lytic lesions noted at T11, L1, and L2. Pelvic metastases seen on prior PET CT not as well visualized on today's CT. Right ischial tuberosity lytic lesion is unchanged. No focal soft tissue abnormality. IMPRESSION: 1. Enhancing left mid-pole renal mass is again  noted, not significantly changed. Enlarging enhancing mass in the upper pole of the left kidney compatible with second primary or metastatic focus. 2. Peritoneal nodules along the anterior pararenal fascia and inferior to the left kidney stable or slightly smaller since prior study. 3. Retrocrural and retroperitoneal adenopathy not significantly changed. 4. No acute abnormality within the right kidney. 5. Worsening osseous metastatic disease in the lower thoracic and upper lumbar spine. Electronically signed by: Franky Crease MD 05/22/2024 12:20 AM EST RP Workstation: [id number]   CT Angio Chest PE W and/or Wo Contrast Result Date: 05/22/2024 EXAM: CTA CHEST AORTA 05/21/2024 11:55:59 PM TECHNIQUE: CTA of the chest was performed after the administration of 75 mL of iohexol  (OMNIPAQUE ) 350 MG/ML injection. Multiplanar reformatted images are provided for review. MIP images are provided for review. Automated exposure control, iterative reconstruction, and/or weight based adjustment of the mA/kV was utilized to reduce the radiation dose to as low as reasonably achievable. COMPARISON: 03/19/2024, PET CT 03/30/2024. CLINICAL HISTORY: rule out PE/infiltrate. FINDINGS: AORTA: No thoracic aortic dissection. No aneurysm. MEDIASTINUM: Mediastinal adenopathy again noted. Subcarinal adenopathy is enlarged with a short axis diameter  of 2.2 cm compared to 1.7 cm previously. Large right paratracheal mass has a short axis diameter of 3.5 cm compared to 3.4 cm, not significantly changed. Prevascular lymph node has a short axis diameter of 1.8 cm compared to 1.6 cm previously. Bilateral hilar adenopathy stable. The heart and pericardium demonstrate no acute abnormality. LYMPH NODES: No axillary lymphadenopathy. Mediastinal and hilar adenopathy as described above. LUNGS AND PLEURA: Numerous bilateral pulmonary metastases again noted, the largest in the right lower lobe measuring 12 x 11 mm compared to 13 x 9 mm previously. Overall  pulmonary metastatic disease similar to prior study. Trace bilateral pleural effusions with bibasilar atelectasis. No pneumothorax. UPPER ABDOMEN: Stable moderate-sized hiatal hernia. SOFT TISSUES AND BONES: Right lateral 5th and 6th rib fractures noted, new since prior study. No acute soft tissue abnormality. IMPRESSION: 1. No evidence of pulmonary embolism. 2. Mediastinal and bilateral hilar adenopathy , mostly unchanged. However, subcarinal lymph nodes have enlarged since the prior study. 3. Numerous bilateral pulmonary metastases, overall similar to prior study. 4. Right lateral 5th and 6th rib fractures, new since prior study. Electronically signed by: Franky Crease MD 05/22/2024 12:08 AM EST RP Workstation: [id number]   DG Chest Portable 1 View Result Date: 05/21/2024 CLINICAL DATA:  Right-sided chest pain EXAM: PORTABLE CHEST 1 VIEW COMPARISON:  05/01/2024 FINDINGS: Single frontal view of the chest demonstrates an enlarged cardiac silhouette. Lung volumes are diminished, with increased central pulmonary vascular prominence. Increased density at the left lung base consistent with consolidation and/or effusion. No pneumothorax. No acute bony abnormalities. IMPRESSION: 1. Increased density at the left lung base consistent with consolidation and/or effusion. 2. Low lung volumes, with increased central pulmonary vascular congestion. 3. Enlarged cardiac silhouette. Electronically Signed   By: Ozell Daring M.D.   On: 05/21/2024 22:26    Alm Schneider, DO  [facility]  If 7PM-7AM, please contact night-coverage [email or web address] Password [email or web address] 2024-06-20, 6:17 PM   LOS: 6 days

## 2024-06-08 NOTE — Progress Notes (Signed)
°   06/26/24 1411  Spiritual Encounters  Type of Visit Follow up  Care provided to: Family   Chaplain checking-in with family as they keep vigil.  They appear at peace and report they have no needs at this time.  Maude Roll, MDiv Chaplain, [email or web address] [phone] 06/26/24 2:11 PM

## 2024-06-08 NOTE — Progress Notes (Signed)
 Patient is no longer breathing and has no pulse. Charge Nurse to bedside to confirm patient is deceased. Time of death 06-20-44 Dr Manfred notified. Postmortem flowshett completed. Funeral home notified [facility]  [address] [place] will pick body up from morgue asap. Notified [facility] spoke to Dorothe Pouch who states patient is automatic release case number [id number]. Patient placement notified . Patient foley catheter and iv's removed body cleaned and placed in transport bag toe tagged and bag tagged per protocol. Family has gone home were present during patient passing prefer main contact to be Channing Mckusick [phone]

## 2024-06-08 NOTE — Progress Notes (Signed)
 SPIRITUAL CARE AND COUNSELING CONSULT NOTE   VISIT SUMMARY   Reason for Visit: Chaplain responding to Spiritual Consult stating that Pt is on comfort care and likely at EOL- Family needing support.   Description of Visit: Upon entering the room I found Dean Randolph in the bed receiving treatment and his Family was there for support. His wife, son, daughter, son-in-law, and 2 grandchildren were present.  I began speaking with the wife and practice curious exploration creating space for her to share about their 39 year marriage and process her many emotions.  I remained in the room for approx, 45 minutes.  As the family had expressed a Christian faith and the hope for heaven after death, I offered a prayer of blessing over the Pt prior to leaving the room.  Pt is expected to pass today- I will continue to check in with family.   Plan of Care: I will plan to follow up with this Pt on a hourly basis   SPIRITUAL ENCOUNTER                                                                                                                                                                      Type of Visit: Follow up Care provided to:: Patient, Family Conversation partners present during encounter: Nurse Referral source: Nurse (RN/NT/LPN) Reason for visit: End-of-life OnCall Visit: No   SPIRITUAL FRAMEWORK  Presenting Themes: Meaning/purpose/sources of inspiration, Caregiving needs, Impactful experiences and emotions, Courage hope and growth Values/beliefs: Pt anbd Family maintian a hope of Heaven after this life Community/Connection: Family Strengths: Spirituality; Love; Courage   GOALS     Comfort care  INTERVENTIONS   Spiritual Care Interventions Made: Compassionate presence, Reflective listening, Narrative/life review, Explored values/beliefs/practices/strengths, Bereavement/grief support

## 2024-06-11 LAB — URINE CULTURE: Culture: >=100000 — AB

## 2024-06-11 LAB — CULTURE, BLOOD (ROUTINE X 2)
Culture: NO GROWTH
Culture: NO GROWTH
Special Requests: ADEQUATE
Special Requests: ADEQUATE

## 2024-06-12 ENCOUNTER — Inpatient Hospital Stay

## 2024-06-29 NOTE — Death Summary Note (Signed)
 DEATH SUMMARY   Patient Details  Name: Dean Randolph MRN: [id number] DOB: 03-20-1941 ERE:Xjwijoj, Mickiel, MD Admission/Discharge Information   Admit Date:  2024/06/14  Date of Death: Date of Death: 2024-06-20  Time of Death: Time of Death: June 23, 2044  Length of Stay: 6   Principle Cause of death: aspiration pneumonia  Hospital Diagnoses: Principal Problem:   HCAP (healthcare-associated pneumonia) Active Problems:   Aspiration pneumonia (HCC)   Failure to thrive in adult   Hospital Course: 84 y.o. 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  Daughter at bedside states that patient has had a mild cough prior to being discharged, this worsened (a day after being discharged) after eating a meal at home, cough was productive with yellowish-green sputum and it was associated with shortness of breath when he has a coughing fit.   ED course In the emergency department, patient was tachypneic, tachycardic, temperature was 97.5 F, O2 sat was 95 to 98% on 3 LPM of oxygen (patient does not use oxygen at baseline).  Workup in the ED showed normocytic anemia and leukocytosis.  BMP was normal except for blood glucose of 132 lactic acid 3.3 > 2.0.  Urinalysis was suspicious for UTI.  Urine culture was positive for Stenotrophomonas maltophilia.  Chest x-ray was suggestive of worsening edema or infection.  EKG personally reviewed which showed wide QRS tachycardia heart rate of 148 bpm with RBBB.  Patient was treated with IV vancomycin  and cefepime , IV hydration.  Unfortunately, the patient continued to have signs of aspiration.  He continued to have recurrent signs of fever and SIRS during hospitalization despite being on antibiotics.  His antibiotics were broadened  to Zosyn .  He was continued on Bactrim  DS.  Palliative medicine was consulted.  The patient remained very deconditioned without significant improvement.  Goals of care were discussed with the patient and family.  Ultimately, patient's family wanted to transition him to comfort measures.  TOC assisted in transitioning patient home with hospice care. Unfortunately, the patient's medical condition declined overnight from 06/07/2024 to 2024/06/20.  He had increased agitation and aspirational events.  The patient was subsequently placed on a morphine  drip.  Palliative medicine continued to follow and adjust medications for comfort.  After discussion with family, the patient was continued on full comfort measures with the expectation of in-hospital death.  Assessment and Plan: Aspiration pneumonia  -- continues to have fever and SIRS with signs of aspiration -- continue aspiration precautions -- SLP evaluation s/p MBS -- dysphagia diet as ordered -- adding chest PT and duonebs to help clear secretions if possible -- after MBS placed on dys 3 diet, thin liquids by SLP  -- he remains high risk for recurrent aspiration, multiple discussions with family -- after final discussion of the day today, they seem open to taking him home with hospice and accepting of necessary hospice equipment - zosyn  was started on 12/9, but now d/c with transition to comfort measures - 12/10 now will accept pt for comfort feeds as goals of care now focused on comfort   Lactic acidosis  -- resolved with treatments   Stenotrophomonas maltophilia UTI  -- started on Bactrim  DS initially - now full comfort measures   Essential hypertension  --- BPs stable, follow - now full comfort measures   Urothelial  carcinoma -- metastasis to bone and lymph nodes -- he hasn't been able to get his palliative radiation due to being hospitalized -- he is on chemo treatments per oncology -- goal of care is to resume chemo and radiation --  anticipating he will go home with hospice -- family requested oncology consult and [facility] reached out to me for permission and I was happy to have them consult and weigh in to give family more information - appreciate med/onc consult    GERD  -- pantoprazole  for GI protection    Urinary retention  -- foley cath was changed on admission -- tamsulosin  daily ordered   Protein calorie malnutrition  -- agree with meal supplementation as ordered    Goal of care -- long discussion with family on 12/7 about patient's precipitous decline in last months especially after recent CVA. --continued GOC with palliative medicine>>transition to comfort measures on 12/10 -palliative medicine continued to follow and continued adjustments of medications to promote comfort - 06/07/2024 evening--patient started on morphine  drip>> continue.  Give additional boluses to manage distress and pain.      Procedures: none  Consultations: palliative medicine  The results of significant diagnostics from this hospitalization (including imaging, microbiology, ancillary and laboratory) are listed below for reference.   Significant Diagnostic Studies: DG Swallowing Func-Speech Pathology Result Date: 06/05/2024 Table formatting from the original result was not included. Modified Barium Swallow Study Patient Details Name: Dean Randolph MRN: [id number] Date of Birth: 1940-10-06 Today's Date: 06/05/2024 HPI/PMH: HPI: 84 y.o. 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 He had recent admission 11/29-12/4 with findings of new stroke. Initial plan was to discharge to CIR, however, insurance declined to pay. He was discharged home with PT/OT and had quick return due to respiratory symptoms. BSE completed yesterday with recommendation for MBS. Clinical  Impression: Clinical Impression: MBSS completed during acute stay and Pt was assessed in the lateral position with barium tinged thin, nectar, honey, puree, regular textures, and a barium tablet in puree. Pt presents with mi/mod oropharyngeal phase dysphagia with suspected esophageal component. Oral phase is marked by reduced labial closure and mild lingual weakness with residual post swallow; pharyngeal phase is marked by swallow trigger after filling the valleculae with thins, min reduced tonge base retraction and hyolaryngeal excursion, and incomplete epiglottic deflection resulting in residuals in the valleculae which then spill to the lateral channels and pyriforms, and trace penetration of thins after the swallow. Pt was noted to have aspiration of secretions mixed with barium after he consumes honey thick liquids followed by puree and regular textures. Pt with congested cough and phlegm tinged with barium was noted to fall beneath the vocal folds and then expelled with a cough and swallowed. The barium tablet was presented in puree and briefly stopped in the valleculae before traversiing the esophagus and delayed in what appeared to be a hiatal hernia (no radiologist present to confirm). Min backflow of barium noted during esophageal sweep. Recommend D3/mech soft and thin liquids via cup/straw sips when Pt is alert and upright and swallow 2-3x for each bite/sip, pills whole in puree, and remain upright for ~30 minutes post meals for reflux precautions. SLP will follow during acute stay to review recommendations with Pt and family. Factors that may increase risk of adverse event in presence of aspiration Noe & Lianne 2021): Factors that  may increase risk of adverse event in presence of aspiration Noe & Lianne 2021): Frail or deconditioned Recommendations/Plan: Swallowing Evaluation Recommendations Swallowing Evaluation Recommendations Recommendations: PO diet PO Diet Recommendation: Dysphagia 3  (Mechanical soft); Thin liquids (Level 0) Liquid Administration via: Straw; Cup Medication Administration: Whole meds with puree Supervision: Patient able to self-feed; Intermittent supervision/cueing for swallowing strategies Swallowing strategies  : Slow rate; Small bites/sips; Multiple dry swallows after each bite/sip Postural changes: Position pt fully upright for meals; Stay upright 30-60 min after meals Oral care recommendations: Oral care BID (2x/day); Staff/trained caregiver to provide oral care Treatment Plan Treatment Plan Treatment recommendations: Therapy as outlined in treatment plan below Follow-up recommendations: Follow physicians's recommendations for discharge plan and follow up therapies Functional status assessment: Patient has had a recent decline in their functional status and demonstrates the ability to make significant improvements in function in a reasonable and predictable amount of time. Treatment frequency: Min 2x/week Treatment duration: 1 week Interventions: Aspiration precaution training; Compensatory techniques; Patient/family education; Diet toleration management by SLP Recommendations Recommendations for follow up therapy are one component of a multi-disciplinary discharge planning process, led by the attending physician.  Recommendations may be updated based on patient status, additional functional criteria and insurance authorization. Assessment: Orofacial Exam: Orofacial Exam Oral Cavity: Oral Hygiene: WFL Oral Cavity - Dentition: Missing dentition Orofacial Anatomy: WFL Oral Motor/Sensory Function: WFL Anatomy: Anatomy: WFL Boluses Administered: Boluses Administered Boluses Administered: Thin liquids (Level 0); Mildly thick liquids (Level 2, nectar thick); Moderately thick liquids (Level 3, honey thick); Puree; Solid  Oral Impairment Domain: Oral Impairment Domain Lip Closure: Escape progressing to mid-chin Tongue control during bolus hold: Escape to lateral buccal cavity/floor  of mouth Bolus preparation/mastication: Slow prolonged chewing/mashing with complete recollection Bolus transport/lingual motion: Delayed initiation of tongue motion (oral holding) Oral residue: Trace residue lining oral structures Location of oral residue : Tongue Initiation of pharyngeal swallow : Valleculae  Pharyngeal Impairment Domain: Pharyngeal Impairment Domain Soft palate elevation: Trace column of contrast or air between SP and PW Laryngeal elevation: Partial superior movement of thyroid  cartilage/partial approximation of arytenoids to epiglottic petiole Anterior hyoid excursion: Partial anterior movement Epiglottic movement: Partial inversion Laryngeal vestibule closure: Incomplete, narrow column air/contrast in laryngeal vestibule Pharyngeal stripping wave : Present - complete Pharyngeal contraction (A/P view only): N/A Pharyngoesophageal segment opening: Complete distension and complete duration, no obstruction of flow Tongue base retraction: Narrow column of contrast or air between tongue base and PPW Pharyngeal residue: Collection of residue within or on pharyngeal structures Location of pharyngeal residue: Valleculae; Pyriform sinuses; Tongue base  Esophageal Impairment Domain: Esophageal Impairment Domain Esophageal clearance upright position: Esophageal retention with retrograde flow below pharyngoesophageal segment (PES) Pill: Pill Consistency administered: Puree Puree: Impaired (see clinical impressions) Penetration/Aspiration Scale Score: Penetration/Aspiration Scale Score 1.  Material does not enter airway: Puree; Solid; Pill; Mildly thick liquids (Level 2, nectar thick) 3.  Material enters airway, remains ABOVE vocal cords and not ejected out: Thin liquids (Level 0) 6.  Material enters airway, passes BELOW cords then ejected out: Moderately thick liquids (Level 3, honey thick) (see impression statement- mixed with secretions/phlegm) Compensatory Strategies: Compensatory Strategies Compensatory  strategies: Yes Multiple swallows: Effective Effective Multiple Swallows: Thin liquid (Level 0); Mildly thick liquid (Level 2, nectar thick); Moderately thick liquid (Level 3, honey thick); Puree   General Information: Caregiver present: No  Diet Prior to this Study: Dysphagia 2 (finely chopped); Thin liquids (Level 0)   Temperature : Normal   Respiratory Status: [facility]  Supplemental O2: None (Room air)   History of Recent Intubation: No  Behavior/Cognition: Alert; Cooperative Self-Feeding Abilities: Able to self-feed Baseline vocal quality/speech: Normal Volitional Cough: Able to elicit Volitional Swallow: Able to elicit Exam Limitations: No limitations (some limitations with shoulder) Goal Planning: Prognosis for improved oropharyngeal function: Good No data recorded No data recorded Patient/Family Stated Goal: return home healthy Consulted and agree with results and recommendations: Patient Pain: Pain Assessment Pain Assessment: No/denies pain End of Session: Start Time:SLP Start Time (ACUTE ONLY): 1312 Stop Time: SLP Stop Time (ACUTE ONLY): 1342 Time Calculation:SLP Time Calculation (min) (ACUTE ONLY): 30 min Charges: SLP Evaluations $ SLP Speech Visit: 1 Visit SLP Evaluations $BSS Swallow: 1 Procedure $MBS Swallow: 1 Procedure SLP visit diagnosis: SLP Visit Diagnosis: Dysphagia, oropharyngeal phase (R13.12) Past Medical History: Past Medical History: Diagnosis Date  Bladder cancer (HCC)   Hypertension  Past Surgical History: Past Surgical History: Procedure Laterality Date  BILIARY DILATION  03/12/2022  Procedure: BILIARY DILATION;  Surgeon: Mansouraty, Aloha Raddle., MD;  Location: [facility];  Service: Gastroenterology;;  BILIARY STENT PLACEMENT N/A 02/06/2021  Procedure: BILIARY STENT PLACEMENT;  Surgeon: Golda Claudis PENNER, MD;  Location: [facility];  Service: Gastroenterology;  Laterality: N/A;  BILIARY STENT PLACEMENT N/A 03/26/2021  Procedure: BILIARY STENT PLACEMENT 10 FRENCH BY 9cm;  Surgeon: Golda Claudis PENNER,  MD;  Location: [facility];  Service: Endoscopy;  Laterality: N/A;  BILIARY STENT PLACEMENT N/A 01/19/2022  Procedure: BILIARY STENT PLACEMENT;  Surgeon: Wilhelmenia Aloha Raddle., MD;  Location: [facility];  Service: Gastroenterology;  Laterality: N/A;  BIOPSY  01/19/2022  Procedure: BIOPSY;  Surgeon: Wilhelmenia Aloha Raddle., MD;  Location: [facility];  Service: Gastroenterology;;  CATARACT EXTRACTION Bilateral   CHOLECYSTECTOMY N/A 04/25/2021  Procedure: LAPAROSCOPIC CHOLECYSTECTOMY;  Surgeon: Mavis Anes, MD;  Location: [facility];  Service: General;  Laterality: N/A;  ENDOSCOPIC RETROGRADE CHOLANGIOPANCREATOGRAPHY (ERCP) WITH PROPOFOL  N/A 01/19/2022  Procedure: ENDOSCOPIC RETROGRADE CHOLANGIOPANCREATOGRAPHY (ERCP) WITH PROPOFOL ;  Surgeon: Wilhelmenia Aloha Raddle., MD;  Location: [facility];  Service: Gastroenterology;  Laterality: N/A;  ENDOSCOPIC RETROGRADE CHOLANGIOPANCREATOGRAPHY (ERCP) WITH PROPOFOL  N/A 03/12/2022  Procedure: ENDOSCOPIC RETROGRADE CHOLANGIOPANCREATOGRAPHY (ERCP) WITH PROPOFOL ;  Surgeon: Wilhelmenia Aloha Raddle., MD;  Location: [facility];  Service: Gastroenterology;  Laterality: N/A;  ERCP N/A 02/06/2021  Procedure: ENDOSCOPIC RETROGRADE CHOLANGIOPANCREATOGRAPHY (ERCP);  Surgeon: Golda Claudis PENNER, MD;  Location: [facility];  Service: Gastroenterology;  Laterality: N/A;  ERCP N/A 03/26/2021  Procedure: ENDOSCOPIC RETROGRADE CHOLANGIOPANCREATOGRAPHY (ERCP) with EXTENDED SPHINCTEROTOMY;  Surgeon: Golda Claudis PENNER, MD;  Location: [facility];  Service: Endoscopy;  Laterality: N/A;  ERCP N/A 10/27/2021  Procedure: ENDOSCOPIC RETROGRADE CHOLANGIOPANCREATOGRAPHY (ERCP);  Surgeon: Golda Claudis PENNER, MD;  Location: [facility];  Service: Endoscopy;  Laterality: N/A;  no site   GASTROINTESTINAL STENT REMOVAL N/A 03/26/2021  Procedure: GASTROINTESTINAL STENT REMOVAL;  Surgeon: Golda Claudis PENNER, MD;  Location: [facility];  Service: Endoscopy;  Laterality: N/A;  GASTROINTESTINAL STENT REMOVAL N/A 10/27/2021  Procedure:  GASTROINTESTINAL STENT EXCHANGE;  Surgeon: Golda Claudis PENNER, MD;  Location: [facility];  Service: Endoscopy;  Laterality: N/A;  no site   REMOVAL OF STONES N/A 03/26/2021  Procedure: REMOVAL OF STONES WITH EXTRACTION BASKET;  Surgeon: Golda Claudis PENNER, MD;  Location: [facility];  Service: Endoscopy;  Laterality: N/A;  REMOVAL OF STONES N/A 10/27/2021  Procedure: REMOVAL OF STONES;  Surgeon: Golda Claudis PENNER, MD;  Location: [facility];  Service: Endoscopy;  Laterality: N/A;  REMOVAL OF STONES  01/19/2022  Procedure: REMOVAL OF STONES;  Surgeon: Wilhelmenia Aloha Raddle., MD;  Location: [facility];  Service: Gastroenterology;;  REMOVAL OF STONES  03/12/2022  Procedure: REMOVAL OF STONES;  Surgeon: Wilhelmenia Aloha Raddle., MD;  Location: [facility];  Service: Gastroenterology;;  ANNETT N/A 02/06/2021  Procedure: ANNETT;  Surgeon: Golda Claudis PENNER, MD;  Location: [facility];  Service: Gastroenterology;  Laterality: N/A;  SPHINCTEROTOMY N/A 10/27/2021  Procedure: SPHINCTEROTOMY;  Surgeon: Golda Claudis PENNER, MD;  Location: [facility];  Service: Endoscopy;  Laterality: N/A;  SPYGLASS CHOLANGIOSCOPY N/A 01/19/2022  Procedure: SPYGLASS CHOLANGIOSCOPY;  Surgeon: Wilhelmenia Aloha Raddle., MD;  Location: [facility];  Service: Gastroenterology;  Laterality: N/A;  SPYGLASS CHOLANGIOSCOPY N/A 03/12/2022  Procedure: DEBHOJDD CHOLANGIOSCOPY;  Surgeon: Wilhelmenia Aloha Raddle., MD;  Location: [facility];  Service: Gastroenterology;  Laterality: N/A;  SPYGLASS LITHOTRIPSY N/A 03/12/2022  Procedure: DEBHOJDD LITHOTRIPSY;  Surgeon: Wilhelmenia Aloha Raddle., MD;  Location: [facility];  Service: Gastroenterology;  Laterality: N/A;  STENT REMOVAL  01/19/2022  Procedure: STENT REMOVAL;  Surgeon: Wilhelmenia Aloha Raddle., MD;  Location: [facility];  Service: Gastroenterology;;  CLEDA REMOVAL  03/12/2022  Procedure: STENT REMOVAL;  Surgeon: Wilhelmenia Aloha Raddle., MD;  Location: [facility];  Service: Gastroenterology;;  STONE EXTRACTION  WITH BASKET  01/19/2022  Procedure: STONE EXTRACTION WITH BASKET;  Surgeon: Wilhelmenia Aloha Raddle., MD;  Location: [facility];  Service: Gastroenterology;; Thank you, Lamar Candy, CCC-SLP [phone] PORTER,DABNEY 06/05/2024, 2:46 PM  DG CHEST PORT 1 VIEW Result Date: 06/05/2024 CLINICAL DATA:  Aspiration pneumonia. EXAM: PORTABLE CHEST 1 VIEW COMPARISON:  06/02/2024 FINDINGS: Cardiopericardial silhouette is at upper limits of normal for size. Interstitial with diffuse patchy bilateral airspace disease is similar to prior. No substantial pleural effusion. Telemetry leads overlie the chest. IMPRESSION: No substantial interval change. Diffuse patchy bilateral airspace disease compatible with asymmetric edema or diffuse infection. Electronically Signed   By: Camellia Candle M.D.   On: 06/05/2024 05:47   DG Chest Port 1 View if patient is in a treatment room. Result Date: 06/02/2024 CLINICAL DATA:  Cough, sepsis, short of breath EXAM: PORTABLE CHEST 1 VIEW COMPARISON:  05/21/2024 FINDINGS: Single frontal view of the chest demonstrates a stable cardiac silhouette. Lung volumes are diminished, with continued pulmonary vascular congestion. Interval development of patchy bilateral airspace disease greatest at the left lung base, which may reflect edema or infection. No effusion or pneumothorax. No acute bony abnormalities. IMPRESSION: 1. Low lung volumes, with vascular congestion and patchy bilateral airspace disease left greater than right. Findings may reflect worsening edema or infection. Electronically Signed   By: Ozell Daring M.D.   On: 06/02/2024 20:31   ECHOCARDIOGRAM COMPLETE Result Date: 05/28/2024    ECHOCARDIOGRAM REPORT   Patient Name:   ISMEAL HEIDER Date of Exam: 05/28/2024 Medical Rec #:  [id number]       Height:       69.0 in Accession #:    [id number]      Weight:       144.0 lb Date of Birth:  [birth year]      BSA:          1.797 m Patient Age:    83 years        BP:           142/75 mmHg  Patient Gender: M               HR:           100 bpm. Exam Location:  [name] Procedure: 2D Echo, Cardiac Doppler and Color Doppler (Both Spectral and Color  Flow Doppler were utilized during procedure). Indications:    Stroke I63.9  History:        Patient has no prior history of Echocardiogram examinations.                 Stroke and cancer, Signs/Symptoms:Altered Mental Status; Risk                 Factors:Hypertension and Dyslipidemia.  Sonographer:    Koleen Popper RDCS Referring Phys: [phone] JACOB J STINSON IMPRESSIONS  1. Left ventricular ejection fraction, by estimation, is 55 to 60%. The left ventricle has normal function. The left ventricle has no regional wall motion abnormalities. Left ventricular diastolic parameters were normal.  2. Right ventricular systolic function is normal. The right ventricular size is normal.  3. The mitral valve is normal in structure. No evidence of mitral valve regurgitation. No evidence of mitral stenosis.  4. The aortic valve is tricuspid. There is mild calcification of the aortic valve. There is mild thickening of the aortic valve. Aortic valve regurgitation is not visualized. Aortic valve sclerosis/calcification is present, without any evidence of aortic stenosis. FINDINGS  Left Ventricle: Left ventricular ejection fraction, by estimation, is 55 to 60%. The left ventricle has normal function. The left ventricle has no regional wall motion abnormalities. The left ventricular internal cavity size was normal in size. There is  no left ventricular hypertrophy. Left ventricular diastolic parameters were normal. Right Ventricle: The right ventricular size is normal. No increase in right ventricular wall thickness. Right ventricular systolic function is normal. Left Atrium: Left atrial size was normal in size. Right Atrium: Right atrial size was normal in size. Pericardium: There is no evidence of pericardial effusion. Mitral Valve: The mitral valve is normal in  structure. Mild mitral annular calcification. No evidence of mitral valve regurgitation. No evidence of mitral valve stenosis. Tricuspid Valve: The tricuspid valve is normal in structure. Tricuspid valve regurgitation is not demonstrated. Aortic Valve: The aortic valve is tricuspid. There is mild calcification of the aortic valve. There is mild thickening of the aortic valve. Aortic valve regurgitation is not visualized. Aortic valve sclerosis/calcification is present, without any evidence of aortic stenosis. Pulmonic Valve: The pulmonic valve was grossly normal. Pulmonic valve regurgitation is not visualized. No evidence of pulmonic stenosis. Aorta: The aortic root and ascending aorta are structurally normal, with no evidence of dilitation. IAS/Shunts: No atrial level shunt detected by color flow Doppler.  LEFT VENTRICLE PLAX 2D LVIDd:         3.80 cm   Diastology LVIDs:         2.70 cm   LV e' medial:    5.22 cm/s LV PW:         1.00 cm   LV E/e' medial:  11.3 LV IVS:        1.10 cm   LV e' lateral:   8.49 cm/s LVOT diam:     1.80 cm   LV E/e' lateral: 7.0 LV SV:         43 LV SV Index:   24 LVOT Area:     2.54 cm  RIGHT VENTRICLE RV S prime:     20.10 cm/s TAPSE (M-mode): 2.0 cm LEFT ATRIUM           Index LA diam:      3.20 cm 1.78 cm/m LA Vol (A2C): 27.4 ml 15.25 ml/m  AORTIC VALVE LVOT Vmax:   106.00 cm/s LVOT Vmean:  65.200 cm/s LVOT VTI:  0.169 m  AORTA Ao Root diam: 3.10 cm Ao Asc diam:  3.20 cm MITRAL VALVE MV Area (PHT): 4.49 cm     SHUNTS MV Decel Time: 169 msec     Systemic VTI:  0.17 m MV E velocity: 59.10 cm/s   Systemic Diam: 1.80 cm MV A velocity: 109.00 cm/s MV E/A ratio:  0.54 Mihai Croitoru MD Electronically signed by Jerel Balding MD Signature Date/Time: 05/28/2024/2:10:34 PM    Final    CT ANGIO HEAD NECK W WO CM Result Date: 05/28/2024 EXAM: CTA HEAD AND NECK WITHOUT AND WITH 05/28/2024 09:09:58 AM TECHNIQUE: CTA of the head and neck was performed without and with the administration  of 75 mL of intravenous iohexol  (OMNIPAQUE ) 350 MG/ML injection. Multiplanar 2D and/or 3D reformatted images are provided for review. Automated exposure control, iterative reconstruction, and/or weight based adjustment of the mA/kV was utilized to reduce the radiation dose to as low as reasonably achievable. Stenosis of the internal carotid arteries measured using NASCET criteria. COMPARISON: CT and MRI of the head dated 05/27/2024. Recent CT angiogram of the chest performed 05/21/2024. CLINICAL HISTORY: Neuro deficit, acute, stroke suspected. FINDINGS: CTA NECK: AORTIC ARCH AND ARCH VESSELS: There is mild calcific plaque within the aortic arch. No dissection or arterial injury. No significant stenosis of the brachiocephalic or subclavian arteries. CERVICAL CAROTID ARTERIES: There is calcific and noncalcific plaque present within the origin of the right internal carotid artery, particularly along the posterior wall, with approximately 40% luminal stenosis. The cervical segment of the right internal carotid artery is otherwise normal in caliber. There is calcific and noncalcific plaque within the posterolateral origin of the left internal carotid artery, with less than 10% luminal stenosis. The remainder of the cervical segment is normal in caliber. No dissection or arterial injury. CERVICAL VERTEBRAL ARTERIES: The left vertebral artery is dominant. Both vertebral arteries are normal in caliber throughout their respective courses. No dissection, arterial injury, or significant stenosis. LUNGS AND MEDIASTINUM: Mediastinal lymphadenopathy, better demonstrated on recent CT angiogram of the chest performed 05/21/2024. SOFT TISSUES: No cervical lymphadenopathy. The patient is status post bilateral lens replacement. No acute abnormality. BONES: No acute abnormality. CTA HEAD: ANTERIOR CIRCULATION: There is mild calcific plaque within the carotid siphons and communicating segments of the internal carotid arteries, with mild  stenosis less than 30%. No significant stenosis of the anterior cerebral arteries. No significant stenosis of the middle cerebral arteries. No aneurysm. POSTERIOR CIRCULATION: There is moderate luminal irregularity of the P1 segment of the left posterior cerebral artery with moderate stenosis. There is mild-to-moderate stenosis of the P2 segment of the right posterior cerebral artery. No significant stenosis of the basilar artery. No significant stenosis of the vertebral arteries. No aneurysm. OTHER: Age-related atrophy and mild-to-moderate periventricular white matter disease. No dural venous sinus thrombosis on this non-dedicated study. IMPRESSION: 1. Moderate luminal irregularity of the P1 segment of the left posterior cerebral artery with moderate stenosis. 2. Mild-to-moderate stenosis of the P2 segment of the right posterior cerebral artery. 3. Calcific and noncalcific plaque within the origin of the right internal carotid artery, particularly along the posterior wall, with approximately 40% luminal stenosis. The cervical segment is otherwise normal in caliber. 4. Calcific and noncalcific plaque within the posterolateral origin of the left internal carotid artery, with less than 10% luminal stenosis. The remainder of the cervical segment is normal in caliber. 5. Mild calcific plaque within the carotid siphons and communicating segments of the internal carotid arteries, with mild stenosis less than  30%. Electronically signed by: Evalene Coho MD 05/28/2024 09:58 AM EST RP Workstation: [id number]   MR Brain W and Wo Contrast Result Date: 05/27/2024 EXAM: MRI BRAIN WITH AND WITHOUT CONTRAST 05/27/2024 03:01:24 PM TECHNIQUE: Multiplanar multisequence MRI of the head/brain was performed with and without the administration of intravenous contrast. 6 mL (gadobutrol  (GADAVIST ) 1 MMOL/ML injection 6 mL GADOBUTROL  1 MMOL/ML IV SOLN). COMPARISON: Head CT 05/27/2024. CLINICAL HISTORY: Headache, history of cancer.  FINDINGS: The examination is intermittently mildly to moderately motion degraded. BRAIN AND VENTRICLES: A 7 mm linear focus of mildly restricted diffusion involving posterior left occipital cortex and a 3 mm focus of mildly restricted diffusion involving right occipital subcortical white matter are consistent with acute to early subacute infarcts. Chronic microhemorrhages are noted in the left thalamus. T2 hyperintensities in the cerebral white matter bilaterally are nonspecific but compatible with mild chronic small vessel ischemic disease. There are chronic lacunar infarcts in the basal ganglia bilaterally. No mass, midline shift, hydrocephalus, extra axial fluid collection, or abnormal intracranial enhancement is identified. There is moderate cerebral atrophy. Major intracranial vascular flow voids are preserved. ORBITS: Bilateral cataract extraction. SINUSES: No acute abnormality. BONES AND SOFT TISSUES: Normal bone marrow signal and enhancement. No acute soft tissue abnormality. IMPRESSION: 1. Small acute to early subacute bilateral occipital infarcts. 2. Mild chronic small vessel ischemic disease and moderate cerebral atrophy. 3. No evidence of intracranial metastatic disease. Electronically signed by: Dasie Hamburg MD 05/27/2024 03:46 PM EST RP Workstation: [id number]   CT Head Wo Contrast Result Date: 05/27/2024 EXAM: CT HEAD WITHOUT CONTRAST 05/27/2024 12:58:28 PM TECHNIQUE: CT of the head was performed without the administration of intravenous contrast. Automated exposure control, iterative reconstruction, and/or weight based adjustment of the mA/kV was utilized to reduce the radiation dose to as low as reasonably achievable. COMPARISON: None available. CLINICAL HISTORY: Mental status change, unknown cause. FINDINGS: BRAIN AND VENTRICLES: No acute hemorrhage. No evidence of acute infarct. No hydrocephalus. No extra-axial collection. No mass effect or midline shift. There is age-related atrophy and  mild-to-moderate periventricular white matter disease. There is moderate calcific atheromatous disease within the carotid siphons. ORBITS: No acute abnormality. Patient is status post bilateral lens replacement. SINUSES: No acute abnormality. SOFT TISSUES AND SKULL: No acute soft tissue abnormality. No skull fracture. IMPRESSION: 1. No acute intracranial abnormality. 2. Age-related atrophy and mild-to-moderate periventricular white matter disease. 3. Moderate calcific atheromatous disease within the carotid siphons. Electronically signed by: Evalene Coho MD 05/27/2024 01:02 PM EST RP Workstation: [id number]   CT ABDOMEN PELVIS W CONTRAST Result Date: 05/22/2024 EXAM: CT ABDOMEN AND PELVIS WITH CONTRAST 05/21/2024 11:55:59 PM TECHNIQUE: CT of the abdomen and pelvis was performed with the administration of 75 mL of iohexol  (OMNIPAQUE ) 350 MG/ML injection. Multiplanar reformatted images are provided for review. Automated exposure control, iterative reconstruction, and/or weight-based adjustment of the mA/kV was utilized to reduce the radiation dose to as low as reasonably achievable. COMPARISON: 03/19/2024 CLINICAL HISTORY: workup for pyelo on the right side. Flank pain. Known metastatic disease. FINDINGS: LOWER CHEST: See chest CT report today. LIVER: Right hepatic cyst is unchanged. No suspicious focal hepatic abnormality. GALLBLADDER AND BILE DUCTS: Prior cholecystectomy. Pneumobilia is present. No biliary ductal dilatation. SPLEEN: No acute abnormality. PANCREAS: No acute abnormality. ADRENAL GLANDS: No acute abnormality. KIDNEYS, URETERS AND BLADDER: Left renal mass again noted measuring 3.9 x 3.2 cm compared to 4.1 x 3.4 cm previously. Enlarging left upper pole renal mass now measuring 3.1 x 2.6 cm compared to 1.8 x 1.3 cm  previously. Perinephric metastatic nodules noted along the anterior perirenal fascia are again noted, unchanged. No stones in the kidneys or ureters. Small benign-appearing cysts in the  right kidney are unchanged. No evidence of pyelonephritis. No hydronephrosis. No perinephric or periureteral stranding. Foley catheter within the bladder, which is decompressed. GI AND BOWEL: Stomach demonstrates no acute abnormality. There is no bowel obstruction. PERITONEUM AND RETROPERITONEUM: Extensive retroperitoneal adenopathy is unchanged. Retrocrural adenopathy is stable. Peritoneal metastases inferior to the left kidney measures 4.4 x 3.6 cm compared to 5.2 x 4.9 cm previously. No ascites. No free air. VASCULATURE: Aorta is normal in caliber. Aortic atherosclerosis. LYMPH NODES: Extensive retroperitoneal adenopathy is unchanged. Retrocrural adenopathy is stable. REPRODUCTIVE ORGANS: Mildly enlarged prostate. BONES AND SOFT TISSUES: Lytic lesions noted at T11, L1, and L2. Pelvic metastases seen on prior PET CT not as well visualized on today's CT. Right ischial tuberosity lytic lesion is unchanged. No focal soft tissue abnormality. IMPRESSION: 1. Enhancing left mid-pole renal mass is again noted, not significantly changed. Enlarging enhancing mass in the upper pole of the left kidney compatible with second primary or metastatic focus. 2. Peritoneal nodules along the anterior pararenal fascia and inferior to the left kidney stable or slightly smaller since prior study. 3. Retrocrural and retroperitoneal adenopathy not significantly changed. 4. No acute abnormality within the right kidney. 5. Worsening osseous metastatic disease in the lower thoracic and upper lumbar spine. Electronically signed by: Franky Crease MD 05/22/2024 12:20 AM EST RP Workstation: [id number]   CT Angio Chest PE W and/or Wo Contrast Result Date: 05/22/2024 EXAM: CTA CHEST AORTA 05/21/2024 11:55:59 PM TECHNIQUE: CTA of the chest was performed after the administration of 75 mL of iohexol  (OMNIPAQUE ) 350 MG/ML injection. Multiplanar reformatted images are provided for review. MIP images are provided for review. Automated exposure  control, iterative reconstruction, and/or weight based adjustment of the mA/kV was utilized to reduce the radiation dose to as low as reasonably achievable. COMPARISON: 03/19/2024, PET CT 03/30/2024. CLINICAL HISTORY: rule out PE/infiltrate. FINDINGS: AORTA: No thoracic aortic dissection. No aneurysm. MEDIASTINUM: Mediastinal adenopathy again noted. Subcarinal adenopathy is enlarged with a short axis diameter of 2.2 cm compared to 1.7 cm previously. Large right paratracheal mass has a short axis diameter of 3.5 cm compared to 3.4 cm, not significantly changed. Prevascular lymph node has a short axis diameter of 1.8 cm compared to 1.6 cm previously. Bilateral hilar adenopathy stable. The heart and pericardium demonstrate no acute abnormality. LYMPH NODES: No axillary lymphadenopathy. Mediastinal and hilar adenopathy as described above. LUNGS AND PLEURA: Numerous bilateral pulmonary metastases again noted, the largest in the right lower lobe measuring 12 x 11 mm compared to 13 x 9 mm previously. Overall pulmonary metastatic disease similar to prior study. Trace bilateral pleural effusions with bibasilar atelectasis. No pneumothorax. UPPER ABDOMEN: Stable moderate-sized hiatal hernia. SOFT TISSUES AND BONES: Right lateral 5th and 6th rib fractures noted, new since prior study. No acute soft tissue abnormality. IMPRESSION: 1. No evidence of pulmonary embolism. 2. Mediastinal and bilateral hilar adenopathy , mostly unchanged. However, subcarinal lymph nodes have enlarged since the prior study. 3. Numerous bilateral pulmonary metastases, overall similar to prior study. 4. Right lateral 5th and 6th rib fractures, new since prior study. Electronically signed by: Franky Crease MD 05/22/2024 12:08 AM EST RP Workstation: [id number]   DG Chest Portable 1 View Result Date: 05/21/2024 CLINICAL DATA:  Right-sided chest pain EXAM: PORTABLE CHEST 1 VIEW COMPARISON:  05/01/2024 FINDINGS: Single frontal view of the chest  demonstrates an enlarged  cardiac silhouette. Lung volumes are diminished, with increased central pulmonary vascular prominence. Increased density at the left lung base consistent with consolidation and/or effusion. No pneumothorax. No acute bony abnormalities. IMPRESSION: 1. Increased density at the left lung base consistent with consolidation and/or effusion. 2. Low lung volumes, with increased central pulmonary vascular congestion. 3. Enlarged cardiac silhouette. Electronically Signed   By: Ozell Daring M.D.   On: 05/21/2024 22:26    Microbiology: Recent Results (from the past 240 hours)  Culture, blood (Routine x 2)     Status: None   Collection Time: 06/02/24  8:22 PM   Specimen: Right Antecubital; Blood  Result Value Ref Range Status   Specimen Description RIGHT ANTECUBITAL  Final   Special Requests   Final    BOTTLES DRAWN AEROBIC AND ANAEROBIC Blood Culture adequate volume   Culture   Final    NO GROWTH 5 DAYS Performed at [facility], [address] [place], [place] [address]    Report Status 06/07/2024 FINAL  Final  Urine Culture     Status: Abnormal (Preliminary result)   Collection Time: 06/02/24  8:22 PM   Specimen: Urine, Random  Result Value Ref Range Status   Specimen Description   Final    URINE, RANDOM Performed at [facility], [address] [place], [place] [address]    Special Requests   Final    NONE Reflexed from [id number] Performed at [facility], [address] [place], [place] [address]    Culture (A)  Final    >=100,000 COLONIES/mL STENOTROPHOMONAS MALTOPHILIA Sent to [facility] for further susceptibility testing. Performed at [facility], [address] [place], [place] [address]    Report Status PENDING  Incomplete  Susceptibility, Aer + Anaerob     Status: Abnormal   Collection Time: 06/02/24  8:22 PM  Result Value Ref Range Status   Suscept, Aer + Anaerob Final report (A)  Corrected    Comment: (NOTE) Performed At: [facility] [address] [place], [place] [id number] Jennette Shorter MD Ey:[id number] CORRECTED ON 17-Jun-2024 AT 0936: PREVIOUSLY REPORTED AS Preliminary report    Source STENOTROPHOMONAS MALTOPHILIA/ URINE  Corrected    Comment: Performed at [facility], [address] [place], [place] [address] CORRECTED ON 12/08 AT 0844: PREVIOUSLY REPORTED AS URINE    Susceptibility Result     Status: Abnormal   Collection Time: 06/02/24  8:22 PM  Result Value Ref Range Status   Suscept Result 1 Comment (A)  Final    Comment: (NOTE) Stenotrophomonas maltophilia Identification performed by account, not confirmed by this laboratory. Testing performed by broth microdilution.    Antimicrobial Suscept Comment  Corrected    Comment: (NOTE)      ** S = Susceptible; I = Intermediate; R = Resistant **                   P = Positive; N = Negative            MICS are expressed in micrograms per mL   Antibiotic                 RSLT#1    RSLT#2    RSLT#3    RSLT#4 Ceftazidime                    S Levofloxacin                    S Minocycline  S Trimethoprim /Sulfa              S Performed At: [facility] [address] [place], [place] [id number] Jennette Shorter MD Ey:[id number]   Culture, blood (Routine x 2)     Status: None   Collection Time: 06/02/24  8:42 PM   Specimen: BLOOD RIGHT ARM  Result Value Ref Range Status   Specimen Description BLOOD RIGHT ARM  Final   Special Requests   Final    BOTTLES DRAWN AEROBIC AND ANAEROBIC Blood Culture adequate volume   Culture   Final    NO GROWTH 5 DAYS Performed at [facility], [address] [place], [place] [address]    Report Status 06/07/2024 FINAL  Final  Culture, blood (Routine X 2) w Reflex to ID Panel     Status: None (Preliminary result)   Collection Time: 06/06/24  5:10 AM   Specimen: BLOOD  Result Value Ref Range Status   Specimen Description BLOOD RIGHT ANTECUBITAL  Final   Special Requests   Final    BOTTLES  DRAWN AEROBIC AND ANAEROBIC Blood Culture adequate volume   Culture   Final    NO GROWTH 3 DAYS Performed at [facility], [address], [place], [place] [address]    Report Status PENDING  Incomplete  Culture, blood (Routine X 2) w Reflex to ID Panel     Status: None (Preliminary result)   Collection Time: 06/06/24  5:12 AM   Specimen: BLOOD  Result Value Ref Range Status   Specimen Description BLOOD LEFT ANTECUBITAL  Final   Special Requests   Final    BOTTLES DRAWN AEROBIC AND ANAEROBIC Blood Culture adequate volume   Culture   Final    NO GROWTH 3 DAYS Performed at [facility], [address] [place], [place] [address]    Report Status PENDING  Incomplete  MRSA Next Gen by PCR, Nasal     Status: None   Collection Time: 06/06/24  6:46 AM   Specimen: Nasal Mucosa; Nasal Swab  Result Value Ref Range Status   MRSA by PCR Next Gen NOT DETECTED NOT DETECTED Final    Comment: (NOTE) The GeneXpert MRSA Assay (FDA approved for NASAL specimens only), is one component of a comprehensive MRSA colonization surveillance program. It is not intended to diagnose MRSA infection nor to guide or monitor treatment for MRSA infections. Test performance is not FDA approved in patients less than 80 years old. Performed at [facility], [address] [place], [place] [address]       Signed: Alm Schneider, MD

## 2024-06-29 DEATH — deceased

## 2024-08-09 ENCOUNTER — Ambulatory Visit
